# Patient Record
Sex: Female | Born: 1935
Health system: Southern US, Community
[De-identification: ages and names within clinical notes are randomized; demographics above are authoritative.]

## PROBLEM LIST (undated history)

## (undated) DIAGNOSIS — K5790 Diverticulosis of intestine, part unspecified, without perforation or abscess without bleeding: Secondary | ICD-10-CM

## (undated) DIAGNOSIS — T7840XA Allergy, unspecified, initial encounter: Secondary | ICD-10-CM

## (undated) DIAGNOSIS — M2550 Pain in unspecified joint: Secondary | ICD-10-CM

## (undated) DIAGNOSIS — R519 Headache, unspecified: Secondary | ICD-10-CM

## (undated) DIAGNOSIS — R350 Frequency of micturition: Secondary | ICD-10-CM

## (undated) DIAGNOSIS — R011 Cardiac murmur, unspecified: Secondary | ICD-10-CM

## (undated) DIAGNOSIS — M5030 Other cervical disc degeneration, unspecified cervical region: Secondary | ICD-10-CM

## (undated) DIAGNOSIS — K219 Gastro-esophageal reflux disease without esophagitis: Secondary | ICD-10-CM

## (undated) DIAGNOSIS — G4700 Insomnia, unspecified: Secondary | ICD-10-CM

## (undated) DIAGNOSIS — M549 Dorsalgia, unspecified: Secondary | ICD-10-CM

## (undated) DIAGNOSIS — G629 Polyneuropathy, unspecified: Secondary | ICD-10-CM

## (undated) DIAGNOSIS — I209 Angina pectoris, unspecified: Secondary | ICD-10-CM

## (undated) DIAGNOSIS — M51369 Other intervertebral disc degeneration, lumbar region without mention of lumbar back pain or lower extremity pain: Secondary | ICD-10-CM

## (undated) DIAGNOSIS — I10 Essential (primary) hypertension: Secondary | ICD-10-CM

## (undated) DIAGNOSIS — K589 Irritable bowel syndrome without diarrhea: Secondary | ICD-10-CM

## (undated) DIAGNOSIS — F419 Anxiety disorder, unspecified: Secondary | ICD-10-CM

## (undated) DIAGNOSIS — Z860100 Personal history of colon polyps, unspecified: Secondary | ICD-10-CM

## (undated) DIAGNOSIS — Z8711 Personal history of peptic ulcer disease: Secondary | ICD-10-CM

## (undated) DIAGNOSIS — M2540 Effusion, unspecified joint: Secondary | ICD-10-CM

## (undated) DIAGNOSIS — Z8709 Personal history of other diseases of the respiratory system: Secondary | ICD-10-CM

## (undated) DIAGNOSIS — F32A Depression, unspecified: Secondary | ICD-10-CM

## (undated) DIAGNOSIS — N898 Other specified noninflammatory disorders of vagina: Secondary | ICD-10-CM

## (undated) DIAGNOSIS — E039 Hypothyroidism, unspecified: Secondary | ICD-10-CM

## (undated) DIAGNOSIS — F329 Major depressive disorder, single episode, unspecified: Secondary | ICD-10-CM

## (undated) DIAGNOSIS — Z8719 Personal history of other diseases of the digestive system: Secondary | ICD-10-CM

## (undated) DIAGNOSIS — G8929 Other chronic pain: Secondary | ICD-10-CM

## (undated) DIAGNOSIS — R51 Headache: Secondary | ICD-10-CM

## (undated) DIAGNOSIS — R42 Dizziness and giddiness: Secondary | ICD-10-CM

## (undated) DIAGNOSIS — H269 Unspecified cataract: Secondary | ICD-10-CM

## (undated) DIAGNOSIS — Z972 Presence of dental prosthetic device (complete) (partial): Secondary | ICD-10-CM

## (undated) DIAGNOSIS — R3915 Urgency of urination: Secondary | ICD-10-CM

## (undated) DIAGNOSIS — R351 Nocturia: Secondary | ICD-10-CM

## (undated) DIAGNOSIS — M5136 Other intervertebral disc degeneration, lumbar region: Secondary | ICD-10-CM

## (undated) DIAGNOSIS — C801 Malignant (primary) neoplasm, unspecified: Secondary | ICD-10-CM

## (undated) HISTORY — PX: TONSILLECTOMY: SUR1361

## (undated) HISTORY — DX: Essential (primary) hypertension: I10

## (undated) HISTORY — DX: Hypothyroidism, unspecified: E03.9

## (undated) HISTORY — DX: Gastro-esophageal reflux disease without esophagitis: K21.9

## (undated) HISTORY — DX: Other specified noninflammatory disorders of vagina: N89.8

## (undated) HISTORY — PX: ESOPHAGOGASTRODUODENOSCOPY: SHX1529

## (undated) HISTORY — DX: Other intervertebral disc degeneration, lumbar region: M51.36

## (undated) HISTORY — DX: Other intervertebral disc degeneration, lumbar region without mention of lumbar back pain or lower extremity pain: M51.369

## (undated) HISTORY — DX: Allergy, unspecified, initial encounter: T78.40XA

## (undated) HISTORY — PX: OTHER SURGICAL HISTORY: SHX169

## (undated) HISTORY — DX: Anxiety disorder, unspecified: F41.9

## (undated) HISTORY — PX: HERNIA REPAIR: SHX51

## (undated) HISTORY — DX: Irritable bowel syndrome, unspecified: K58.9

## (undated) HISTORY — DX: Angina pectoris, unspecified: I20.9

## (undated) HISTORY — DX: Polyneuropathy, unspecified: G62.9

## (undated) HISTORY — DX: Malignant (primary) neoplasm, unspecified: C80.1

## (undated) HISTORY — DX: Other cervical disc degeneration, unspecified cervical region: M50.30

---
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
## 1961-04-07 HISTORY — PX: OVARIAN CYST SURGERY: SHX726

## 1966-04-07 HISTORY — PX: ABDOMINAL HYSTERECTOMY: SHX81

## 1998-09-26 HISTORY — PX: CARDIAC CATHETERIZATION: SHX172

## 1999-10-28 ENCOUNTER — Encounter: Payer: Self-pay | Admitting: Internal Medicine

## 1999-12-17 ENCOUNTER — Other Ambulatory Visit: Admission: RE | Admit: 1999-12-17 | Discharge: 1999-12-17 | Payer: Self-pay | Admitting: Family Medicine

## 1999-12-24 ENCOUNTER — Encounter: Payer: Self-pay | Admitting: Internal Medicine

## 2000-01-27 ENCOUNTER — Encounter: Payer: Self-pay | Admitting: Internal Medicine

## 2000-03-10 ENCOUNTER — Encounter: Payer: Self-pay | Admitting: Internal Medicine

## 2000-03-10 LAB — CONVERTED CEMR LAB: TSH: 7.08 microintl units/mL

## 2000-04-15 ENCOUNTER — Encounter: Payer: Self-pay | Admitting: Internal Medicine

## 2000-04-27 ENCOUNTER — Encounter: Payer: Self-pay | Admitting: Internal Medicine

## 2000-04-27 LAB — CONVERTED CEMR LAB: TSH: 1.896 microintl units/mL

## 2000-10-02 ENCOUNTER — Encounter: Payer: Self-pay | Admitting: Family Medicine

## 2000-10-22 ENCOUNTER — Encounter: Payer: Self-pay | Admitting: Internal Medicine

## 2000-10-22 LAB — CONVERTED CEMR LAB: Blood Glucose, Fasting: 92 mg/dL

## 2000-11-26 ENCOUNTER — Encounter: Payer: Self-pay | Admitting: Internal Medicine

## 2001-01-15 ENCOUNTER — Encounter: Payer: Self-pay | Admitting: Internal Medicine

## 2002-02-09 ENCOUNTER — Encounter: Payer: Self-pay | Admitting: Internal Medicine

## 2002-02-09 LAB — CONVERTED CEMR LAB: TSH: 0.81 microintl units/mL

## 2002-06-24 ENCOUNTER — Encounter: Payer: Self-pay | Admitting: Internal Medicine

## 2002-08-05 ENCOUNTER — Ambulatory Visit (HOSPITAL_COMMUNITY): Admission: RE | Admit: 2002-08-05 | Discharge: 2002-08-05 | Payer: Self-pay | Admitting: Internal Medicine

## 2002-08-05 ENCOUNTER — Encounter: Payer: Self-pay | Admitting: Internal Medicine

## 2002-09-07 ENCOUNTER — Encounter: Payer: Self-pay | Admitting: Internal Medicine

## 2003-02-27 ENCOUNTER — Other Ambulatory Visit: Payer: Self-pay

## 2003-12-15 ENCOUNTER — Encounter: Payer: Self-pay | Admitting: Internal Medicine

## 2003-12-15 LAB — CONVERTED CEMR LAB: Blood Glucose, Fasting: 93 mg/dL

## 2004-02-07 ENCOUNTER — Ambulatory Visit: Payer: Self-pay | Admitting: Anesthesiology

## 2004-03-19 ENCOUNTER — Ambulatory Visit: Payer: Self-pay | Admitting: Internal Medicine

## 2004-03-19 LAB — CONVERTED CEMR LAB: TSH: 0.68 microintl units/mL

## 2004-03-21 ENCOUNTER — Ambulatory Visit: Payer: Self-pay | Admitting: Internal Medicine

## 2004-06-05 HISTORY — PX: COLOSTOMY W/ RECTOCELE REPAIR: SUR278

## 2004-06-20 ENCOUNTER — Ambulatory Visit: Payer: Self-pay | Admitting: Anesthesiology

## 2004-09-24 ENCOUNTER — Ambulatory Visit: Payer: Self-pay | Admitting: Anesthesiology

## 2004-09-24 ENCOUNTER — Ambulatory Visit: Payer: Self-pay | Admitting: Internal Medicine

## 2004-12-05 ENCOUNTER — Ambulatory Visit: Payer: Self-pay | Admitting: Internal Medicine

## 2004-12-05 LAB — CONVERTED CEMR LAB: TSH: 0.56 microintl units/mL

## 2004-12-24 ENCOUNTER — Ambulatory Visit: Payer: Self-pay | Admitting: Anesthesiology

## 2005-02-06 ENCOUNTER — Ambulatory Visit: Payer: Self-pay | Admitting: Internal Medicine

## 2005-05-05 ENCOUNTER — Ambulatory Visit: Payer: Self-pay | Admitting: Internal Medicine

## 2005-06-11 ENCOUNTER — Ambulatory Visit: Payer: Self-pay | Admitting: Internal Medicine

## 2005-09-02 ENCOUNTER — Ambulatory Visit: Payer: Self-pay | Admitting: Internal Medicine

## 2005-09-02 LAB — CONVERTED CEMR LAB: TSH: 1.41 microintl units/mL

## 2005-10-31 ENCOUNTER — Ambulatory Visit: Payer: Self-pay | Admitting: Internal Medicine

## 2006-01-06 ENCOUNTER — Ambulatory Visit: Payer: Self-pay | Admitting: Internal Medicine

## 2006-04-28 ENCOUNTER — Encounter: Payer: Self-pay | Admitting: Internal Medicine

## 2006-05-05 ENCOUNTER — Ambulatory Visit: Payer: Self-pay | Admitting: Otolaryngology

## 2006-06-17 ENCOUNTER — Encounter: Payer: Self-pay | Admitting: Internal Medicine

## 2006-06-18 ENCOUNTER — Encounter: Payer: Self-pay | Admitting: Internal Medicine

## 2006-07-07 ENCOUNTER — Encounter: Payer: Self-pay | Admitting: Internal Medicine

## 2006-07-14 ENCOUNTER — Encounter: Payer: Self-pay | Admitting: Internal Medicine

## 2006-07-16 ENCOUNTER — Encounter: Payer: Self-pay | Admitting: Internal Medicine

## 2006-09-08 ENCOUNTER — Encounter: Payer: Self-pay | Admitting: Internal Medicine

## 2006-10-06 ENCOUNTER — Encounter: Payer: Self-pay | Admitting: Internal Medicine

## 2006-11-09 ENCOUNTER — Telehealth (INDEPENDENT_AMBULATORY_CARE_PROVIDER_SITE_OTHER): Payer: Self-pay | Admitting: *Deleted

## 2007-01-07 ENCOUNTER — Ambulatory Visit: Payer: Self-pay | Admitting: Internal Medicine

## 2007-02-11 ENCOUNTER — Telehealth (INDEPENDENT_AMBULATORY_CARE_PROVIDER_SITE_OTHER): Payer: Self-pay | Admitting: *Deleted

## 2007-02-16 ENCOUNTER — Emergency Department: Payer: Self-pay | Admitting: Unknown Physician Specialty

## 2007-02-16 ENCOUNTER — Other Ambulatory Visit: Payer: Self-pay

## 2007-02-16 ENCOUNTER — Telehealth: Payer: Self-pay | Admitting: Internal Medicine

## 2007-02-17 ENCOUNTER — Telehealth: Payer: Self-pay | Admitting: Internal Medicine

## 2007-02-23 ENCOUNTER — Ambulatory Visit: Payer: Self-pay | Admitting: Internal Medicine

## 2007-02-23 DIAGNOSIS — M8580 Other specified disorders of bone density and structure, unspecified site: Secondary | ICD-10-CM | POA: Insufficient documentation

## 2007-02-23 DIAGNOSIS — K2100 Gastro-esophageal reflux disease with esophagitis, without bleeding: Secondary | ICD-10-CM | POA: Insufficient documentation

## 2007-02-23 DIAGNOSIS — J309 Allergic rhinitis, unspecified: Secondary | ICD-10-CM | POA: Insufficient documentation

## 2007-02-23 DIAGNOSIS — F411 Generalized anxiety disorder: Secondary | ICD-10-CM | POA: Insufficient documentation

## 2007-02-23 DIAGNOSIS — I10 Essential (primary) hypertension: Secondary | ICD-10-CM | POA: Insufficient documentation

## 2007-02-23 DIAGNOSIS — E039 Hypothyroidism, unspecified: Secondary | ICD-10-CM | POA: Insufficient documentation

## 2007-02-26 ENCOUNTER — Encounter: Payer: Self-pay | Admitting: Internal Medicine

## 2007-03-17 ENCOUNTER — Encounter: Admission: RE | Admit: 2007-03-17 | Discharge: 2007-03-17 | Payer: Self-pay | Admitting: Internal Medicine

## 2007-03-19 ENCOUNTER — Encounter (INDEPENDENT_AMBULATORY_CARE_PROVIDER_SITE_OTHER): Payer: Self-pay | Admitting: *Deleted

## 2007-03-25 ENCOUNTER — Ambulatory Visit: Payer: Self-pay | Admitting: Internal Medicine

## 2007-03-26 LAB — CONVERTED CEMR LAB
CO2: 31 meq/L (ref 19–32)
Eosinophils Absolute: 0.1 10*3/uL (ref 0.0–0.6)
Eosinophils Relative: 2.6 % (ref 0.0–5.0)
Glucose, Bld: 76 mg/dL (ref 70–99)
HCT: 38.5 % (ref 36.0–46.0)
Lymphocytes Relative: 45.1 % (ref 12.0–46.0)
MCHC: 34.2 g/dL (ref 30.0–36.0)
MCV: 93.4 fL (ref 78.0–100.0)
Neutro Abs: 1.7 10*3/uL (ref 1.4–7.7)
Neutrophils Relative %: 41.6 % — ABNORMAL LOW (ref 43.0–77.0)
Phosphorus: 3.4 mg/dL (ref 2.3–4.6)
Platelets: 283 10*3/uL (ref 150–400)
Potassium: 3.9 meq/L (ref 3.5–5.1)
WBC: 4.2 10*3/uL — ABNORMAL LOW (ref 4.5–10.5)

## 2007-04-08 HISTORY — PX: US ECHOCARDIOGRAPHY: HXRAD669

## 2007-04-08 HISTORY — PX: NM MYOVIEW LTD: HXRAD82

## 2007-04-12 ENCOUNTER — Telehealth (INDEPENDENT_AMBULATORY_CARE_PROVIDER_SITE_OTHER): Payer: Self-pay | Admitting: *Deleted

## 2007-04-26 ENCOUNTER — Ambulatory Visit: Payer: Self-pay | Admitting: Gynecology

## 2007-04-29 ENCOUNTER — Telehealth (INDEPENDENT_AMBULATORY_CARE_PROVIDER_SITE_OTHER): Payer: Self-pay | Admitting: *Deleted

## 2007-05-10 ENCOUNTER — Ambulatory Visit: Payer: Self-pay | Admitting: Gynecology

## 2007-05-11 ENCOUNTER — Telehealth: Payer: Self-pay | Admitting: Internal Medicine

## 2007-05-19 ENCOUNTER — Telehealth (INDEPENDENT_AMBULATORY_CARE_PROVIDER_SITE_OTHER): Payer: Self-pay | Admitting: *Deleted

## 2007-05-21 ENCOUNTER — Ambulatory Visit: Payer: Self-pay | Admitting: Internal Medicine

## 2007-05-25 ENCOUNTER — Ambulatory Visit: Payer: Self-pay | Admitting: Internal Medicine

## 2007-06-18 ENCOUNTER — Ambulatory Visit: Payer: Self-pay | Admitting: Internal Medicine

## 2007-06-24 ENCOUNTER — Encounter: Payer: Self-pay | Admitting: Internal Medicine

## 2007-06-24 ENCOUNTER — Ambulatory Visit: Payer: Self-pay

## 2007-08-05 ENCOUNTER — Telehealth: Payer: Self-pay | Admitting: Internal Medicine

## 2007-08-06 ENCOUNTER — Telehealth: Payer: Self-pay | Admitting: Internal Medicine

## 2007-08-16 ENCOUNTER — Ambulatory Visit: Payer: Self-pay | Admitting: Internal Medicine

## 2007-08-16 DIAGNOSIS — K589 Irritable bowel syndrome without diarrhea: Secondary | ICD-10-CM | POA: Insufficient documentation

## 2007-08-16 DIAGNOSIS — K219 Gastro-esophageal reflux disease without esophagitis: Secondary | ICD-10-CM | POA: Insufficient documentation

## 2007-08-17 ENCOUNTER — Telehealth: Payer: Self-pay | Admitting: Internal Medicine

## 2007-08-18 ENCOUNTER — Telehealth: Payer: Self-pay | Admitting: Internal Medicine

## 2007-08-18 ENCOUNTER — Encounter: Payer: Self-pay | Admitting: Internal Medicine

## 2007-08-18 LAB — HM COLONOSCOPY: HM Colonoscopy: NORMAL

## 2007-08-26 ENCOUNTER — Ambulatory Visit: Payer: Self-pay | Admitting: Internal Medicine

## 2007-08-26 DIAGNOSIS — Z888 Allergy status to other drugs, medicaments and biological substances status: Secondary | ICD-10-CM | POA: Insufficient documentation

## 2007-09-28 ENCOUNTER — Ambulatory Visit: Payer: Self-pay | Admitting: Internal Medicine

## 2007-09-28 DIAGNOSIS — G609 Hereditary and idiopathic neuropathy, unspecified: Secondary | ICD-10-CM | POA: Insufficient documentation

## 2007-09-29 LAB — CONVERTED CEMR LAB
Albumin: 4.1 g/dL (ref 3.5–5.2)
Basophils Absolute: 0 10*3/uL (ref 0.0–0.1)
Calcium: 9.9 mg/dL (ref 8.4–10.5)
GFR calc Af Amer: 106 mL/min
GFR calc non Af Amer: 88 mL/min
Glucose, Bld: 99 mg/dL (ref 70–99)
HCT: 39.7 % (ref 36.0–46.0)
Hemoglobin: 13.8 g/dL (ref 12.0–15.0)
MCHC: 34.8 g/dL (ref 30.0–36.0)
MCV: 91 fL (ref 78.0–100.0)
Monocytes Absolute: 0.5 10*3/uL (ref 0.1–1.0)
Neutro Abs: 1.8 10*3/uL (ref 1.4–7.7)
RDW: 12.3 % (ref 11.5–14.6)
Sodium: 138 meq/L (ref 135–145)

## 2007-10-04 ENCOUNTER — Ambulatory Visit: Payer: Self-pay | Admitting: Internal Medicine

## 2007-10-12 ENCOUNTER — Telehealth (INDEPENDENT_AMBULATORY_CARE_PROVIDER_SITE_OTHER): Payer: Self-pay | Admitting: *Deleted

## 2007-11-03 ENCOUNTER — Encounter: Payer: Self-pay | Admitting: Internal Medicine

## 2007-12-06 ENCOUNTER — Ambulatory Visit: Payer: Self-pay | Admitting: Gynecology

## 2008-01-03 ENCOUNTER — Ambulatory Visit: Payer: Self-pay | Admitting: Internal Medicine

## 2008-01-05 ENCOUNTER — Telehealth: Payer: Self-pay | Admitting: Internal Medicine

## 2008-01-06 ENCOUNTER — Telehealth: Payer: Self-pay | Admitting: Internal Medicine

## 2008-01-21 ENCOUNTER — Ambulatory Visit: Payer: Self-pay | Admitting: Internal Medicine

## 2008-03-07 ENCOUNTER — Ambulatory Visit: Payer: Self-pay | Admitting: Internal Medicine

## 2008-03-07 DIAGNOSIS — K4090 Unilateral inguinal hernia, without obstruction or gangrene, not specified as recurrent: Secondary | ICD-10-CM | POA: Insufficient documentation

## 2008-03-14 ENCOUNTER — Telehealth: Payer: Self-pay | Admitting: Internal Medicine

## 2008-04-04 ENCOUNTER — Encounter: Admission: RE | Admit: 2008-04-04 | Discharge: 2008-04-04 | Payer: Self-pay | Admitting: Gynecology

## 2008-06-01 ENCOUNTER — Telehealth: Payer: Self-pay | Admitting: Internal Medicine

## 2008-06-22 ENCOUNTER — Telehealth: Payer: Self-pay | Admitting: Internal Medicine

## 2008-06-23 ENCOUNTER — Encounter: Payer: Self-pay | Admitting: Internal Medicine

## 2008-06-27 ENCOUNTER — Telehealth: Payer: Self-pay | Admitting: Internal Medicine

## 2008-06-29 ENCOUNTER — Ambulatory Visit: Payer: Self-pay | Admitting: Internal Medicine

## 2008-08-05 HISTORY — PX: VENTRAL HERNIA REPAIR: SHX424

## 2008-08-16 ENCOUNTER — Telehealth: Payer: Self-pay | Admitting: Internal Medicine

## 2008-08-25 ENCOUNTER — Inpatient Hospital Stay (HOSPITAL_COMMUNITY): Admission: RE | Admit: 2008-08-25 | Discharge: 2008-08-30 | Payer: Self-pay | Admitting: Surgery

## 2008-09-14 ENCOUNTER — Encounter: Payer: Self-pay | Admitting: Internal Medicine

## 2008-09-27 ENCOUNTER — Ambulatory Visit: Payer: Self-pay | Admitting: Internal Medicine

## 2008-09-29 ENCOUNTER — Ambulatory Visit (HOSPITAL_COMMUNITY): Admission: RE | Admit: 2008-09-29 | Discharge: 2008-09-29 | Payer: Self-pay | Admitting: Internal Medicine

## 2008-10-10 ENCOUNTER — Telehealth: Payer: Self-pay | Admitting: Internal Medicine

## 2008-10-20 ENCOUNTER — Ambulatory Visit (HOSPITAL_COMMUNITY): Admission: RE | Admit: 2008-10-20 | Discharge: 2008-10-20 | Payer: Self-pay | Admitting: Internal Medicine

## 2008-10-26 ENCOUNTER — Telehealth: Payer: Self-pay | Admitting: Internal Medicine

## 2008-10-26 ENCOUNTER — Ambulatory Visit: Payer: Self-pay | Admitting: Internal Medicine

## 2008-11-03 ENCOUNTER — Ambulatory Visit: Payer: Self-pay | Admitting: Internal Medicine

## 2008-11-05 LAB — CONVERTED CEMR LAB
ALT: 17 units/L (ref 0–35)
AST: 25 units/L (ref 0–37)
Albumin: 4.5 g/dL (ref 3.5–5.2)
Basophils Relative: 0 % (ref 0.0–3.0)
Chloride: 100 meq/L (ref 96–112)
Eosinophils Relative: 1.6 % (ref 0.0–5.0)
Free T4: 1.3 ng/dL (ref 0.6–1.6)
HCT: 37.9 % (ref 36.0–46.0)
Hemoglobin: 13.2 g/dL (ref 12.0–15.0)
Lymphs Abs: 1.7 10*3/uL (ref 0.7–4.0)
Monocytes Relative: 9.3 % (ref 3.0–12.0)
Neutro Abs: 1.2 10*3/uL — ABNORMAL LOW (ref 1.4–7.7)
Phosphorus: 3.5 mg/dL (ref 2.3–4.6)
Potassium: 3.5 meq/L (ref 3.5–5.1)
RBC: 4.09 M/uL (ref 3.87–5.11)
RDW: 12.9 % (ref 11.5–14.6)
Total Protein: 7.7 g/dL (ref 6.0–8.3)

## 2008-11-15 ENCOUNTER — Encounter: Payer: Self-pay | Admitting: Internal Medicine

## 2008-11-29 ENCOUNTER — Encounter: Payer: Self-pay | Admitting: Internal Medicine

## 2008-11-29 ENCOUNTER — Encounter: Admission: RE | Admit: 2008-11-29 | Discharge: 2008-11-29 | Payer: Self-pay | Admitting: Obstetrics and Gynecology

## 2008-12-06 ENCOUNTER — Encounter: Payer: Self-pay | Admitting: Internal Medicine

## 2008-12-18 ENCOUNTER — Ambulatory Visit: Payer: Self-pay | Admitting: Internal Medicine

## 2008-12-19 LAB — CONVERTED CEMR LAB
Free T4: 0.9 ng/dL (ref 0.6–1.6)
TSH: 1.16 microintl units/mL (ref 0.35–5.50)

## 2009-01-04 ENCOUNTER — Ambulatory Visit: Payer: Self-pay | Admitting: Internal Medicine

## 2009-01-18 ENCOUNTER — Telehealth: Payer: Self-pay | Admitting: Internal Medicine

## 2009-03-02 ENCOUNTER — Telehealth: Payer: Self-pay | Admitting: Internal Medicine

## 2009-03-05 ENCOUNTER — Ambulatory Visit: Payer: Self-pay | Admitting: Gastroenterology

## 2009-03-05 DIAGNOSIS — R1084 Generalized abdominal pain: Secondary | ICD-10-CM | POA: Insufficient documentation

## 2009-03-05 DIAGNOSIS — K5900 Constipation, unspecified: Secondary | ICD-10-CM | POA: Insufficient documentation

## 2009-03-05 DIAGNOSIS — R197 Diarrhea, unspecified: Secondary | ICD-10-CM | POA: Insufficient documentation

## 2009-03-07 ENCOUNTER — Encounter: Payer: Self-pay | Admitting: Physician Assistant

## 2009-03-08 LAB — CONVERTED CEMR LAB
BUN: 7 mg/dL (ref 6–23)
Basophils Relative: 1.3 % (ref 0.0–3.0)
Calcium: 9.5 mg/dL (ref 8.4–10.5)
Creatinine, Ser: 0.7 mg/dL (ref 0.4–1.2)
Eosinophils Absolute: 0.1 10*3/uL (ref 0.0–0.7)
Eosinophils Relative: 1.8 % (ref 0.0–5.0)
Lymphocytes Relative: 45.2 % (ref 12.0–46.0)
Neutrophils Relative %: 42.7 % — ABNORMAL LOW (ref 43.0–77.0)
Platelets: 299 10*3/uL (ref 150.0–400.0)
RBC: 4.16 M/uL (ref 3.87–5.11)
WBC: 4.3 10*3/uL — ABNORMAL LOW (ref 4.5–10.5)

## 2009-04-05 ENCOUNTER — Encounter: Admission: RE | Admit: 2009-04-05 | Discharge: 2009-04-05 | Payer: Self-pay | Admitting: Internal Medicine

## 2009-04-05 LAB — HM MAMMOGRAPHY: HM Mammogram: NORMAL

## 2009-04-16 ENCOUNTER — Encounter: Payer: Self-pay | Admitting: Internal Medicine

## 2009-05-07 ENCOUNTER — Ambulatory Visit: Payer: Self-pay | Admitting: Internal Medicine

## 2009-05-31 ENCOUNTER — Telehealth: Payer: Self-pay | Admitting: Internal Medicine

## 2009-06-07 ENCOUNTER — Ambulatory Visit: Payer: Self-pay | Admitting: Internal Medicine

## 2009-06-07 DIAGNOSIS — N3020 Other chronic cystitis without hematuria: Secondary | ICD-10-CM | POA: Insufficient documentation

## 2009-06-07 LAB — CONVERTED CEMR LAB
Bilirubin Urine: NEGATIVE
Glucose, Urine, Semiquant: NEGATIVE
Ketones, urine, test strip: NEGATIVE
Protein, U semiquant: NEGATIVE
Specific Gravity, Urine: <1.005
Urobilinogen, UA: 0.2

## 2009-07-06 HISTORY — PX: COLONOSCOPY: SHX174

## 2009-07-11 ENCOUNTER — Telehealth: Payer: Self-pay | Admitting: Internal Medicine

## 2009-07-18 ENCOUNTER — Encounter: Payer: Self-pay | Admitting: Internal Medicine

## 2009-07-19 ENCOUNTER — Telehealth: Payer: Self-pay | Admitting: Internal Medicine

## 2009-08-16 ENCOUNTER — Ambulatory Visit: Payer: Self-pay | Admitting: Internal Medicine

## 2009-08-20 ENCOUNTER — Encounter: Payer: Self-pay | Admitting: Internal Medicine

## 2009-08-20 LAB — CONVERTED CEMR LAB
AST: 24 units/L (ref 0–37)
Albumin: 4.3 g/dL (ref 3.5–5.2)
Alkaline Phosphatase: 49 units/L (ref 39–117)
BUN: 8 mg/dL (ref 6–23)
Basophils Absolute: 0 10*3/uL (ref 0.0–0.1)
Bilirubin, Direct: 0 mg/dL (ref 0.0–0.3)
Chloride: 107 meq/L (ref 96–112)
Eosinophils Absolute: 0.1 10*3/uL (ref 0.0–0.7)
Free T4: 1 ng/dL (ref 0.6–1.6)
GFR calc non Af Amer: 96.65 mL/min (ref >60)
Glucose, Bld: 95 mg/dL (ref 70–99)
Hemoglobin: 12.9 g/dL (ref 12.0–15.0)
Lymphocytes Relative: 46 % (ref 12.0–46.0)
MCHC: 34.6 g/dL (ref 30.0–36.0)
Monocytes Relative: 7.5 % (ref 3.0–12.0)
Neutro Abs: 1.8 10*3/uL (ref 1.4–7.7)
Neutrophils Relative %: 43 % (ref 43.0–77.0)
Phosphorus: 3.2 mg/dL (ref 2.3–4.6)
Platelets: 301 10*3/uL (ref 150.0–400.0)
RDW: 14.3 % (ref 11.5–14.6)
TSH: 2.47 microintl units/mL (ref 0.35–5.50)
Total Bilirubin: 0.6 mg/dL (ref 0.3–1.2)

## 2009-08-27 ENCOUNTER — Encounter: Payer: Self-pay | Admitting: Internal Medicine

## 2009-09-07 ENCOUNTER — Telehealth: Payer: Self-pay | Admitting: Family Medicine

## 2009-09-10 ENCOUNTER — Telehealth: Payer: Self-pay | Admitting: Internal Medicine

## 2009-09-13 ENCOUNTER — Telehealth: Payer: Self-pay | Admitting: Internal Medicine

## 2009-09-19 ENCOUNTER — Telehealth: Payer: Self-pay | Admitting: Internal Medicine

## 2009-09-19 ENCOUNTER — Ambulatory Visit: Payer: Self-pay | Admitting: Internal Medicine

## 2009-09-19 DIAGNOSIS — R51 Headache: Secondary | ICD-10-CM | POA: Insufficient documentation

## 2009-09-19 DIAGNOSIS — R519 Headache, unspecified: Secondary | ICD-10-CM | POA: Insufficient documentation

## 2009-09-21 ENCOUNTER — Telehealth: Payer: Self-pay | Admitting: Internal Medicine

## 2009-09-25 ENCOUNTER — Encounter: Payer: Self-pay | Admitting: Internal Medicine

## 2009-10-15 ENCOUNTER — Encounter: Payer: Self-pay | Admitting: Internal Medicine

## 2009-10-18 ENCOUNTER — Encounter: Payer: Self-pay | Admitting: Internal Medicine

## 2009-10-24 ENCOUNTER — Ambulatory Visit: Payer: Self-pay | Admitting: Pain Medicine

## 2009-10-29 ENCOUNTER — Encounter: Payer: Self-pay | Admitting: Internal Medicine

## 2009-10-29 ENCOUNTER — Ambulatory Visit: Payer: Self-pay | Admitting: Pain Medicine

## 2009-11-08 ENCOUNTER — Ambulatory Visit: Payer: Self-pay | Admitting: Pain Medicine

## 2009-11-12 ENCOUNTER — Encounter: Payer: Self-pay | Admitting: Internal Medicine

## 2009-11-13 ENCOUNTER — Ambulatory Visit: Payer: Self-pay | Admitting: Pain Medicine

## 2009-11-13 ENCOUNTER — Telehealth: Payer: Self-pay | Admitting: Internal Medicine

## 2009-11-28 ENCOUNTER — Ambulatory Visit: Payer: Self-pay | Admitting: Pain Medicine

## 2009-12-25 ENCOUNTER — Ambulatory Visit: Payer: Self-pay | Admitting: Internal Medicine

## 2009-12-26 ENCOUNTER — Encounter: Payer: Self-pay | Admitting: Internal Medicine

## 2010-01-22 ENCOUNTER — Encounter: Payer: Self-pay | Admitting: Internal Medicine

## 2010-01-22 ENCOUNTER — Ambulatory Visit: Payer: Self-pay | Admitting: Internal Medicine

## 2010-01-22 DIAGNOSIS — R079 Chest pain, unspecified: Secondary | ICD-10-CM | POA: Insufficient documentation

## 2010-01-23 LAB — CONVERTED CEMR LAB
Albumin: 4 g/dL (ref 3.5–5.2)
Basophils Relative: 0.9 % (ref 0.0–3.0)
CO2: 31 meq/L (ref 19–32)
Calcium: 9.6 mg/dL (ref 8.4–10.5)
Creatinine, Ser: 0.7 mg/dL (ref 0.4–1.2)
Eosinophils Absolute: 0.1 10*3/uL (ref 0.0–0.7)
Eosinophils Relative: 1.1 % (ref 0.0–5.0)
Glucose, Bld: 85 mg/dL (ref 70–99)
HCT: 37 % (ref 36.0–46.0)
Lymphs Abs: 2 10*3/uL (ref 0.7–4.0)
MCHC: 34.5 g/dL (ref 30.0–36.0)
MCV: 93.2 fL (ref 78.0–100.0)
Monocytes Absolute: 0.4 10*3/uL (ref 0.1–1.0)
Neutrophils Relative %: 45.8 % (ref 43.0–77.0)
Platelets: 292 10*3/uL (ref 150.0–400.0)
Sodium: 139 meq/L (ref 135–145)
WBC: 4.5 10*3/uL (ref 4.5–10.5)

## 2010-05-07 NOTE — Progress Notes (Signed)
Summary: flonas  Phone Note Refill Request Message from:  [employer] on November 13, 2009 12:57 PM  Refills Requested: Medication #1:  flonas   Supply Requested: 1 month not on medication list    Method Requested: Electronic Initial call taken by: Benny Lennert CMA Duncan Dull),  November 13, 2009 12:57 PM  Follow-up for Phone Call        she was on this and it was removed from list in May okay to refill x 1 year 2 sprays each nostril dialy fluticasone (the generic) Follow-up by: Cindee Salt MD,  November 13, 2009 1:39 PM  Additional Follow-up for Phone Call Additional follow up Details #1::        rx sent to pharmacy and med list updated Additional Follow-up by: Benny Lennert CMA Duncan Dull),  November 13, 2009 2:40 PM    New/Updated Medications: FLONASE 50 MCG/ACT SUSP (FLUTICASONE PROPIONATE) 2 sprays in each nostril daily Prescriptions: FLONASE 50 MCG/ACT SUSP (FLUTICASONE PROPIONATE) 2 sprays in each nostril daily  #1 x 11   Entered by:   Benny Lennert CMA (AAMA)   Authorized by:   Cindee Salt MD   Signed by:   Benny Lennert CMA (AAMA) on 11/13/2009   Method used:   Electronically to        [facility]. [address] [phone]* (retail)       [address] [place], [place]  [id number]       Ph: [phone]       Fax: [phone]   RxID:   [id number]   Prior Medications: BUSPAR 15 MG TABS (BUSPIRONE HCL) Take 1 tablet by mouth two times a day GABAPENTIN 300 MG CAPS (GABAPENTIN) take 2 tablet in AM and PM, 1 tab in midday PANTOPRAZOLE SODIUM 40 MG  TBEC (PANTOPRAZOLE SODIUM) 1 two times a day LOMOTIL 2.5-0.025 MG  TABS (DIPHENOXYLATE-ATROPINE) take 1 tablet before meals as needed ESTRACE 0.5 MG TABS (ESTRADIOL) 1 tablet by mouth once daily AMLODIPINE BESYLATE 5 MG TABS (AMLODIPINE BESYLATE) Take one tablet by mouth daily FEXOFENADINE HCL 180 MG TABS (FEXOFENADINE HCL) take 1 tablet by mouth once daily LEVOXYL 75 MCG TABS (LEVOTHYROXINE SODIUM) take 1 by  mouth once daily BENTYL 10 MG CAPS (DICYCLOMINE HCL) Take 1 tab 3-4 times daily for bloating, cramping, spasms TRIAMTERENE-HCTZ 75-50 MG TABS (TRIAMTERENE-HCTZ) take 1/2 by mouth once daily CENTRUM SILVER   TABS (MULTIPLE VITAMINS-MINERALS) Take 1 tablet by mouth once a day CALCIUM-VITAMIN D 250-125 MG-UNIT TABS (CALCIUM CARBONATE-VITAMIN D) take 1 by mouth once daily GI COCKTAIL () 10 CC by mouth every 4 hours as needed for pain/esophageal burning RANITIDINE HCL 150 MG CAPS (RANITIDINE HCL) take 1 by mouth two times a day ALIGN  CAPS (MISC INTESTINAL FLORA REGULAT) 1 by mouth once daily CHOLESTYRAMINE 4 GM PACK (CHOLESTYRAMINE) take 1 small scoop in the a.m. VITAMIN B-12 1000 MCG  TABS (CYANOCOBALAMIN) take 1 tablet once a day by mouth IMODIUM ADVANCED 2-125 MG  TABS (LOPERAMIDE-SIMETHICONE) as needed TYLENOL 325 MG  TABS (ACETAMINOPHEN) as needed ASTRAGALUS ROOT  POWD (TRAGACANTH) 500 mg daily by mouth MECLIZINE HCL 25 MG TABS (MECLIZINE HCL) 1 tab by mouth three times a day for headache and balance problems Current Allergies: ! * METRONIDAZOLE ACEON CODEINE PHOSPHATE (CODEINE PHOSPHATE) * PREDNISONE AMOXICILLIN (AMOXICILLIN) TRAMADOL HCL (TRAMADOL HCL) TRILEPTAL (OXCARBAZEPINE)

## 2010-05-07 NOTE — Letter (Signed)
[facility]   Imported By: Lanelle Bal 01/10/2010 13:51:23  _____________________________________________________________________  External Attachment:    Type:   Image     Comment:   External Document  Appended Document: [facility] diarrhea  better reflux worse--regimen intensified and planning EGD

## 2010-05-07 NOTE — Progress Notes (Signed)
Summary: change maxzide dose  Phone Note From Pharmacy   Caller: [facility] [address] #[phone] Summary of Call: Pharmacy is asking to change pts maxzide dose to 75/50, current dose of 37.5/25 is not available. Initial call taken by: Lowella Petties CMA,  September 13, 2009 12:57 PM  Follow-up for Phone Call        okay to change to higher dose 1/2 tab daily Follow-up by: Cindee Salt MD,  September 13, 2009 12:59 PM  Additional Follow-up for Phone Call Additional follow up Details #1::        Prescription resent Additional Follow-up by: DeShannon Katrinka Blazing CMA Duncan Dull),  September 13, 2009 4:01 PM    New/Updated Medications: TRIAMTERENE-HCTZ 75-50 MG TABS (TRIAMTERENE-HCTZ) take 1/2 by mouth once daily Prescriptions: TRIAMTERENE-HCTZ 75-50 MG TABS (TRIAMTERENE-HCTZ) take 1/2 by mouth once daily  #30 x 6   Entered by:   Mervin Hack CMA (AAMA)   Authorized by:   Cindee Salt MD   Signed by:   Mervin Hack CMA (AAMA) on 09/13/2009   Method used:   Electronically to        [facility]. [address] [phone]* (retail)       [address] [place], [place]  [address]       Ph: [phone]       Fax: [phone]   RxID:   [id number]

## 2010-05-07 NOTE — Miscellaneous (Signed)
  Clinical Lists Changes  Medications: Changed medication from AMLODIPINE BESYLATE 10 MG TABS (AMLODIPINE BESYLATE) 1/2  tablet by mouth once daily to AMLODIPINE BESYLATE 5 MG TABS (AMLODIPINE BESYLATE) Take one tablet by mouth daily - Signed Rx of AMLODIPINE BESYLATE 5 MG TABS (AMLODIPINE BESYLATE) Take one tablet by mouth daily;  #30 x 12;  Signed;  Entered by: Kem Parkinson;  Authorized by: Sherrill Raring, MD, [facility];  Method used: Electronically to [facility]. [facility] [phone]*, [address] [place], [place]  [address], Ph: [phone], Fax: [phone]    Prescriptions: AMLODIPINE BESYLATE 5 MG TABS (AMLODIPINE BESYLATE) Take one tablet by mouth daily  #30 x 12   Entered by:   Kem Parkinson   Authorized by:   Sherrill Raring, MD, [facility]   Signed by:   Kem Parkinson on 08/20/2009   Method used:   Electronically to        [facility]. [address] [phone]* (retail)       [address] [place], [place]  [address]       Ph: [phone]       Fax: [phone]   RxID:   [id number]

## 2010-05-07 NOTE — Assessment & Plan Note (Signed)
Summary: 2:00 ?URI/CLE   Vital Signs:  Patient profile:   76 year old female Weight:      144 pounds Temp:     98.2 degrees F oral Resp:     18 per minute BP sitting:   122 / 62  (left arm) Cuff size:   regular  Vitals Entered By: Mervin Hack CMA Duncan Dull) (May 07, 2009 2:06 PM) CC: cold   History of Present Illness: Having "bronchial" bug again (every January) Started with sore throat 1 week ago Cough with deep green sputum seems to be from chest Mild fever but no sweats or shakes at night Really bad night 2 nights ago No SOB  some nasal drainage--with PND No ear pain but they itch  Trying mucinex DM and nasal spray and zinc----?some help  Allergies: 1)  ! * Metronidazole 2)  Aceon (Perindopril Erbumine) 3)  Codeine Phosphate (Codeine Phosphate) 4)  * Prednisone 5)  Amoxicillin (Amoxicillin) 6)  Tramadol Hcl (Tramadol Hcl) 7)  Trileptal (Oxcarbazepine)  Past History:  Past medical, surgical, family and social histories (including risk factors) reviewed for relevance to current acute and chronic problems.  Past Medical History: Reviewed history from 09/27/2008 and no changes required. Allergic rhinitis Anxiety Hypertension Osteopenia Irritable bowel syndrome Hypothyroidism Gastritis GERD  Past Surgical History: Reviewed history from 03/05/2009 and no changes required. T&A:(1943) APPENDECTOMY:(1948) CYSTS ON OVARIES:(1963) HYSTERECTOMY / LEFT OVARY REMAINS /ENDOMETRIOSIS:(1968) HEART CATH (SUBCRITICAL CAD):(09/26/1998) BX. DYSPLASIA /CERVICAL CUFF/ 11/09/1998 DYSPLASIA LASER SURGERY:12/03/1998 DEXA : OSTEOPENIA:(01/2000) ECHO; TRACE MR:( 12/2000) EGD:(06/2002) LUMBAR SYMPATHETIC BLOCK RECTUM HERNIA REPAIR (03/2005) CYSTOCELE/RECTOCELE REPAIR:(06/2004) ABDOMINAL WALL VENTRAL HERNIA REPAIR AND LYSIS OF ADHESIONS 5/10  Family History: Reviewed history from 01/21/2008 and no changes required. Dad :Suicide Mom died @94 :COPD Brother died of 2  strokes, MI No HTN, DM No breast or colon cancer No FH of Colon Cancer:  Social History: Reviewed history from 08/16/2007 and no changes required. Widowed 1997 1 daughter, 4 sons Former Smoker Alcohol use-occ Daily Caffeine Use 6 cups qd  Review of Systems       Slight loose stools Nausea last night after eating soup---still able to eat some though Slept well last night with 3 benedryl  Physical Exam  General:  alert.  NAD Head:  no frontal or maxillary tenderness Ears:  R ear normal and L ear normal.   Nose:  moderate congestion and inflammation Mouth:  no erythema and no exudates.   Neck:  supple, no masses, and no cervical lymphadenopathy.   Lungs:  normal respiratory effort, no intercostal retractions, no accessory muscle use, normal breath sounds, no crackles, and no wheezes.     Impression & Recommendations:  Problem # 1:  BRONCHITIS- ACUTE (ICD-466.0) Assessment New seems to be bacterial with purulent mucus has trouble with most antibiotics  except sulfa will try this analgesics  only  Her updated medication list for this problem includes:    Sulfamethoxazole-tmp Ds 800-160 Mg Tabs (Sulfamethoxazole-trimethoprim) .Marland Kitchen... 2 tabs at onset of bladder symptoms    Sulfamethoxazole-tmp Ds 800-160 Mg Tabs (Sulfamethoxazole-trimethoprim) .Marland Kitchen... 1 tab by mouth two times a day for bronchitis  Complete Medication List: 1)  Buspar 15 Mg Tabs (Buspirone hcl) .... Take 1 tablet by mouth two times a day 2)  Gabapentin 300 Mg Caps (Gabapentin) .... Take 2 tablet two times a day by mouth 3)  Vitamin B-12 1000 Mcg Tabs (Cyanocobalamin) .... Take 1 tablet once a day by mouth 4)  Pantoprazole Sodium 40 Mg Tbec (Pantoprazole sodium) .Marland KitchenMarland KitchenMarland Kitchen 1  two times a day 5)  Sulfamethoxazole-tmp Ds 800-160 Mg Tabs (Sulfamethoxazole-trimethoprim) .... 2 tabs at onset of bladder symptoms 6)  Triamterene-hctz 37.5-25 Mg Tabs (Triamterene-hctz) .... Take 1 tablet by mouth once a day 7)  Centrum Silver  Tabs (Multiple vitamins-minerals) .... Take 1 tablet by mouth once a day 8)  Lomotil 2.5-0.025 Mg Tabs (Diphenoxylate-atropine) .... Take 1 tablet before meals as needed 9)  Imodium Advanced 2-125 Mg Tabs (Loperamide-simethicone) .... As needed 10)  Tylenol 325 Mg Tabs (Acetaminophen) .... As needed 11)  Gi Cocktail  .Marland Kitchen.. 10 cc by mouth q 4 hours as needed for pain/esophageal burning 12)  Estrace 0.5 Mg Tabs (Estradiol) .Marland Kitchen.. 1 tablet by mouth once daily 13)  Benadryl 25 Mg Caps (Diphenhydramine hcl) .Marland Kitchen.. 1 capsule by mouth at bedtime 14)  Amlodipine Besylate 10 Mg Tabs (Amlodipine besylate) .Marland Kitchen.. 1 tablet by mouth once daily 15)  Align Caps (Misc intestinal flora regulat) .Marland Kitchen.. 1 by mouth once daily 16)  Fexofenadine Hcl 180 Mg Tabs (Fexofenadine hcl) .... Take 1 tablet by mouth once daily 17)  Astragalus Root Powd (Tragacanth) .... 500 mg daily by mouth 18)  Fluticasone Propionate 50 Mcg/act Susp (Fluticasone propionate) .... 2 sprays in each nostril two times a day 19)  Levoxyl 75 Mcg Tabs (Levothyroxine sodium) .... Take 1 by mouth once daily 20)  Bentyl 10 Mg Caps (Dicyclomine hcl) .... Take 1 tab 3-4 times daily for bloating, cramping, spasms 21)  Calcium-vitamin D 250-125 Mg-unit Tabs (Calcium carbonate-vitamin d) .... Take 1 by mouth once daily 22)  Sulfamethoxazole-tmp Ds 800-160 Mg Tabs (Sulfamethoxazole-trimethoprim) .Marland Kitchen.. 1 tab by mouth two times a day for bronchitis  Patient Instructions: 1)  Please schedule a follow-up appointment in 3-4  months .  Prescriptions: SULFAMETHOXAZOLE-TMP DS 800-160 MG TABS (SULFAMETHOXAZOLE-TRIMETHOPRIM) 1 tab by mouth two times a day for bronchitis  #20 x 0   Entered and Authorized by:   Cindee Salt MD   Signed by:   Cindee Salt MD on 05/07/2009   Method used:   Electronically to        [facility]. [address] [phone]* (retail)       [address] [place], [place]  [id number]       Ph: [phone]       Fax: [phone]   RxID:    [id number]   Current Allergies (reviewed today): ! * METRONIDAZOLE ACEON (PERINDOPRIL ERBUMINE) CODEINE PHOSPHATE (CODEINE PHOSPHATE) * PREDNISONE AMOXICILLIN (AMOXICILLIN) TRAMADOL HCL (TRAMADOL HCL) TRILEPTAL (OXCARBAZEPINE)

## 2010-05-07 NOTE — Progress Notes (Signed)
Summary: Headache  Phone Note Call from Patient   Caller: Patient Details for Reason: Experiencing headache Summary of Call: Headache that feels like a "swelling" and hurts the last 3 days.  Pt is sweating at night which is atypical and head hurts through the night.  Not a throbbing pain and is not severe but is constant.  She is taking Tylenol with some relief.  She is scheduled for tomorrow.  Please call patient at [phone] if she needs to be seen sooner.   Initial call taken by: Clarisa Schools,  September 19, 2009 8:48 AM  Follow-up for Phone Call        have her come in at 12:30PM today Follow-up by: Cindee Salt MD,  September 19, 2009 10:01 AM  Additional Follow-up for Phone Call Additional follow up Details #1::        APPT MADE Additional Follow-up by: Mervin Hack CMA Duncan Dull),  September 19, 2009 10:07 AM

## 2010-05-07 NOTE — Letter (Signed)
[facility]   Imported By: Lanelle Bal 09/10/2009 08:16:36  _____________________________________________________________________  External Attachment:    Type:   Image     Comment:   External Document  Appended Document: [facility] cholestyramine helping fecal incontinence trying to titrate dose on zantac for acid symptoms

## 2010-05-07 NOTE — Assessment & Plan Note (Signed)
Summary: HEADACHE/DS   Vital Signs:  Patient profile:   76 year old female Weight:      144 pounds Temp:     97.6 degrees F oral Pulse rate:   64 / minute Pulse rhythm:   regular BP sitting:   130 / 70  (left arm) Cuff size:   regular  Vitals Entered By: Mervin Hack CMA Duncan Dull) (September 19, 2009 12:49 PM) CC: headache   History of Present Illness: Started with feet problems at end of MAy spent extra time walking and neuropathy flared up had to take extra neurontin (900 two times a day)  Then fell and twisted right ankle 6/4 relates to dizziness from extra neurontin still some bruising  Since then, her head hurts no pounding no sinus pressure or tenderness Feels like her head is swelling no localized tenderness constant x 2 days no obvious exacerbating symptoms better after tylenol and rest yesterday has noticed sweats at night  heartburn has been worse on zantac 150 two times a day  Bad burning then cough after eating  Back to 600 two times a day of gabapentin  does note mild balance problems   Allergies: 1)  ! * Metronidazole 2)  Aceon 3)  Codeine Phosphate (Codeine Phosphate) 4)  * Prednisone 5)  Amoxicillin (Amoxicillin) 6)  Tramadol Hcl (Tramadol Hcl) 7)  Trileptal (Oxcarbazepine)  Past History:  Past medical, surgical, family and social histories (including risk factors) reviewed for relevance to current acute and chronic problems.  Past Medical History: Reviewed history from 09/27/2008 and no changes required. Allergic rhinitis Anxiety Hypertension Osteopenia Irritable bowel syndrome Hypothyroidism Gastritis GERD  Past Surgical History: Reviewed history from 03/05/2009 and no changes required. T&A:(1943) APPENDECTOMY:(1948) CYSTS ON OVARIES:(1963) HYSTERECTOMY / LEFT OVARY REMAINS /ENDOMETRIOSIS:(1968) HEART CATH (SUBCRITICAL CAD):(09/26/1998) BX. DYSPLASIA /CERVICAL CUFF/ 11/09/1998 DYSPLASIA LASER SURGERY:12/03/1998 DEXA :  OSTEOPENIA:(01/2000) ECHO; TRACE MR:( 12/2000) EGD:(06/2002) LUMBAR SYMPATHETIC BLOCK RECTUM HERNIA REPAIR (03/2005) CYSTOCELE/RECTOCELE REPAIR:(06/2004) ABDOMINAL WALL VENTRAL HERNIA REPAIR AND LYSIS OF ADHESIONS 5/10  Family History: Reviewed history from 01/21/2008 and no changes required. Dad :Suicide Mom died @94 :COPD Brother died of 2 strokes, MI No HTN, DM No breast or colon cancer No FH of Colon Cancer:  Social History: Reviewed history from 08/16/2007 and no changes required. Widowed 1997 1 daughter, 4 sons Former Smoker Alcohol use-occ Daily Caffeine Use 6 cups qd  Review of Systems       No cough except with heartburn No SOB some blurry vision but no diplopia or amaurosis  Physical Exam  General:  alert.  NAD Eyes:  pupils equal, pupils round, pupils reactive to light, and no optic disk abnormalities.   ?slight horizontal nystagmus on right lateral gaze Mouth:  no erythema and no exudates.   Neck:  supple, full ROM, and no masses.   Neurologic:  alert & oriented X3, cranial nerves II-XII intact, strength normal in all extremities, gait normal, and Romberg negative.   Did note abnormal head sensation with EOM testing Psych:  normally interactive, good eye contact, and slightly anxious.     Impression & Recommendations:  Problem # 1:  HEADACHE (ICD-784.0) Assessment New history not typical of any type of headache has some balance issues --symptoms partially recreated with EOM testing ?vestibular etiology  P: continue tylenol    try meclizine for now  Her updated medication list for this problem includes:    Tylenol 325 Mg Tabs (Acetaminophen) .Marland Kitchen... As needed  Problem # 2:  GERD (ICD-530.81) Assessment: Deteriorated ?? affected  if vestibular system invovled in headache has seen multiple specialists for this so no changes  Her updated medication list for this problem includes:    Pantoprazole Sodium 40 Mg Tbec (Pantoprazole sodium) .Marland Kitchen... 1 two  times a day    Bentyl 10 Mg Caps (Dicyclomine hcl) .Marland Kitchen... Take 1 tab 3-4 times daily for bloating, cramping, spasms    Ranitidine Hcl 150 Mg Caps (Ranitidine hcl) .Marland Kitchen... Take 1 by mouth two times a day  Problem # 3:  NEUROPATHY (ICD-355.9) Assessment: Deteriorated may need increased gabapentin or change to another agent  Complete Medication List: 1)  Buspar 15 Mg Tabs (Buspirone hcl) .... Take 1 tablet by mouth two times a day 2)  Gabapentin 300 Mg Caps (Gabapentin) .... Take 2 tablet in am and pm, 1 tab in midday 3)  Pantoprazole Sodium 40 Mg Tbec (Pantoprazole sodium) .Marland Kitchen.. 1 two times a day 4)  Lomotil 2.5-0.025 Mg Tabs (Diphenoxylate-atropine) .... Take 1 tablet before meals as needed 5)  Estrace 0.5 Mg Tabs (Estradiol) .Marland Kitchen.. 1 tablet by mouth once daily 6)  Amlodipine Besylate 5 Mg Tabs (Amlodipine besylate) .... Take one tablet by mouth daily 7)  Fexofenadine Hcl 180 Mg Tabs (Fexofenadine hcl) .... Take 1 tablet by mouth once daily 8)  Levoxyl 75 Mcg Tabs (Levothyroxine sodium) .... Take 1 by mouth once daily 9)  Bentyl 10 Mg Caps (Dicyclomine hcl) .... Take 1 tab 3-4 times daily for bloating, cramping, spasms 10)  Triamterene-hctz 75-50 Mg Tabs (Triamterene-hctz) .... Take 1/2 by mouth once daily 11)  Centrum Silver Tabs (Multiple vitamins-minerals) .... Take 1 tablet by mouth once a day 12)  Calcium-vitamin D 250-125 Mg-unit Tabs (Calcium carbonate-vitamin d) .... Take 1 by mouth once daily 13)  Gi Cocktail  .Marland Kitchen.. 10 cc by mouth every 4 hours as needed for pain/esophageal burning 14)  Ranitidine Hcl 150 Mg Caps (Ranitidine hcl) .... Take 1 by mouth two times a day 15)  Align Caps (Misc intestinal flora regulat) .Marland Kitchen.. 1 by mouth once daily 16)  Cholestyramine 4 Gm Pack (Cholestyramine) .... Take 1 small scoop in the a.m. 17)  Vitamin B-12 1000 Mcg Tabs (Cyanocobalamin) .... Take 1 tablet once a day by mouth 18)  Imodium Advanced 2-125 Mg Tabs (Loperamide-simethicone) .... As needed 19)   Tylenol 325 Mg Tabs (Acetaminophen) .... As needed 20)  Astragalus Root Powd (Tragacanth) .... 500 mg daily by mouth 21)  Meclizine Hcl 25 Mg Tabs (Meclizine hcl) .Marland Kitchen.. 1 tab by mouth three times a day for headache and balance problems  Patient Instructions: 1)  Please try the meclizine for the headaches and then slowly wean off after things settle down 2)  Please keep October appt. If neuropathy or other symptoms aren't better, call for earlier appt Prescriptions: MECLIZINE HCL 25 MG TABS (MECLIZINE HCL) 1 tab by mouth three times a day for headache and balance problems  #90 x 1   Entered and Authorized by:   Cindee Salt MD   Signed by:   Cindee Salt MD on 09/19/2009   Method used:   Electronically to        [facility]. [address] [phone]* (retail)       [address] [place], [place]  [address]       Ph: [phone]       Fax: [phone]   RxID:   [id number]   Current Allergies (reviewed today): ! * METRONIDAZOLE ACEON CODEINE PHOSPHATE (CODEINE  PHOSPHATE) * PREDNISONE AMOXICILLIN (AMOXICILLIN) TRAMADOL HCL (TRAMADOL HCL) TRILEPTAL (OXCARBAZEPINE)

## 2010-05-07 NOTE — Assessment & Plan Note (Signed)
Summary: 6 M F/U DLO R/S FROM 08/09/09   Vital Signs:  Patient profile:   76 year old female Weight:      145 pounds Temp:     97.8 degrees F oral Pulse rate:   76 / minute Pulse rhythm:   regular BP sitting:   118 / 78  (left arm) Cuff size:   regular  Vitals Entered By: Mervin Hack CMA Duncan Dull) (Aug 16, 2009 3:39 PM) CC: 4 month follow-up   History of Present Illness: Is finally feeling better Had another colonoscopy from [facility] diarrhea better on ranitidine and cholestyramine (though she finds this hard to take) Colonoscopy was benign also on probioitc--align   Still with some heartburn--esp at night on protonix still also  BP has been fine No chest pain No SOB Has had headache on left side for 2 days--but this is not typical for her  still needs the gabapentin for her neuropathy still on estrace--hasn't tried to wean  anxiety is better now with diarrhea better still notes mood swings  Allergies: 1)  ! * Metronidazole 2)  Aceon (Perindopril Erbumine) 3)  Codeine Phosphate (Codeine Phosphate) 4)  * Prednisone 5)  Amoxicillin (Amoxicillin) 6)  Tramadol Hcl (Tramadol Hcl) 7)  Trileptal (Oxcarbazepine)  Past History:  Past medical, surgical, family and social histories (including risk factors) reviewed for relevance to current acute and chronic problems.  Past Medical History: Reviewed history from 09/27/2008 and no changes required. Allergic rhinitis Anxiety Hypertension Osteopenia Irritable bowel syndrome Hypothyroidism Gastritis GERD  Past Surgical History: Reviewed history from 03/05/2009 and no changes required. T&A:(1943) APPENDECTOMY:(1948) CYSTS ON OVARIES:(1963) HYSTERECTOMY / LEFT OVARY REMAINS /ENDOMETRIOSIS:(1968) HEART CATH (SUBCRITICAL CAD):(09/26/1998) BX. DYSPLASIA /CERVICAL CUFF/ 11/09/1998 DYSPLASIA LASER SURGERY:12/03/1998 DEXA : OSTEOPENIA:(01/2000) ECHO; TRACE MR:( 12/2000) EGD:(06/2002) LUMBAR SYMPATHETIC BLOCK RECTUM  HERNIA REPAIR (03/2005) CYSTOCELE/RECTOCELE REPAIR:(06/2004) ABDOMINAL WALL VENTRAL HERNIA REPAIR AND LYSIS OF ADHESIONS 5/10  Family History: Reviewed history from 01/21/2008 and no changes required. Dad :Suicide Mom died @94 :COPD Brother died of 2 strokes, MI No HTN, DM No breast or colon cancer No FH of Colon Cancer:  Social History: Reviewed history from 08/16/2007 and no changes required. Widowed 1997 1 daughter, 4 sons Former Smoker Alcohol use-occ Daily Caffeine Use 6 cups qd  Review of Systems       weight is stable sleeps okay appetite is fine  Physical Exam  General:  alert and normal appearance.   Neck:  supple, no masses, no thyromegaly, no carotid bruits, and no cervical lymphadenopathy.   Lungs:  normal respiratory effort and normal breath sounds.   Heart:  normal rate, regular rhythm, no murmur, and no gallop.   Abdomen:  soft and no masses.  Mild left sided tenderness Pulses:  1+ in feet Extremities:  no edema Psych:  normally interactive, good eye contact, not anxious appearing, and not depressed appearing.     Impression & Recommendations:  Problem # 1:  HYPERTENSION (ICD-401.9) Assessment Improved  doing well will have her cut amlodipine in half and stop next time if still doing well  Her updated medication list for this problem includes:    Triamterene-hctz 37.5-25 Mg Tabs (Triamterene-hctz) .Marland Kitchen... Take 1 tablet by mouth once a day    Amlodipine Besylate 10 Mg Tabs (Amlodipine besylate) .Marland Kitchen... 1/2  tablet by mouth once daily  BP today: 118/78 Prior BP: 100/70 (06/07/2009)  Labs Reviewed: K+: 3.6 (03/05/2009) Creat: : 0.7 (03/05/2009)     Orders: TLB-Renal Function Panel (80069-RENAL) TLB-CBC Platelet - w/Differential (85025-CBCD)  TLB-Hepatic/Liver Function Pnl (80076-HEPATIC)  Problem # 2:  ANXIETY (ICD-300.00) Assessment: Improved nerves better with diarrhea controlled  Her updated medication list for this problem includes:     Buspar 15 Mg Tabs (Buspirone hcl) .Marland Kitchen... Take 1 tablet by mouth two times a day  Problem # 3:  NEUROPATHY (ICD-355.9) Assessment: Unchanged okay with the gabapentin  no changes  Problem # 4:  GERD (ICD-530.81) Assessment: Unchanged ongoing symptoms despite multiple therapies has GI follow up  Her updated medication list for this problem includes:    Pantoprazole Sodium 40 Mg Tbec (Pantoprazole sodium) .Marland Kitchen... 1 two times a day    Bentyl 10 Mg Caps (Dicyclomine hcl) .Marland Kitchen... Take 1 tab 3-4 times daily for bloating, cramping, spasms    Ranitidine Hcl 150 Mg Caps (Ranitidine hcl) .Marland Kitchen... Take 1 by mouth two times a day  Problem # 5:  HYPOTHYROIDISM (ICD-244.9) Assessment: Comment Only  due for labs  Her updated medication list for this problem includes:    Levoxyl 75 Mcg Tabs (Levothyroxine sodium) .Marland Kitchen... Take 1 by mouth once daily  Labs Reviewed: TSH: 1.16 (12/18/2008)     Orders: TLB-TSH (Thyroid Stimulating Hormone) (84443-TSH) Venipuncture (66440) TLB-T4 (Thyrox), Free [phone])  Complete Medication List: 1)  Buspar 15 Mg Tabs (Buspirone hcl) .... Take 1 tablet by mouth two times a day 2)  Gabapentin 300 Mg Caps (Gabapentin) .... Take 2 tablet two times a day by mouth 3)  Pantoprazole Sodium 40 Mg Tbec (Pantoprazole sodium) .Marland Kitchen.. 1 two times a day 4)  Triamterene-hctz 37.5-25 Mg Tabs (Triamterene-hctz) .... Take 1 tablet by mouth once a day 5)  Centrum Silver Tabs (Multiple vitamins-minerals) .... Take 1 tablet by mouth once a day 6)  Lomotil 2.5-0.025 Mg Tabs (Diphenoxylate-atropine) .... Take 1 tablet before meals as needed 7)  Estrace 0.5 Mg Tabs (Estradiol) .Marland Kitchen.. 1 tablet by mouth once daily 8)  Amlodipine Besylate 10 Mg Tabs (Amlodipine besylate) .... 1/2  tablet by mouth once daily 9)  Fexofenadine Hcl 180 Mg Tabs (Fexofenadine hcl) .... Take 1 tablet by mouth once daily 10)  Levoxyl 75 Mcg Tabs (Levothyroxine sodium) .... Take 1 by mouth once daily 11)  Bentyl 10 Mg Caps  (Dicyclomine hcl) .... Take 1 tab 3-4 times daily for bloating, cramping, spasms 12)  Calcium-vitamin D 250-125 Mg-unit Tabs (Calcium carbonate-vitamin d) .... Take 1 by mouth once daily 13)  Gi Cocktail  .Marland Kitchen.. 10 cc by mouth every 4 hours as needed for pain/esophageal burning 14)  Ranitidine Hcl 150 Mg Caps (Ranitidine hcl) .... Take 1 by mouth two times a day 15)  Align Caps (Misc intestinal flora regulat) .Marland Kitchen.. 1 by mouth once daily 16)  Cholestyramine 4 Gm Pack (Cholestyramine) .... Take 1 small scoop in the a.m. 17)  Vitamin B-12 1000 Mcg Tabs (Cyanocobalamin) .... Take 1 tablet once a day by mouth 18)  Imodium Advanced 2-125 Mg Tabs (Loperamide-simethicone) .... As needed 19)  Tylenol 325 Mg Tabs (Acetaminophen) .... As needed 20)  Astragalus Root Powd (Tragacanth) .... 500 mg daily by mouth  Patient Instructions: 1)  Please cut the amlodipine in half and only take 5mg  daily 2)  Please take the estradiol (estrace) only 6 days per week. In one month, if no problems, go down to 5 days per week. Keep reducing by 1 tab a week every month 3)  Please schedule a follow-up appointment in 6 months .  Prescriptions: GABAPENTIN 300 MG CAPS (GABAPENTIN) take 2 tablet two times a day by mouth  #  120 x 12   Entered and Authorized by:   Cindee Salt MD   Signed by:   Cindee Salt MD on 08/16/2009   Method used:   Electronically to        [facility]. [address] [phone]* (retail)       [address] [place], [place]  [address]       Ph: [phone]       Fax: [phone]   RxID:   [id number] ESTRACE 0.5 MG TABS (ESTRADIOL) 1 tablet by mouth once daily  #30 x 12   Entered and Authorized by:   Cindee Salt MD   Signed by:   Cindee Salt MD on 08/16/2009   Method used:   Electronically to        [facility]. [address] [phone]* (retail)       [address] [place], [place]  [address]       Ph: [phone]       Fax: [phone]   RxID:    [id number] AMLODIPINE BESYLATE 10 MG TABS (AMLODIPINE BESYLATE) 1/2  tablet by mouth once daily  #30 x 12   Entered and Authorized by:   Cindee Salt MD   Signed by:   Cindee Salt MD on 08/16/2009   Method used:   Electronically to        [facility]. [address] [phone]* (retail)       [address] [place], [place]  [id number]       Ph: [phone]       Fax: [phone]   RxID:   [id number]   Current Allergies (reviewed today): ! * METRONIDAZOLE ACEON (PERINDOPRIL ERBUMINE) CODEINE PHOSPHATE (CODEINE PHOSPHATE) * PREDNISONE AMOXICILLIN (AMOXICILLIN) TRAMADOL HCL (TRAMADOL HCL) TRILEPTAL (OXCARBAZEPINE)

## 2010-05-07 NOTE — Assessment & Plan Note (Signed)
Summary: 2:15 ?UTI/CLE   Vital Signs:  Patient profile:   76 year old female Weight:      145 pounds Temp:     97.9 degrees F oral BP sitting:   100 / 70  (left arm) Cuff size:   regular  Vitals Entered By: Mervin Hack CMA Duncan Dull) (June 07, 2009 2:21 PM) CC: UTI   History of Present Illness: Concerns about her bladder hurting some dysuria--bad burning now IBS acting up  so much has been happening all at the same time  Did try the 2 septra DS---no help  concerned about possible yeast infection as well no discharge no real vaginal symptoms though---just has pain when urine passes  slight fever--only knows due to slight chill  Allergies: 1)  ! * Metronidazole 2)  Aceon (Perindopril Erbumine) 3)  Codeine Phosphate (Codeine Phosphate) 4)  * Prednisone 5)  Amoxicillin (Amoxicillin) 6)  Tramadol Hcl (Tramadol Hcl) 7)  Trileptal (Oxcarbazepine)  Past History:  Past medical, surgical, family and social histories (including risk factors) reviewed for relevance to current acute and chronic problems.  Past Medical History: Reviewed history from 09/27/2008 and no changes required. Allergic rhinitis Anxiety Hypertension Osteopenia Irritable bowel syndrome Hypothyroidism Gastritis GERD  Past Surgical History: Reviewed history from 03/05/2009 and no changes required. T&A:(1943) APPENDECTOMY:(1948) CYSTS ON OVARIES:(1963) HYSTERECTOMY / LEFT OVARY REMAINS /ENDOMETRIOSIS:(1968) HEART CATH (SUBCRITICAL CAD):(09/26/1998) BX. DYSPLASIA /CERVICAL CUFF/ 11/09/1998 DYSPLASIA LASER SURGERY:12/03/1998 DEXA : OSTEOPENIA:(01/2000) ECHO; TRACE MR:( 12/2000) EGD:(06/2002) LUMBAR SYMPATHETIC BLOCK RECTUM HERNIA REPAIR (03/2005) CYSTOCELE/RECTOCELE REPAIR:(06/2004) ABDOMINAL WALL VENTRAL HERNIA REPAIR AND LYSIS OF ADHESIONS 5/10  Family History: Reviewed history from 01/21/2008 and no changes required. Dad :Suicide Mom died @94 :COPD Brother died of 2 strokes, MI No  HTN, DM No breast or colon cancer No FH of Colon Cancer:  Social History: Reviewed history from 08/16/2007 and no changes required. Widowed 1997 1 daughter, 4 sons Former Smoker Alcohol use-occ Daily Caffeine Use 6 cups qd  Review of Systems       no vomiting some loose stools ---has been taking lots of immodium appetite is okay  Physical Exam  General:  alert and normal appearance.   Abdomen:  soft.  Moderate suprapubic tenderness   Impression & Recommendations:  Problem # 1:  OTHER CHRONIC CYSTITIS (ICD-595.2) Assessment New  ongoing bladder symptoms but no evidence of infection may have intersitiial cystitis not excited about other evaluation  will try to increase fluids urology eval if not improving (Cope/Humphries)  Orders: UA Dipstick w/o Micro (manual) (81191)  Complete Medication List: 1)  Buspar 15 Mg Tabs (Buspirone hcl) .... Take 1 tablet by mouth two times a day 2)  Gabapentin 300 Mg Caps (Gabapentin) .... Take 2 tablet two times a day by mouth 3)  Vitamin B-12 1000 Mcg Tabs (Cyanocobalamin) .... Take 1 tablet once a day by mouth 4)  Pantoprazole Sodium 40 Mg Tbec (Pantoprazole sodium) .Marland Kitchen.. 1 two times a day 5)  Sulfamethoxazole-tmp Ds 800-160 Mg Tabs (Sulfamethoxazole-trimethoprim) .... 2 tabs at onset of bladder symptoms 6)  Triamterene-hctz 37.5-25 Mg Tabs (Triamterene-hctz) .... Take 1 tablet by mouth once a day 7)  Centrum Silver Tabs (Multiple vitamins-minerals) .... Take 1 tablet by mouth once a day 8)  Lomotil 2.5-0.025 Mg Tabs (Diphenoxylate-atropine) .... Take 1 tablet before meals as needed 9)  Imodium Advanced 2-125 Mg Tabs (Loperamide-simethicone) .... As needed 10)  Tylenol 325 Mg Tabs (Acetaminophen) .... As needed 11)  Estrace 0.5 Mg Tabs (Estradiol) .Marland Kitchen.. 1 tablet by mouth  once daily 12)  Benadryl 25 Mg Caps (Diphenhydramine hcl) .Marland Kitchen.. 1 capsule by mouth at bedtime 13)  Amlodipine Besylate 10 Mg Tabs (Amlodipine besylate) .Marland Kitchen.. 1 tablet by  mouth once daily 14)  Align Caps (Misc intestinal flora regulat) .Marland Kitchen.. 1 by mouth once daily 15)  Fexofenadine Hcl 180 Mg Tabs (Fexofenadine hcl) .... Take 1 tablet by mouth once daily 16)  Astragalus Root Powd (Tragacanth) .... 500 mg daily by mouth 17)  Fluticasone Propionate 50 Mcg/act Susp (Fluticasone propionate) .... 2 sprays in each nostril two times a day 18)  Levoxyl 75 Mcg Tabs (Levothyroxine sodium) .... Take 1 by mouth once daily 19)  Bentyl 10 Mg Caps (Dicyclomine hcl) .... Take 1 tab 3-4 times daily for bloating, cramping, spasms 20)  Calcium-vitamin D 250-125 Mg-unit Tabs (Calcium carbonate-vitamin d) .... Take 1 by mouth once daily  Patient Instructions: 1)  Keep May appt 2)  Call in the next few weeks if bladder is not better and we can refer to a urologist  Laboratory Results   Urine Tests  Date/Time Received: June 07, 2009 2:22 PM Date/Time Reported: June 07, 2009 2:22 PM  Routine Urinalysis   Color: yellow Appearance: Clear Glucose: negative   (Normal Range: Negative) Bilirubin: negative   (Normal Range: Negative) Ketone: negative   (Normal Range: Negative) Spec. Gravity: <1.005   (Normal Range: 1.003-1.035) Blood: negative   (Normal Range: Negative) pH: 6.5   (Normal Range: 5.0-8.0) Protein: negative   (Normal Range: Negative) Urobilinogen: 0.2   (Normal Range: 0-1) Nitrite: negative   (Normal Range: Negative) Leukocyte Esterace: negative   (Normal Range: Negative)        Current Allergies (reviewed today): ! * METRONIDAZOLE ACEON (PERINDOPRIL ERBUMINE) CODEINE PHOSPHATE (CODEINE PHOSPHATE) * PREDNISONE AMOXICILLIN (AMOXICILLIN) TRAMADOL HCL (TRAMADOL HCL) TRILEPTAL (OXCARBAZEPINE)

## 2010-05-07 NOTE — Progress Notes (Signed)
Summary: neuropathy is worse  Phone Note Call from Patient Call back at Home Phone [phone]   Caller: Patient Summary of Call: Pt has neuropathy in her feet that is getting worse.  She has been doing increased amounts of walking and has had to take extra gabapentin and tylenol.  She is up to 900 mg's of neurontin two times a day and 2 ES Tylenol two times a day.  She knows Dr. Alphonsus Sias is out but she is asking if there is anything else that she can do over the week end, until Dr. Alphonsus Sias returns on monday.   States her feet burn and tingle and she has difficulty walking.  Uses [facility] [address] Initial call taken by: Lowella Petties CMA,  September 07, 2009 12:10 PM  Follow-up for Phone Call        Can take neuronitn three times a day...try 300 mg at midday. Follow up with [facility]. Refill neurontin as needed.  Follow-up by: Kerby Nora MD,  September 07, 2009 12:29 PM  Additional Follow-up for Phone Call Additional follow up Details #1::        Patient advised and will call for appt with letvak on monday Additional Follow-up by: Benny Lennert CMA Duncan Dull),  September 07, 2009 12:38 PM    New/Updated Medications: GABAPENTIN 300 MG CAPS (GABAPENTIN) take 2 tablet in AM and PM, 1 tab in midday

## 2010-05-07 NOTE — Letter (Signed)
Summary: [facility]  [facility]   Imported By: Lanelle Bal 12/12/2009 12:15:54  _____________________________________________________________________  External Attachment:    Type:   Image     Comment:   External Document  Appended Document: [facility] doing biopsy looking for small fiber neuropathy having her increase her gabapentin still getting pain shots

## 2010-05-07 NOTE — Letter (Signed)
Summary: [facility]   Imported By: Lester [place] 10/04/2009 11:54:42  _____________________________________________________________________  External Attachment:    Type:   Image     Comment:   External Document  Appended Document: [facility] changing to nexium for worsening reflux symptoms plans to review past EGDs

## 2010-05-07 NOTE — Assessment & Plan Note (Signed)
Summary: 6 M F/U DLO   Vital Signs:  Patient profile:   76 year old female Weight:      143 pounds BMI:     26.90 Temp:     98.1 degrees F oral BP sitting:   120 / 80  (left arm) Cuff size:   regular  Vitals Entered By: Mervin Hack CMA Duncan Dull) (January 22, 2010 12:27 PM) CC: 6 month follow-up   History of Present Illness: Still with ongoing problems  Diarrhea is better with the cholestyramine Reflux is worse now Did have repeat EGD---no obvious findings to explain problems on lots of zantac in addition to the protonix  Still with neuropathy problems seeing Dr Laban Emperor Had MRI done---reviewed today Did have some subsequent nerve blocks done  Angina attack on 9/24 Bad chest pain BP 173/100 took ASA BP then settled down-- finally 134/75 Never got evaluation No dyspnea, diaphoresis or nausea no recurrence since  Tried to wean off estrogen Not able to due to sevre night sweats and mood issues--constant crying  Allergies: 1)  ! * Metronidazole 2)  Aceon (Perindopril Erbumine) 3)  Codeine Phosphate (Codeine Phosphate) 4)  * Prednisone 5)  Amoxicillin (Amoxicillin) 6)  Tramadol Hcl (Tramadol Hcl) 7)  Trileptal (Oxcarbazepine)  Past History:  Past medical, surgical, family and social histories (including risk factors) reviewed for relevance to current acute and chronic problems.  Past Medical History: Reviewed history from 09/27/2008 and no changes required. Allergic rhinitis Anxiety Hypertension Osteopenia Irritable bowel syndrome Hypothyroidism Gastritis GERD  Past Surgical History: Reviewed history from 03/05/2009 and no changes required. T&A:(1943) APPENDECTOMY:(1948) CYSTS ON OVARIES:(1963) HYSTERECTOMY / LEFT OVARY REMAINS /ENDOMETRIOSIS:(1968) HEART CATH (SUBCRITICAL CAD):(09/26/1998) BX. DYSPLASIA /CERVICAL CUFF/ 11/09/1998 DYSPLASIA LASER SURGERY:12/03/1998 DEXA : OSTEOPENIA:(01/2000) ECHO; TRACE MR:( 12/2000) EGD:(06/2002) LUMBAR  SYMPATHETIC BLOCK RECTUM HERNIA REPAIR (03/2005) CYSTOCELE/RECTOCELE REPAIR:(06/2004) ABDOMINAL WALL VENTRAL HERNIA REPAIR AND LYSIS OF ADHESIONS 5/10  Family History: Reviewed history from 01/21/2008 and no changes required. Dad :Suicide Mom died @94 :COPD Brother died of 2 strokes, MI No HTN, DM No breast or colon cancer No FH of Colon Cancer:  Social History: Reviewed history from 08/16/2007 and no changes required. Widowed 1997 1 daughter, 4 sons Former Smoker Alcohol use-occ Daily Caffeine Use 6 cups qd  Review of Systems       appetite okay sleeps well weight stable has noted some hair loss--generallized  Physical Exam  General:  alert and normal appearance.   Neck:  supple, no masses, no thyromegaly, no carotid bruits, and no cervical lymphadenopathy.   Lungs:  normal respiratory effort, no intercostal retractions, no accessory muscle use, and normal breath sounds.   Heart:  normal rate, regular rhythm, and no gallop.   Soft aortic systolic murmur Abdomen:  soft and non-tender.   Pulses:  faint in both feet Extremities:  no edema Psych:  normally interactive, good eye contact, not depressed appearing, and slightly anxious.     Impression & Recommendations:  Problem # 1:  CHEST PAIN (ICD-786.50) Assessment New  EKG benign discussed that she should call 911 for any persistent CP likely GI  Orders: EKG w/ Interpretation (93000)  Problem # 2:  HYPERTENSION (ICD-401.9) Assessment: Unchanged  good control no changes needed  Her updated medication list for this problem includes:    Amlodipine Besylate 5 Mg Tabs (Amlodipine besylate) .Marland Kitchen... Take one tablet by mouth daily    Triamterene-hctz 75-50 Mg Tabs (Triamterene-hctz) .Marland Kitchen... Take 1/2 by mouth once daily  BP today: 120/80 Prior BP: 130/70 (09/19/2009)  Labs Reviewed: K+: 3.9 (08/16/2009) Creat: : 0.6 (08/16/2009)     Orders: Venipuncture (78295) TLB-Renal Function Panel (80069-RENAL) TLB-CBC  Platelet - w/Differential (85025-CBCD)  Problem # 3:  DIARRHEA (ICD-787.91) Assessment: Improved better   Her updated medication list for this problem includes:    Lomotil 2.5-0.025 Mg Tabs (Diphenoxylate-atropine) .Marland Kitchen... Take 1 tablet before meals as needed    Align Caps (Misc intestinal flora regulat) .Marland Kitchen... 1 by mouth once daily    Imodium Advanced 2-125 Mg Tabs (Loperamide-simethicone) .Marland Kitchen... As needed  Problem # 4:  NEUROPATHY (ICD-355.9) Assessment: Improved better after shots again  Problem # 5:  HYPOTHYROIDISM (ICD-244.9) Assessment: Unchanged fine on replacement  Her updated medication list for this problem includes:    Levoxyl 75 Mcg Tabs (Levothyroxine sodium) .Marland Kitchen... Take 1 by mouth once daily  Labs Reviewed: TSH: 2.47 (08/16/2009)     Complete Medication List: 1)  Flonase 50 Mcg/act Susp (Fluticasone propionate) .... 2 sprays in each nostril daily 2)  Meclizine Hcl 25 Mg Tabs (Meclizine hcl) .Marland Kitchen.. 1 tab by mouth three times a day for headache and balance problems 3)  Buspar 15 Mg Tabs (Buspirone hcl) .... Take 1 tablet by mouth two times a day 4)  Gabapentin 300 Mg Caps (Gabapentin) .... Take 2 tablet in am and pm, 1 tab in midday 5)  Pantoprazole Sodium 40 Mg Tbec (Pantoprazole sodium) .Marland Kitchen.. 1 two times a day 6)  Lomotil 2.5-0.025 Mg Tabs (Diphenoxylate-atropine) .... Take 1 tablet before meals as needed 7)  Estrace 0.5 Mg Tabs (Estradiol) .Marland Kitchen.. 1 tablet by mouth once daily 8)  Amlodipine Besylate 5 Mg Tabs (Amlodipine besylate) .... Take one tablet by mouth daily 9)  Levoxyl 75 Mcg Tabs (Levothyroxine sodium) .... Take 1 by mouth once daily 10)  Triamterene-hctz 75-50 Mg Tabs (Triamterene-hctz) .... Take 1/2 by mouth once daily 11)  Centrum Silver Tabs (Multiple vitamins-minerals) .... Take 1 tablet by mouth once a day 12)  Calcium-vitamin D 250-125 Mg-unit Tabs (Calcium carbonate-vitamin d) .... Take 1 by mouth once daily 13)  Ranitidine Hcl 150 Mg Caps (Ranitidine hcl)  .... Take 1 by mouth two times a day 14)  Align Caps (Misc intestinal flora regulat) .Marland Kitchen.. 1 by mouth once daily 15)  Cholestyramine 4 Gm Pack (Cholestyramine) .... Take 1 small scoop in the a.m. 16)  Vitamin B-12 1000 Mcg Tabs (Cyanocobalamin) .... Take 1 tablet once a day by mouth 17)  Imodium Advanced 2-125 Mg Tabs (Loperamide-simethicone) .... As needed 18)  Tylenol 325 Mg Tabs (Acetaminophen) .... As needed 19)  Astragalus Root Powd (Tragacanth) .... 500 mg daily by mouth 20)  Gi Cocktail  .Marland Kitchen.. 10 cc by mouth every 4 hours as needed for pain/esophageal burning  Patient Instructions: 1)  Please schedule a follow-up appointment in 6 months .    Orders Added: 1)  EKG w/ Interpretation [93000] 2)  Est. Patient Level IV [62130] 3)  Venipuncture [36415] 4)  TLB-Renal Function Panel [80069-RENAL] 5)  TLB-CBC Platelet - w/Differential [85025-CBCD]    Current Allergies (reviewed today): ! * METRONIDAZOLE ACEON (PERINDOPRIL ERBUMINE) CODEINE PHOSPHATE (CODEINE PHOSPHATE) * PREDNISONE AMOXICILLIN (AMOXICILLIN) TRAMADOL HCL (TRAMADOL HCL) TRILEPTAL (OXCARBAZEPINE)

## 2010-05-07 NOTE — Progress Notes (Signed)
Summary: wants referral to [facility]  Phone Note Call from Patient Call back at Home Phone [phone]   Caller: Patient Call For: Cindee Salt MD Summary of Call: Pt wants referral to [facility] for her GI problems, she wants to see Dr. Ray Church or Dr. Riccardo Dubin asap.  If she cant get immediate appt with either of these, please call her and let her know and she will get other names.  States this is urgent to her. Initial call taken by: Lowella Petties CMA,  July 11, 2009 2:26 PM  Follow-up for Phone Call        okay to make referral  Not sure how long it will take to get in Follow-up by: Cindee Salt MD,  July 11, 2009 8:38 PM  Additional Follow-up for Phone Call Additional follow up Details #1::        Appt made with Dr Fulton Reek on 07/18/2009 at 2:00pm. Additional Follow-up by: Carlton Adam,  July 13, 2009 4:14 PM

## 2010-05-07 NOTE — Assessment & Plan Note (Signed)
Summary: FLU SHOT/CLE   Nurse Visit   Allergies: 1)  ! * Metronidazole 2)  Aceon (Perindopril Erbumine) 3)  Codeine Phosphate (Codeine Phosphate) 4)  * Prednisone 5)  Amoxicillin (Amoxicillin) 6)  Tramadol Hcl (Tramadol Hcl) 7)  Trileptal (Oxcarbazepine)  Immunizations Administered:  Influenza Vaccine # 1:    Vaccine Type: Fluvax 3+    Site: left deltoid    Mfr: GlaxoSmithKline    Dose: 0.5 ml    Route: IM    Given by: Mervin Hack CMA (AAMA)    Exp. Date: 10/05/2010    Lot #: [id number]    VIS given: 10/30/09 version given December 25, 2009.  Flu Vaccine Consent Questions:    Do you have a history of severe allergic reactions to this vaccine? no    Any prior history of allergic reactions to egg and/or gelatin? no    Do you have a sensitivity to the preservative Thimersol? no    Do you have a past history of Guillan-Barre Syndrome? no    Do you currently have an acute febrile illness? no    Have you ever had a severe reaction to latex? no    Vaccine information given and explained to patient? yes    Are you currently pregnant? no  Orders Added: 1)  Flu Vaccine 39yrs + [90658] 2)  Admin 1st Vaccine [29562]

## 2010-05-07 NOTE — Letter (Signed)
[facility]   Imported By: Lanelle Bal 10/26/2009 12:19:01  _____________________________________________________________________  External Attachment:    Type:   Image     Comment:   External Document  Appended Document: [facility] called there still with reflux problems reiterated importance of taking pantoprazole on empty stomach---taking two times a day

## 2010-05-07 NOTE — Letter (Signed)
[facility]   Imported By: Lanelle Bal 10/29/2009 10:42:50  _____________________________________________________________________  External Attachment:    Type:   Image     Comment:   External Document  Appended Document: [facility] protonix two times a day and add ranitidine 300mg  at bedtime for night symptoms

## 2010-05-07 NOTE — Consult Note (Signed)
[facility]   Imported By: Lanelle Bal 07/31/2009 09:53:16  _____________________________________________________________________  External Attachment:    Type:   Image     Comment:   External Document  Appended Document: [facility] 3rd opinion Plans colon trying cholestyramine

## 2010-05-07 NOTE — Progress Notes (Signed)
Summary: feet are better  Phone Note Call from Patient Call back at Home Phone [phone]   Caller: Patient Call For: Cindee Salt MD Summary of Call: Pt states she has gone back to her original dose of gabapentin, 2 300 mg's in the morning and2 300 mg's at night.  Her feet are better but she fell saturday and injured her right leg.   She bruised the leg pretty bad and scraped her lower leg.  She is taking care of this  pretty well. Initial call taken by: Lowella Petties CMA,  September 10, 2009 2:47 PM  Follow-up for Phone Call        okay will continue the current meds since she is doing better Follow-up by: Cindee Salt MD,  September 10, 2009 7:59 PM

## 2010-05-07 NOTE — Progress Notes (Signed)
Summary: Refill on GI cocktail   Phone Note From Pharmacy   Caller: [facility] in [place] [phone] Call For: Dr Leone Payor  Reason for Call: Needs renewal Summary of Call: Needs GI coctail refill Initial call taken by: Leanor Kail [facility],  July 19, 2009 1:21 PM  Follow-up for Phone Call        Dr. Leone Payor GI Cocktail is not on the med list looks like the last time it was given was 01/2009. I tried to call the patient but there was no answer. Do you want to fill the Med?  Follow-up by: Harlow Mares CMA Duncan Dull),  July 19, 2009 2:16 PM  Additional Follow-up for Phone Call Additional follow up Details #1::        she was on it when we last saw her 11/10. we should clarify with patient before we refill it - as long as not any new problems then we can refill Additional Follow-up by: Iva Boop MD, Clementeen Graham,  July 20, 2009 8:04 AM    Additional Follow-up for Phone Call Additional follow up Details #2::    called pt she states that she is having the same problem she has always had after she eats he gets a globus feeling ans uses her GI Cocktail as needed, she went to fill her last refill and it had ran out before she filled it so she needed it refilled. I advised her i would send it to her pharm.  Follow-up by: Harlow Mares CMA Duncan Dull),  July 20, 2009 8:41 AM  New/Updated Medications: * GI COCKTAIL 10 CC by mouth every 4 hours as needed for pain/esophageal burning Prescriptions: GI COCKTAIL 10 CC by mouth every 4 hours as needed for pain/esophageal burning  #350 x 5   Entered by:   Harlow Mares CMA (AAMA)   Authorized by:   Iva Boop MD, [facility]   Signed by:   Harlow Mares CMA (AAMA) on 07/20/2009   Method used:   Faxed to ...       [facility] [address] [phone]* (retail)       [address] [place], [place]  [id number]       Ph: [phone]       Fax: [phone]   RxID:   [id number]

## 2010-05-07 NOTE — Progress Notes (Signed)
Summary: Refill abx  Phone Note Outgoing Call   Call placed by: Mervin Hack CMA Duncan Dull),  May 31, 2009 2:20 PM Call placed to: Patient Summary of Call: calling pt to see why she wants a refill of SULFAMETHOXAZOLE-TMP DS 800-160 MG TABS (SULFAMETHOXAZOLE-TRIMETHOPRIM) 1 tab by mouth two times a day for bronchitis  #20 x 0, pt states she is having bladder problems and this medication is what she was on before. Per Dr. Alphonsus Sias, pt needs to be seen. I offered this afternoon and Friday, pt declined. Pt states she will see how she does thru the weekend and will call on Monday if needed. Initial call taken by: Mervin Hack CMA Duncan Dull),  May 31, 2009 2:23 PM  Follow-up for Phone Call        noted  Have her try some cranberry juice or cranberry tablets and lots of fluids. That will often flush out an early bladder infection Follow-up by: Cindee Salt MD,  May 31, 2009 3:04 PM  Additional Follow-up for Phone Call Additional follow up Details #1::        Spoke with patient and advised results.  Additional Follow-up by: Mervin Hack CMA Duncan Dull),  May 31, 2009 3:54 PM

## 2010-05-07 NOTE — Progress Notes (Signed)
  Phone Note Call from Patient   Caller: Patient Summary of Call: Patient called to request that you refer her to Dr Pernell Dupre [facility]. Please put new referral in for Pain Referral. Thanks, Shirlee Limerick  Initial call taken by: Carlton Adam,  September 21, 2009 10:24 AM  Follow-up for Phone Call        referral made Follow-up by: Cindee Salt MD,  September 21, 2009 10:59 AM

## 2010-05-07 NOTE — Letter (Signed)
Summary: Results Follow up Letter  [place] [facility]  [address] [place], [place] [address]   Phone: [phone]  Fax: [phone]    04/16/2009 MRN: [id number]  MOMOKA STRINGFIELD [address] [place], [place]  [address]  Dear Ms. Simmie Davies,  The following are the results of your recent test(s):  Test         Result    Pap Smear:        Normal _____  Not Normal _____ Comments: ______________________________________________________ Cholesterol: LDL(Bad cholesterol):         Your goal is less than:         HDL (Good cholesterol):       Your goal is more than: Comments:  ______________________________________________________ Mammogram:        Normal __X___  Not Normal _____ Comments: repeat recommended in 1 year  ___________________________________________________________________ Hemoccult:        Normal _____  Not normal _______ Comments:    _____________________________________________________________________ Other Tests:    We routinely do not discuss normal results over the telephone.  If you desire a copy of the results, or you have any questions about this information we can discuss them at your next office visit.   Sincerely,      Tillman Abide, MD

## 2010-05-28 ENCOUNTER — Ambulatory Visit: Payer: Self-pay | Admitting: Pain Medicine

## 2010-06-06 ENCOUNTER — Encounter: Payer: Self-pay | Admitting: Internal Medicine

## 2010-06-10 ENCOUNTER — Encounter: Payer: Self-pay | Admitting: Internal Medicine

## 2010-06-17 ENCOUNTER — Other Ambulatory Visit: Payer: Self-pay | Admitting: Internal Medicine

## 2010-06-17 DIAGNOSIS — Z1231 Encounter for screening mammogram for malignant neoplasm of breast: Secondary | ICD-10-CM

## 2010-06-24 ENCOUNTER — Ambulatory Visit: Payer: Self-pay | Admitting: Pain Medicine

## 2010-06-25 ENCOUNTER — Ambulatory Visit: Payer: Self-pay

## 2010-07-15 ENCOUNTER — Ambulatory Visit
Admission: RE | Admit: 2010-07-15 | Discharge: 2010-07-15 | Disposition: A | Discharge status: Home / Self Care | Payer: Medicare Other | Source: Ambulatory Visit | Attending: Internal Medicine | Admitting: Internal Medicine

## 2010-07-15 ENCOUNTER — Other Ambulatory Visit: Payer: Self-pay | Admitting: Internal Medicine

## 2010-07-15 DIAGNOSIS — Z1231 Encounter for screening mammogram for malignant neoplasm of breast: Secondary | ICD-10-CM

## 2010-07-16 ENCOUNTER — Encounter: Payer: Self-pay | Admitting: *Deleted

## 2010-07-16 LAB — COMPREHENSIVE METABOLIC PANEL
ALT: 17 U/L (ref 0–35)
AST: 24 U/L (ref 0–37)
Albumin: 4.2 g/dL (ref 3.5–5.2)
Alkaline Phosphatase: 39 U/L (ref 39–117)
Chloride: 102 mEq/L (ref 96–112)
Potassium: 3.9 mEq/L (ref 3.5–5.1)
Sodium: 141 mEq/L (ref 135–145)
Total Bilirubin: 0.4 mg/dL (ref 0.3–1.2)
Total Protein: 7.5 g/dL (ref 6.0–8.3)

## 2010-07-16 LAB — CBC
Platelets: 312 10*3/uL (ref 150–400)
RDW: 13.7 % (ref 11.5–15.5)
WBC: 4.6 10*3/uL (ref 4.0–10.5)

## 2010-07-16 LAB — DIFFERENTIAL
Basophils Absolute: 0 10*3/uL (ref 0.0–0.1)
Basophils Relative: 1 % (ref 0–1)
Eosinophils Absolute: 0.1 10*3/uL (ref 0.0–0.7)
Eosinophils Relative: 2 % (ref 0–5)
Monocytes Absolute: 0.4 10*3/uL (ref 0.1–1.0)
Monocytes Relative: 8 % (ref 3–12)

## 2010-07-16 NOTE — Progress Notes (Signed)
Letter sent to home address

## 2010-07-22 ENCOUNTER — Encounter: Payer: Self-pay | Admitting: Internal Medicine

## 2010-07-22 ENCOUNTER — Ambulatory Visit (INDEPENDENT_AMBULATORY_CARE_PROVIDER_SITE_OTHER): Payer: Medicare Other | Admitting: Internal Medicine

## 2010-07-22 VITALS — BP 130/70 | HR 74 | Temp 98.2°F | Ht 61.0 in | Wt 146.0 lb

## 2010-07-22 DIAGNOSIS — I10 Essential (primary) hypertension: Secondary | ICD-10-CM

## 2010-07-22 DIAGNOSIS — G589 Mononeuropathy, unspecified: Secondary | ICD-10-CM

## 2010-07-22 DIAGNOSIS — K589 Irritable bowel syndrome without diarrhea: Secondary | ICD-10-CM

## 2010-07-22 DIAGNOSIS — F411 Generalized anxiety disorder: Secondary | ICD-10-CM

## 2010-07-22 MED ORDER — GABAPENTIN 300 MG PO CAPS: 600.0000 mg | ORAL_CAPSULE | Freq: Two times a day (BID) | ORAL | Status: DC

## 2010-07-22 MED ORDER — BUSPIRONE HCL 15 MG PO TABS: 15.0000 mg | ORAL_TABLET | Freq: Three times a day (TID) | ORAL | Status: DC

## 2010-07-22 MED ORDER — DIPHENOXYLATE-ATROPINE 2.5-0.025 MG PO TABS: 1.0000 | ORAL_TABLET | Freq: Every day | ORAL | Status: DC

## 2010-07-22 MED ORDER — GABAPENTIN 300 MG PO CAPS: 300.0000 mg | ORAL_CAPSULE | Freq: Three times a day (TID) | ORAL | Status: DC

## 2010-07-22 MED ORDER — TRIAMTERENE-HCTZ 75-50 MG PO TABS: 1.0000 | ORAL_TABLET | Freq: Every day | ORAL | Status: DC

## 2010-07-22 NOTE — Progress Notes (Signed)
Subjective:    Patient ID: Cheyenne Pierce, female    DOB: Aug 13, 1934, 76 y.o.   MRN: [id number]  HPI Doing "pretty good" Did have recent visit with Dr Laban Emperor Nerves were so bad, he suggested trying kava Still taking the buspirone Stress with DIL's death from cancer---took extra buspirone and gabapentin.Feels that helped Has had some feelings of depression and helplessness "just not quite right"---- feels like (keeps her fists clenched like she is stressed)  Gabapentin 600 bid helps with neuropathic pain Still got injection in February from Dr Jess Barters have been better Uses lomotil only occ--gets from Dr Leone Payor  Has had some chest pain----is able to stop and it goes away with some deep breaths No SOB No edema  Some stomach problems Uses alka seltzer after eating Remains on the protonix  Has noticed some short term memory loss over the past 3-4 months Forgets to flush cammode at times or not sure if she has Forgets if grace stated before a meal  Current outpatient prescriptions:Alum & Mag Hydroxide-Simeth (GI COCKTAIL) SUSP, Take 30 mLs by mouth.  , Disp: , Rfl: ;  amLODipine (NORVASC) 5 MG tablet, take 1 tablet by mouth once daily, Disp: 30 tablet, Rfl: 4;  busPIRone (BUSPAR) 15 MG tablet, Take 15 mg by mouth 2 (two) times daily.  , Disp: , Rfl: ;  calcium-vitamin D (OSCAL) 250-125 MG-UNIT per tablet, Take 1 tablet by mouth daily.  , Disp: , Rfl:  cholestyramine (QUESTRAN) 4 G packet, Take 1 packet by mouth daily with breakfast.  , Disp: , Rfl: ;  diphenoxylate-atropine (LOMOTIL) 2.5-0.025 MG per tablet, Take 1 tablet by mouth daily before supper.  , Disp: , Rfl: ;  estradiol (ESTRACE) 0.5 MG tablet, Take 0.5 mg by mouth daily.  , Disp: , Rfl: ;  fluticasone (FLONASE) 50 MCG/ACT nasal spray, 2 sprays by Nasal route daily.  , Disp: , Rfl:  gabapentin (NEURONTIN) 300 MG capsule, Take 1 capsule (300 mg total) by mouth 3 (three) times daily., Disp: 270 capsule, Rfl: 3;   levothyroxine (SYNTHROID, LEVOTHROID) 75 MCG tablet, Take 75 mcg by mouth daily.  , Disp: , Rfl: ;  Loperamide-Simethicone (IMODIUM ADVANCED) 2-125 MG TABS, Take by mouth.  , Disp: , Rfl: ;  meclizine (ANTIVERT) 25 MG tablet, Take 25 mg by mouth 3 (three) times daily as needed.  , Disp: , Rfl:  Multiple Vitamins-Minerals (CENTRUM SILVER PO), Take by mouth.  , Disp: , Rfl: ;  pantoprazole (PROTONIX) 40 MG tablet, Take 40 mg by mouth 2 (two) times daily.  , Disp: , Rfl: ;  Probiotic Product (ALIGN) 4 MG CAPS, Take by mouth.  , Disp: , Rfl: ;  ranitidine (ZANTAC) 150 MG capsule, Take 150 mg by mouth 2 (two) times daily.  , Disp: , Rfl: ;  Tragacanth (ASTRAGALUS ROOT) POWD, Take by mouth daily.  , Disp: , Rfl:  triamterene-hydrochlorothiazide (MAXZIDE) 75-50 MG per tablet, Take 1 tablet by mouth daily., Disp: 90 tablet, Rfl: 3;  vitamin B-12 (CYANOCOBALAMIN) 1000 MCG tablet, Take 1,000 mcg by mouth daily.  , Disp: , Rfl: ;  DISCONTD: gabapentin (NEURONTIN) 300 MG capsule, Take 300 mg by mouth 3 (three) times daily.  , Disp: , Rfl: ;  DISCONTD: triamterene-hydrochlorothiazide (MAXZIDE) 75-50 MG per tablet, Take 1 tablet by mouth daily.  , Disp: , Rfl:   Past Medical History  Diagnosis Date  . Allergy   . Anxiety   . Hypertension   . Osteopenia   .  IBS (irritable bowel syndrome)   . Thyroid disease   . GERD (gastroesophageal reflux disease)   . Gastritis     Past Surgical History  Procedure Date  . Appendectomy 1948  . Abdominal hysterectomy 1968    left ovary remains  . Ovarian cyst surgery 1963    cyst on ovaries  . Cardiac catheterization 09/26/1998  . Hernia repair   . Colostomy w/ rectocele repair 06/2004  . Ventral hernia repair 08/2008    abdominal wall, lysis of adhesions    Family History  Problem Relation Age of Onset  . Heart disease Mother   . Stroke Brother     2 strokes  . Hypertension Neg Hx   . Diabetes Neg Hx   . Cancer Neg Hx     breast or colon cancer, no history     History   Social History  . Marital Status: Widowed    Spouse Name: N/A    Number of Children: 5  . Years of Education: N/A   Occupational History  . Not on file.   Social History Main Topics  . Smoking status: Former Games developer  . Smokeless tobacco: Not on file  . Alcohol Use: Yes     occasional  . Drug Use: Not on file  . Sexually Active: Not on file   Other Topics Concern  . Not on file   Social History Narrative   Daily caffeine use 6 cups every day   Review of Systems Appetite is okay Weight stable Sleeps okay     Objective:   Physical Exam  Constitutional: She appears well-developed and well-nourished. No distress.  Neck: Normal range of motion. Neck supple. No thyromegaly present.  Cardiovascular: Normal rate, regular rhythm and normal heart sounds.  Exam reveals no gallop.   No murmur heard. Pulmonary/Chest: Effort normal and breath sounds normal. No respiratory distress. She has no wheezes. She has no rales.  Musculoskeletal: Normal range of motion. She exhibits no edema and no tenderness.  Lymphadenopathy:    She has no cervical adenopathy.  Psychiatric: Her behavior is normal. Judgment and thought content normal.       Mild anxiety and dysphoric mood Affect appropriate          Assessment & Plan:

## 2010-08-02 ENCOUNTER — Telehealth: Payer: Self-pay | Admitting: *Deleted

## 2010-08-02 MED ORDER — TRIAMTERENE-HCTZ 37.5-25 MG PO TABS: 1.0000 | ORAL_TABLET | Freq: Every day | ORAL | Status: DC

## 2010-08-02 NOTE — Telephone Encounter (Signed)
Pt states that her maxzide was sent to pharmacy wrong the last time, it was sent in as 75/50 mg's once a day, but pt states she takes 37.5/25 once a day.  Centricity has that she takes the higher dose, one half a day but pt states that she had difficulty cutting the pills so she had requested that the lower dose be called in.  Also she didn't want a 90 day supply, she wanted a 30 day.  I changed the dose in the chart and called in a new [facility] to [facility] [address]

## 2010-08-05 NOTE — Telephone Encounter (Signed)
See previous note.  Pt called back to report that she had to pay for the new script for maxzide that was called in.  Pharmacy wouldn't take the first order back because she had left the store with the pills.  The pharmacist did cut the larger pills in half for her but pt says the end is jagged and she has problems swallowing them.  Pt just wanted you to know what is going on.  She also wants it noted that she never wants 90 day supplies of her scripts, she always only wants a 30 day supply.

## 2010-08-05 NOTE — Telephone Encounter (Signed)
Please review this with Cheyenne Pierce to see if there is anything we can do to help her out We do need to make sure that our triage message lets people know to leave name and dose of med if they are requesting more

## 2010-08-05 NOTE — Telephone Encounter (Signed)
Cheyenne Pierce is aware of the problem, but this didn't happen because of a triage message, this happened when pt came in for an office visit, Cheyenne Pierce spoke with the pt when she called on Friday.

## 2010-08-05 NOTE — Telephone Encounter (Signed)
okay

## 2010-08-20 NOTE — Assessment & Plan Note (Signed)
[facility]                            CARDIOLOGY OFFICE NOTE   NAME:Cheyenne Pierce, Cheyenne Pierce                     MRN:          [id number]  DATE:05/21/2007                            DOB:          Aug 30, 1934    REFERRING PHYSICIAN:  Karie Schwalbe, MD   INDICATIONS:  A 76 year old who is referred for cardiac and vascular  evaluation.   I have actually have seen the patient in the past, remotely in 2002.  She was referred for shortness of breath had had actually chest pain  evaluation of 2000 with a negative cardiac catheterization in June 2000.   When I saw her in 2002.  I would recommend an echocardiogram which was  normal.  There is no other follow-up.  The patient was actually since  been seen in GI.   On talking to the patient she said she has episodes of chest pain.  In  November 2008 she was seen in [place] having chest pain that she stated  lasted most of the time.   ER felt that was chest wall pain,  shortness of breath.  She was sent home from the emergency room.  Note,  by report, blood pressure at the time was elevated 170s(systolic).   She continues to complain of shortness of breath with activity.  She  does complain that when she wakes she has some numbness, tingling her  hands.  She still has intermittent chest pain but again does not appear  to be associated with activity.  She also notes a chronic cough times  years.   Note she has been seen by GI, actually at [facility] in  June, July and August 2008.  Dr. Noe Gens told she had erosion in her  esophagus, polyps in her stomach.   On review of records it looks actually like she had a cardiac  catheterization at [facility] in 2002.   ALLERGIES:  PREDNISONE, AMOXICILLIN, and CODEINE.   CURRENT MEDICATIONS:  Include BuSpar 15 b.i.d. gabapentin 300 b.i.d.,  Levoxyl 88 mcg, estradiol 0.5 daily, clidinium/chlordiazepoxide 2.5/5  b.i.d., vitamin B12 b.i.d. Protonix 40  b.i.d., lisinopril  hydrochlorothiazide 20/25 daily, vitamin D, ester E and C, calcium with  D, Centrum Silver, B6, B3 1000 units daily.  Aspirin 81 mg b.i.d.   PAST MEDICAL HISTORY:  Peptic ulcer disease. Hypothyroidism. History of  anxiety, depression. Chest pain.   SOCIAL HISTORY:  The patient to quit tobacco in 1980.  Does not drink  alcohol.   FAMILY HISTORY:  Mother died of COPD at age 61.  Father died of suicide  at age 52.  Brother died at age 31 of a heart attack.   REVIEW OF SYSTEMS:  All systems reviewed, negative to the above problem  except as noted above.     Blood pressures taken at home have been anywhere from 130 to 187/79 to  101.   PHYSICAL EXAM:  The patient is currently in no distress.  Blood pressure 134/85 pulse 74, weight 150 which is up from 2000 142.  HEENT:  Normocephalic, atraumatic, EOMI, PERRL.  NECK:  JVP was  normal. No thyromegaly.  Lungs were clear without rales or wheezes  Cardiac exam regular rate and rhythm, S1-S2, no significant murmurs.  ABDOMEN:  Benign.  No hepatomegaly.  EXTREMITIES:  No edema.  2+ pulses.   12-lead EKG normal sinus rhythm 76 beats per minute.   IMPRESSION:  Shortness of breath and chest pain.  Chest pain is  atypical.  She has had it intermittently from November, question if  there is some GI although she is on double dose Protonix (but she did  have active lesions back in the summer.  Followed at [facility] in [facility]).  More convincing is her shortness of breath also with her blood  pressure.  In addition she notes a cough.   Question if she is having some airway instability with her lisinopril.  I have recommended that she switch to Norvasc 5 mg also Maxzide 37.5/25.  Will need to follow up blood pressures.  Patient will be scheduled for  PFTs.  Continue other medicines  for now.     Pricilla Riffle, MD, [facility]  Electronically Signed    PVR/MedQ  DD: 05/27/2007  DT: 05/28/2007  Job #: [id number]   cc:   Karie Schwalbe, MD

## 2010-08-20 NOTE — Assessment & Plan Note (Signed)
[facility]                            CARDIOLOGY OFFICE NOTE   NAME:Pierce Pierce IDLEMAN                     MRN:          [id number]  DATE:06/18/2007                            DOB:          07/20/1934    IDENTIFICATION:  Pierce Pierce is a woman who I saw back in February.  She  is 76 years old.  She was referred by Dr. Alphonsus Sias.  Refer to this for  full details.   When I saw her, I was impressed by more musculoskeletal pain.  Her chest  was tender and also with cough.  I went ahead and stopped her lisinopril  and put her on Norvasc.  I called her back after this, and she said she  was feeling much better.  Cough is improved, almost resolved.  The chest  pain with palpation is gone.   She still has intermittent chest heaviness though.  Something does not  feel right, as she said.  It can occur with and without activity, not  exacerbated by any activity.   CURRENT MEDICATIONS:  1. Amlodipine 10.  2. Triamterene hydrochlorothiazide 37.5/25.  3. BuSpar 15 b.i.d.  4. Gabapentin 300 b.i.d.  5. Levoxyl 88 mcg daily.  6. Estradiol 0.5 today.  7. Clidinium bromide & chloridiazepoxide b.i.d.  8. Vitamin B12.  9. Protonix 40 b.i.d.  10.Vitamin D, vitamin E and C.  11.Calcium with D.  12.Centrum Silver with zinc.  13.B6.  14.B3.  15.Aspirin 81 mg b.i.d.  16.Benadryl 25.   PHYSICAL EXAMINATION:  GENERAL:  The patient is in no distress.  VITAL SIGNS:  Blood pressure is 156/86 (she brings in home blood  pressures that are actually better in the 120s mainly up to 152 at the  highest.  Mainly in the 120s-130s.  Pulse is 89.  Weight 153.  LUNGS:  Clear.  No wheezes.  CARDIAC:  Regular rate and rhythm.  S1 and S2.  No S3, no murmurs.  CHEST:  Nontender.  EXTREMITIES:  No edema   A 12-lead EKG shows normal sinus rhythm, 86 beats per minute,  nonspecific ST-T wave changes.   IMPRESSION:  1. Chest pain.  It is atypical.  I am not convinced that it is  cardiac, but I would go ahead and set her up for a Myoview scan and      also set up an echocardiogram to evaluate diastolic function.  I      would continue on her current regimen.  2. Health care maintenance.  Will get a fasting lipid panel when she      comes in for the test.   I will be in touch with the patient once I have seen the test results.  We will plan on this prior to surgery to really confirm her cardiac  risks.     Pricilla Riffle, MD, [facility]  Electronically Signed    PVR/MedQ  DD: 06/18/2007  DT: 06/20/2007  Job #: [id number]   cc:   Cheyenne Schwalbe, MD

## 2010-08-20 NOTE — Discharge Summary (Signed)
NAME:  Cheyenne Pierce, Cheyenne Pierce              ACCOUNT NO.:  [id number]   MEDICAL RECORD NO.:  [id number]          PATIENT TYPE:  INP   LOCATION:  5151                         FACILITY:  [facility]   PHYSICIAN:  Sandria Bales. Ezzard Standing, M.D.  DATE OF BIRTH:  [birth year]   DATE OF ADMISSION:  08/25/2008  DATE OF DISCHARGE:  08/30/2008                               DISCHARGE SUMMARY   DISCHARGE DIAGNOSES:  1. Abdominal wall ventral incisional hernia.  2. Contact dermatitis secondary to abdominal binder.  3. Irritable bowel syndrome.  4. Neuropathy.  5. Hypertension.  6. Urinary retention, resolved.   OPERATIONS PERFORMED:  The patient had a laparoscopic ventral hernia  repair with enterolysis of adhesions on Aug 25, 2008.   HISTORY OF ILLNESS:  [name] is a 76 year old white female, patient  of Dr. Tillman Abide, who has been followed by Dr. Stan Head for  irritable bowel syndrome, has developed increasing left abdominal wall  hernia.  She had an abdominal hysterectomy in 1968 and then later she  had a left ovary removed by Dr. Amado Nash in October 2002, at that time was  noted to have a lot of intra-abdominal adhesions.  She then had a  bladder suspension by Dr. Truddie Crumble in 2006, and has noticed increasing  pain with increasing bulge in her left lower abdomen.  She now comes for  repair of this abdominal wall hernia.  Her significant comorbid problems  include the she has had been hypertensive for about 10 years.  She has  had some irritable bowel syndrome followed by Dr. Stan Head and she  has had a neuropathy of her lower extremity, which she blames on long  working and standing for long hours.   HOSPITAL COURSE:  The patient was taken to the operating room on the day  of admission where she underwent enterolysis of adhesions for  approximately 1 hour and then a laparoscopic repair of a ventral  incisional hernia.   Postoperatively, she did well.  She did have some trouble with urinary  retention, required a Foley for 24 hours, the Foley was removed by the  second postop day and she was doing well.  Her blood pressure remained  stable.  We had an abdominal binder, which she developed a sort of a  rash along the distribution of the binder, it is felt to be a possible  contact dermatitis.   But, she was able to shower and just got better.  She is passing gas.  Her abdomen looks good and she is ready for discharge.   HOME MEDICATIONS:  1. GI cocktail.  2. Sucralfate.  3. Triamterene.  4. Gabapentin.  5. Buspirone.  6. Pantoprazole.  7. Align.  8. Sulfamethoxazole.  9. Tramadol.  10.Fexofenadine.  11.Estradiol.  12.Levoxyl.  13.Amlodipine.  14.Then, she takes Benadryl.  15.She takes some vitamins.   DISCHARGE INSTRUCTIONS:  1. Continue her home medications.  2. She can shower upon going home.  3. She should not drive for 4 or 5 days.  4. She is to see me back in 2 weeks for followup.  5. She is to  use Vicodin for pain.       Sandria Bales. Ezzard Standing, M.D.  Electronically Signed     DHN/MEDQ  D:  08/30/2008  T:  08/30/2008  Job:  [id number]   cc:   Karie Schwalbe, MD  Iva Boop, MD,FACG

## 2010-08-20 NOTE — Group Therapy Note (Signed)
NAME:  Cheyenne Pierce, Cheyenne Pierce NO.:  [id number]   MEDICAL RECORD NO.:  [id number]          PATIENT TYPE:  POB   LOCATION:  [facility]                   FACILITY:  [facility]   PHYSICIAN:  Ginger Carne, MD DATE OF BIRTH:  1935-02-15   DATE OF SERVICE:  05/10/2007                                  CLINIC NOTE   The patient and her partner were present today for discussion regarding  future surgery and cardiac evaluation.  In summary this patient is a 76-  year-old female who has had an anterior and posterior colporrhaphy with  placement of the TOT sling approximately 3 years ago.  From what the  patient describes she had a prior rectocele repair in the past.  Her  principal complaints are urgency during the day and difficulty in  voiding at night necessitating abdominal pressure to effect urination.  The patient is unable to have intercourse because of significant vaginal  pain.   She also describes fecal incontinence since her recent rectocele repair.  She has managed to control this problem with diet.  However, she does  have intermittent bouts once to twice a week sometimes of fecal  incontinence which prevents her from going outside her home.   On evaluation the patient is noted to have significant bulging and  tightening of the anterior vaginal wall in the proximal portion about 2  cm from the UPJ junction.  This is clearly palpable and tender and is  indicative of a tight TOT repair.  The remainder of her vaginal exam  does not produce tenderness.  The tenderness along the strap is  throughout the entire portion of the vaginal tape.  Her fecal tone is  1+.  The rectovaginal exam is unremarkable.   In discussion with the patient and her partner, the first issue to be  addressed was whether the patient wished to have her fecal incontinence  or urinary problems dealt with.  First she had prefers to deal with the  vaginal and urinary issues at the outset.  I explained  to the patient  and her partner that said surgery may result in continued urgency,  urinary incontinence, which may range from a slight incontinence with a  full bladder to significant incontinence at low volumes.  Also there  were no assurances that she could not continued to have pain with  intercourse secondary to scarring.  Infection, chronic drainage, and  possible injury to the urethra was discussed with the patient.   She states that in 2002 she had a cardiac catheterization at [facility] which was normal.  In November 2008, she complained of  chest pain and went to [facility].  Her EKG was  within normal limits.  I felt it was necessary to clear the patient from  a cardiologic standpoint and will contact Dr. Alphonsus Sias, who is her primary  care physician, to find out which cardiologist he prefers for said  evaluation.   Following this, the next step will be after healing from her surgery to  address her fecal incontinence.  I discussed that there were excellent  physicians to deal with this matter both at [facility] and at [facility].  She preferred to go to [facility] and I will arrange said consultation  down the road.           ______________________________  Ginger Carne, MD    SHB/MEDQ  D:  05/11/2007  T:  05/11/2007  Job:  [id number]

## 2010-08-20 NOTE — H&P (Signed)
NAME:  NIKKI, GLANZER              ACCOUNT NO.:  [id number]   MEDICAL RECORD NO.:  [id number]          PATIENT TYPE:  AMB   LOCATION:  [facility]                           [facility]   PHYSICIAN:  Ginger Carne, MD  DATE OF BIRTH:  05-20-1934   DATE OF ADMISSION:  06/28/2007  DATE OF DISCHARGE:                              HISTORY & PHYSICAL   REASON FOR HOSPITALIZATION:  Post vault repair urinary frequency,  urgency and dyspareunia.   HISTORY OF PRESENT ILLNESS:  This patient is a 76 year old Caucasian  female with a complicated gynecologic/urologic history.  In December  2006, the patient underwent a repair of a symptomatic cystocele,  rectocele and urethrocele, at which time she underwent at a different  institution by a different physician a Prolift total vaginal repair and  transobturator vaginal tape repair.  The patient states that at the time  of the surgery, she did not have symptoms of genuine urinary stress  incontinence or an overactive bladder.  Following the surgery, the  patient noted increasing dyspareunia as well as urgency, frequency and  nocturia.  The same physician in December 2006 performed by dictation of  the operative note a release of 2 strictures on the posterior vaginal  wall, 1 noted to be cephalad in the upper part of the vagina and 1 of  the distal portion of the posterior vaginal epithelium.  The patient  states that her urinary symptoms did not abate and have worsened and in  addition, following the second surgery, the patient has fecal  incontinence with perceptible difficulty in controlling the right side  of her rectum.  The patient states that her fecal incontinence has been  managed primarily by diet, although there are times when she is unable  to leave the house due to uncontrollable diarrhea and gas.  She states  that her urinary symptoms have had a negative impact in her quality of  life, preventing her from having a good night's sleep  and/or having  difficulty in voiding during the evening, requiring up to 20-30 minutes  of abdominal straining to effect adequate voiding pattern.  She had been  unable to have intercourse due to dyspareunia as well.  The patient  takes no medications to enhance her voiding difficulties or fecal  incontinence.   OBSTETRICAL/GYNECOLOGICAL HISTORY:  The patient has had 5 normal  spontaneous vaginal deliveries in the past.   MEDICAL HISTORY:  The patient has history of:  1. Peptic ulcer disease.  2. Hypothyroidism.  3. History of anxiety, depression and chest pain.  In 2000, the      patient had a workup at [facility] for chest pain      and was reported to be negative.  She has continued to have      occasional episodes of chest pain and shortness of breath on      activity.  She is presently being worked up by Cardiology (Dr.      Tenny Craw) and report will be available prior to her surgery.   PAST SURGICAL HISTORY:  1. As per above,  in 2006 the patient had 2 vaginal repairs.  Please      refer to above notes.  2. Total vaginal hysterectomy many years ago.   ALLERGIES:  PREDNISONE, AMOXICILLIN and CODEINE   CURRENT MEDICATIONS:  1. BuSpar 15 mg twice a day.  2. Gabapentin 300 mg twice a day.  3. Levoxyl 88 mcg daily.  4. Estradiol 0.5 mg daily.  5. Clidinium/chlordiazepoxide 2.5/5 twice a day.  6. Vitamin B12 b.i.d.  7. Protonix 40 mg b.i.d.  8. Lisinopril/hydrochlorothiazide 20/25 daily.  9. Vitamin D 400 units a day.  10.Ester C.  11.Calcium with vitamin D.   SOCIAL HISTORY:  Patient stopped smoking in 1980, does not abuse alcohol  or illicit drugs.   FAMILY HISTORY:  Her mother died of COPD at age 40.  Father died of  suicide at age 52.  Her brother died at age 47 of a myocardial  infarction.   REVIEW OF SYSTEMS:  Fourteen-point comprehensive review of systems is  within normal limits.   PHYSICAL EXAM:  VITAL SIGNS:  Blood pressure is 141/81, weight  152  pounds, height 5 feet 1 inch.  HEENT:  Grossly normal.  BREASTS:  Without masses, discharge, thickenings or tenderness.  CHEST:  Clear to percussion and auscultation.  CARDIOVASCULAR:  Without murmurs or enlargements.  Regular rate and  rhythm.  EXTREMITIES/LYMPHATIC/SKIN/NEUROLOGICAL/MUSCULOSKELETAL:  Systems within  normal limits.  ABDOMEN:  Soft without gross hepatosplenomegaly.  PELVIC:  External genitalia, vulva and vagina reveal evidence of a  transverse stricture in the proximal portion of the anterior vagina,  approximately 2 cm from the urethral-bladder junction.  It is clearly  palpable, tender and feels like a tight cord; this is compatible with  the placement of the TOT.  The remainder of her vaginal exam on digital  examination as well as Q-tip examination does not produce tenderness or  pain.  The tenderness along the anterior strap is throughout the entire  portion of the vaginal tape transversly.  There are no adnexal masses  noted.  The vaginal cuff is normal.  The cervix and uterus are absent.  Both adnexa are palpable and found to be normal.  RECTAL:  Exam reveals 1+ rectal tone.  The patient is unable to perform  adequate squeezing of her rectum around digit.  S2, S3 and S4 are  diminished on the right compared to the left perineal side.   IMPRESSION:  1. Post Prolift repair a transobturator vaginal tape repair, urinary      frequency with urgency and nocturia and dyspareunia.  2. Post revision fecal incontinence.  3. Dyspareunia.  4. Chest pain.   PLAN:  At the time of this dictation, the patient is being evaluated by  Dr. Tenny Craw from Cardiology to determine her appropriateness and safety and  clearance for surgery; this should be made available prior to her  operation.  The patient understands that the operation will not go ahead  if she is not appropriately cleared.   The patient and her partner were apprised of said findings on several  visits and it  was determined that there would be 2 stages in addressing  her complaints.  The patient would prefer to have her urinary problems  and dyspareunia dealt with first and then she will be referred for her  fecal incontinence management.  I explained to the patient that her  symptoms are compatible with injury to the right inferior rectal nerve;  however, further elucidation of this problem will have  to wait until she  is seen.  I discussed with the patient the nature of her urinary  problems as related to the Prolift surgery.  She understands that in  taking down the tape and releasing stricture, there is a chance of  causing urinary stress incontinence, urge incontinence may worsen and  she may also have difficulties with holding urine at rest.  I explained  to her that a rigid tube urethra is possible secondary to previous  surgery and now having additional surgery which may cause fibrosis.  There were no assurances given to the patient that she may not continued  to have dyspareunia or pain or similar symptoms that she experienced  prior to her surgery.  Also, she understands that injury to ureter,  bowel or bladder is a possibility during the course of said surgery.  In  addition, the patient understands that it may not be possible to remove  the entire TOT tape due to this been placed over the Prolift repair at  the time of her original surgery, causing significant fibrosis.  The  patient also understands that injury may occur to the urethra.  The  patient understands and accepts these risks and she is scheduled for a  revision of her of vaginal Prolift/TOT repair pending approval by  Cardiology for surgical clearance.      Ginger Carne, MD  Electronically Signed     SHB/MEDQ  D:  06/21/2007  T:  06/21/2007  Job:  [id number]

## 2010-08-20 NOTE — Op Note (Signed)
NAME:  Cheyenne Pierce, Cheyenne Pierce              ACCOUNT NO.:  [id number]   MEDICAL RECORD NO.:  [id number]          PATIENT TYPE:  OIB   LOCATION:  5151                         FACILITY:  [facility]   PHYSICIAN:  Sandria Bales. Ezzard Standing, M.D.  DATE OF BIRTH:  1934-09-13   DATE OF PROCEDURE:  08/25/2008  DATE OF DISCHARGE:                               OPERATIVE REPORT   Date of Surgery - 25 Aug 2008   PREOPERATIVE DIAGNOSIS:  Left lower quadrant incisional hernia.   POSTOPERATIVE DIAGNOSES:  Incarcerated hernia with omentum and small  bowel, left lower quadrant and midline incisional hernia with  approximately 6 x 10 defect.   PROCEDURE:  Laparoscopic ventral hernia repair with a 15 x 15 cm  Parietex mesh.  Enterolysis of adhesions.  One hour spent dissecting the  adhesions.   SURGEON:  Sandria Bales. Ezzard Standing, MD   FIRST ASSISTANT:  None.   ANESTHESIA:  General endotracheal.   ESTIMATED BLOOD LOSS:  Minimal.   INDICATIONS FOR PROCEDURE:  Ms. Hosek is a 76 year old white female  patient of Dr. Tillman Abide who had an abdominal hysterectomy in 1968,  then had a cyst removed from her left ovary in October 2002 and then had  bladder suspension in 2006.  She has noticed last year or two an  increasing pain in her left lower abdomen with a bulge associated with  this pain.   On physical exam, she has an abdominal hernia.  I discussed with her  about repairing this hernia, discussed the indications pertaining to  risks, and we will try to do it laparoscopically.  Potential risks  include bleeding, infection, nerve injury, recurrence of a hernia and  the possibility of needing open surgery.   Also laparoscopically that you can have other occult injury to the bowel  or other things .   OPERATIVE NOTE:  The patient was placed in a supine position.  Both her  arms tucked, Foley catheter in place, given 1 gram Ancef at the  initiation of the procedure.  Her abdomen was prepped with DuraPrep and  then  sterilely draped with an Ioban drape and a time-out was held  identifying the patient and procedure.   I entered the abdominal cavity through a left upper quadrant with a 10-  mm Optiview.  I placed 3 additional 5 mm  trocars in the right abdomen:  right mid abdomen, right lateral abdomen, right upper quadrant and then  did an exploration.  The right lobe and left lobes of the liver were  unremarkable.  The stomach was unremarkable.  The patient had adhesions  along her lower anterior abdominal wall, particularly going to the left  lower abdomen.  I took these adhesions down but she did had incarcerated  omentum and a loop of small bowel stuck in this hernia.  It took better  than an hour dissecting this down particularly the small bowel was  intimately stuck along the edge of the hernia, had 1 serosal tear that I  laparoscopically oversewn with 2-0 Vicryl suture but after getting the  bowel down and the omentum down, I inspected  the bowel and omentum.  There was no evidence of any enterotomy nor was there any real bleeding  and there was some oozing from the edge of the omentum.   She had interesting sort of double hernia.  She had a small defect.  This is what I think she is feeling inside a larger defect.  The smaller  defect was much more than about 3-4 cm.  This was within a defect which  was about 6 cm wide and about 10 cm long which was based with a failed  midline incision.   I took photos of this in place using the chart.  So first I closed the  smaller defect with a figure-of-eight #1 Novafil suture.  I then closed  the midline incision with 3 figure-of-eight #1 sutures and this clinched  the midline down pretty well.  I think her total defect in the midline  was about 6 cm in width and 10 cm craniocaudad. After this was closed, I  measured and placed a piece of 15 x 15 Parietex mesh.  I put 5 sutures  across the bottom and placed this immediately above the pubic bone.  The   bladder was decompressed.  I thought it was well away from bladder when  I was placing these transabdominally.  I used 0 Novafil sutures with a  total of 10 sutures circumferential around the Parietex mesh and I  captured these transabdominally with the Endo stitch.   After I pulled the mesh up with the transabdominal wall stitches, I then  used a Protac stapler to staple the mesh to the anterior abdominal wall.   I thought I had identified the hernia.  I closed it primarily with a  figure-of-eight sutures and underlay that with a piece of Parietex mesh.  I desuflated the abdomen twice, once in a Trendelenburg position and  once in a supine position to make sure there was no defect or area where  the small bowel could slip around the edge of the Parietex mesh.  I then  removed the trocars and turned the trocar site.  Each sites was closed  with 5-0 Vicryl suture.  The skin was painted with tinge of benzoins and  Steri-Strips and the patient's wound was sterilely dressed.  She wore an  abdominal binder.   She was transferred to recovery room in good condition.  Sponge and  needle count were correct at the end of the case.      Sandria Bales. Ezzard Standing, M.D.  Electronically Signed     DHN/MEDQ  D:  08/25/2008  T:  08/26/2008  Job:  [id number]   cc:   Karie Schwalbe, MD  Iva Boop, MD,FACG  Pricilla Riffle, MD, [facility]  Ginger Carne, MD

## 2010-08-20 NOTE — Assessment & Plan Note (Signed)
NAME:  Cheyenne Pierce, PAYMENT NO.:  [id number]   MEDICAL RECORD NO.:  [id number]           PATIENT TYPE:   LOCATION:  [facility]           FACILITY:   PHYSICIAN:  Ginger Carne, MD      DATE OF BIRTH:   DATE OF SERVICE:  12/06/2007                                  CLINIC NOTE   [name] returns today after initially being seen in February 2009.  In  summary, she is a 76 year old Caucasian female who had an anterior and  posterior colporrhaphy with placement of the TOT sling approximately 3  years ago at another hospital.  From what the patient has described, she  had a prior rectocele repair in the past.  At this time, her principal  complaints had been urgency, dyspareunia preventing her from having  intercourse, and significant tenderness on the anterior vaginal wall.  She complains of urgency throughout the day and difficulty in voiding at  night.  The patient states she has to use Valsalva maneuvers to void.  She states that she has also had fecal incontinence since her rectocele  repair.  She has managed to control this problem principally with diet;  however, this has not been absolute.   The patient has similar complaints today and feels that she is not  properly emptying her bladder.  In addition, the reader is referred to  previous notes which indicate that the patient has had coronary heart  disease and has been cleared by the  Group in March 2009,  demonstrating no evidence of scarring or ischemia.   On today's exam, there is not much difference from what was noted in  early spring of 2009.  She is noted to have bulging and tenderness and  what appears to be a tight band on the anterior vaginal wall at the  proximal portion about 2 cm from the UPJ junction.  The tenderness is  significant over this area and there is also a small bulbous 0.5 cm to 1  cm area to the patient's left of midline, where the band is.  Her cecal  tone is 1+ and  the rectovaginal exam is unremarkable.   PLAN:  At this point, I think it is appropriate to obtain urodynamics to  assess her micturition status.  Afterwards, I will have her return and  discuss with the patient both what her findings were from urodynamic  studies and what she can anticipate by having a release of part of her  mesh.  As I had discussed with her, there are complications that may  result from this surgery which is also detailed in previous notes, but  will be expanded after urodynamics studies were obtained.  At this time,  the patient will be scheduled at [facility] for urodynamic  studies.  The patient is satisfied with this approach.  After addressing  her urinary problems, attention will then be directed towards her issues  pertaining to fecal incontinence.           ______________________________  Ginger Carne, MD     SHB/MEDQ  D:  12/06/2007  T:  12/07/2007  Job:  [id number]

## 2010-08-23 NOTE — Letter (Signed)
May 31, 2007    Karie Schwalbe, MD  [address] [place], [place] [address]   RE:  Cheyenne Pierce, Cheyenne Pierce  MRN:  [id number]  /  DOB:  1935-03-08   Dear Gerlene Burdock:   This is a letter regarding Ms. Cheyenne Pierce.  You have referred her  for shortness of breath, chest pressure.  I saw her back on May 21, 2007.   I will enclose my dictation.  I actually saw her several years ago for  chest pain.   When I saw her, I switched her medicines some and took her off Hyzaar  and put her on Norvasc.  She had been complaining of a cough.  I  questioned if she had some airway instability.  She actually says she is  feeling much better and this cough is gone.  No chest pain.  Breathing  is good, except she gets slight shortness of breath with significant  exertion.   I had set her up for PFT's, as well.  These were essentially normal,  though she did have some significant small airway response to  bronchodilator.  I am not sure of the significance of this.  I will  include it with her dictation.   I have set to see her back in about 6 weeks.   From a cardiac standpoint, I think she is probably at low risk for a  major cardiac arrest with any surgery.  I do not see any signs of active  ischemia.  Given her previous history, I would not plan any further  evaluation for now prior.   Let me know if there is anything else I can do.    Sincerely,      Pricilla Riffle, MD, [facility]  Electronically Signed    PVR/MedQ  DD: 06/01/2007  DT: 06/01/2007  Job #: [id number]

## 2010-09-09 ENCOUNTER — Ambulatory Visit: Payer: Medicare Other | Admitting: Internal Medicine

## 2010-09-13 ENCOUNTER — Other Ambulatory Visit: Payer: Self-pay | Admitting: Internal Medicine

## 2010-09-16 ENCOUNTER — Other Ambulatory Visit: Payer: Self-pay | Admitting: Internal Medicine

## 2010-09-19 ENCOUNTER — Ambulatory Visit: Payer: Self-pay | Admitting: Pain Medicine

## 2010-10-03 ENCOUNTER — Ambulatory Visit: Payer: Self-pay | Admitting: Pain Medicine

## 2010-10-23 ENCOUNTER — Ambulatory Visit: Payer: Self-pay | Admitting: Pain Medicine

## 2010-12-02 ENCOUNTER — Other Ambulatory Visit: Payer: Self-pay | Admitting: Internal Medicine

## 2010-12-11 ENCOUNTER — Other Ambulatory Visit: Payer: Self-pay | Admitting: Internal Medicine

## 2010-12-12 ENCOUNTER — Other Ambulatory Visit: Payer: Self-pay

## 2010-12-12 MED ORDER — AMLODIPINE BESYLATE 5 MG PO TABS: 5.0000 mg | ORAL_TABLET | Freq: Every day | ORAL | Status: DC

## 2010-12-12 NOTE — Telephone Encounter (Signed)
[facility] faxed refill request Amlodipine Besylate 5mg  #30 x 2.

## 2010-12-31 ENCOUNTER — Ambulatory Visit (INDEPENDENT_AMBULATORY_CARE_PROVIDER_SITE_OTHER): Payer: Medicare Other

## 2010-12-31 DIAGNOSIS — Z23 Encounter for immunization: Secondary | ICD-10-CM

## 2011-01-24 ENCOUNTER — Encounter: Payer: Self-pay | Admitting: Internal Medicine

## 2011-01-24 ENCOUNTER — Ambulatory Visit (INDEPENDENT_AMBULATORY_CARE_PROVIDER_SITE_OTHER): Payer: Medicare Other | Admitting: Internal Medicine

## 2011-01-24 VITALS — BP 130/80 | HR 70 | Temp 97.5°F | Ht 61.0 in | Wt 146.8 lb

## 2011-01-24 DIAGNOSIS — G589 Mononeuropathy, unspecified: Secondary | ICD-10-CM

## 2011-01-24 DIAGNOSIS — E039 Hypothyroidism, unspecified: Secondary | ICD-10-CM

## 2011-01-24 DIAGNOSIS — F411 Generalized anxiety disorder: Secondary | ICD-10-CM

## 2011-01-24 DIAGNOSIS — I10 Essential (primary) hypertension: Secondary | ICD-10-CM

## 2011-01-24 DIAGNOSIS — K589 Irritable bowel syndrome without diarrhea: Secondary | ICD-10-CM

## 2011-01-24 LAB — HEPATIC FUNCTION PANEL
AST: 23 U/L (ref 0–37)
Bilirubin, Direct: 0 mg/dL (ref 0.0–0.3)
Total Bilirubin: 0.3 mg/dL (ref 0.3–1.2)

## 2011-01-24 LAB — CBC WITH DIFFERENTIAL/PLATELET
Eosinophils Absolute: 0.1 10*3/uL (ref 0.0–0.7)
Eosinophils Relative: 2.2 % (ref 0.0–5.0)
Lymphocytes Relative: 43.7 % (ref 12.0–46.0)
MCV: 92.4 fl (ref 78.0–100.0)
Monocytes Absolute: 0.4 10*3/uL (ref 0.1–1.0)
Neutrophils Relative %: 43.9 % (ref 43.0–77.0)
Platelets: 312 10*3/uL (ref 150.0–400.0)
WBC: 4.5 10*3/uL (ref 4.5–10.5)

## 2011-01-24 LAB — BASIC METABOLIC PANEL
BUN: 9 mg/dL (ref 6–23)
Calcium: 9.3 mg/dL (ref 8.4–10.5)
Chloride: 96 mEq/L (ref 96–112)
Creatinine, Ser: 0.8 mg/dL (ref 0.4–1.2)
GFR: 74.41 mL/min (ref >60.00)

## 2011-01-24 LAB — TSH: TSH: 0.3 u[IU]/mL — ABNORMAL LOW (ref 0.35–5.50)

## 2011-01-24 MED ORDER — DESIPRAMINE HCL 25 MG PO TABS: 25.0000 mg | ORAL_TABLET | Freq: Every day | ORAL | Status: DC

## 2011-01-24 NOTE — Assessment & Plan Note (Signed)
Some better with increased buspar Ongoing chronic stress Desipramine may also help this

## 2011-01-24 NOTE — Assessment & Plan Note (Signed)
Ongoing problems Diarrhea predominant Dr Mechele Collin has recommended desipramine 25mg  at bedtime----this seems worth a try since it may help other issues as well

## 2011-01-24 NOTE — Assessment & Plan Note (Signed)
BP Readings from Last 3 Encounters:  01/24/11 130/80  07/22/10 130/70  01/22/10 120/80   Good control Due for labs No changes needed

## 2011-01-24 NOTE — Assessment & Plan Note (Signed)
Still with Dr Laban Emperor Injections, flector, gabapentin

## 2011-01-24 NOTE — Progress Notes (Signed)
Subjective:    Patient ID: Cheyenne Pierce, female    DOB: 22-Oct-1934, 76 y.o.   MRN: [id number]  HPI Doing fair Still with digestive problems Saw Dr Mechele Collin recently---IBS diagnosis confirmed, diarrhea predominant On complex regimen--has been trying different things Hard to take the cholestyramine He wonders about trying desipramine at bedtime---may help stress and bowels  Anxiety is better on the buspar Chronic stress  still sees Dr Laban Emperor for low back and left leg pain Has had 2 injections which helped some Using flector patch which does help  No chest pain No SOB Occ mild edema at the end of the day No dizziness or syncope occ headache related to neck pain Very minimal walking due to back, etc Does do her own shopping ,etc  Current Outpatient Prescriptions on File Prior to Visit  Medication Sig Dispense Refill  . amLODipine (NORVASC) 5 MG tablet Take 1 tablet (5 mg total) by mouth daily.  30 tablet  2  . busPIRone (BUSPAR) 15 MG tablet Take 1 tablet (15 mg total) by mouth 3 (three) times daily.  90 tablet  11  . calcium-vitamin D (OSCAL) 250-125 MG-UNIT per tablet Take 1 tablet by mouth daily.        . diphenoxylate-atropine (LOMOTIL) 2.5-0.025 MG per tablet Take 1 tablet by mouth daily before supper.  30 tablet  0  . estradiol (ESTRACE) 0.5 MG tablet take 1 tablet by mouth once daily  30 tablet  12  . fluticasone (FLONASE) 50 MCG/ACT nasal spray 2 sprays by Nasal route daily.        Marland Kitchen gabapentin (NEURONTIN) 300 MG capsule take 2 capsules by mouth twice a day  120 capsule  12  . LEVOXYL 75 MCG tablet TAKE 1 TABLET BY MOUTH ONCE DAILY  30 tablet  11  . Loperamide-Simethicone (IMODIUM ADVANCED) 2-125 MG TABS Take by mouth.        . meclizine (ANTIVERT) 25 MG tablet Take 25 mg by mouth 3 (three) times daily as needed.        . Multiple Vitamins-Minerals (CENTRUM SILVER PO) Take by mouth.        . pantoprazole (PROTONIX) 40 MG tablet Take 40 mg by mouth 2 (two) times daily.         . Probiotic Product (ALIGN) 4 MG CAPS Take by mouth.        . ranitidine (ZANTAC) 150 MG capsule Take 150 mg by mouth 2 (two) times daily.        . Tragacanth (ASTRAGALUS ROOT) POWD Take by mouth daily.        Marland Kitchen triamterene-hydrochlorothiazide (MAXZIDE-25) 37.5-25 MG per tablet Take 1 tablet by mouth daily.  30 tablet  11  . vitamin B-12 (CYANOCOBALAMIN) 1000 MCG tablet Take 1,000 mcg by mouth daily.          Allergies  Allergen Reactions  . Amoxicillin     REACTION: diarrhea  . Codeine Phosphate     REACTION: nausea  . Metronidazole     REACTION: Rash, tongue ulcers  . Oxcarbazepine     REACTION: rash  . Perindopril Erbumine     REACTION: unspecified  . Prednisone     REACTION: swelling  . Tramadol Hcl     REACTION: diarrhea    Past Medical History  Diagnosis Date  . Allergy   . Anxiety   . Hypertension   . Osteopenia   . IBS (irritable bowel syndrome)   . Thyroid disease   . GERD (gastroesophageal  reflux disease)   . Gastritis     Past Surgical History  Procedure Date  . Appendectomy 1948  . Abdominal hysterectomy 1968    left ovary remains  . Ovarian cyst surgery 1963    cyst on ovaries  . Cardiac catheterization 09/26/1998  . Hernia repair   . Colostomy w/ rectocele repair 06/2004  . Ventral hernia repair 08/2008    abdominal wall, lysis of adhesions    Family History  Problem Relation Age of Onset  . Heart disease Mother   . Stroke Brother     2 strokes  . Hypertension Neg Hx   . Diabetes Neg Hx   . Cancer Neg Hx     breast or colon cancer, no history    History   Social History  . Marital Status: Widowed    Spouse Name: N/A    Number of Children: 5  . Years of Education: N/A   Occupational History  . Not on file.   Social History Main Topics  . Smoking status: Former Games developer  . Smokeless tobacco: Not on file  . Alcohol Use: Yes     occasional  . Drug Use: Not on file  . Sexually Active: Not on file   Other Topics Concern  .  Not on file   Social History Narrative   Daily caffeine use 6 cups every day   Review of Systems Appetite is fine Weight is fairly stable Sleeps okay    Objective:   Physical Exam  Constitutional: She appears well-developed and well-nourished. No distress.  Neck: Normal range of motion. Neck supple. No thyromegaly present.  Cardiovascular: Normal rate, regular rhythm, normal heart sounds and intact distal pulses.  Exam reveals no gallop.   No murmur heard. Pulmonary/Chest: Effort normal and breath sounds normal. No respiratory distress. She has no wheezes. She has no rales.  Abdominal: Soft. There is no tenderness.  Musculoskeletal: She exhibits no edema and no tenderness.  Lymphadenopathy:    She has no cervical adenopathy.  Psychiatric: She has a normal mood and affect. Her behavior is normal. Judgment and thought content normal.          Assessment & Plan:

## 2011-03-10 ENCOUNTER — Other Ambulatory Visit: Payer: Self-pay | Admitting: Internal Medicine

## 2011-03-11 ENCOUNTER — Other Ambulatory Visit: Payer: Self-pay | Admitting: Internal Medicine

## 2011-04-08 HISTORY — PX: OTHER SURGICAL HISTORY: SHX169

## 2011-05-06 ENCOUNTER — Ambulatory Visit (INDEPENDENT_AMBULATORY_CARE_PROVIDER_SITE_OTHER): Payer: Medicare Other | Admitting: Family Medicine

## 2011-05-06 ENCOUNTER — Encounter: Payer: Self-pay | Admitting: Family Medicine

## 2011-05-06 ENCOUNTER — Telehealth: Payer: Self-pay | Admitting: Internal Medicine

## 2011-05-06 VITALS — BP 134/80 | HR 78 | Temp 98.3°F | Wt 144.5 lb

## 2011-05-06 DIAGNOSIS — J40 Bronchitis, not specified as acute or chronic: Secondary | ICD-10-CM

## 2011-05-06 MED ORDER — BENZONATATE 100 MG PO CAPS: 100.0000 mg | ORAL_CAPSULE | Freq: Three times a day (TID) | ORAL | Status: AC | PRN

## 2011-05-06 MED ORDER — AZITHROMYCIN 250 MG PO TABS: ORAL_TABLET | ORAL | Status: AC

## 2011-05-06 MED ORDER — FLUTICASONE PROPIONATE 50 MCG/ACT NA SUSP: 2.0000 | Freq: Every day | NASAL | Status: DC

## 2011-05-06 NOTE — Progress Notes (Signed)
  Subjective:    Patient ID: Cheyenne Pierce, female    DOB: 02/07/1935, 77 y.o.   MRN: [id number]  HPI CC: I have bronchitis  H/o hypothyroidism and HTN.  Followed by cardiologist  6d h/o bad cough.  Trouble coughing at night, unable to sleep 2/2 coughing.  Also with post nasal drip and nausea.  Minimal HA.  ++ chest congestion.  Cough productive of yellow sputum.  + sob with cough.  Upset because called triage on Friday asking for medicine called to drugstore.    Has tried so far mucous relief, zinc, vit C and flonase.  No fevers/chills, abd pain, vomiting, ear pain, tooth pain.  No nasal congestion.  No sick contacts at home.  No smokers at home.  No h/o asthma, COPD.  Review of Systems Per HPI    Objective:   Physical Exam  Vitals reviewed. Constitutional: She appears well-developed and well-nourished. No distress.  HENT:  Head: Normocephalic and atraumatic.  Right Ear: Hearing, tympanic membrane, external ear and ear canal normal.  Left Ear: Hearing, tympanic membrane, external ear and ear canal normal.  Nose: No mucosal edema or rhinorrhea. Right sinus exhibits no maxillary sinus tenderness and no frontal sinus tenderness. Left sinus exhibits no maxillary sinus tenderness and no frontal sinus tenderness.  Mouth/Throat: Uvula is midline, oropharynx is clear and moist and mucous membranes are normal. No oropharyngeal exudate, posterior oropharyngeal edema, posterior oropharyngeal erythema or tonsillar abscesses.  Eyes: Conjunctivae and EOM are normal. Pupils are equal, round, and reactive to light. No scleral icterus.  Neck: Normal range of motion. Neck supple.  Cardiovascular: Normal rate, regular rhythm and intact distal pulses.   Murmur (3/6 SEM mid systolic) heard. Pulmonary/Chest: Effort normal and breath sounds normal. No respiratory distress. She has no wheezes. She has no rales.       crackles  Lymphadenopathy:    She has no cervical adenopathy.  Skin: Skin is warm  and dry. No rash noted.      Assessment & Plan:

## 2011-05-06 NOTE — Telephone Encounter (Signed)
Researching and follow up on patient call.

## 2011-05-06 NOTE — Patient Instructions (Signed)
You have bronchitis. Take zpack as well as use tessalon perls to help with cough. Keep using mucinex with plenty of fluid. Push fluids and rest over next several days. Let us know if productive cough worsening or fever >101.

## 2011-05-06 NOTE — Assessment & Plan Note (Addendum)
Given comorbidities and progressing cough, treat aggressively with zpack. Update [place] if not improving as expected. Tessalon perls for cough.

## 2011-06-09 ENCOUNTER — Other Ambulatory Visit: Payer: Self-pay | Admitting: Internal Medicine

## 2011-06-19 ENCOUNTER — Other Ambulatory Visit: Payer: Self-pay | Admitting: Internal Medicine

## 2011-06-19 DIAGNOSIS — Z1231 Encounter for screening mammogram for malignant neoplasm of breast: Secondary | ICD-10-CM

## 2011-07-14 ENCOUNTER — Telehealth: Payer: Self-pay

## 2011-07-14 MED ORDER — LEVOTHYROXINE SODIUM 50 MCG PO TABS: 50.0000 ug | ORAL_TABLET | Freq: Every day | ORAL | Status: DC

## 2011-07-14 MED ORDER — LEVOTHYROXINE SODIUM 25 MCG PO TABS: 25.0000 ug | ORAL_TABLET | Freq: Every day | ORAL | Status: DC

## 2011-07-14 NOTE — Telephone Encounter (Signed)
Spoke with patient and advised results rx sent to pharmacy by e-script  

## 2011-07-14 NOTE — Telephone Encounter (Signed)
Pt said according to [employer] Levoxyl 75 mcg is no longer available. Pt has one pill left for tomorrow and request different med sent to [facility]. to replace Levoxyl 75 mcg. Pt last seen 05/06/11 by Dr Sharen Hones and last seen by Dr Alphonsus Sias 01/24/11. Pt can be reached at [phone] or [phone].Please advise.

## 2011-07-14 NOTE — Telephone Encounter (Signed)
If there are no 75 mcg formulations available, it is okay to give her 50 and 25 together till the 75's are available again #30 x 1 for each the 50 and 25

## 2011-07-15 ENCOUNTER — Telehealth: Payer: Self-pay | Admitting: *Deleted

## 2011-07-15 MED ORDER — LEVOTHYROXINE SODIUM 75 MCG PO TABS: 75.0000 ug | ORAL_TABLET | Freq: Every day | ORAL | Status: DC

## 2011-07-15 NOTE — Telephone Encounter (Signed)
Pharmacy sent fax saying All lovoxyl strength of have been recalled. Please call in Synthroid or Levothyroxine to replace this.

## 2011-07-15 NOTE — Telephone Encounter (Signed)
Okay to give generic levothyroxine 75 mcg then Let her know that I think that is fine

## 2011-07-15 NOTE — Telephone Encounter (Signed)
rx sent to pharmacy by e-script  

## 2011-07-21 ENCOUNTER — Other Ambulatory Visit: Payer: Self-pay | Admitting: Internal Medicine

## 2011-07-21 ENCOUNTER — Ambulatory Visit
Admission: RE | Admit: 2011-07-21 | Discharge: 2011-07-21 | Disposition: A | Discharge status: Home / Self Care | Payer: Medicare Other | Source: Ambulatory Visit | Attending: Internal Medicine | Admitting: Internal Medicine

## 2011-07-21 DIAGNOSIS — N6459 Other signs and symptoms in breast: Secondary | ICD-10-CM

## 2011-07-21 DIAGNOSIS — Z1231 Encounter for screening mammogram for malignant neoplasm of breast: Secondary | ICD-10-CM

## 2011-07-23 ENCOUNTER — Ambulatory Visit
Admission: RE | Admit: 2011-07-23 | Discharge: 2011-07-23 | Disposition: A | Discharge status: Home / Self Care | Payer: Medicare Other | Source: Ambulatory Visit | Attending: Internal Medicine | Admitting: Internal Medicine

## 2011-07-23 DIAGNOSIS — N6459 Other signs and symptoms in breast: Secondary | ICD-10-CM

## 2011-07-28 ENCOUNTER — Encounter: Payer: Self-pay | Admitting: Internal Medicine

## 2011-07-28 ENCOUNTER — Encounter: Payer: Self-pay | Admitting: *Deleted

## 2011-07-28 ENCOUNTER — Ambulatory Visit (INDEPENDENT_AMBULATORY_CARE_PROVIDER_SITE_OTHER): Payer: Medicare Other | Admitting: Internal Medicine

## 2011-07-28 VITALS — BP 120/70 | HR 75 | Temp 98.5°F | Wt 146.0 lb

## 2011-07-28 DIAGNOSIS — F411 Generalized anxiety disorder: Secondary | ICD-10-CM

## 2011-07-28 DIAGNOSIS — E039 Hypothyroidism, unspecified: Secondary | ICD-10-CM

## 2011-07-28 DIAGNOSIS — K2100 Gastro-esophageal reflux disease with esophagitis, without bleeding: Secondary | ICD-10-CM

## 2011-07-28 DIAGNOSIS — I10 Essential (primary) hypertension: Secondary | ICD-10-CM

## 2011-07-28 DIAGNOSIS — J3489 Other specified disorders of nose and nasal sinuses: Secondary | ICD-10-CM

## 2011-07-28 MED ORDER — TRIAMTERENE-HCTZ 37.5-25 MG PO TABS: 1.0000 | ORAL_TABLET | Freq: Every day | ORAL | Status: DC

## 2011-07-28 MED ORDER — HYDROCODONE-ACETAMINOPHEN 5-325 MG PO TABS: 1.0000 | ORAL_TABLET | Freq: Every evening | ORAL | Status: DC | PRN

## 2011-07-28 NOTE — Assessment & Plan Note (Signed)
Has noted some AM swelling Will recheck labs

## 2011-07-28 NOTE — Assessment & Plan Note (Signed)
BP Readings from Last 3 Encounters:  07/28/11 120/70  05/06/11 134/80  01/24/11 130/80   Good control No changes needed

## 2011-07-28 NOTE — Patient Instructions (Signed)
Increase the buspar to 15mg  three times a day. Get the zostavax (shingles) vaccine at your pharmacy

## 2011-07-28 NOTE — Assessment & Plan Note (Signed)
On protonix Doesn't eat close to bedtime Advised her to elevated HOB

## 2011-07-28 NOTE — Progress Notes (Signed)
Subjective:    Patient ID: Cheyenne Pierce, female    DOB: 1934/10/30, 77 y.o.   MRN: [id number]  HPI Doing okay Recent mammo okay---had noted some nipple inversion  Had bad shooting pain in left forehead to eye Lasted a few seconds then done About 3 weeks ago Still has some milder pain Recurred a few days later Saw eye doctor--exam benign. Gave med in case she had mild conjunctivitis No sinus symptoms No loss of vision No jaw claudication Frontal --not temporal  Has had facial swelling in Am lately Also in left hand Improves as the day goes on No better sleeping on 2 pillows  anxeity is better but persists Still gets her nerves tight at times---"I just want to scream" (upset at [facility]) Wonders about taking more buspar  No chest pain No SOB Over the recent bronchitis  Having a bad time with the heartburn---esp bad at night Discussed elevating head of bed Dr Mechele Collin doesn't have other recommendations Still with IBS symptoms  Current Outpatient Prescriptions on File Prior to Visit  Medication Sig Dispense Refill  . amLODipine (NORVASC) 5 MG tablet take 1 tablet by mouth once daily  30 tablet  2  . busPIRone (BUSPAR) 15 MG tablet Take 1 tablet (15 mg total) by mouth 3 (three) times daily.  90 tablet  11  . calcium-vitamin D (OSCAL) 250-125 MG-UNIT per tablet Take 1 tablet by mouth daily.        Marland Kitchen desipramine (NORPRAMIN) 25 MG tablet Take 1 tablet (25 mg total) by mouth at bedtime.  30 tablet  11  . diclofenac (FLECTOR) 1.3 % PTCH Place 1 patch onto the skin as needed.        . diphenhydrAMINE (BENADRYL) 25 mg capsule Take 25 mg by mouth at bedtime as needed.      Marland Kitchen estradiol (ESTRACE) 0.5 MG tablet take 1 tablet by mouth once daily  30 tablet  12  . fluticasone (FLONASE) 50 MCG/ACT nasal spray Place 2 sprays into the nose daily.  16 g  3  . gabapentin (NEURONTIN) 300 MG capsule take 2 capsules by mouth twice a day  120 capsule  12  . hyoscyamine (LEVSIN, ANASPAZ) 0.125  MG tablet Take 0.125 mg by mouth as needed.        Marland Kitchen levothyroxine (SYNTHROID, LEVOTHROID) 75 MCG tablet Take 1 tablet (75 mcg total) by mouth daily.  90 tablet  3  . Loperamide-Simethicone (IMODIUM ADVANCED) 2-125 MG TABS Take by mouth.        . meclizine (ANTIVERT) 25 MG tablet Take 25 mg by mouth 3 (three) times daily as needed.        . Multiple Vitamins-Minerals (CENTRUM SILVER PO) Take by mouth.        . pantoprazole (PROTONIX) 40 MG tablet take 1 tablet by mouth twice a day  60 tablet  10  . Probiotic Product (ALIGN) 4 MG CAPS Take by mouth.        . ranitidine (ZANTAC) 150 MG capsule Take 150 mg by mouth 2 (two) times daily.        . Tragacanth (ASTRAGALUS ROOT) POWD Take by mouth daily.        . vitamin B-12 (CYANOCOBALAMIN) 1000 MCG tablet Take 1,000 mcg by mouth daily.        Marland Kitchen DISCONTD: triamterene-hydrochlorothiazide (MAXZIDE-25) 37.5-25 MG per tablet Take 1 tablet by mouth daily.  30 tablet  11    Allergies  Allergen Reactions  . Amoxicillin  REACTION: diarrhea  . Codeine Phosphate     REACTION: nausea  . Metronidazole     REACTION: Rash, tongue ulcers  . Oxcarbazepine     REACTION: rash  . Perindopril Erbumine     REACTION: unspecified  . Prednisone     REACTION: swelling  . Tramadol Hcl     REACTION: diarrhea    Past Medical History  Diagnosis Date  . Allergy   . Anxiety   . Hypertension   . Osteopenia   . IBS (irritable bowel syndrome)   . Thyroid disease   . GERD (gastroesophageal reflux disease)   . Gastritis     Past Surgical History  Procedure Date  . Appendectomy 1948  . Abdominal hysterectomy 1968    left ovary remains  . Ovarian cyst surgery 1963    cyst on ovaries  . Cardiac catheterization 09/26/1998  . Hernia repair   . Colostomy w/ rectocele repair 06/2004  . Ventral hernia repair 08/2008    abdominal wall, lysis of adhesions    Family History  Problem Relation Age of Onset  . Heart disease Mother   . Stroke Brother     2  strokes  . Hypertension Neg Hx   . Diabetes Neg Hx   . Cancer Neg Hx     breast or colon cancer, no history    History   Social History  . Marital Status: Widowed    Spouse Name: N/A    Number of Children: 5  . Years of Education: N/A   Occupational History  . Not on file.   Social History Main Topics  . Smoking status: Former Games developer  . Smokeless tobacco: Never Used  . Alcohol Use: Yes     occasional  . Drug Use: Not on file  . Sexually Active: Not on file   Other Topics Concern  . Not on file   Social History Narrative   Daily caffeine use 6 cups every day   Review of Systems Sleeps well Appetite is fine Weight stable    Objective:   Physical Exam  Constitutional: She appears well-developed and well-nourished. No distress.  HENT:       No temporal bruits or sensitivity  Neck: Normal range of motion. Neck supple. No thyromegaly present.  Cardiovascular: Normal rate, regular rhythm, normal heart sounds and intact distal pulses.  Exam reveals no gallop.   No murmur heard. Pulmonary/Chest: Effort normal and breath sounds normal. No respiratory distress. She has no wheezes. She has no rales.  Abdominal: Soft. There is no tenderness.  Musculoskeletal: She exhibits no edema and no tenderness.  Lymphadenopathy:    She has no cervical adenopathy.  Psychiatric: She has a normal mood and affect. Her behavior is normal.       Mild chronic anxiety          Assessment & Plan:

## 2011-07-28 NOTE — Assessment & Plan Note (Addendum)
Seems to be worse Will increase buspirone to 15mg  tid

## 2011-07-28 NOTE — Assessment & Plan Note (Signed)
Recent eye exam Symptoms don't suggest temporal arteritis Observe only for now

## 2011-07-29 ENCOUNTER — Ambulatory Visit (INDEPENDENT_AMBULATORY_CARE_PROVIDER_SITE_OTHER): Payer: Medicare Other | Admitting: Obstetrics & Gynecology

## 2011-07-29 ENCOUNTER — Encounter: Payer: Self-pay | Admitting: Obstetrics & Gynecology

## 2011-07-29 VITALS — BP 116/74 | HR 79 | Ht 60.0 in | Wt 146.0 lb

## 2011-07-29 DIAGNOSIS — N952 Postmenopausal atrophic vaginitis: Secondary | ICD-10-CM

## 2011-07-29 DIAGNOSIS — R102 Pelvic and perineal pain: Secondary | ICD-10-CM

## 2011-07-29 DIAGNOSIS — N949 Unspecified condition associated with female genital organs and menstrual cycle: Secondary | ICD-10-CM

## 2011-07-29 MED ORDER — ESTRADIOL 0.1 MG/GM VA CREA: TOPICAL_CREAM | VAGINAL | Status: DC

## 2011-07-29 MED ORDER — FESOTERODINE FUMARATE ER 4 MG PO TB24: 4.0000 mg | ORAL_TABLET | Freq: Every day | ORAL | Status: DC

## 2011-07-29 NOTE — Progress Notes (Signed)
  Subjective:    Patient ID: Cheyenne Pierce, female    DOB: 07/18/1934, 77 y.o.   MRN: [id number]  HPI  Cheyenne Pierce is a 77 yo WW lady who comes with 2 complaints. The first is that of a year's h/o dysparunia. She and her partner have been unable to have sex for the last year due to this discomfort. Her other complaint is that of "bladder" pain for the last decade or so. She also mentions urinary frequency/urgency, but denies incontinence. She is not diabetic. She saw Dr. Mia Creek for this issue in the past and he sent her for a [facility] referral. At this time, those records are unavailable. She had some gyn mesh placed about 2004. She also had a mesh used for a left hernia repair.  Review of Systems Mammogram normal last month     Objective:   Physical Exam  Moderate atrophy, no visible mesh or scarring,  No pelvic masses, some mild tenderness with palpation over hernia repair site      Assessment & Plan:  1) dysparunia- probably due to atrophic vaginitis. I will prescribe 3 times per week Estrace cream  2) pelvic pain- check UA, UC&S. I will get records of her surgeries and previous work up.   3) toviaz per patient request  RTC 6 weeks

## 2011-07-29 NOTE — Progress Notes (Signed)
Addended by: Barbara Cower on: 07/29/2011 02:08 PM   Modules accepted: Orders

## 2011-07-30 LAB — URINALYSIS
Bilirubin Urine: NEGATIVE
Glucose, UA: NEGATIVE mg/dL
Ketones, ur: NEGATIVE mg/dL
Specific Gravity, Urine: 1.006 (ref 1.005–1.030)
pH: 7.5 (ref 5.0–8.0)

## 2011-07-31 ENCOUNTER — Encounter: Payer: Self-pay | Admitting: *Deleted

## 2011-08-07 ENCOUNTER — Other Ambulatory Visit: Payer: Self-pay | Admitting: Internal Medicine

## 2011-09-08 ENCOUNTER — Encounter: Payer: Self-pay | Admitting: Obstetrics & Gynecology

## 2011-09-08 ENCOUNTER — Ambulatory Visit (INDEPENDENT_AMBULATORY_CARE_PROVIDER_SITE_OTHER): Payer: Medicare Other | Admitting: Obstetrics & Gynecology

## 2011-09-08 VITALS — BP 127/79 | HR 84 | Ht 60.0 in | Wt 147.0 lb

## 2011-09-08 DIAGNOSIS — N952 Postmenopausal atrophic vaginitis: Secondary | ICD-10-CM

## 2011-09-08 NOTE — Progress Notes (Signed)
  Subjective:    Patient ID: Cheyenne Pierce, female    DOB: [birth year], 77 y.o.   MRN: [id number]  HPI  [name] was seen here in April for the complaints of dysparunia due to atrophy and bladder frequency/urgency without incontinence. I prescribed vaginal estrogen. She is here for follow up. She did not get the vaginal estrogen filled due to the $90 price tag.  Review of Systems     Objective:   Physical Exam        Assessment & Plan:  I have done further investigating and a local compounding pharmacy can give her 1 gram per vagina 3 times per week of estradiol for about $12 per month. She is willing to try this. I have told her that if this does not work for her bladder symptoms, then she will need to go to a urologist.

## 2011-09-09 ENCOUNTER — Other Ambulatory Visit: Payer: Self-pay | Admitting: Internal Medicine

## 2011-09-17 ENCOUNTER — Other Ambulatory Visit: Payer: Self-pay | Admitting: Internal Medicine

## 2011-09-25 ENCOUNTER — Ambulatory Visit: Payer: Self-pay | Admitting: Pain Medicine

## 2011-10-08 ENCOUNTER — Other Ambulatory Visit: Payer: Self-pay | Admitting: Internal Medicine

## 2011-10-20 ENCOUNTER — Ambulatory Visit: Payer: Self-pay | Admitting: Pain Medicine

## 2011-10-28 ENCOUNTER — Emergency Department: Payer: Self-pay | Admitting: Emergency Medicine

## 2011-10-28 LAB — BASIC METABOLIC PANEL
Calcium, Total: 9.4 mg/dL (ref 8.5–10.1)
Chloride: 102 mmol/L (ref 98–107)
Co2: 30 mmol/L (ref 21–32)
EGFR (African American): >60
EGFR (Non-African Amer.): >60
Glucose: 102 mg/dL — ABNORMAL HIGH (ref 65–99)
Osmolality: 276 (ref 275–301)
Potassium: 3.3 mmol/L — ABNORMAL LOW (ref 3.5–5.1)
Sodium: 138 mmol/L (ref 136–145)

## 2011-10-28 LAB — CBC
HGB: 13.3 g/dL (ref 12.0–16.0)
MCH: 30.1 pg (ref 26.0–34.0)
MCHC: 33 g/dL (ref 32.0–36.0)
Platelet: 343 10*3/uL (ref 150–440)
RBC: 4.42 10*6/uL (ref 3.80–5.20)
RDW: 14.3 % (ref 11.5–14.5)

## 2011-10-28 LAB — TROPONIN I: Troponin-I: <0.02 ng/mL

## 2011-10-28 LAB — CK TOTAL AND CKMB (NOT AT ARMC)
CK, Total: 123 U/L (ref 21–215)
CK-MB: 2.3 ng/mL (ref 0.5–3.6)

## 2011-10-30 ENCOUNTER — Ambulatory Visit: Payer: Self-pay | Admitting: Pain Medicine

## 2011-11-11 ENCOUNTER — Ambulatory Visit: Payer: Medicare Other | Admitting: Cardiovascular Disease

## 2011-11-11 ENCOUNTER — Ambulatory Visit (INDEPENDENT_AMBULATORY_CARE_PROVIDER_SITE_OTHER): Payer: Medicare Other | Admitting: Cardiovascular Disease

## 2011-11-11 ENCOUNTER — Encounter: Payer: Self-pay | Admitting: Cardiovascular Disease

## 2011-11-11 VITALS — BP 130/82 | HR 76 | Ht 60.0 in | Wt 141.8 lb

## 2011-11-11 DIAGNOSIS — K219 Gastro-esophageal reflux disease without esophagitis: Secondary | ICD-10-CM

## 2011-11-11 DIAGNOSIS — R002 Palpitations: Secondary | ICD-10-CM

## 2011-11-11 DIAGNOSIS — R5383 Other fatigue: Secondary | ICD-10-CM

## 2011-11-11 DIAGNOSIS — R079 Chest pain, unspecified: Secondary | ICD-10-CM

## 2011-11-11 DIAGNOSIS — R5381 Other malaise: Secondary | ICD-10-CM

## 2011-11-11 DIAGNOSIS — R011 Cardiac murmur, unspecified: Secondary | ICD-10-CM

## 2011-11-11 DIAGNOSIS — I10 Essential (primary) hypertension: Secondary | ICD-10-CM

## 2011-11-11 NOTE — Patient Instructions (Addendum)
Your physician has requested that you have an echocardiogram. Echocardiography is a painless test that uses sound waves to create images of your heart. It provides your doctor with information about the size and shape of your heart and how well your heart's chambers and valves are working. This procedure takes approximately one hour. There are no restrictions for this procedure.  Your physician has requested that you have a lexiscan myoview. For further information please visit [email or web address]. Please follow instruction sheet, as given.  Follow up after tests.  

## 2011-11-15 ENCOUNTER — Encounter: Payer: Self-pay | Admitting: Cardiovascular Disease

## 2011-11-15 DIAGNOSIS — R011 Cardiac murmur, unspecified: Secondary | ICD-10-CM | POA: Insufficient documentation

## 2011-11-15 DIAGNOSIS — R079 Chest pain, unspecified: Secondary | ICD-10-CM | POA: Insufficient documentation

## 2011-11-15 NOTE — Progress Notes (Signed)
HPI  This is a 77 year old female who is here today for evaluation of chest pain and dizziness. The patient had recurrent chest pain throughout her life. Most recent evaluation by cardiology was in 2009 by Dr. Tenny Craw. She underwent an echocardiogram and adenosine Myoview at that time. Both of them were unremarkable. The patient suffers from severe GERD. She had 3 recent episodes of chest pain described as a tingling sensation in the substernal area without radiation. All happened at rest and not with activities. She has chronic dizziness and possible vertigo which has worsened recently. She does complain of frequent palpitations. She also suffers from anxiety. She went to the emergency room recently at [facility] for these complaints. Her basic evaluation was unremarkable. Cardiac enzymes were negative. Chest x-ray showed no acute abnormalities.  Allergies  Allergen Reactions  . Amoxicillin     REACTION: diarrhea  . Codeine Phosphate     REACTION: nausea  . Metronidazole     REACTION: Rash, tongue ulcers  . Oxcarbazepine     REACTION: rash  . Perindopril Erbumine     REACTION: unspecified  . Prednisone     REACTION: swelling  . Tramadol Hcl     REACTION: diarrhea     Current Outpatient Prescriptions on File Prior to Visit  Medication Sig Dispense Refill  . amLODipine (NORVASC) 5 MG tablet take 1 tablet by mouth once daily  30 tablet  2  . busPIRone (BUSPAR) 15 MG tablet TAKE 1 TABLET BY MOUTH 3 TIMES A DAY  90 tablet  11  . diclofenac (FLECTOR) 1.3 % PTCH Place 1 patch onto the skin as needed.        . diphenhydrAMINE (BENADRYL) 25 mg capsule Take 25 mg by mouth at bedtime as needed.      Marland Kitchen estradiol (ESTRACE) 0.5 MG tablet take 1 tablet by mouth once daily  30 tablet  12  . fluticasone (FLONASE) 50 MCG/ACT nasal spray Place 2 sprays into the nose daily.  16 g  3  . gabapentin (NEURONTIN) 300 MG capsule take 2 capsules by mouth twice a day  120 capsule  12  . hyoscyamine (LEVSIN,  ANASPAZ) 0.125 MG tablet Take 0.125 mg by mouth as needed.        Marland Kitchen levothyroxine (SYNTHROID, LEVOTHROID) 75 MCG tablet Take 1 tablet (75 mcg total) by mouth daily.  90 tablet  3  . Loperamide-Simethicone (IMODIUM ADVANCED) 2-125 MG TABS Take by mouth.        . meclizine (ANTIVERT) 25 MG tablet Take 25 mg by mouth 3 (three) times daily as needed.        . Multiple Vitamins-Minerals (CENTRUM SILVER PO) Take by mouth.        . pantoprazole (PROTONIX) 40 MG tablet take 1 tablet by mouth twice a day  60 tablet  10  . Probiotic Product (ALIGN) 4 MG CAPS Take by mouth.        . ranitidine (ZANTAC) 150 MG capsule Take 150 mg by mouth 2 (two) times daily.        . Tragacanth (ASTRAGALUS ROOT) POWD Take by mouth daily.        Marland Kitchen triamterene-hydrochlorothiazide (MAXZIDE-25) 37.5-25 MG per tablet take 1 tablet by mouth once daily  30 tablet  11  . vitamin B-12 (CYANOCOBALAMIN) 1000 MCG tablet Take 1,000 mcg by mouth daily.           Past Medical History  Diagnosis Date  . Allergy   . Anxiety   .  Hypertension   . Osteopenia   . IBS (irritable bowel syndrome)   . Thyroid disease   . GERD (gastroesophageal reflux disease)   . Gastritis      Past Surgical History  Procedure Date  . Appendectomy 1948  . Abdominal hysterectomy 1968    left ovary remains  . Ovarian cyst surgery 1963    cyst on ovaries  . Cardiac catheterization 09/26/1998  . Hernia repair   . Colostomy w/ rectocele repair 06/2004  . Ventral hernia repair 08/2008    abdominal wall, lysis of adhesions     Family History  Problem Relation Age of Onset  . Heart disease Mother   . Stroke Brother     2 strokes  . Hypertension Neg Hx   . Diabetes Neg Hx   . Cancer Neg Hx     breast or colon cancer, no history     History   Social History  . Marital Status: Widowed    Spouse Name: N/A    Number of Children: 5  . Years of Education: N/A   Occupational History  . Not on file.   Social History Main Topics  .  Smoking status: Former Smoker -- 1.0 packs/day for 30 years    Quit date: 05/19/1978  . Smokeless tobacco: Never Used  . Alcohol Use: No  . Drug Use: No  . Sexually Active: Not on file   Other Topics Concern  . Not on file   Social History Narrative   Daily caffeine use 6 cups every day     ROS Constitutional: Negative for fever, chills, diaphoresis, activity change, appetite change and fatigue.  HENT: Negative for hearing loss, nosebleeds, congestion, sore throat, facial swelling, drooling, trouble swallowing, neck pain, voice change, sinus pressure and tinnitus.  Eyes: Negative for photophobia, pain, discharge and visual disturbance.  Respiratory: Negative for apnea, cough and wheezing.  Cardiovascular: Negative for leg swelling.  Gastrointestinal: Negative for nausea, vomiting, abdominal pain, diarrhea, constipation, blood in stool and abdominal distention.  Genitourinary: Negative for dysuria, urgency, frequency, hematuria and decreased urine volume.  Musculoskeletal: Negative for myalgias, back pain, joint swelling, arthralgias and gait problem.  Skin: Negative for color change, pallor, rash and wound.  Neurological: Negative for dizziness, tremors, seizures, syncope, speech difficulty, weakness, light-headedness, numbness and headaches.  Psychiatric/Behavioral: Negative for suicidal ideas, hallucinations, behavioral problems and agitation. The patient is nervous/anxious.     PHYSICAL EXAM   BP 130/82  Pulse 76  Ht 5' (1.524 m)  Wt 141 lb 12 oz (64.297 kg)  BMI 27.68 kg/m2 Constitutional: She is oriented to person, place, and time. She appears well-developed and well-nourished. No distress.  HENT: No nasal discharge.  Head: Normocephalic and atraumatic.  Eyes: Pupils are equal and round. Right eye exhibits no discharge. Left eye exhibits no discharge.  Neck: Normal range of motion. Neck supple. No JVD present. No thyromegaly present.  Cardiovascular: Normal rate,  regular rhythm, normal heart sounds. Exam reveals no gallop and no friction rub. There is a 2/6 systolic ejection murmur at the pulmonic area. Pulmonary/Chest: Effort normal and breath sounds normal. No stridor. No respiratory distress. She has no wheezes. She has no rales. She exhibits no tenderness.  Abdominal: Soft. Bowel sounds are normal. She exhibits no distension. There is no tenderness. There is no rebound and no guarding.  Musculoskeletal: Normal range of motion. She exhibits no edema and no tenderness.  Neurological: She is alert and oriented to person, place, and time. Coordination normal.  Skin: Skin is warm and dry. No rash noted. She is not diaphoretic. No erythema. No pallor.  Psychiatric: She has a normal mood and affect. Her behavior is normal. Judgment and thought content normal.     EKG: Sinus  Rhythm  WITHIN NORMAL LIMITS   ASSESSMENT AND PLAN

## 2011-11-15 NOTE — Assessment & Plan Note (Signed)
The patient has a cardiac systolic murmur mostly healed and the pulmonic valve area. I will request an echocardiogram for evaluation.

## 2011-11-15 NOTE — Assessment & Plan Note (Signed)
Her chest pain is overall atypical. Given her known history of GERD, there is a possibility of esophageal spasm. Anxiety might also be contributing. However, she has multiple risk factors for coronary artery disease and thus I recommend further evaluation with a pharmacologic nuclear stress test. The patient is not able to exercise on a treadmill. She was noted on physical exam to have a few premature beats. She might benefit from a small dose beta blocker after her cardiac evaluation.

## 2011-11-21 ENCOUNTER — Ambulatory Visit: Payer: Self-pay | Admitting: Cardiovascular Disease

## 2011-11-21 DIAGNOSIS — R079 Chest pain, unspecified: Secondary | ICD-10-CM

## 2011-11-24 ENCOUNTER — Ambulatory Visit: Payer: Self-pay | Admitting: Pain Medicine

## 2011-11-24 ENCOUNTER — Other Ambulatory Visit (INDEPENDENT_AMBULATORY_CARE_PROVIDER_SITE_OTHER): Payer: Medicare Other

## 2011-11-24 ENCOUNTER — Other Ambulatory Visit: Payer: Self-pay

## 2011-11-24 DIAGNOSIS — R072 Precordial pain: Secondary | ICD-10-CM

## 2011-11-24 DIAGNOSIS — R011 Cardiac murmur, unspecified: Secondary | ICD-10-CM

## 2011-11-24 DIAGNOSIS — R079 Chest pain, unspecified: Secondary | ICD-10-CM

## 2011-11-25 LAB — HM DEXA SCAN

## 2011-11-28 ENCOUNTER — Ambulatory Visit (INDEPENDENT_AMBULATORY_CARE_PROVIDER_SITE_OTHER): Payer: Medicare Other | Admitting: Cardiovascular Disease

## 2011-11-28 ENCOUNTER — Encounter: Payer: Self-pay | Admitting: Cardiovascular Disease

## 2011-11-28 VITALS — BP 130/70 | HR 88 | Ht 59.0 in | Wt 144.0 lb

## 2011-11-28 DIAGNOSIS — R079 Chest pain, unspecified: Secondary | ICD-10-CM

## 2011-11-28 DIAGNOSIS — R011 Cardiac murmur, unspecified: Secondary | ICD-10-CM

## 2011-11-28 MED ORDER — METOPROLOL TARTRATE 25 MG PO TABS: 25.0000 mg | ORAL_TABLET | Freq: Two times a day (BID) | ORAL | Status: DC

## 2011-11-28 NOTE — Patient Instructions (Addendum)
Start Metoprolol 25 mg twice daily.  Follow up in 1 month.

## 2011-11-30 NOTE — Assessment & Plan Note (Signed)
She underwent cardiac evaluation which included a pharmacologic nuclear stress test. This showed no evidence of ischemia or infarcts with normal ejection fraction. It is possible that some of her symptoms might be related to anxiety. However, the exertional component is somewhat worrisome. I will start metoprolol 25 mg twice daily and have her followup with me in one month. If she continues to have frequent episodes of chest pain, then I plan on proceeding with cardiac catheterization for a definitive diagnosis.

## 2011-11-30 NOTE — Assessment & Plan Note (Signed)
There was only mild mitral and aortic insufficiency. No other significant valvular abnormalities.     

## 2011-11-30 NOTE — Progress Notes (Signed)
HPI  This is a 77 year old female who is here today for a followup visit.  The patient had recurrent chest pain throughout her life. She had cardiac evaluation in 2009 by Dr. Tenny Craw. She underwent an echocardiogram and adenosine Myoview at that time. Both of them were unremarkable. The patient suffers from severe GERD. Her symptoms in the past improved after the addition of amlodipine. However, continues to have recurrent episodes of chest pain. She had one episode 2 days ago which happened after she picked a heavy object. Chest pain resolved with rest. Most of the time she feels palpitations followed by chest pain.  She also suffers from anxiety.   Allergies  Allergen Reactions  . Amoxicillin     REACTION: diarrhea  . Codeine Phosphate     REACTION: nausea  . Metronidazole     REACTION: Rash, tongue ulcers  . Oxcarbazepine     REACTION: rash  . Perindopril Erbumine     REACTION: unspecified  . Prednisone     REACTION: swelling  . Tramadol Hcl     REACTION: diarrhea     Current Outpatient Prescriptions on File Prior to Visit  Medication Sig Dispense Refill  . amLODipine (NORVASC) 5 MG tablet take 1 tablet by mouth once daily  30 tablet  2  . busPIRone (BUSPAR) 15 MG tablet TAKE 1 TABLET BY MOUTH 3 TIMES A DAY  90 tablet  11  . diclofenac (FLECTOR) 1.3 % PTCH Place 1 patch onto the skin as needed.        . diphenhydrAMINE (BENADRYL) 25 mg capsule Take 25 mg by mouth at bedtime as needed.      Marland Kitchen estradiol (ESTRACE) 0.5 MG tablet take 1 tablet by mouth once daily  30 tablet  12  . fluticasone (FLONASE) 50 MCG/ACT nasal spray Place 2 sprays into the nose daily.  16 g  3  . gabapentin (NEURONTIN) 300 MG capsule take 2 capsules by mouth twice a day  120 capsule  12  . hyoscyamine (LEVSIN, ANASPAZ) 0.125 MG tablet Take 0.125 mg by mouth as needed.        Marland Kitchen levothyroxine (SYNTHROID, LEVOTHROID) 75 MCG tablet Take 1 tablet (75 mcg total) by mouth daily.  90 tablet  3  .  Loperamide-Simethicone (IMODIUM ADVANCED) 2-125 MG TABS Take by mouth.        . meclizine (ANTIVERT) 25 MG tablet Take 25 mg by mouth 3 (three) times daily as needed.        . Multiple Vitamins-Minerals (CENTRUM SILVER PO) Take by mouth.        . pantoprazole (PROTONIX) 40 MG tablet take 1 tablet by mouth twice a day  60 tablet  10  . Probiotic Product (ALIGN) 4 MG CAPS Take by mouth.        . ranitidine (ZANTAC) 150 MG capsule Take 150 mg by mouth 2 (two) times daily.        . Tragacanth (ASTRAGALUS ROOT) POWD Take by mouth daily.        Marland Kitchen triamterene-hydrochlorothiazide (MAXZIDE-25) 37.5-25 MG per tablet take 1 tablet by mouth once daily  30 tablet  11  . vitamin B-12 (CYANOCOBALAMIN) 1000 MCG tablet Take 1,000 mcg by mouth daily.        . metoprolol tartrate (LOPRESSOR) 25 MG tablet Take 1 tablet (25 mg total) by mouth 2 (two) times daily.  60 tablet  6     Past Medical History  Diagnosis Date  . Allergy   .  Anxiety   . Hypertension   . Osteopenia   . IBS (irritable bowel syndrome)   . Thyroid disease   . GERD (gastroesophageal reflux disease)   . Gastritis      Past Surgical History  Procedure Date  . Appendectomy 1948  . Abdominal hysterectomy 1968    left ovary remains  . Ovarian cyst surgery 1963    cyst on ovaries  . Cardiac catheterization 09/26/1998  . Hernia repair   . Colostomy w/ rectocele repair 06/2004  . Ventral hernia repair 08/2008    abdominal wall, lysis of adhesions     Family History  Problem Relation Age of Onset  . Heart disease Mother   . Stroke Brother     2 strokes  . Hypertension Neg Hx   . Diabetes Neg Hx   . Cancer Neg Hx     breast or colon cancer, no history     History   Social History  . Marital Status: Widowed    Spouse Name: N/A    Number of Children: 5  . Years of Education: N/A   Occupational History  . Not on file.   Social History Main Topics  . Smoking status: Former Smoker -- 1.0 packs/day for 30 years    Quit  date: 05/19/1978  . Smokeless tobacco: Never Used  . Alcohol Use: No  . Drug Use: No  . Sexually Active: Not on file   Other Topics Concern  . Not on file   Social History Narrative   Daily caffeine use 6 cups every day     PHYSICAL EXAM   BP 130/70  Pulse 88  Ht 4\' 11"  (1.499 m)  Wt 144 lb (65.318 kg)  BMI 29.08 kg/m2 Constitutional: She is oriented to person, place, and time. She appears well-developed and well-nourished. No distress.  HENT: No nasal discharge.  Head: Normocephalic and atraumatic.  Eyes: Pupils are equal and round. Right eye exhibits no discharge. Left eye exhibits no discharge.  Neck: Normal range of motion. Neck supple. No JVD present. No thyromegaly present.  Cardiovascular: Normal rate, regular rhythm, normal heart sounds. Exam reveals no gallop and no friction rub. There is one out of 6 systolic ejection murmur at the base of the heart. Pulmonary/Chest: Effort normal and breath sounds normal. No stridor. No respiratory distress. She has no wheezes. She has no rales. She exhibits no tenderness.  Abdominal: Soft. Bowel sounds are normal. She exhibits no distension. There is no tenderness. There is no rebound and no guarding.  Musculoskeletal: Normal range of motion. She exhibits no edema and no tenderness.  Neurological: She is alert and oriented to person, place, and time. Coordination normal.  Skin: Skin is warm and dry. No rash noted. She is not diaphoretic. No erythema. No pallor.  Psychiatric: She has a normal mood and affect. Her behavior is normal. Judgment and thought content normal.     ASSESSMENT AND PLAN

## 2011-12-04 ENCOUNTER — Ambulatory Visit: Payer: Self-pay | Admitting: Pain Medicine

## 2011-12-04 ENCOUNTER — Other Ambulatory Visit: Payer: Self-pay | Admitting: Cardiovascular Disease

## 2011-12-04 DIAGNOSIS — R079 Chest pain, unspecified: Secondary | ICD-10-CM

## 2011-12-05 ENCOUNTER — Other Ambulatory Visit: Payer: Self-pay | Admitting: Cardiovascular Disease

## 2011-12-05 MED ORDER — AMLODIPINE BESYLATE 5 MG PO TABS: 5.0000 mg | ORAL_TABLET | Freq: Every day | ORAL | Status: DC

## 2011-12-05 NOTE — Telephone Encounter (Signed)
Refilled Amlodipine.

## 2011-12-24 ENCOUNTER — Ambulatory Visit: Payer: Self-pay | Admitting: Pain Medicine

## 2011-12-29 ENCOUNTER — Ambulatory Visit: Payer: Medicare Other | Admitting: Cardiovascular Disease

## 2011-12-30 ENCOUNTER — Ambulatory Visit (INDEPENDENT_AMBULATORY_CARE_PROVIDER_SITE_OTHER): Payer: Medicare Other | Admitting: Cardiovascular Disease

## 2011-12-30 ENCOUNTER — Encounter: Payer: Self-pay | Admitting: Cardiovascular Disease

## 2011-12-30 VITALS — BP 120/70 | HR 64 | Ht 59.0 in | Wt 144.5 lb

## 2011-12-30 DIAGNOSIS — R42 Dizziness and giddiness: Secondary | ICD-10-CM

## 2011-12-30 DIAGNOSIS — R011 Cardiac murmur, unspecified: Secondary | ICD-10-CM

## 2011-12-30 DIAGNOSIS — R079 Chest pain, unspecified: Secondary | ICD-10-CM

## 2011-12-30 DIAGNOSIS — I10 Essential (primary) hypertension: Secondary | ICD-10-CM

## 2011-12-30 MED ORDER — NEBIVOLOL HCL 5 MG PO TABS: 5.0000 mg | ORAL_TABLET | Freq: Every day | ORAL | Status: DC

## 2011-12-30 NOTE — Patient Instructions (Addendum)
Stop Metoprolol.  Start Bystolic 5 mg once daily in the morning. Call us in 1-2 weeks to send you a prescription if this works better.   Follow up in 3 months.

## 2012-01-01 ENCOUNTER — Ambulatory Visit (INDEPENDENT_AMBULATORY_CARE_PROVIDER_SITE_OTHER): Payer: Medicare Other | Admitting: Family Medicine

## 2012-01-01 ENCOUNTER — Telehealth: Payer: Self-pay | Admitting: Internal Medicine

## 2012-01-01 ENCOUNTER — Encounter: Payer: Self-pay | Admitting: Family Medicine

## 2012-01-01 VITALS — BP 130/80 | HR 76 | Temp 98.1°F | Wt 143.5 lb

## 2012-01-01 DIAGNOSIS — Z23 Encounter for immunization: Secondary | ICD-10-CM

## 2012-01-01 DIAGNOSIS — F411 Generalized anxiety disorder: Secondary | ICD-10-CM

## 2012-01-01 DIAGNOSIS — E039 Hypothyroidism, unspecified: Secondary | ICD-10-CM

## 2012-01-01 LAB — TSH: TSH: 0.76 u[IU]/mL (ref 0.35–5.50)

## 2012-01-01 MED ORDER — BUSPIRONE HCL 15 MG PO TABS: 15.0000 mg | ORAL_TABLET | Freq: Two times a day (BID) | ORAL | Status: DC

## 2012-01-01 MED ORDER — SERTRALINE HCL 50 MG PO TABS: 50.0000 mg | ORAL_TABLET | Freq: Every day | ORAL | Status: DC

## 2012-01-01 NOTE — Telephone Encounter (Signed)
Patient was seen by Dr. Sharen Hones today and at checkout she informed me to cancel her physical that she has with Dr. Alphonsus Sias and she wanted to change her PCP to Dr. Sharen Hones.  Patient was asked why and she didn't have a reason except she just wanted to change and she would hate to have to change clinics.  Please let me know what to tell the patient.  Thanks,  [employer]

## 2012-01-01 NOTE — Patient Instructions (Addendum)
Lets check thyroid today. Decrease buspar to 15mg  twice daily. Start zoloft at 1/2 pill daily for 1 week then increase to 50mg  daily.  Don't suddenly stop medicine.  This will take about 3-4 weeks to take effect. Return to see me in 1 month for follow up and medicare wellness visit. Ask up front about the change in providers. Flu shot today.

## 2012-01-01 NOTE — Telephone Encounter (Signed)
Patient was seen in the office today by Dr. Sharen Hones and at checkout wanted to cancel her physical with Dr. Alphonsus Sias and stated she wants to change her PCP to Dr. Sharen Hones.  She had no reason for wanting to change but stated she would hate to have to leave the practice because she can't change PCP's.  I informed the patient that I would have to notify both physicians of her request and that I would be in contact with her to reschedule her appointment if that's what she would like to do depending on the doctors' decision.  Please let me know what to tell the patient.  Thanks,  [employer]

## 2012-01-01 NOTE — Telephone Encounter (Signed)
I saw pt today.  Ok by me if ok by Dr. Elbert Ewings.

## 2012-01-01 NOTE — Progress Notes (Signed)
Subjective:    Patient ID: Cheyenne Pierce, female    DOB: March 20, 1935, 77 y.o.   MRN: [id number]  HPI CC: anxiety issues  Pt of Dr. Karle Starch prsents with increased anxiety issues.  Also would like thyroid checked.  Stays cold.  Also feeling more tired, sluggish, down. Lab Results  Component Value Date   TSH 0.65 07/28/2011  compliant with levothyroxine daily.  Has family problems - one son with mouth cancer, another son with colon cancer.  This happened last week.  Trouble dealing with this.  Prior on Amitriptyline - didn't tolerate.  Has never been on SSRIs.  Trouble sleeping at night.  On increased buspar since April.  Saw Dr. Kirke Corin on Tuesday, suggested she f/u with PCP regarding anxiety.  Would like switch in PCPs from Dr. Elbert Ewings to myself.  Current Outpatient Prescriptions on File Prior to Visit  Medication Sig Dispense Refill  . busPIRone (BUSPAR) 15 MG tablet TAKE 1 TABLET BY MOUTH 3 TIMES A DAY  90 tablet  11  . diclofenac (FLECTOR) 1.3 % PTCH Place 1 patch onto the skin as needed.        . diphenhydrAMINE (BENADRYL) 25 mg capsule Take 25 mg by mouth at bedtime as needed.      Marland Kitchen estradiol (ESTRACE) 0.5 MG tablet take 1 tablet by mouth once daily  30 tablet  12  . fluticasone (FLONASE) 50 MCG/ACT nasal spray Place 2 sprays into the nose daily.  16 g  3  . gabapentin (NEURONTIN) 300 MG capsule Take 300 mg by mouth 3 (three) times daily.       . hyoscyamine (LEVSIN, ANASPAZ) 0.125 MG tablet Take 0.125 mg by mouth as needed.        Marland Kitchen levothyroxine (SYNTHROID, LEVOTHROID) 75 MCG tablet Take 1 tablet (75 mcg total) by mouth daily.  90 tablet  3  . Loperamide-Simethicone (IMODIUM ADVANCED) 2-125 MG TABS Take by mouth.        . meclizine (ANTIVERT) 25 MG tablet Take 25 mg by mouth 3 (three) times daily as needed.        . Multiple Vitamins-Minerals (CENTRUM SILVER PO) Take by mouth.        . nebivolol (BYSTOLIC) 5 MG tablet Take 1 tablet (5 mg total) by mouth daily.  30 tablet    .  nitroGLYCERIN (NITROSTAT) 0.4 MG SL tablet Place 0.4 mg under the tongue every 5 (five) minutes as needed.      . pantoprazole (PROTONIX) 40 MG tablet take 1 tablet by mouth twice a day  60 tablet  10  . Probiotic Product (ALIGN) 4 MG CAPS Take by mouth.        . ranitidine (ZANTAC) 150 MG capsule Take 150 mg by mouth 2 (two) times daily.        . Tragacanth (ASTRAGALUS ROOT) POWD Take by mouth daily.        Marland Kitchen triamterene-hydrochlorothiazide (MAXZIDE-25) 37.5-25 MG per tablet take 1 tablet by mouth once daily  30 tablet  11  . vitamin B-12 (CYANOCOBALAMIN) 1000 MCG tablet Take 1,000 mcg by mouth daily.        Marland Kitchen DISCONTD: amLODipine (NORVASC) 5 MG tablet Take 1 tablet (5 mg total) by mouth daily.  30 tablet  5     Review of Systems Per HPI    Objective:   Physical Exam  Nursing note and vitals reviewed. Constitutional: She appears well-developed and well-nourished. No distress.  Psychiatric:  anxious       Assessment & Plan:

## 2012-01-01 NOTE — Telephone Encounter (Signed)
See other phone note

## 2012-01-01 NOTE — Telephone Encounter (Signed)
Okay with me 

## 2012-01-02 NOTE — Telephone Encounter (Signed)
I notified patient that she can switch to Dr.Gutierrez.

## 2012-01-02 NOTE — Assessment & Plan Note (Signed)
Check TSH per pt request given new sxs.

## 2012-01-02 NOTE — Assessment & Plan Note (Signed)
Recently worsened, recent significant stressors. Endorsing dizziness.  Recommended decrease in buspar to 15mg  bid, start trial of SSRI - discussed this med will take 3-4 wks to take effect, asked her to return in 1 mo for f/u.

## 2012-01-04 ENCOUNTER — Other Ambulatory Visit: Payer: Self-pay | Admitting: Family Medicine

## 2012-01-04 DIAGNOSIS — E039 Hypothyroidism, unspecified: Secondary | ICD-10-CM

## 2012-01-04 DIAGNOSIS — I10 Essential (primary) hypertension: Secondary | ICD-10-CM

## 2012-01-04 DIAGNOSIS — M899 Disorder of bone, unspecified: Secondary | ICD-10-CM

## 2012-01-05 ENCOUNTER — Other Ambulatory Visit (INDEPENDENT_AMBULATORY_CARE_PROVIDER_SITE_OTHER): Payer: Medicare Other

## 2012-01-05 DIAGNOSIS — I10 Essential (primary) hypertension: Secondary | ICD-10-CM

## 2012-01-05 DIAGNOSIS — M899 Disorder of bone, unspecified: Secondary | ICD-10-CM

## 2012-01-05 LAB — COMPREHENSIVE METABOLIC PANEL
ALT: 38 U/L — ABNORMAL HIGH (ref 0–35)
AST: 29 U/L (ref 0–37)
Albumin: 3.8 g/dL (ref 3.5–5.2)
Calcium: 9.3 mg/dL (ref 8.4–10.5)
Chloride: 93 mEq/L — ABNORMAL LOW (ref 96–112)
Potassium: 3.8 mEq/L (ref 3.5–5.1)
Total Protein: 7 g/dL (ref 6.0–8.3)

## 2012-01-05 LAB — LIPID PANEL
Cholesterol: 224 mg/dL — ABNORMAL HIGH (ref 0–200)
HDL: 54.7 mg/dL (ref >39.00)
Triglycerides: 142 mg/dL (ref 0.0–149.0)

## 2012-01-06 LAB — VITAMIN D 25 HYDROXY (VIT D DEFICIENCY, FRACTURES): Vit D, 25-Hydroxy: 38 ng/mL (ref 30–89)

## 2012-01-06 NOTE — Progress Notes (Signed)
HPI  This is a 77 year old female who is here today for a followup visit.  The patient had recurrent chest pain throughout her life. She had cardiac evaluation in 2009 by Dr. Tenny Craw. She underwent an echocardiogram and adenosine Myoview at that time. Both of them were unremarkable. The patient suffers from severe GERD. Her symptoms in the past improved after the addition of amlodipine.  She underwent evaluation with a nuclear stress test which showed no evidence of ischemia with normal ejection fraction. She continued to have exertional chest pain and palpitations. Thus, metoprolol 25 mg twice daily was added. Since then, she reports no further episodes of chest pain. However, she reports recent nightmares and fatigue which she thinks might be due to metoprolol.  Allergies  Allergen Reactions  . Amoxicillin     REACTION: diarrhea  . Codeine Phosphate     REACTION: nausea  . Metronidazole     REACTION: Rash, tongue ulcers  . Oxcarbazepine     REACTION: rash  . Perindopril Erbumine     REACTION: unspecified  . Prednisone     REACTION: swelling  . Tramadol Hcl     REACTION: diarrhea     Current Outpatient Prescriptions on File Prior to Visit  Medication Sig Dispense Refill  . diclofenac (FLECTOR) 1.3 % PTCH Place 1 patch onto the skin as needed.        . diphenhydrAMINE (BENADRYL) 25 mg capsule Take 25 mg by mouth at bedtime as needed.      Marland Kitchen estradiol (ESTRACE) 0.5 MG tablet take 1 tablet by mouth once daily  30 tablet  12  . fluticasone (FLONASE) 50 MCG/ACT nasal spray Place 2 sprays into the nose daily.  16 g  3  . gabapentin (NEURONTIN) 300 MG capsule Take 300 mg by mouth 3 (three) times daily.       . hyoscyamine (LEVSIN, ANASPAZ) 0.125 MG tablet Take 0.125 mg by mouth as needed.        Marland Kitchen levothyroxine (SYNTHROID, LEVOTHROID) 75 MCG tablet Take 1 tablet (75 mcg total) by mouth daily.  90 tablet  3  . Loperamide-Simethicone (IMODIUM ADVANCED) 2-125 MG TABS Take by mouth.          . meclizine (ANTIVERT) 25 MG tablet Take 25 mg by mouth 3 (three) times daily as needed.        . Multiple Vitamins-Minerals (CENTRUM SILVER PO) Take by mouth.        . pantoprazole (PROTONIX) 40 MG tablet take 1 tablet by mouth twice a day  60 tablet  10  . Probiotic Product (ALIGN) 4 MG CAPS Take by mouth.        . ranitidine (ZANTAC) 150 MG capsule Take 150 mg by mouth 2 (two) times daily.        . Tragacanth (ASTRAGALUS ROOT) POWD Take by mouth daily.        Marland Kitchen triamterene-hydrochlorothiazide (MAXZIDE-25) 37.5-25 MG per tablet take 1 tablet by mouth once daily  30 tablet  11  . vitamin B-12 (CYANOCOBALAMIN) 1000 MCG tablet Take 1,000 mcg by mouth daily.        Marland Kitchen amLODipine (NORVASC) 5 MG tablet Take 10 mg by mouth daily.      . nebivolol (BYSTOLIC) 5 MG tablet Take 1 tablet (5 mg total) by mouth daily.  30 tablet    . nitroGLYCERIN (NITROSTAT) 0.4 MG SL tablet Place 0.4 mg under the tongue every 5 (five) minutes as needed.      Marland Kitchen  sertraline (ZOLOFT) 50 MG tablet Take 1 tablet (50 mg total) by mouth daily.  30 tablet  6     Past Medical History  Diagnosis Date  . Allergy   . Anxiety   . Hypertension   . Osteopenia   . IBS (irritable bowel syndrome)   . Thyroid disease   . GERD (gastroesophageal reflux disease)   . Gastritis      Past Surgical History  Procedure Date  . Appendectomy 1948  . Abdominal hysterectomy 1968    left ovary remains  . Ovarian cyst surgery 1963    cyst on ovaries  . Cardiac catheterization 09/26/1998  . Hernia repair   . Colostomy w/ rectocele repair 06/2004  . Ventral hernia repair 08/2008    abdominal wall, lysis of adhesions     Family History  Problem Relation Age of Onset  . Heart disease Mother   . Stroke Brother     2 strokes  . Hypertension Neg Hx   . Diabetes Neg Hx   . Cancer Neg Hx     breast or colon cancer, no history     History   Social History  . Marital Status: Widowed    Spouse Name: N/A    Number of Children: 5   . Years of Education: N/A   Occupational History  . Not on file.   Social History Main Topics  . Smoking status: Former Smoker -- 1.0 packs/day for 30 years    Quit date: 05/19/1978  . Smokeless tobacco: Never Used  . Alcohol Use: No  . Drug Use: No  . Sexually Active: Not on file   Other Topics Concern  . Not on file   Social History Narrative   Daily caffeine use 6 cups every day     PHYSICAL EXAM   BP 120/70  Pulse 64  Ht 4\' 11"  (1.499 m)  Wt 65.545 kg (144 lb 8 oz)  BMI 29.19 kg/m2 Constitutional: She is oriented to person, place, and time. She appears well-developed and well-nourished. No distress.  HENT: No nasal discharge.  Head: Normocephalic and atraumatic.  Eyes: Pupils are equal and round. Right eye exhibits no discharge. Left eye exhibits no discharge.  Neck: Normal range of motion. Neck supple. No JVD present. No thyromegaly present.  Cardiovascular: Normal rate, regular rhythm, normal heart sounds. Exam reveals no gallop and no friction rub. There is one out of 6 systolic ejection murmur at the base of the heart. Pulmonary/Chest: Effort normal and breath sounds normal. No stridor. No respiratory distress. She has no wheezes. She has no rales. She exhibits no tenderness.  Abdominal: Soft. Bowel sounds are normal. She exhibits no distension. There is no tenderness. There is no rebound and no guarding.  Musculoskeletal: Normal range of motion. She exhibits no edema and no tenderness.  Neurological: She is alert and oriented to person, place, and time. Coordination normal.  Skin: Skin is warm and dry. No rash noted. She is not diaphoretic. No erythema. No pallor.  Psychiatric: She has a normal mood and affect. Her behavior is normal. Judgment and thought content normal.     ASSESSMENT AND PLAN

## 2012-01-06 NOTE — Assessment & Plan Note (Signed)
She underwent cardiac evaluation which included a pharmacologic nuclear stress test. This showed no evidence of ischemia or infarcts with normal ejection fraction. It is possible that some of her symptoms might be related to anxiety.  Her symptoms improved significantly after the addition of metoprolol 25 mg twice daily. However, she now reports fatigue and recent nightmares which might be related to his.  Thus, I will switch her from metoprolol to Bystolic 5 mg once daily.

## 2012-01-06 NOTE — Assessment & Plan Note (Signed)
There was only mild mitral and aortic insufficiency. No other significant valvular abnormalities.     

## 2012-01-07 ENCOUNTER — Telehealth: Payer: Self-pay | Admitting: Family Medicine

## 2012-01-07 ENCOUNTER — Telehealth: Payer: Self-pay | Admitting: Cardiovascular Disease

## 2012-01-07 NOTE — Telephone Encounter (Signed)
Caller: [name]/Patient; Phone: [phone]; Reason for Call: Patient calling back regarding a message that was left earlier this morning.  She wanted to add that her face & legs are broke out and her hands are itching.  States she can not take the Zoloft.  Please call her back.  Thanks

## 2012-01-07 NOTE — Telephone Encounter (Signed)
Patient notified and will stop zoloft.

## 2012-01-07 NOTE — Telephone Encounter (Signed)
Caller: [name]/Patient; Patient Name: Cheyenne Pierce; PCP: Eustaquio Boyden [facility]); Best Callback Phone Number: [phone]   started Zoloft on 01-02-12 and on the 28th started feeling bad, having diarrhea not sleeping   All emergent sxs per Diarrhea protocols ruled out except for Diarrhea longer than 24 hours and not improving with home care   She refused appt,she said she has one on Friday that she just wants to come off her Zoloft feels that is what is causing her symptoms   I tried to explain that you can feel bad for a couple weeks until your body adjust but she is wanting off med

## 2012-01-07 NOTE — Telephone Encounter (Signed)
Agree 

## 2012-01-07 NOTE — Telephone Encounter (Signed)
May just stop as has been on less than 1 week.

## 2012-01-07 NOTE — Telephone Encounter (Signed)
FYI See below 

## 2012-01-07 NOTE — Telephone Encounter (Signed)
Pt reports same symptoms she had with metoprolol she is having with bystolic. She continues to have insomnia and GI symptoms and wants to stop Bystolic and Zoloft, since both meds are new.  I explained she should discuss Zoloft with PCP and should only try stopping/holding 1 med at a time so we can find out which one is causing symptoms.  She will try holding Bystolic x 2 days and see if she notices any changes in symptoms. She will call us back in a couple of days to keep [place] informed.

## 2012-01-07 NOTE — Telephone Encounter (Signed)
Pt called stating the she cant take the bystolic its causing her to not sleep and wants to talk to a nurse about it.

## 2012-01-09 ENCOUNTER — Ambulatory Visit (INDEPENDENT_AMBULATORY_CARE_PROVIDER_SITE_OTHER): Payer: Medicare Other | Admitting: Family Medicine

## 2012-01-09 ENCOUNTER — Encounter: Payer: Self-pay | Admitting: Family Medicine

## 2012-01-09 VITALS — BP 128/72 | HR 68 | Temp 97.9°F | Ht 61.0 in | Wt 142.8 lb

## 2012-01-09 DIAGNOSIS — F411 Generalized anxiety disorder: Secondary | ICD-10-CM

## 2012-01-09 DIAGNOSIS — E039 Hypothyroidism, unspecified: Secondary | ICD-10-CM

## 2012-01-09 DIAGNOSIS — E871 Hypo-osmolality and hyponatremia: Secondary | ICD-10-CM

## 2012-01-09 DIAGNOSIS — Z0000 Encounter for general adult medical examination without abnormal findings: Secondary | ICD-10-CM

## 2012-01-09 DIAGNOSIS — R739 Hyperglycemia, unspecified: Secondary | ICD-10-CM

## 2012-01-09 DIAGNOSIS — M899 Disorder of bone, unspecified: Secondary | ICD-10-CM

## 2012-01-09 DIAGNOSIS — I10 Essential (primary) hypertension: Secondary | ICD-10-CM

## 2012-01-09 DIAGNOSIS — R7309 Other abnormal glucose: Secondary | ICD-10-CM

## 2012-01-09 LAB — BASIC METABOLIC PANEL
BUN: 5 mg/dL — ABNORMAL LOW (ref 6–23)
CO2: 30 mEq/L (ref 19–32)
Chloride: 92 mEq/L — ABNORMAL LOW (ref 96–112)
Creatinine, Ser: 0.7 mg/dL (ref 0.4–1.2)
Potassium: 2.9 mEq/L — ABNORMAL LOW (ref 3.5–5.1)

## 2012-01-09 LAB — HEMOGLOBIN A1C: Hgb A1c MFr Bld: 6.1 % (ref 4.6–6.5)

## 2012-01-09 MED ORDER — LEVOTHYROXINE SODIUM 75 MCG PO TABS: 75.0000 ug | ORAL_TABLET | Freq: Every day | ORAL | Status: DC

## 2012-01-09 NOTE — Patient Instructions (Signed)
Good to see you today. Call us with questions. Blood work today. Return in 2 months for recheck.

## 2012-01-09 NOTE — Progress Notes (Addendum)
Subjective:    Patient ID: Cheyenne Pierce, female    DOB: [birth year], 77 y.o.   MRN: [id number]  HPI CC: medicare wellness  Recently started on bystolic by cards and sertraline by myself, bad side effects to this - rash on body and in mouth, "burning in digestive tract" so self stopped both.  Unsure what med she reacted to.  Feeling some better but still under the weather.  Also recently had received flu shot.  Desires to avoid any new medicines for now, declines counseling for now.  Sertraline was started with worsening anxiety.  Hearing and vision screen passed today.  Gets yearly eye exams - 12/2011 by Dr. Blondell Reveal at [facility]  Preventative: Colonoscopy 07/2009 at [facility], told normal per pt Alessandra Bevels) [facility] with OBGYN - Pap smear - 11/2011 [facility] ([place]) Mammogram - 07/2011 normal diagnostic mammo Birads1, rec rpt 1 yr.  Breast exam with OBGYN Dexa - 12/2011 with Dr. Greggory Keen - pt thinks normal. Flu - 12/2011 Shingles shot 07/2011 Pneumovax 2006 - completed Td 2007 Advanced directives: living will at home.  ==>brought copy and scanned.  HCPOA is [employer]  Several falls in last year.  Last fall was a few weeks ago, fell onto back on ground.  Has fallen down steps in past.  No prodromal sxs.  Some dizziness and unsteady.  Has never used ambulatory assistive devices.  Declines PT eval today.  Daily caffeine use 6 cups every day Lives with husband Grown children nearby Activity: no regular exercise Diet: good water, fruits daily, some vegetables  Medications and allergies reviewed and updated in chart.  Past histories reviewed and updated if relevant as below. Patient Active Problem List  Diagnosis  . HYPOTHYROIDISM  . ANXIETY  . NEUROPATHY  . HYPERTENSION  . ALLERGIC RHINITIS  . REFLUX ESOPHAGITIS  . GERD  . INGUINAL HERNIA, LEFT  . CONSTIPATION  . IRRITABLE BOWEL SYNDROME  . OTHER CHRONIC CYSTITIS  . OSTEOPENIA  . DIARRHEA  . Frontal  Sinus Pain  . Chest pain  . Murmur   Past Medical History  Diagnosis Date  . Allergy   . Anxiety   . Hypertension   . Osteopenia   . IBS (irritable bowel syndrome)   . Thyroid disease   . GERD (gastroesophageal reflux disease)   . Gastritis    Past Surgical History  Procedure Date  . Appendectomy 1948  . Abdominal hysterectomy 1968    left ovary remains  . Ovarian cyst surgery 1963    cyst on ovaries  . Cardiac catheterization 09/26/1998  . Hernia repair   . Colostomy w/ rectocele repair 06/2004  . Ventral hernia repair 08/2008    abdominal wall, lysis of adhesions  . Colonoscopy 07/2009    [facility]), normal per pt   History  Substance Use Topics  . Smoking status: Former Smoker -- 1.0 packs/day for 30 years    Quit date: 05/19/1978  . Smokeless tobacco: Never Used  . Alcohol Use: No   Family History  Problem Relation Age of Onset  . Heart disease Mother   . Stroke Brother     2 strokes  . Hypertension Neg Hx   . Diabetes Neg Hx   . Cancer Neg Hx     breast or colon cancer, no history   Allergies  Allergen Reactions  . Zoloft (Sertraline Hcl) Rash    Oral blisters and skin rash  . Amoxicillin     REACTION: diarrhea  .  Codeine Phosphate     REACTION: nausea  . Metronidazole     REACTION: Rash, tongue ulcers  . Oxcarbazepine     REACTION: rash  . Perindopril Erbumine     REACTION: unspecified  . Prednisone     REACTION: swelling  . Tramadol Hcl     REACTION: diarrhea   Current Outpatient Prescriptions on File Prior to Visit  Medication Sig Dispense Refill  . amLODipine (NORVASC) 5 MG tablet Take 10 mg by mouth daily.      . busPIRone (BUSPAR) 15 MG tablet Take 1 tablet (15 mg total) by mouth 2 (two) times daily.      . Calcium Carbonate-Vitamin D (CALCIUM 600+D HIGH POTENCY) 600-400 MG-UNIT per tablet Take 1 tablet by mouth daily.      . diclofenac (FLECTOR) 1.3 % PTCH Place 1 patch onto the skin as needed.        . diphenhydrAMINE  (BENADRYL) 25 mg capsule Take 25 mg by mouth at bedtime as needed.      Marland Kitchen estradiol (ESTRACE) 0.5 MG tablet take 1 tablet by mouth once daily  30 tablet  12  . fluticasone (FLONASE) 50 MCG/ACT nasal spray Place 2 sprays into the nose daily.  16 g  3  . gabapentin (NEURONTIN) 300 MG capsule Take 600 mg by mouth 3 (three) times daily.       . hyoscyamine (LEVSIN, ANASPAZ) 0.125 MG tablet Take 0.125 mg by mouth as needed.        Marland Kitchen levothyroxine (SYNTHROID, LEVOTHROID) 75 MCG tablet Take 1 tablet (75 mcg total) by mouth daily.  90 tablet  3  . Loperamide-Simethicone (IMODIUM ADVANCED) 2-125 MG TABS Take by mouth.        . meclizine (ANTIVERT) 25 MG tablet Take 25 mg by mouth 3 (three) times daily as needed.        . Multiple Vitamins-Minerals (CENTRUM SILVER PO) Take by mouth.        . nitroGLYCERIN (NITROSTAT) 0.4 MG SL tablet Place 0.4 mg under the tongue every 5 (five) minutes as needed.      . pantoprazole (PROTONIX) 40 MG tablet take 1 tablet by mouth twice a day  60 tablet  10  . Probiotic Product (ALIGN) 4 MG CAPS Take by mouth.        . ranitidine (ZANTAC) 150 MG capsule Take 150 mg by mouth 2 (two) times daily.        . Tragacanth (ASTRAGALUS ROOT) POWD Take by mouth daily.        Marland Kitchen triamterene-hydrochlorothiazide (MAXZIDE-25) 37.5-25 MG per tablet take 1 tablet by mouth once daily  30 tablet  11  . vitamin B-12 (CYANOCOBALAMIN) 1000 MCG tablet Take 1,000 mcg by mouth daily.           Review of Systems  Constitutional: Positive for chills (after flu shot) and appetite change (with recent meds). Negative for fever, activity change, fatigue and unexpected weight change.  HENT: Negative for hearing loss and neck pain.   Eyes: Negative for visual disturbance.  Respiratory: Negative for cough, chest tightness, shortness of breath and wheezing.   Cardiovascular: Negative for chest pain, palpitations and leg swelling.  Gastrointestinal: Positive for diarrhea (ibs) and constipation (ibs).  Negative for nausea, vomiting, abdominal pain, blood in stool and abdominal distention.  Genitourinary: Negative for hematuria and difficulty urinating.  Musculoskeletal: Negative for myalgias and arthralgias.  Skin: Positive for rash (with one of new meds, now stopped and resolved).  Neurological: Positive  for headaches (with new medicines that have now been stopped). Negative for dizziness, seizures and syncope.  Hematological: Does not bruise/bleed easily.  Psychiatric/Behavioral: Positive for dysphoric mood. The patient is nervous/anxious.        Objective:   Physical Exam  Nursing note and vitals reviewed. Constitutional: She is oriented to person, place, and time. She appears well-developed and well-nourished. No distress.  HENT:  Head: Normocephalic and atraumatic.  Right Ear: External ear normal.  Left Ear: External ear normal.  Nose: Nose normal.  Mouth/Throat: Uvula is midline, oropharynx is clear and moist and mucous membranes are normal. No oropharyngeal exudate, posterior oropharyngeal edema, posterior oropharyngeal erythema or tonsillar abscesses.  Eyes: Conjunctivae normal and EOM are normal. Pupils are equal, round, and reactive to light. No scleral icterus.  Neck: Normal range of motion. Neck supple. No thyromegaly present.  Cardiovascular: Normal rate, regular rhythm, normal heart sounds and intact distal pulses.   No murmur heard. Pulses:      Radial pulses are 2+ on the right side, and 2+ on the left side.  Pulmonary/Chest: Effort normal and breath sounds normal. No respiratory distress. She has no wheezes. She has no rales.       Breast exam: deferred (done at well woman exam)  Abdominal: Soft. Bowel sounds are normal. She exhibits no distension and no mass. There is no tenderness. There is no rebound and no guarding.  Musculoskeletal: Normal range of motion. She exhibits no edema.  Lymphadenopathy:    She has no cervical adenopathy.  Neurological: She is alert  and oriented to person, place, and time.       CN grossly intact, station and gait intact  Skin: Skin is warm and dry. No rash noted.  Psychiatric: She has a normal mood and affect. Her behavior is normal. Judgment and thought content normal.       Assessment & Plan:

## 2012-01-11 ENCOUNTER — Encounter: Payer: Self-pay | Admitting: Family Medicine

## 2012-01-11 ENCOUNTER — Other Ambulatory Visit: Payer: Self-pay | Admitting: Family Medicine

## 2012-01-11 DIAGNOSIS — E871 Hypo-osmolality and hyponatremia: Secondary | ICD-10-CM | POA: Insufficient documentation

## 2012-01-11 DIAGNOSIS — E876 Hypokalemia: Secondary | ICD-10-CM

## 2012-01-11 DIAGNOSIS — Z0000 Encounter for general adult medical examination without abnormal findings: Secondary | ICD-10-CM | POA: Insufficient documentation

## 2012-01-11 MED ORDER — POTASSIUM CHLORIDE CRYS ER 20 MEQ PO TBCR: 20.0000 meq | EXTENDED_RELEASE_TABLET | Freq: Every day | ORAL | Status: DC

## 2012-01-11 NOTE — Assessment & Plan Note (Addendum)
Did not tolerate at all SSRI.  Has decreased buspar to 15mg  bid.  I wonder if this was causing some of her dizziness, will continue to monitor this complaint on lower dose of buspar. Consider SNRI in future, pt does not want to start currently given poor experience with zoloft. Will continue to monitor for now. PHQ9 = 5, not at all difficult to function in day to day activities GAD7 = 16/21 Significantly more anxiety, doubt depression playing part in this. Will return in 2 mo to recheck chronic GAD, continued significant family stressors (see prior note).

## 2012-01-11 NOTE — Assessment & Plan Note (Signed)
Mild on blood work this week - recheck bmp today.  ?effect of SSRI, current off med.

## 2012-01-11 NOTE — Assessment & Plan Note (Signed)
Chronic, stable. Continue med. BP Readings from Last 3 Encounters:  01/09/12 128/72  01/01/12 130/80  12/30/11 120/70

## 2012-01-11 NOTE — Addendum Note (Signed)
Addended by: Eustaquio Boyden on: 01/11/2012 12:34 PM   Modules accepted: Level of Service

## 2012-01-11 NOTE — Assessment & Plan Note (Signed)
According to pt, normal dexa last month, however had dx osteopenia in chart.   I will try to get copy of latest dexa.

## 2012-01-11 NOTE — Assessment & Plan Note (Signed)
Chronic, stable. Continue med at current dose.

## 2012-01-11 NOTE — Assessment & Plan Note (Addendum)
I have personally reviewed the Medicare Annual Wellness questionnaire and have noted 1. The patient's medical and social history 2. Their use of alcohol, tobacco or illicit drugs 3. Their current medications and supplements 4. The patient's functional ability including ADL's, fall risks, home safety risks and hearing or visual impairment. 5. Diet and physical activity 6. Evidence for depression or mood disorders The patients weight, height, BMI have been recorded in the chart.  Hearing and vision has been addressed. I have made referrals, counseling and provided education to the patient based review of the above and I have provided the pt with a written personalized care plan for preventive services. See scanned questionairre. Advanced directives discussed: states we have copy of living will - will look for this in chart.  If not present, will ask her to bring copy in  Reviewed preventative protocols and updated unless pt declined.  UTD on all immunizations. Fall risk given h/o falls in past, however declines PT currently. Reviewed blood work in detail.

## 2012-01-12 ENCOUNTER — Telehealth: Payer: Self-pay | Admitting: Cardiovascular Disease

## 2012-01-12 NOTE — Telephone Encounter (Signed)
Pt says she has acid reflux among other problems with esophagus and wants to try to get as many meds changed to small capsules as possible.  She confirms compliance with med and says it doesn't stop her from taking but wants to know if their is an alternative to this tablet that can be taken in capsule form.  I explained Dr. Kirke Corin out of office through next week. I will wait and discuss with him. She verb. Understanding and confirms she has enough tabs to last her until then.

## 2012-01-12 NOTE — Telephone Encounter (Signed)
Please see below and advise. Thanks

## 2012-01-12 NOTE — Telephone Encounter (Signed)
Pt wants to know if there is a capsule that the pt can take for the triamterene-hydrochlorothiazide (MAXZIDE-25) 37.5-25 MG per tablet. She is unable to swallow the pill

## 2012-01-13 ENCOUNTER — Other Ambulatory Visit: Payer: Self-pay | Admitting: Family Medicine

## 2012-01-13 MED ORDER — POTASSIUM CHLORIDE 20 MEQ/15ML (10%) PO LIQD: 20.0000 meq | Freq: Every day | ORAL | Status: DC

## 2012-01-17 ENCOUNTER — Telehealth: Payer: Self-pay | Admitting: Family Medicine

## 2012-01-17 NOTE — Telephone Encounter (Signed)
plz notify I don't see copy of living will in chart - please have her bring in copy of the one she has at home.  Thanks.

## 2012-01-19 NOTE — Telephone Encounter (Signed)
I am not aware that Maxide comes in a capsule form. She can check with her pharmacist though and see if it's available in that formulation.

## 2012-01-19 NOTE — Telephone Encounter (Signed)
Please see below and advise. Thanks

## 2012-01-19 NOTE — Telephone Encounter (Signed)
Pt is calling back to find out if she can get her tablet changed to a capsule since it is hard to swallow.  Pt states she has one pill left and would like to change her pill form with the pharmacy if possible.  Please call pt back to advise.

## 2012-01-20 NOTE — Telephone Encounter (Signed)
Patient notified and will bring a copy at her next visit.

## 2012-01-20 NOTE — Telephone Encounter (Signed)
Pt informed. Understanding verb. She will check with pharmacy

## 2012-01-21 ENCOUNTER — Other Ambulatory Visit (INDEPENDENT_AMBULATORY_CARE_PROVIDER_SITE_OTHER): Payer: Medicare Other

## 2012-01-21 ENCOUNTER — Other Ambulatory Visit: Payer: Medicare Other

## 2012-01-21 DIAGNOSIS — E876 Hypokalemia: Secondary | ICD-10-CM

## 2012-01-21 DIAGNOSIS — E871 Hypo-osmolality and hyponatremia: Secondary | ICD-10-CM

## 2012-01-21 LAB — BASIC METABOLIC PANEL
BUN: 9 mg/dL (ref 6–23)
CO2: 30 mEq/L (ref 19–32)
Calcium: 9.5 mg/dL (ref 8.4–10.5)
Chloride: 98 mEq/L (ref 96–112)
Creatinine, Ser: 0.7 mg/dL (ref 0.4–1.2)
GFR: 83.81 mL/min (ref >60.00)
Glucose, Bld: 96 mg/dL (ref 70–99)
Potassium: 3.7 mEq/L (ref 3.5–5.1)
Sodium: 135 mEq/L (ref 135–145)

## 2012-01-30 ENCOUNTER — Ambulatory Visit: Payer: Medicare Other | Admitting: Family Medicine

## 2012-02-17 ENCOUNTER — Ambulatory Visit: Payer: Self-pay | Admitting: Pain Medicine

## 2012-03-01 ENCOUNTER — Ambulatory Visit: Payer: Self-pay | Admitting: Pain Medicine

## 2012-03-12 ENCOUNTER — Ambulatory Visit (INDEPENDENT_AMBULATORY_CARE_PROVIDER_SITE_OTHER): Payer: Medicare Other | Admitting: Family Medicine

## 2012-03-12 ENCOUNTER — Encounter: Payer: Self-pay | Admitting: Family Medicine

## 2012-03-12 ENCOUNTER — Encounter: Payer: Medicare Other | Admitting: Internal Medicine

## 2012-03-12 VITALS — BP 138/84 | HR 78 | Temp 97.8°F | Wt 142.0 lb

## 2012-03-12 DIAGNOSIS — E0789 Other specified disorders of thyroid: Secondary | ICD-10-CM | POA: Insufficient documentation

## 2012-03-12 DIAGNOSIS — F411 Generalized anxiety disorder: Secondary | ICD-10-CM

## 2012-03-12 MED ORDER — PANTOPRAZOLE SODIUM 40 MG PO TBEC: 40.0000 mg | DELAYED_RELEASE_TABLET | Freq: Every day | ORAL | Status: DC

## 2012-03-12 NOTE — Patient Instructions (Addendum)
Good to see you today.  Call us with questions. Thanks for the mints and card! I think anxiety is doing well.  Continue buspar 15mg  twice daily, with a third as needed.  Keep an eye on dizziness. For thyroid tenderness - take tylenol to see if it will help, and warm compresses to neck.  Call me in 1-2 weeks with update - if enlarging any or if not improving we will check thyroid ultrasound.

## 2012-03-12 NOTE — Progress Notes (Signed)
  Subjective:    Patient ID: Cheyenne Pierce, female    DOB: [birth year], 77 y.o.   MRN: [id number]  HPI CC: 2 mo f/u anxiety  dizzines some better.  Still occasionally present, associated pain left sinus and behind eye.  Meclizine and zinc helps.  Doesn't feel congested with this.  No h/o shingles.  Did receive zostavax.  Anxiety improved as well.  Only taking buspar 15mg  bid.  Taking 3rd buspar in middle of day.  Doesn't want change currently. Son doing ok - one with colon cancer back to work!  One with mouth/tongue cancer doing well too.  He lives in [place].  Peripheral neuropathy - sees Dr. Laban Emperor at [facility] - last one was 02/2012.  Last MRI was 10/2009.  On neurontin for this.  Also noticing 1 wk h/o swelling and pain right neck.  No fmhx thyroid cancer.  No h/o neck irradiation.  No fevers/chills, cold sxs. Lab Results  Component Value Date   TSH 0.76 01/01/2012    Past Medical History  Diagnosis Date  . Allergy   . Anxiety   . Hypertension   . Osteopenia   . IBS (irritable bowel syndrome)   . Hypothyroidism   . GERD (gastroesophageal reflux disease)   . Gastritis      Review of Systems Per HPI    Objective:   Physical Exam  Nursing note and vitals reviewed. Constitutional: She appears well-developed and well-nourished. No distress.  Neck: No thyromegaly present.       Tender at R thyroid lobe.  No nodule noted  Lymphadenopathy:    She has no cervical adenopathy.  Psychiatric: She has a normal mood and affect.       Assessment & Plan:

## 2012-03-12 NOTE — Assessment & Plan Note (Signed)
Of 1 wk duration, painful right neck at R thyroid lobe.  ?transient thyroiditis.  Recommend monitor - treat with tylenol, warm compresses to right neck.  No evidence of infection currently.  No palpable nodules or masses.  No thyromegaly. If not improving into next week, call me for thyroid ultrasound.

## 2012-03-12 NOTE — Assessment & Plan Note (Signed)
Anxiety is currently well controlled.  Doing well with buspar bid to tid. Bad reaction to SSRI in past.  Not interested in further medication currently.

## 2012-03-17 ENCOUNTER — Other Ambulatory Visit: Payer: Self-pay | Admitting: *Deleted

## 2012-03-17 NOTE — Telephone Encounter (Signed)
Ok to refill 

## 2012-03-18 MED ORDER — HYOSCYAMINE SULFATE 0.125 MG PO TABS: 0.1250 mg | ORAL_TABLET | ORAL | Status: DC | PRN

## 2012-04-09 ENCOUNTER — Encounter: Payer: Self-pay | Admitting: Cardiovascular Disease

## 2012-04-09 ENCOUNTER — Ambulatory Visit (INDEPENDENT_AMBULATORY_CARE_PROVIDER_SITE_OTHER): Payer: Medicare Other | Admitting: Cardiovascular Disease

## 2012-04-09 VITALS — BP 104/72 | HR 75 | Ht 61.0 in | Wt 143.0 lb

## 2012-04-09 DIAGNOSIS — I10 Essential (primary) hypertension: Secondary | ICD-10-CM

## 2012-04-09 DIAGNOSIS — R011 Cardiac murmur, unspecified: Secondary | ICD-10-CM

## 2012-04-09 DIAGNOSIS — I4949 Other premature depolarization: Secondary | ICD-10-CM

## 2012-04-09 DIAGNOSIS — I493 Ventricular premature depolarization: Secondary | ICD-10-CM

## 2012-04-09 DIAGNOSIS — R079 Chest pain, unspecified: Secondary | ICD-10-CM

## 2012-04-09 NOTE — Patient Instructions (Addendum)
Continue same medications  Follow up in 1 year

## 2012-04-10 DIAGNOSIS — I493 Ventricular premature depolarization: Secondary | ICD-10-CM | POA: Insufficient documentation

## 2012-04-10 NOTE — Assessment & Plan Note (Signed)
She has mild palpitations related to PVCs. She did not tolerate beta blockers. Continue to monitor for now.

## 2012-04-10 NOTE — Progress Notes (Signed)
HPI  This is a 78 year old female who is here today for a followup visit.  The patient had recurrent chest pain throughout her life. She had cardiac evaluation in 2009 by Dr. Tenny Craw. She underwent an echocardiogram and adenosine Myoview at that time. Both of them were unremarkable. The patient suffers from severe GERD. Her symptoms in the past improved after the addition of amlodipine.  She underwent evaluation with a nuclear stress test which showed no evidence of ischemia with normal ejection fraction. She continued to have exertional chest pain and palpitations. Thus, metoprolol 25 mg twice daily was added. This was stopped later due to complaints of nightmares. It was switched to Bystolic that she had similar reaction to this. She has been off the beta blocker. She thinks some of her palpitations are due to Protonix. She decreased the dose to once daily.  Allergies  Allergen Reactions  . Zoloft (Sertraline Hcl) Rash    Oral blisters and skin rash  . Amoxicillin     REACTION: diarrhea  . Codeine Phosphate     REACTION: nausea  . Metronidazole     REACTION: Rash, tongue ulcers  . Oxcarbazepine     REACTION: rash  . Perindopril Erbumine     REACTION: unspecified  . Prednisone     REACTION: swelling  . Tramadol Hcl     REACTION: diarrhea     Current Outpatient Prescriptions on File Prior to Visit  Medication Sig Dispense Refill  . acetaminophen (TYLENOL) 500 MG tablet Take 500 mg by mouth every 6 (six) hours as needed.      . busPIRone (BUSPAR) 15 MG tablet Take 1 tablet (15 mg total) by mouth 2 (two) times daily.      . Calcium Carbonate-Vitamin D (CALCIUM 600+D HIGH POTENCY) 600-400 MG-UNIT per tablet Take 1 tablet by mouth daily.      . diclofenac (FLECTOR) 1.3 % PTCH Place 1 patch onto the skin as needed.        . diphenhydrAMINE (BENADRYL) 25 mg capsule Take 25 mg by mouth at bedtime as needed.      Marland Kitchen estradiol (ESTRACE) 0.5 MG tablet take 1 tablet by mouth once daily  30  tablet  12  . fluticasone (FLONASE) 50 MCG/ACT nasal spray Place 2 sprays into the nose as needed.      . gabapentin (NEURONTIN) 300 MG capsule Take 600 mg by mouth 3 (three) times daily.       . hyoscyamine (LEVSIN, ANASPAZ) 0.125 MG tablet Take 1 tablet (0.125 mg total) by mouth as needed.  30 tablet  0  . levothyroxine (SYNTHROID, LEVOTHROID) 75 MCG tablet Take 1 tablet (75 mcg total) by mouth daily.  30 tablet  11  . meclizine (ANTIVERT) 25 MG tablet Take 25 mg by mouth 3 (three) times daily as needed.        . Multiple Vitamins-Minerals (CENTRUM SILVER PO) Take by mouth.        . nitroGLYCERIN (NITROSTAT) 0.4 MG SL tablet Place 0.4 mg under the tongue every 5 (five) minutes as needed.      . pantoprazole (PROTONIX) 40 MG tablet Take 1 tablet (40 mg total) by mouth daily.  90 tablet  3  . Probiotic Product (ALIGN) 4 MG CAPS Take by mouth.        . Tragacanth (ASTRAGALUS ROOT) POWD Take by mouth daily.        Marland Kitchen triamterene-hydrochlorothiazide (MAXZIDE-25) 37.5-25 MG per tablet take 1 tablet by mouth once daily  30 tablet  11  . vitamin B-12 (CYANOCOBALAMIN) 1000 MCG tablet Take 1,000 mcg by mouth daily.           Past Medical History  Diagnosis Date  . Allergy   . Anxiety   . Hypertension   . Osteopenia   . IBS (irritable bowel syndrome)   . Hypothyroidism   . GERD (gastroesophageal reflux disease)   . Gastritis   . Peripheral neuropathy     in feet, ESI have helped Laban Emperor)     Past Surgical History  Procedure Date  . Appendectomy 1948  . Abdominal hysterectomy 1968    left ovary remains  . Ovarian cyst surgery 1963    cyst on ovaries  . Cardiac catheterization 09/26/1998  . Hernia repair   . Colostomy w/ rectocele repair 06/2004  . Ventral hernia repair 08/2008    abdominal wall, lysis of adhesions  . Colonoscopy 07/2009    [facility]), normal per pt  . Epidural steroid injections multiple    help periph neuropathy     Family History  Problem Relation Age  of Onset  . Heart disease Mother   . Stroke Brother     2 strokes  . Hypertension Neg Hx   . Diabetes Neg Hx   . Cancer Neg Hx     breast or colon cancer, no history     History   Social History  . Marital Status: Widowed    Spouse Name: N/A    Number of Children: 5  . Years of Education: N/A   Occupational History  . Not on file.   Social History Main Topics  . Smoking status: Former Smoker -- 1.0 packs/day for 30 years    Quit date: 05/19/1978  . Smokeless tobacco: Never Used  . Alcohol Use: No  . Drug Use: No  . Sexually Active: Not on file   Other Topics Concern  . Not on file   Social History Narrative   Daily caffeine use 6 cups every dayLives with husbandGrown children nearbyActivity: no regular exerciseDiet: good water, fruits daily, some vegetables     PHYSICAL EXAM   BP 104/72  Pulse 75  Ht 5\' 1"  (1.549 m)  Wt 143 lb (64.864 kg)  BMI 27.02 kg/m2 Constitutional: She is oriented to person, place, and time. She appears well-developed and well-nourished. No distress.  HENT: No nasal discharge.  Head: Normocephalic and atraumatic.  Eyes: Pupils are equal and round. Right eye exhibits no discharge. Left eye exhibits no discharge.  Neck: Normal range of motion. Neck supple. No JVD present. No thyromegaly present.  Cardiovascular: Normal rate, regular rhythm, normal heart sounds. Exam reveals no gallop and no friction rub. There is one out of 6 systolic ejection murmur at the base of the heart. Pulmonary/Chest: Effort normal and breath sounds normal. No stridor. No respiratory distress. She has no wheezes. She has no rales. She exhibits no tenderness.  Abdominal: Soft. Bowel sounds are normal. She exhibits no distension. There is no tenderness. There is no rebound and no guarding.  Musculoskeletal: Normal range of motion. She exhibits no edema and no tenderness.  Neurological: She is alert and oriented to person, place, and time. Coordination normal.  Skin:  Skin is warm and dry. No rash noted. She is not diaphoretic. No erythema. No pallor.  Psychiatric: She has a normal mood and affect. Her behavior is normal. Judgment and thought content normal.   YNW:GNFAO  Rhythm  - frequent ectopic ventricular beat s  #  VECs = 3 -Left axis -anterior fascicular block.   -Poor R-wave progression -may be secondary to pulmonary disease   consider old anterior infarct.   Low voltage with rightward P-axis and rotation -possible pulmonary disease.   ASSESSMENT AND PLAN

## 2012-04-10 NOTE — Assessment & Plan Note (Signed)
There was only mild mitral and aortic insufficiency. No other significant valvular abnormalities.

## 2012-04-10 NOTE — Assessment & Plan Note (Addendum)
She reports no further chest pain. Stress testing was negative. Most of her symptoms seems to be related to GERD. I advised her to followup with gastroenterology.

## 2012-04-10 NOTE — Assessment & Plan Note (Signed)
Blood pressure is well controlled 

## 2012-05-13 ENCOUNTER — Encounter: Payer: Self-pay | Admitting: Family Medicine

## 2012-05-13 ENCOUNTER — Ambulatory Visit (INDEPENDENT_AMBULATORY_CARE_PROVIDER_SITE_OTHER): Payer: Medicare Other | Admitting: Family Medicine

## 2012-05-13 ENCOUNTER — Ambulatory Visit (INDEPENDENT_AMBULATORY_CARE_PROVIDER_SITE_OTHER)
Admission: RE | Admit: 2012-05-13 | Discharge: 2012-05-13 | Disposition: A | Discharge status: Home / Self Care | Payer: Medicare Other | Source: Ambulatory Visit | Attending: Family Medicine | Admitting: Family Medicine

## 2012-05-13 VITALS — BP 128/68 | HR 74 | Temp 97.7°F | Wt 145.8 lb

## 2012-05-13 DIAGNOSIS — M549 Dorsalgia, unspecified: Secondary | ICD-10-CM

## 2012-05-13 NOTE — Progress Notes (Signed)
  Subjective:    Patient ID: Cheyenne Pierce, female    DOB: 11-16-1934, 78 y.o.   MRN: [id number]  HPI CC: ?shingles - leg pain  5d h/o "irritation under skin" thoracic back between shoulder blades, tender to touch from cervical spine to lumbar spine.  Stinging pain and itch, worse with scratching.  Also endorses shooting pain down left arm.    No h/o shingles.  Possible small rash by left neck that has since resolved. No neck stiffness or fevers/chills.  Did receive shingles vaccine last April 2013.  Did have a fall Christmas day - on face at daughter's kitchen floor - tripped over dog.  Larey Seat again 1 week later at [employer] - tripped down stairs.  Hit left arm both times.  Denies dizziness, lightheaded prior to fall.  H/o L lumbar radiculopathy that has been treated in past by [facility] which helps.  Also using flector patch for lumbar spine pain.  Sees Dr. Laban Emperor.  Last [facility] 02/2012.  No recent changes in meds. On gabapentin 600mg  tid for periph neuropathy.  On flector patch which helps. Does not tolerate oral NSAIDs 2/2 stomach pain.  Past Medical History  Diagnosis Date  . Allergy   . Anxiety   . Hypertension   . Osteopenia   . IBS (irritable bowel syndrome)   . Hypothyroidism   . GERD (gastroesophageal reflux disease)   . Gastritis   . Peripheral neuropathy     in feet, ESI have helped Laban Emperor)   Past Surgical History  Procedure Date  . Appendectomy 1948  . Abdominal hysterectomy 1968    left ovary remains  . Ovarian cyst surgery 1963    cyst on ovaries  . Cardiac catheterization 09/26/1998  . Hernia repair   . Colostomy w/ rectocele repair 06/2004  . Ventral hernia repair 08/2008    abdominal wall, lysis of adhesions  . Colonoscopy 07/2009    [facility]), normal per pt  . Epidural steroid injections multiple    help periph neuropathy    Review of Systems Per HPI    Objective:   Physical Exam  Nursing note and vitals reviewed. Constitutional: She is oriented  to person, place, and time. She appears well-developed and well-nourished. No distress.  Musculoskeletal: She exhibits no edema.       Midline spine tenderness to palpation from lower cervical to mid thoracic spine. Tender to palpation paraspinous mm as well as significant tightness noted.  Neurological: She is alert and oriented to person, place, and time. She has normal strength. No sensory deficit.  Reflex Scores:      Bicep reflexes are 2+ on the right side and 1+ on the left side.      5/5 strength BUE Sensation intact. Diminished reflexes L biceps DTR.  Skin: Skin is warm and dry. No rash noted.       No vesicular rash noted  Psychiatric: She has a normal mood and affect.       Assessment & Plan:

## 2012-05-13 NOTE — Patient Instructions (Signed)
Continue heating pad to spine. Xray today.  We will call you with results. Let us know if you want to try something stronger for pain than tylenol.   Continue flector patch. May be time to talk with Dr. Shireen Quan about mid and upper back pain

## 2012-05-14 ENCOUNTER — Encounter: Payer: Self-pay | Admitting: Family Medicine

## 2012-05-14 NOTE — Assessment & Plan Note (Signed)
In known moderate to severe DDD throughout spine. Distribution of pain not consistent with shingles. Anticipate coming from DDD. Check thoracic xrays today given midline pain and recent falls to eval for fracture or other cause of pain. Discussed this, recommended continue flector patch, increase tylenol, and continue heating pad on back - and suggested further eval by Dr. Laban Emperor for other modailties.   Pt declines stronger pain medication.

## 2012-06-29 ENCOUNTER — Other Ambulatory Visit: Payer: Self-pay

## 2012-06-29 DIAGNOSIS — Z1231 Encounter for screening mammogram for malignant neoplasm of breast: Secondary | ICD-10-CM

## 2012-07-13 ENCOUNTER — Other Ambulatory Visit: Payer: Self-pay | Admitting: Cardiovascular Disease

## 2012-07-23 ENCOUNTER — Ambulatory Visit
Admission: RE | Admit: 2012-07-23 | Discharge: 2012-07-23 | Disposition: A | Discharge status: Home / Self Care | Payer: Medicare Other | Source: Ambulatory Visit

## 2012-07-23 DIAGNOSIS — Z1231 Encounter for screening mammogram for malignant neoplasm of breast: Secondary | ICD-10-CM

## 2012-07-26 ENCOUNTER — Encounter: Payer: Self-pay | Admitting: *Deleted

## 2012-07-27 LAB — FECAL OCCULT BLOOD, GUAIAC: Fecal Occult Blood: NEGATIVE

## 2012-08-16 ENCOUNTER — Other Ambulatory Visit: Payer: Self-pay | Admitting: Internal Medicine

## 2012-09-06 ENCOUNTER — Other Ambulatory Visit: Payer: Self-pay | Admitting: Family Medicine

## 2012-09-15 ENCOUNTER — Ambulatory Visit (INDEPENDENT_AMBULATORY_CARE_PROVIDER_SITE_OTHER): Payer: Medicare Other | Admitting: Family Medicine

## 2012-09-15 ENCOUNTER — Encounter: Payer: Self-pay | Admitting: Family Medicine

## 2012-09-15 VITALS — BP 136/82 | HR 84 | Temp 98.1°F | Wt 142.2 lb

## 2012-09-15 DIAGNOSIS — M79605 Pain in left leg: Secondary | ICD-10-CM

## 2012-09-15 DIAGNOSIS — M79604 Pain in right leg: Secondary | ICD-10-CM

## 2012-09-15 DIAGNOSIS — M79609 Pain in unspecified limb: Secondary | ICD-10-CM

## 2012-09-15 MED ORDER — DICLOFENAC SODIUM 1 % TD GEL: 1.0000 "application " | Freq: Three times a day (TID) | TRANSDERMAL | Status: DC

## 2012-09-15 NOTE — Assessment & Plan Note (Signed)
With mild overall nonpitting swelling evident medial ankles, seems dependent. Given significant pain, ?tendonitis of posterior tibialis bilaterally.  Less likely CVI vs amlodipine effect. Not consistent with her prior dx periph neuropathy. Will treat with voltaren gel (avoid oral NSAIDs 2/2 GI hx), decreasing amlodipine to 2.5mg  daily, and increase maxzide to 1 pill in am and 1/2 pill at night. If not improving as expected, I have asked her to return for further blood work. Pt agrees with plan.

## 2012-09-15 NOTE — Patient Instructions (Addendum)
Decrease amlodipine to 1/2 tablet daily (2.5mg  daily). Increase maxzide (triamteren hctz) to 1 pill in the morning and 1/2 at night. May use voltaren gel to inner ankles (sent to pharmacy).  Or try 1/2 flector patch onto inner ankles. If not better with this, return for labwork. Good to see you today, call us with questions.

## 2012-09-15 NOTE — Progress Notes (Signed)
  Subjective:    Patient ID: Cheyenne Pierce, female    DOB: April 24, 1934, 78 y.o.   MRN: [id number]  HPI CC: ankle swelling and pain  1 mo h/o bilateral ankle swelling and pain - progressively worsening.  Pain described as throbbing ache.  This is different from her periph neuropathy (which had improved after ESI). Painful to walk.  Elevating legs as much as able. No new rashes. No inciting injury. No fevers/chills, dyspnea, coughing, palpitations, chest pain.  Had some "angina" a few weeks ago.  Sees Dr. Kirke Corin.  Has had reassuring cardiac evaluation.  Using max freeze on legs which helps some.  On amlodipine 5mg  daily for years. On estrogen for 20-30 years. No h/o blood clots. No NSAID use recently (avoids 2/2 stomach issues of IBS, gastritis and GERD).  Past Medical History  Diagnosis Date  . Allergy   . Anxiety   . Hypertension   . Osteopenia   . IBS (irritable bowel syndrome)   . Hypothyroidism   . GERD (gastroesophageal reflux disease)   . Gastritis   . Peripheral neuropathy     in feet, ESI have helped Laban Emperor)  . DDD (degenerative disc disease), cervical     throughout spine    Past Surgical History  Procedure Laterality Date  . Appendectomy  1948  . Abdominal hysterectomy  1968    left ovary remains  . Ovarian cyst surgery  1963    cyst on ovaries  . Cardiac catheterization  09/26/1998  . Hernia repair    . Colostomy w/ rectocele repair  06/2004  . Ventral hernia repair  08/2008    abdominal wall, lysis of adhesions  . Colonoscopy  07/2009    [facility]), normal per pt  . Epidural steroid injections  multiple    help periph neuropathy     Review of Systems Per HPI    Objective:   Physical Exam  Nursing note and vitals reviewed. Constitutional: She appears well-developed and well-nourished. No distress.  HENT:  Head: Normocephalic and atraumatic.  Mouth/Throat: Oropharynx is clear and moist.  Neck: Normal range of motion. Neck supple. JVD (mild)  present. Carotid bruit is not present. No thyromegaly present.  Cardiovascular: Normal rate, regular rhythm and intact distal pulses.   Murmur (3/6 SEM best at LUSB) heard. Pulmonary/Chest: Effort normal and breath sounds normal. No respiratory distress. She has no wheezes. She has no rales.  Musculoskeletal: She exhibits edema (nonpitting).  Legs very tender to palpation, some swelling evident just superior to ankles. Very tender to light touch at medial malleoli.  Lymphadenopathy:    She has no cervical adenopathy.  Skin: Skin is warm and dry. No rash noted.  Psychiatric: She has a normal mood and affect.       Assessment & Plan:

## 2012-09-21 ENCOUNTER — Telehealth: Payer: Self-pay | Admitting: *Deleted

## 2012-09-21 DIAGNOSIS — G589 Mononeuropathy, unspecified: Secondary | ICD-10-CM

## 2012-09-21 DIAGNOSIS — E039 Hypothyroidism, unspecified: Secondary | ICD-10-CM

## 2012-09-21 DIAGNOSIS — I10 Essential (primary) hypertension: Secondary | ICD-10-CM

## 2012-09-21 DIAGNOSIS — R7303 Prediabetes: Secondary | ICD-10-CM | POA: Insufficient documentation

## 2012-09-21 NOTE — Addendum Note (Signed)
Addended by: Eustaquio Boyden on: 09/21/2012 10:23 PM   Modules accepted: Orders

## 2012-09-21 NOTE — Telephone Encounter (Signed)
Patient called and left message that her foot and leg have not improved and she was asking if you wanted her to come in for labs.

## 2012-09-21 NOTE — Telephone Encounter (Signed)
Yes plz have her come in for blood work., doesn't need to be fasting.

## 2012-09-22 ENCOUNTER — Other Ambulatory Visit (INDEPENDENT_AMBULATORY_CARE_PROVIDER_SITE_OTHER): Payer: Medicare Other

## 2012-09-22 DIAGNOSIS — E039 Hypothyroidism, unspecified: Secondary | ICD-10-CM

## 2012-09-22 DIAGNOSIS — R7309 Other abnormal glucose: Secondary | ICD-10-CM

## 2012-09-22 DIAGNOSIS — R7303 Prediabetes: Secondary | ICD-10-CM

## 2012-09-22 DIAGNOSIS — I10 Essential (primary) hypertension: Secondary | ICD-10-CM

## 2012-09-22 DIAGNOSIS — G589 Mononeuropathy, unspecified: Secondary | ICD-10-CM

## 2012-09-22 LAB — VITAMIN B12: Vitamin B-12: >1500 pg/mL — ABNORMAL HIGH (ref 211–911)

## 2012-09-22 LAB — COMPREHENSIVE METABOLIC PANEL
Albumin: 4.2 g/dL (ref 3.5–5.2)
Alkaline Phosphatase: 40 U/L (ref 39–117)
BUN: 7 mg/dL (ref 6–23)
CO2: 28 mEq/L (ref 19–32)
Calcium: 8.8 mg/dL (ref 8.4–10.5)
GFR: 79.81 mL/min (ref >60.00)
Glucose, Bld: 140 mg/dL — ABNORMAL HIGH (ref 70–99)
Potassium: 3.1 mEq/L — ABNORMAL LOW (ref 3.5–5.1)
Total Protein: 7.3 g/dL (ref 6.0–8.3)

## 2012-09-22 LAB — TSH: TSH: 0.77 u[IU]/mL (ref 0.35–5.50)

## 2012-09-22 LAB — MICROALBUMIN / CREATININE URINE RATIO: Microalb, Ur: 0.2 mg/dL (ref 0.0–1.9)

## 2012-09-22 NOTE — Telephone Encounter (Signed)
Appt scheduled

## 2012-09-23 ENCOUNTER — Other Ambulatory Visit: Payer: Self-pay | Admitting: Family Medicine

## 2012-09-23 MED ORDER — POTASSIUM CHLORIDE CRYS ER 20 MEQ PO TBCR: 20.0000 meq | EXTENDED_RELEASE_TABLET | Freq: Every day | ORAL | Status: DC

## 2012-09-23 MED ORDER — VITAMIN B-12 500 MCG PO TABS: 500.0000 ug | ORAL_TABLET | Freq: Every day | ORAL | Status: DC

## 2012-09-27 ENCOUNTER — Telehealth: Payer: Self-pay

## 2012-09-27 NOTE — Telephone Encounter (Signed)
Pt left v/m pt recently started potassium supplement and wants to know how long pt will be taking Klor Con. Pt also request copy of labs sent to [address] [facility]; done.

## 2012-09-28 NOTE — Telephone Encounter (Signed)
One of her blood pressure meds can cause low potassium - so i recommend taking regularly.

## 2012-09-28 NOTE — Telephone Encounter (Signed)
Patient notified. She was asking if possible if she could have an xray of her ankle. She said it still swollen since her visit here. She has been keeping it elevated when able and drinking plenty of water. She said it is only slightly painful but she feels certain something is going on. She is going to [place] next week for vacation and would like to know her ankle is okay before she does a lot of walking on it.

## 2012-09-29 NOTE — Telephone Encounter (Signed)
I don't think she needs xray as not consistent with fracture. plz ensure she's using voltaren gel up to TID and elevate legs when resting.

## 2012-10-01 NOTE — Telephone Encounter (Signed)
voltaren gel and flector patch are the same ingredients.  She could try voltaren gel as it may work better, or just continue with patch.

## 2012-10-01 NOTE — Telephone Encounter (Signed)
Patient has been using Flector patches. She read warnings/side effects of Voltaren gel and was afraid to try it. Her ankle is still swollen, but seems to swell more after walking/activity. She will continue to elevate them as possible. I advised I would call her back if there was anything else you wanted her to try.

## 2012-11-04 ENCOUNTER — Other Ambulatory Visit: Payer: Self-pay

## 2012-11-04 MED ORDER — SUCRALFATE 1 G PO TABS: 1.0000 g | ORAL_TABLET | Freq: Four times a day (QID) | ORAL | Status: DC

## 2012-11-04 MED ORDER — HYOSCYAMINE SULFATE 0.125 MG PO TABS: 0.1250 mg | ORAL_TABLET | ORAL | Status: DC | PRN

## 2012-11-04 MED ORDER — POTASSIUM CHLORIDE 20 MEQ/15ML (10%) PO LIQD: 20.0000 meq | Freq: Every day | ORAL | Status: DC

## 2012-11-04 MED ORDER — SUCRALFATE 1 G PO TABS: 1.0000 g | ORAL_TABLET | Freq: Three times a day (TID) | ORAL | Status: DC

## 2012-11-04 NOTE — Telephone Encounter (Signed)
Pt left v/m; pt having stomach pain after taking potassium, pain level is 10; pt taking potassium after eats lunch; pt said pain similar to when she used to have ulcers in her stomach. Pt is not seeing any blood in BM or vomiting blood. Pt said she is stopping potassium until hears back from Dr Reece Agar what other options pt has. Pt request refill hyoscyamine [facility].Please advise. Pt request cb.

## 2012-11-04 NOTE — Telephone Encounter (Signed)
Agree with stopping potassium tablets.  May try potassium liquid (sent into pharmacy). i've refilled levsin. Also recommend taking sucralfate tablets three times daily with food if tolerated to help with possible ulcer. To come in for office visit if not improving with this.

## 2012-11-05 NOTE — Telephone Encounter (Signed)
Message left for patient to return my call.  

## 2012-11-05 NOTE — Telephone Encounter (Signed)
Patient notified

## 2012-11-08 ENCOUNTER — Emergency Department: Payer: Self-pay | Admitting: Emergency Medicine

## 2012-11-08 LAB — URINALYSIS, COMPLETE
Glucose,UR: NEGATIVE mg/dL (ref 0–75)
Ketone: NEGATIVE
Leukocyte Esterase: NEGATIVE
Nitrite: NEGATIVE
Protein: NEGATIVE
RBC,UR: 1 /HPF (ref 0–5)
Squamous Epithelial: 1

## 2012-11-08 LAB — PROTIME-INR: INR: 1

## 2012-11-08 LAB — COMPREHENSIVE METABOLIC PANEL
Albumin: 3.7 g/dL (ref 3.4–5.0)
BUN: 10 mg/dL (ref 7–18)
Bilirubin,Total: 0.3 mg/dL (ref 0.2–1.0)
Calcium, Total: 9.1 mg/dL (ref 8.5–10.1)
EGFR (Non-African Amer.): >60
Osmolality: 265 (ref 275–301)
Potassium: 3.4 mmol/L — ABNORMAL LOW (ref 3.5–5.1)
SGPT (ALT): 26 U/L (ref 12–78)
Sodium: 132 mmol/L — ABNORMAL LOW (ref 136–145)

## 2012-11-08 LAB — SEDIMENTATION RATE: Erythrocyte Sed Rate: 9 mm/hr (ref 0–30)

## 2012-11-08 LAB — CBC
HCT: 37.2 % (ref 35.0–47.0)
MCH: 31.2 pg (ref 26.0–34.0)
RBC: 4.27 10*6/uL (ref 3.80–5.20)

## 2012-11-08 LAB — APTT: Activated PTT: 32.6 secs (ref 23.6–35.9)

## 2012-11-15 ENCOUNTER — Encounter: Payer: Self-pay | Admitting: Family Medicine

## 2012-11-15 ENCOUNTER — Ambulatory Visit (INDEPENDENT_AMBULATORY_CARE_PROVIDER_SITE_OTHER): Payer: Medicare Other | Admitting: Family Medicine

## 2012-11-15 VITALS — BP 140/80 | HR 80 | Temp 98.3°F | Wt 137.0 lb

## 2012-11-15 DIAGNOSIS — R51 Headache: Secondary | ICD-10-CM

## 2012-11-15 DIAGNOSIS — R519 Headache, unspecified: Secondary | ICD-10-CM | POA: Insufficient documentation

## 2012-11-15 MED ORDER — CYCLOBENZAPRINE HCL 5 MG PO TABS: 5.0000 mg | ORAL_TABLET | Freq: Two times a day (BID) | ORAL | Status: DC | PRN

## 2012-11-15 NOTE — Patient Instructions (Signed)
Trial of muscle relaxant (prescribed today) and use warm compresses to neck and posterior head. If not better with this, let me know for referral to neurologist for further evaluation. I will await records from ER. Let's keep an eye on drooping of left eyebrow.

## 2012-11-15 NOTE — Assessment & Plan Note (Signed)
Right sided. ?occipital neuralgia. Already on gabapentin 600mg  tid. Treat with flexeril 5mg  bid prn as well as heating pad/warm compresses to right posterior neck muscles. Update if not improving with this for referral to neurologist. Pt agrees with plan. Will monitor L eye droop for now, unclear duration.  Await CT scan obtained at ER.  Has ophthalmologist.

## 2012-11-15 NOTE — Progress Notes (Addendum)
Subjective:    Patient ID: Cheyenne Pierce, female    DOB: 1934/11/20, 78 y.o.   MRN: [id number]  HPI CC: ER f/u for HA  Cheyenne Pierce presents today as ER follow up after evaluation Monday last week for headache described as sharp shooting pain from neck into head like electrical sensation.  Continued pain.  Pain starts in posterior right neck, travels up throughout head.  Severe pain associated with dizziness and some nausea.  Denies fevers/chills, hearing changes, no recent viral URI.  No facial pain, or facial weakness.  No slurred speech, no numbness/weakness, no confusion.  Intermittent sharp pains come on about 3-4 x/day  Scan of neck done - told normal.  Given carbamazepine and percocets - didn't fill either.  Currently taking tylenol prn pain and meclizine prn nausea/dizziness.  Head pain comes too quickly for pain meds to be effective.  This feels different from her typical migraines.  I have requested records from ER to review.  Known cervical DDD.  Sees eye doctor for h/o L eye tearing and itching - told had allergies.   BP Readings from Last 3 Encounters:  11/15/12 140/80  09/15/12 136/82  05/13/12 128/68   Wt Readings from Last 3 Encounters:  11/15/12 137 lb (62.143 kg)  09/15/12 142 lb 4 oz (64.524 kg)  05/13/12 145 lb 12 oz (66.112 kg)  trying to lose weight - avoiding sugars.  Past Medical History  Diagnosis Date  . Allergy   . Anxiety   . Hypertension   . Osteopenia   . IBS (irritable bowel syndrome)   . Hypothyroidism   . GERD (gastroesophageal reflux disease)   . Gastritis   . Peripheral neuropathy     in feet, ESI have helped Laban Emperor)  . DDD (degenerative disc disease), cervical     throughout spine    Past Surgical History  Procedure Laterality Date  . Appendectomy  1948  . Abdominal hysterectomy  1968    left ovary remains  . Ovarian cyst surgery  1963    cyst on ovaries  . Cardiac catheterization  09/26/1998  . Hernia repair    . Colostomy  w/ rectocele repair  06/2004  . Ventral hernia repair  08/2008    abdominal wall, lysis of adhesions  . Colonoscopy  07/2009    [facility]), normal per pt  . Epidural steroid injections  multiple    help periph neuropathy  . US echocardiography  2009    mild aortic/mitral insuff, ER 55%, mild diastolic dysfunction  . Nm myoview ltd  2009    WNL per report    Review of Systems Per HPI    Objective:   Physical Exam  Nursing note and vitals reviewed. Constitutional: She is oriented to person, place, and time. She appears well-developed and well-nourished. No distress.  HENT:  Head: Normocephalic and atraumatic.  Right Ear: Hearing, tympanic membrane, external ear and ear canal normal.  Left Ear: Hearing, tympanic membrane, external ear and ear canal normal.  Nose: No mucosal edema or rhinorrhea. Right sinus exhibits no maxillary sinus tenderness and no frontal sinus tenderness. Left sinus exhibits no maxillary sinus tenderness and no frontal sinus tenderness.  Mouth/Throat: Uvula is midline, oropharynx is clear and moist and mucous membranes are normal. No oropharyngeal exudate, posterior oropharyngeal edema, posterior oropharyngeal erythema or tonsillar abscesses.  Eyes: Conjunctivae and EOM are normal. Pupils are equal, round, and reactive to light. No scleral icterus.  Neck: Normal range of motion. Neck supple. Carotid  bruit is not present.  Cardiovascular: Normal rate, regular rhythm, normal heart sounds and intact distal pulses.   No murmur heard. Pulmonary/Chest: Effort normal and breath sounds normal. No respiratory distress. She has no wheezes. She has no rales.  Musculoskeletal: She exhibits no edema.  FROM of neck Mild cervical midline spine tenderness around C6/7 R cervical muscle tenderness noted as well as tender to palpation of R occipital bone.  Lymphadenopathy:    She has no cervical adenopathy.  Neurological: She is alert and oriented to person, place, and time.  No cranial nerve deficit or sensory deficit.  L palpebral droop at rest noted - unsure duration. Able to lift bilateral eyebrows symmetrically  Skin: Skin is warm and dry. No rash noted.       Assessment & Plan:  ADDENDUM ==> Received, reviewed records from [facility] ER EKG stable ESR 9 TnI <0.02 CMP and CBC WNL, x Na 132, K 3.4 CT angio neck - no carotid stenosis.  + cervical spondylosis CT head no contrast - no acute finding

## 2012-11-22 ENCOUNTER — Telehealth: Payer: Self-pay

## 2012-11-22 NOTE — Telephone Encounter (Signed)
Prior auth required for cyclobenzaprine. Form in Dr Gutierrez's in box.

## 2012-11-23 NOTE — Telephone Encounter (Signed)
Filled and placed in my out box. 

## 2012-11-25 NOTE — Telephone Encounter (Signed)
Cheyenne Pierce with [facility] [employer] left v/m; cyclobenzaprine approved 11/23/12 thru 11/23/13. [facility] notified and med went thru. Approval letter to follow.

## 2012-12-08 NOTE — Telephone Encounter (Signed)
Approval letter received and sent for scanning.

## 2012-12-21 ENCOUNTER — Ambulatory Visit: Payer: Medicare Other

## 2012-12-29 LAB — HM PAP SMEAR: HM Pap smear: NEGATIVE

## 2012-12-30 ENCOUNTER — Ambulatory Visit (INDEPENDENT_AMBULATORY_CARE_PROVIDER_SITE_OTHER): Payer: Medicare Other

## 2012-12-30 DIAGNOSIS — Z23 Encounter for immunization: Secondary | ICD-10-CM

## 2013-01-03 ENCOUNTER — Ambulatory Visit: Payer: Self-pay | Admitting: Pain Medicine

## 2013-01-03 ENCOUNTER — Other Ambulatory Visit: Payer: Self-pay | Admitting: Internal Medicine

## 2013-01-04 ENCOUNTER — Ambulatory Visit: Payer: Self-pay | Admitting: Pain Medicine

## 2013-01-04 ENCOUNTER — Other Ambulatory Visit: Payer: Self-pay | Admitting: Family Medicine

## 2013-01-04 LAB — SEDIMENTATION RATE: Erythrocyte Sed Rate: 12 mm/hr (ref 0–30)

## 2013-01-04 LAB — C-REACTIVE PROTEIN: CRP: 2 mg/dL

## 2013-01-04 LAB — ERYTHROCYTE SEDIMENTATION RATE: SED RATE: 12 mm

## 2013-01-11 ENCOUNTER — Encounter: Payer: Self-pay | Admitting: Family Medicine

## 2013-01-19 ENCOUNTER — Other Ambulatory Visit: Payer: Self-pay | Admitting: Family Medicine

## 2013-01-20 ENCOUNTER — Ambulatory Visit: Payer: Self-pay | Admitting: Pain Medicine

## 2013-02-07 ENCOUNTER — Ambulatory Visit: Payer: Self-pay | Admitting: Pain Medicine

## 2013-02-28 ENCOUNTER — Ambulatory Visit: Payer: Self-pay | Admitting: Pain Medicine

## 2013-03-09 ENCOUNTER — Other Ambulatory Visit: Payer: Self-pay | Admitting: Family Medicine

## 2013-03-24 ENCOUNTER — Ambulatory Visit: Payer: Self-pay | Admitting: Pain Medicine

## 2013-04-08 ENCOUNTER — Other Ambulatory Visit: Payer: Self-pay

## 2013-04-08 MED ORDER — AMLODIPINE BESYLATE 5 MG PO TABS: 2.5000 mg | ORAL_TABLET | Freq: Every day | ORAL | Status: DC

## 2013-04-08 NOTE — Telephone Encounter (Signed)
Requested Prescriptions   Signed Prescriptions Disp Refills  . amLODipine (NORVASC) 5 MG tablet 15 tablet 3    Sig: Take 0.5 tablets (2.5 mg total) by mouth daily.    Authorizing Provider: Kathlyn Sacramento A    Ordering User: Britt Bottom

## 2013-04-14 ENCOUNTER — Ambulatory Visit (INDEPENDENT_AMBULATORY_CARE_PROVIDER_SITE_OTHER): Payer: Medicare Other | Admitting: Family Medicine

## 2013-04-14 ENCOUNTER — Telehealth: Payer: Self-pay

## 2013-04-14 ENCOUNTER — Encounter: Payer: Self-pay | Admitting: Family Medicine

## 2013-04-14 VITALS — BP 134/82 | HR 72 | Temp 97.8°F | Wt 128.5 lb

## 2013-04-14 DIAGNOSIS — R4586 Emotional lability: Secondary | ICD-10-CM

## 2013-04-14 DIAGNOSIS — R42 Dizziness and giddiness: Secondary | ICD-10-CM

## 2013-04-14 DIAGNOSIS — F39 Unspecified mood [affective] disorder: Secondary | ICD-10-CM

## 2013-04-14 DIAGNOSIS — R519 Headache, unspecified: Secondary | ICD-10-CM

## 2013-04-14 DIAGNOSIS — R51 Headache: Secondary | ICD-10-CM

## 2013-04-14 LAB — CBC WITH DIFFERENTIAL/PLATELET
BASOS PCT: 0.5 % (ref 0.0–3.0)
Basophils Absolute: 0 10*3/uL (ref 0.0–0.1)
EOS ABS: 0.1 10*3/uL (ref 0.0–0.7)
EOS PCT: 1.2 % (ref 0.0–5.0)
HCT: 41.6 % (ref 36.0–46.0)
HEMOGLOBIN: 14.1 g/dL (ref 12.0–15.0)
LYMPHS PCT: 33.7 % (ref 12.0–46.0)
Lymphs Abs: 1.9 10*3/uL (ref 0.7–4.0)
MCHC: 34 g/dL (ref 30.0–36.0)
MCV: 90.7 fl (ref 78.0–100.0)
MONOS PCT: 6.5 % (ref 3.0–12.0)
Monocytes Absolute: 0.4 10*3/uL (ref 0.1–1.0)
NEUTROS ABS: 3.2 10*3/uL (ref 1.4–7.7)
NEUTROS PCT: 58.1 % (ref 43.0–77.0)
Platelets: 312 10*3/uL (ref 150.0–400.0)
RBC: 4.59 Mil/uL (ref 3.87–5.11)
RDW: 14.5 % (ref 11.5–14.6)
WBC: 5.6 10*3/uL (ref 4.5–10.5)

## 2013-04-14 LAB — BASIC METABOLIC PANEL
BUN: 14 mg/dL (ref 6–23)
CO2: 30 mEq/L (ref 19–32)
CREATININE: 0.8 mg/dL (ref 0.4–1.2)
Calcium: 9.6 mg/dL (ref 8.4–10.5)
Chloride: 101 mEq/L (ref 96–112)
GFR: 71.89 mL/min (ref >60.00)
Glucose, Bld: 92 mg/dL (ref 70–99)
Potassium: 3.7 mEq/L (ref 3.5–5.1)
SODIUM: 140 meq/L (ref 135–145)

## 2013-04-14 MED ORDER — TRIAMTERENE-HCTZ 37.5-25 MG PO TABS: 1.0000 | ORAL_TABLET | Freq: Every day | ORAL | Status: DC

## 2013-04-14 MED ORDER — AMLODIPINE BESYLATE 5 MG PO TABS: 5.0000 mg | ORAL_TABLET | Freq: Every day | ORAL | Status: DC

## 2013-04-14 MED ORDER — BUSPIRONE HCL 15 MG PO TABS: ORAL_TABLET | ORAL | Status: DC

## 2013-04-14 MED ORDER — AMLODIPINE BESYLATE 5 MG PO TABS
2.5000 mg | ORAL_TABLET | Freq: Every day | ORAL | Status: DC
Start: 2013-04-14 — End: 2013-04-14

## 2013-04-14 MED ORDER — FLUOXETINE HCL 20 MG PO TABS: 20.0000 mg | ORAL_TABLET | Freq: Every day | ORAL | Status: DC

## 2013-04-14 NOTE — Telephone Encounter (Signed)
Looks like this was changed 6/19 but no clear reason why.  If pt has been taking 5mg  whole pill daily, recommend continue this as she has had good control of bp with this. I have updated chart and sent in new dose to her pharmacy (5mg  daily).

## 2013-04-14 NOTE — Progress Notes (Signed)
Pre-visit discussion using our clinic review tool. No additional management support is needed unless otherwise documented below in the visit note.  

## 2013-04-14 NOTE — Progress Notes (Signed)
Subjective:    Patient ID: Cheyenne Pierce, female    DOB: 05-Jan-1935, 79 y.o.   MRN: [id number]  HPI CC: discuss mood swings and headache  Seen here last 11/2012 as ER f/u for occipital HA thought neuralgia - see prior note for details.  At that time, flexeril was added to regimen to treat possible muscle tightness component and pt was advise to notify me if persistent sxs for referral to neurologist.  Actually her occipital headaches have improved after a set of steroid shots into cervical neck by D. [name].    Now describes headache associated with dizziness.  Alleviated with meclizine and tylenol.  Denies chest pain or palpitations or dyspnea with dizzy episodes.  Dizziness described as lightheadedness.  Also notices worsening mood swings and trouble controlling anger over last 2 months.  Does endorse feelings of sadness and depression.  Endorses some anhedonia.  Does enjoy her kindle a gift from her children.  Denies trouble sleeping, no recent appetite changes, no trouble with energy but some trouble with concentration.  No SI/HI.  Has been on chronic buspar for h/o anxiety, takes 64m in am and 376mat bedtime.  Also on neurontin 60062mid per Dr. NavDossie Arbourr neuropathy.  Brings records from Dr. NavDossie Arbour C spine Xray (DDD and facet joint change of mid cervical spine s/p injection in neck 01/20/2013 and 03/29/2013 and ESI lumbar spine 03/29/2013), normal CRP (2.0), normal ESR 12  Wt Readings from Last 3 Encounters:  04/14/13 128 lb 8 oz (58.287 kg)  11/15/12 137 lb (62.143 kg)  09/15/12 142 lb 4 oz (64.524 kg)  has changed diet - avoids sugars and watching starches. Body mass index is 24.29 kg/(m^2).  Past Medical History  Diagnosis Date  . Allergy   . Anxiety   . Hypertension   . Osteopenia   . IBS (irritable bowel syndrome)   . Hypothyroidism   . GERD (gastroesophageal reflux disease)   . Gastritis   . Peripheral neuropathy     in feet, ESI have helped (NaDossie Arbour. DDD  (degenerative disc disease), cervical     throughout spine    Past Surgical History  Procedure Laterality Date  . Appendectomy  1948  . Abdominal hysterectomy  1968    left ovary remains  . Ovarian cyst surgery  1963    cyst on ovaries  . Cardiac catheterization  09/26/1998  . Hernia repair    . Colostomy w/ rectocele repair  06/2004  . Ventral hernia repair  08/2008    abdominal wall, lysis of adhesions  . [facility]  . Epidural steroid injections  multiple    help periph neuropathy  . [facility] [place]ocardiography  2009    mild aortic/mitral insuff, ER 55%41%ild diastolic dysfunction  . Nm myoview ltd  2009    WNL per report  . Dexa  2013    normal   Review of Systems Per HPI    Objective:   Physical Exam  Nursing note and vitals reviewed. Constitutional: She is oriented to person, place, and time. She appears well-developed and well-nourished. No distress.  HENT:  Mouth/Throat: Oropharynx is clear and moist. No oropharyngeal exudate.  Cardiovascular: Normal rate, regular rhythm, normal heart sounds and intact distal pulses.   No murmur heard. Pulmonary/Chest: Effort normal and breath sounds normal. No respiratory distress. She has no wheezes. She has no rales.  Musculoskeletal: She exhibits no edema.  Neurological:  She is alert and oriented to person, place, and time. No cranial nerve deficit. Coordination normal.  CN 2-12 intact except for chronic slight droop of L eyelid  Skin: Skin is warm and dry. No rash noted.       Assessment & Plan:

## 2013-04-14 NOTE — Patient Instructions (Signed)
Good to see you today. Return at your convenience for medicare wellness exam. I'd like to start you on a low dose of prozac 20mg  daily. Let's try lower dose of buspar at night time to see if we can help dizzy epsiodes. blood work today.

## 2013-04-14 NOTE — Telephone Encounter (Signed)
Pt left v/m; pt was seen today, pt said she is supposed to take amlodipine 5mg  one tab daily. Pt said when she went to [facility]. The amlodipine 5 mg  with instructions to take 1/2 tablet. Pt request cb. Pt thinks instructions should be take one tab daily.Please advise.

## 2013-04-15 ENCOUNTER — Encounter: Payer: Self-pay | Admitting: *Deleted

## 2013-04-15 ENCOUNTER — Encounter: Payer: Self-pay | Admitting: Family Medicine

## 2013-04-15 DIAGNOSIS — R4586 Emotional lability: Secondary | ICD-10-CM | POA: Insufficient documentation

## 2013-04-15 NOTE — Telephone Encounter (Signed)
Message left notifying patient.

## 2013-04-15 NOTE — Assessment & Plan Note (Signed)
nonfocal neurological exam. Doesn't describe vertigo or presyncope. Possibly med related - discussed possible etiologies of gabapentin and buspar. Will start with decrease in buspar to 15mg  bid. Check CBC, BMP to eval for other causes in setting of h/o hypokalemia in past. BP stable - continue meds. Doubt hypotension related.

## 2013-04-15 NOTE — Assessment & Plan Note (Signed)
Occipital headache has resolved.  Now describes more of a frontal headache associated with dizziness. See below.

## 2013-04-15 NOTE — Telephone Encounter (Signed)
Pt left v/m requesting update on BP med.

## 2013-04-15 NOTE — Assessment & Plan Note (Signed)
In h/o anxiety. Doesn't quite meet criteria for MDD. Start prozac 20mg  per pt preference.  Intolerance to zoloft in past. rtc next few months for AMW and f/u.  Pt agrees with plan. Discussed if any SI/HI to stop prozac and notify us immediately.

## 2013-04-20 ENCOUNTER — Encounter: Payer: Self-pay | Admitting: *Deleted

## 2013-04-28 ENCOUNTER — Telehealth: Payer: Self-pay | Admitting: Family Medicine

## 2013-04-28 MED ORDER — MECLIZINE HCL 25 MG PO TABS: 25.0000 mg | ORAL_TABLET | Freq: Three times a day (TID) | ORAL | Status: DC | PRN

## 2013-04-28 MED ORDER — HYOSCYAMINE SULFATE 0.125 MG PO TABS: 0.1250 mg | ORAL_TABLET | ORAL | Status: DC | PRN

## 2013-04-28 NOTE — Telephone Encounter (Signed)
Received refill request for hyoscyamine, meclizine, and amlodipine. Pt states cannot tolerate prozac 20mg  2/2 insomnia, night sweating and rapid heart beats. plz notify I've refilled above meds and removed prozac from her list.  We can discuss alternatives at next f/u visit.

## 2013-04-29 NOTE — Telephone Encounter (Signed)
Patient notified. She requested appt for possible memory issues. Appt scheduled.

## 2013-05-06 ENCOUNTER — Ambulatory Visit: Payer: Self-pay | Admitting: Pain Medicine

## 2013-05-13 ENCOUNTER — Ambulatory Visit (INDEPENDENT_AMBULATORY_CARE_PROVIDER_SITE_OTHER): Payer: Medicare Other | Admitting: Family Medicine

## 2013-05-13 ENCOUNTER — Encounter: Payer: Self-pay | Admitting: Family Medicine

## 2013-05-13 VITALS — BP 110/80 | HR 64 | Temp 98.2°F | Wt 129.5 lb

## 2013-05-13 DIAGNOSIS — R413 Other amnesia: Secondary | ICD-10-CM

## 2013-05-13 DIAGNOSIS — F411 Generalized anxiety disorder: Secondary | ICD-10-CM

## 2013-05-13 MED ORDER — BUPROPION HCL 75 MG PO TABS: 75.0000 mg | ORAL_TABLET | Freq: Two times a day (BID) | ORAL | Status: DC

## 2013-05-13 MED ORDER — TRIAMTERENE-HCTZ 37.5-25 MG PO TABS: 1.0000 | ORAL_TABLET | Freq: Every day | ORAL | Status: DC

## 2013-05-13 NOTE — Progress Notes (Signed)
BP 110/80  Pulse 64  Temp(Src) 98.2 F (36.8 C) (Oral)  Wt 129 lb 8 oz (58.741 kg)   CC: memory evaluation  Subjective:    Patient ID: Cheyenne Pierce, female    DOB: 24-Nov-1934, 79 y.o.   MRN: [id number]  HPI: Cheyenne Pierce is a 79 y.o. female presenting on 05/13/2013 with Memory Assessment  Dr. [name] recently added aspirin 81mg  nightly and added gabapentin 200mg  at night time to per prior regimen of gabapentin 600mg  tid. Pt states cannot tolerate prozac 20mg  2/2 insomnia, night sweating and rapid heart beats. Persistent anxiety on buspar 15mg  bid. Persistent intermittent headaches and dizziness but overall better.  She has had 2-3 year history of memory concerns.  She notices worsening nightmares and short term memory loss (such as forgetting that they've already blessed meals prior to eating).  She also notices than sometimes when talking on the phone she forgets specific words she wants to use.  Has been to a dementia conference and brings me dementia handout.  Worried about this.  Denies specific family history of memory troubles or dementia.  Mother did have hardening of arteries with very mild memory troubles - she lived to be 77 years old without significant issue.  Patient stays mentally active with [employer].  No significant fish intake.  Has not tolerated fish oil.  Geriatric Assessment: Mental Status Exam: 29/30(value/max value)    Clock Drawing Score: 4/4  Relevant past medical, surgical, family and social history reviewed and updated. Allergies and medications reviewed and updated. Current Outpatient Prescriptions on File Prior to Visit  Medication Sig  . acetaminophen (TYLENOL) 500 MG tablet Take 500 mg by mouth every 6 (six) hours as needed.  Marland Kitchen amLODipine (NORVASC) 5 MG tablet Take 1 tablet (5 mg total) by mouth daily.  . Biotin 1000 MCG tablet Take 1,000 mcg by mouth daily.  . Calcium Carbonate-Vitamin D (CALCIUM 600+D HIGH POTENCY) 600-400 MG-UNIT per  tablet Take 1 tablet by mouth daily.  . cholecalciferol (VITAMIN D) 1000 UNITS tablet Take 1,000 Units by mouth daily.  . diclofenac (FLECTOR) 1.3 % PTCH Place 1 patch onto the skin as needed.    . diphenhydrAMINE (BENADRYL) 25 mg capsule Take 25 mg by mouth at bedtime.   . fluticasone (FLONASE) 50 MCG/ACT nasal spray Place 2 sprays into the nose as needed.  . gabapentin (NEURONTIN) 300 MG capsule Take 600 mg by mouth 3 (three) times daily.   . hyoscyamine (LEVSIN, ANASPAZ) 0.125 MG tablet Take 1 tablet (0.125 mg total) by mouth as needed.  . Lactobacillus Rhamnosus, GG, (CULTURELLE PO) Take 1 capsule by mouth daily.  Marland Kitchen levothyroxine (SYNTHROID, LEVOTHROID) 75 MCG tablet TAKE 1 TABLET BY MOUTH EVERY DAY  . loperamide (IMODIUM) 2 MG capsule Take 2 mg by mouth as needed for diarrhea or loose stools.  . Magnesium 250 MG TABS Take by mouth daily.  . meclizine (ANTIVERT) 25 MG tablet Take 1 tablet (25 mg total) by mouth 3 (three) times daily as needed.  . Multiple Vitamins-Minerals (CENTRUM SILVER PO) Take by mouth.    . nitroGLYCERIN (NITROSTAT) 0.4 MG SL tablet Place 0.4 mg under the tongue every 5 (five) minutes as needed.  . pantoprazole (PROTONIX) 40 MG tablet TAKE 1 TABLET BY MOUTH ONCE A DAY  . Potassium Gluconate 595 MG CAPS Take 1 capsule by mouth daily.  . Tragacanth (ASTRAGALUS ROOT) POWD Take by mouth daily.    . vitamin B-12 (CYANOCOBALAMIN) 500 MCG tablet Take  1 tablet (500 mcg total) by mouth daily.   No current facility-administered medications on file prior to visit.    Review of Systems Per HPI unless specifically indicated above    Objective:    BP 110/80  Pulse 64  Temp(Src) 98.2 F (36.8 C) (Oral)  Wt 129 lb 8 oz (58.741 kg)  Physical Exam  Nursing note and vitals reviewed. Constitutional: She appears well-developed and well-nourished. No distress.  Psychiatric: She has a normal mood and affect. Her behavior is normal. Judgment and thought content normal.    Results for orders placed in visit on 04/20/13  C-REACTIVE PROTEIN      Result Value Range   CRP 2.0    ERYTHROCYTE SEDIMENTATION RATE       Result Value Range   Sed Rate 12        Assessment & Plan:   Problem List Items Addressed This Visit   ANXIETY     Will do trial of wellbutrin 75mg  daily for 2 wks then increase to bid.   Continue buspar, hopeful to slowly wean off this med (given dizziness/fall hx).    Relevant Medications      busPIRone (BUSPAR) 15 MG tablet      buPROPion (WELLBUTRIN) tablet   Memory deficit - Primary     Very mild by story, exam today WNL.  MMSE 29/30, clock drawing test normal. Reassured pt based on today's visit no evidence of significant memory impairment, and no fmhx of this. Discussed preventative measures including staying active, increasing fish intake, and continuing b12 and vit D. Pt agrees. Will continue to taper off estrogen - to 1/2 tab QOD.        Follow up plan: Return as needed, for annual wellness exam, prior fasting for blood work.

## 2013-05-13 NOTE — Progress Notes (Signed)
Pre-visit discussion using our clinic review tool. No additional management support is needed unless otherwise documented below in the visit note.  

## 2013-05-13 NOTE — Assessment & Plan Note (Signed)
Very mild by story, exam today WNL.  MMSE 29/30, clock drawing test normal. Reassured pt based on today's visit no evidence of significant memory impairment, and no fmhx of this. Discussed preventative measures including staying active, increasing fish intake, and continuing b12 and vit D. Pt agrees. Will continue to taper off estrogen - to 1/2 tab QOD.

## 2013-05-13 NOTE — Patient Instructions (Addendum)
Memory testing ok today. Continue words with friends. Let's continue trying to back off estrogen 1/2 tablet every other day Healthy fish handout provided today. Let's try wellbutrin 75mg  once a day for 1-2 weeks. If tolerated well, may increase to twice daily.  If we do well with this, may slowly come off buspar.

## 2013-05-13 NOTE — Assessment & Plan Note (Signed)
Will do trial of wellbutrin 75mg  daily for 2 wks then increase to bid.   Continue buspar, hopeful to slowly wean off this med (given dizziness/fall hx).

## 2013-06-14 ENCOUNTER — Telehealth: Payer: Self-pay | Admitting: *Deleted

## 2013-06-14 NOTE — Telephone Encounter (Signed)
Patient called and left message wanting to let you know that she is completely off of the estradiol and buspar now. She is only on the wellbutrin and it is working very well. She said her mood is great and she feels wonderful. She wanted to thank you for helping her.

## 2013-06-14 NOTE — Telephone Encounter (Signed)
Noted. Wonderful, thanks.

## 2013-07-28 LAB — HM MAMMOGRAPHY

## 2013-07-31 ENCOUNTER — Other Ambulatory Visit: Payer: Self-pay | Admitting: Family Medicine

## 2013-08-17 ENCOUNTER — Encounter: Payer: Self-pay | Admitting: Family Medicine

## 2013-08-23 ENCOUNTER — Encounter: Payer: Self-pay | Admitting: Family Medicine

## 2013-09-16 ENCOUNTER — Ambulatory Visit (INDEPENDENT_AMBULATORY_CARE_PROVIDER_SITE_OTHER): Payer: Medicare Other | Admitting: Family Medicine

## 2013-09-16 ENCOUNTER — Encounter: Payer: Self-pay | Admitting: Family Medicine

## 2013-09-16 VITALS — BP 144/94 | HR 64 | Temp 98.2°F | Wt 131.2 lb

## 2013-09-16 DIAGNOSIS — F5089 Other specified eating disorder: Secondary | ICD-10-CM

## 2013-09-16 DIAGNOSIS — L659 Nonscarring hair loss, unspecified: Secondary | ICD-10-CM

## 2013-09-16 LAB — IBC PANEL
IRON: 113 ug/dL (ref 42–145)
Saturation Ratios: 29.1 % (ref 20.0–50.0)
TRANSFERRIN: 277.8 mg/dL (ref 212.0–360.0)

## 2013-09-16 LAB — T4, FREE: Free T4: 1.23 ng/dL (ref 0.60–1.60)

## 2013-09-16 LAB — FERRITIN: FERRITIN: 28.6 ng/mL (ref 10.0–291.0)

## 2013-09-16 LAB — TSH: TSH: 0.35 u[IU]/mL (ref 0.35–4.50)

## 2013-09-16 NOTE — Assessment & Plan Note (Signed)
Not consistent with alopecia areata nor with telogen effluvium. Check TSH, and also iron panel with newly endorsed pica sxs. Will update pt with results Continue biotin.

## 2013-09-16 NOTE — Patient Instructions (Signed)
Blood work today - we will call you with results.

## 2013-09-16 NOTE — Progress Notes (Signed)
Pre visit review using our clinic review tool, if applicable. No additional management support is needed unless otherwise documented below in the visit note. 

## 2013-09-16 NOTE — Progress Notes (Signed)
BP 144/94  Pulse 64  Temp(Src) 98.2 F (36.8 C) (Oral)  Wt 131 lb 4 oz (59.535 kg)   CC: hair loss  Subjective:    Patient ID: Cheyenne Pierce, female    DOB: 1934-06-23, 79 y.o.   MRN: [id number]  HPI: Cheyenne Pierce is a 79 y.o. female presenting on 09/16/2013 for Alopecia   Over last few weeks noticing rapid progression of hair loss, describes hair loss all over scalp. Saw beautician yesterday without cause found - no new chemical processes.  Denies new stressor, new hair products or shampoo She has been taking biotin daily which helps nails but hasn't helped hair.  No abd pain, skin changes, nausea/vomiting or diarrhea/constipation.  Over last 6 months has been drinking plenty of water and also endorses PICA - chewing ice throughout the day. + cold intolerance and hot flashes.  Wt Readings from Last 3 Encounters:  09/16/13 131 lb 4 oz (59.535 kg)  05/13/13 129 lb 8 oz (58.741 kg)  04/14/13 128 lb 8 oz (58.287 kg)    Lab Results  Component Value Date   TSH 0.77 09/22/2012   Lab Results  Component Value Date   WBC 5.6 04/14/2013   HGB 14.1 04/14/2013   HCT 41.6 04/14/2013   MCV 90.7 04/14/2013   PLT 312.0 04/14/2013    Relevant past medical, surgical, family and social history reviewed and updated as indicated.  Allergies and medications reviewed and updated. Current Outpatient Prescriptions on File Prior to Visit  Medication Sig  . acetaminophen (TYLENOL) 500 MG tablet Take 500 mg by mouth every 6 (six) hours as needed.  Marland Kitchen amLODipine (NORVASC) 5 MG tablet Take 1 tablet (5 mg total) by mouth daily.  . Biotin 1000 MCG tablet Take 1,000 mcg by mouth daily.  . Calcium Carbonate-Vitamin D (CALCIUM 600+D HIGH POTENCY) 600-400 MG-UNIT per tablet Take 1 tablet by mouth daily.  . cholecalciferol (VITAMIN D) 1000 UNITS tablet Take 1,000 Units by mouth daily.  . diclofenac (FLECTOR) 1.3 % PTCH Place 1 patch onto the skin as needed.    . diphenhydrAMINE (BENADRYL) 25 mg capsule  Take 25 mg by mouth at bedtime.   . fluticasone (FLONASE) 50 MCG/ACT nasal spray Place 2 sprays into the nose as needed.  . gabapentin (NEURONTIN) 100 MG capsule Take 200 mg by mouth at bedtime.  . gabapentin (NEURONTIN) 300 MG capsule Take 600 mg by mouth 3 (three) times daily.   . hyoscyamine (LEVSIN, ANASPAZ) 0.125 MG tablet Take 1 tablet (0.125 mg total) by mouth as needed.  . Lactobacillus Rhamnosus, GG, (CULTURELLE PO) Take 1 capsule by mouth daily.  Marland Kitchen levothyroxine (SYNTHROID, LEVOTHROID) 75 MCG tablet TAKE 1 TABLET BY MOUTH EVERY DAY  . loperamide (IMODIUM) 2 MG capsule Take 2 mg by mouth as needed for diarrhea or loose stools.  . Magnesium 250 MG TABS Take by mouth daily.  . meclizine (ANTIVERT) 25 MG tablet Take 1 tablet (25 mg total) by mouth 3 (three) times daily as needed.  . Multiple Vitamins-Minerals (CENTRUM SILVER PO) Take by mouth.    . nitroGLYCERIN (NITROSTAT) 0.4 MG SL tablet Place 0.4 mg under the tongue every 5 (five) minutes as needed.  . pantoprazole (PROTONIX) 40 MG tablet TAKE 1 TABLET BY MOUTH ONCE A DAY  . Potassium Gluconate 595 MG CAPS Take 1 capsule by mouth daily.  . Tragacanth (ASTRAGALUS ROOT) POWD Take by mouth daily.    Marland Kitchen triamterene-hydrochlorothiazide (MAXZIDE-25) 37.5-25 MG per tablet Take 1 tablet  by mouth daily.  . vitamin B-12 (CYANOCOBALAMIN) 500 MCG tablet Take 1 tablet (500 mcg total) by mouth daily.  Marland Kitchen aspirin 81 MG tablet Take 81 mg by mouth daily.   No current facility-administered medications on file prior to visit.    Review of Systems Per HPI unless specifically indicated above    Objective:    BP 144/94  Pulse 64  Temp(Src) 98.2 F (36.8 C) (Oral)  Wt 131 lb 4 oz (59.535 kg)  Physical Exam  Nursing note and vitals reviewed. Constitutional: She appears well-developed and well-nourished. No distress.  Neck: No thyromegaly present.  Skin: Skin is warm and dry. No rash noted.  Scalp seems healthy, no erythema. Neg pull test (1-2  hairs each attempt)   Results for orders placed in visit on 07/31/13  HM MAMMOGRAPHY      Result Value Ref Range   HM Mammogram birads 1        Assessment & Plan:   Problem List Items Addressed This Visit   Hair thinning - Primary     Not consistent with alopecia areata nor with telogen effluvium. Check TSH, and also iron panel with newly endorsed pica sxs. Will update pt with results Continue biotin.    Relevant Orders      IBC panel      TSH      T4, Free    Other Visit Diagnoses   Pica in adults        Relevant Orders       Ferritin       IBC panel        Follow up plan: Return if symptoms worsen or fail to improve.

## 2013-09-17 ENCOUNTER — Other Ambulatory Visit: Payer: Self-pay | Admitting: Family Medicine

## 2013-10-02 ENCOUNTER — Other Ambulatory Visit: Payer: Self-pay | Admitting: Family Medicine

## 2013-10-02 DIAGNOSIS — E785 Hyperlipidemia, unspecified: Secondary | ICD-10-CM

## 2013-10-02 DIAGNOSIS — R7303 Prediabetes: Secondary | ICD-10-CM

## 2013-10-02 DIAGNOSIS — M949 Disorder of cartilage, unspecified: Secondary | ICD-10-CM

## 2013-10-02 DIAGNOSIS — I10 Essential (primary) hypertension: Secondary | ICD-10-CM

## 2013-10-02 DIAGNOSIS — M899 Disorder of bone, unspecified: Secondary | ICD-10-CM

## 2013-10-02 DIAGNOSIS — E039 Hypothyroidism, unspecified: Secondary | ICD-10-CM

## 2013-10-04 ENCOUNTER — Other Ambulatory Visit (INDEPENDENT_AMBULATORY_CARE_PROVIDER_SITE_OTHER): Payer: Medicare Other

## 2013-10-04 DIAGNOSIS — R7303 Prediabetes: Secondary | ICD-10-CM

## 2013-10-04 DIAGNOSIS — M899 Disorder of bone, unspecified: Secondary | ICD-10-CM

## 2013-10-04 DIAGNOSIS — E785 Hyperlipidemia, unspecified: Secondary | ICD-10-CM

## 2013-10-04 DIAGNOSIS — R7309 Other abnormal glucose: Secondary | ICD-10-CM

## 2013-10-04 DIAGNOSIS — E039 Hypothyroidism, unspecified: Secondary | ICD-10-CM

## 2013-10-04 DIAGNOSIS — I10 Essential (primary) hypertension: Secondary | ICD-10-CM

## 2013-10-04 DIAGNOSIS — M949 Disorder of cartilage, unspecified: Secondary | ICD-10-CM

## 2013-10-04 LAB — BASIC METABOLIC PANEL
BUN: 10 mg/dL (ref 6–23)
CHLORIDE: 100 meq/L (ref 96–112)
CO2: 29 mEq/L (ref 19–32)
Calcium: 9.6 mg/dL (ref 8.4–10.5)
Creatinine, Ser: 0.8 mg/dL (ref 0.4–1.2)
GFR: 77.21 mL/min (ref >60.00)
Glucose, Bld: 116 mg/dL — ABNORMAL HIGH (ref 70–99)
POTASSIUM: 3.9 meq/L (ref 3.5–5.1)
Sodium: 137 mEq/L (ref 135–145)

## 2013-10-04 LAB — HEMOGLOBIN A1C: HEMOGLOBIN A1C: 6.1 % (ref 4.6–6.5)

## 2013-10-04 LAB — LIPID PANEL
Cholesterol: 224 mg/dL — ABNORMAL HIGH (ref 0–200)
HDL: 57.7 mg/dL (ref >39.00)
LDL Cholesterol: 129 mg/dL — ABNORMAL HIGH (ref 0–99)
NONHDL: 166.3
Total CHOL/HDL Ratio: 4
Triglycerides: 185 mg/dL — ABNORMAL HIGH (ref 0.0–149.0)
VLDL: 37 mg/dL (ref 0.0–40.0)

## 2013-10-04 LAB — TSH: TSH: 0.62 u[IU]/mL (ref 0.35–4.50)

## 2013-10-04 LAB — VITAMIN D 25 HYDROXY (VIT D DEFICIENCY, FRACTURES): VITD: 47.39 ng/mL

## 2013-10-11 ENCOUNTER — Encounter: Payer: Self-pay | Admitting: Family Medicine

## 2013-10-11 ENCOUNTER — Ambulatory Visit (INDEPENDENT_AMBULATORY_CARE_PROVIDER_SITE_OTHER): Payer: Medicare Other | Admitting: Family Medicine

## 2013-10-11 ENCOUNTER — Telehealth: Payer: Self-pay | Admitting: Family Medicine

## 2013-10-11 VITALS — BP 118/82 | HR 71 | Temp 97.8°F | Ht 59.0 in | Wt 131.0 lb

## 2013-10-11 DIAGNOSIS — I10 Essential (primary) hypertension: Secondary | ICD-10-CM

## 2013-10-11 DIAGNOSIS — K589 Irritable bowel syndrome without diarrhea: Secondary | ICD-10-CM

## 2013-10-11 DIAGNOSIS — Z23 Encounter for immunization: Secondary | ICD-10-CM

## 2013-10-11 DIAGNOSIS — R7309 Other abnormal glucose: Secondary | ICD-10-CM

## 2013-10-11 DIAGNOSIS — R7303 Prediabetes: Secondary | ICD-10-CM

## 2013-10-11 DIAGNOSIS — Z0000 Encounter for general adult medical examination without abnormal findings: Secondary | ICD-10-CM

## 2013-10-11 DIAGNOSIS — R011 Cardiac murmur, unspecified: Secondary | ICD-10-CM

## 2013-10-11 DIAGNOSIS — R413 Other amnesia: Secondary | ICD-10-CM

## 2013-10-11 DIAGNOSIS — F411 Generalized anxiety disorder: Secondary | ICD-10-CM

## 2013-10-11 DIAGNOSIS — Z7189 Other specified counseling: Secondary | ICD-10-CM | POA: Insufficient documentation

## 2013-10-11 DIAGNOSIS — E039 Hypothyroidism, unspecified: Secondary | ICD-10-CM

## 2013-10-11 NOTE — Assessment & Plan Note (Signed)
Chronic, stable. Continue levothyroxine dose.  

## 2013-10-11 NOTE — Assessment & Plan Note (Signed)
Ongoing issue - continue levsin prn.

## 2013-10-11 NOTE — Assessment & Plan Note (Signed)
Chronic, stable. Continue HTN regimen.

## 2013-10-11 NOTE — Assessment & Plan Note (Signed)
Deteriorated despite wellbutrin 75mg  daily - suggested she try 75mg  bid dose. She will call me with update in next 2-3 weeks. If not tolerated, consider adjuvant antidepressant.

## 2013-10-11 NOTE — Progress Notes (Signed)
Pre visit review using our clinic review tool, if applicable. No additional management support is needed unless otherwise documented below in the visit note. 

## 2013-10-11 NOTE — Addendum Note (Signed)
Addended by: Lurlean Nanny on: 10/11/2013 04:09 PM   Modules accepted: Orders

## 2013-10-11 NOTE — Assessment & Plan Note (Signed)
Reviewed A1c with patient, longterm stability. Encouraged continued monitoring of diet and decreasing added sugars and sweets.

## 2013-10-11 NOTE — Telephone Encounter (Signed)
Relevant patient education mailed to patient.  

## 2013-10-11 NOTE — Patient Instructions (Addendum)
Bring me a copy of bone density scan. prevnar today. Let's increase wellbutrin again to twice a day for next few weeks and update me if not effective or if not tolerated Good to see you today, call us with quesitons.

## 2013-10-11 NOTE — Progress Notes (Signed)
BP 118/82  Pulse 71  Temp(Src) 97.8 F (36.6 C) (Oral)  Ht 4\' 11"  (1.499 m)  Wt 131 lb (59.421 kg)  BMI 26.44 kg/m2  SpO2 97%   CC: medicare wellness  Subjective:    Patient ID: Cheyenne Pierce, female    DOB: [birth year], 79 y.o.   MRN: [id number]  HPI: KASSIA DEMARINIS is a 79 y.o. female presenting on 10/11/2013 for Medicare Wellness   Aspirin caused nose bleeds - so stopped this medication.  Hearing and vision screen passed today. Gets yearly eye exams - 12/2011 by Dr. Lyla Son at [facility] eye  2+ mechanical falls in last year. No injury. Declines PT referral today for balance prevention program. Depression - PHQ9 = 11. Has been on wellbutrin for last year. More anxious intermittently and inside nervousness. zoloft and prozac has not worked well in past. Sleeping well with benadryl and neurontin. Good appetite.  Preventative: Colonoscopy 07/2009 at [facility], told normal per pt Celso Sickle)  Well woman with OBGYN - 12/2013 pending [facility] ([place])  Mammogram - 07/2013 normal diagnostic mammo Birads1 at [facility], rec rpt 1 yr. Breast exam with OBGYN.  Dexa - 12/2011 with Dr. Enzo Bi - pt thinks normal.  Flu - 12/2012 Pneumovax 2006. prevnar today Shingles shot 07/2011  Td 2007  Advanced directives: scanned and in chart. Does not want prolonged measures.  Daily caffeine use 6 cups every day  Lives with husband  Grown children nearby  Activity: no regular exercise  Diet: good water, fruits daily, some vegetables   Relevant past medical, surgical, family and social history reviewed and updated as indicated.  Allergies and medications reviewed and updated. Current Outpatient Prescriptions on File Prior to Visit  Medication Sig  . acetaminophen (TYLENOL) 500 MG tablet Take 500 mg by mouth every 6 (six) hours as needed.  Marland Kitchen amLODipine (NORVASC) 5 MG tablet Take 1 tablet (5 mg total) by mouth daily.  Marland Kitchen aspirin 81 MG tablet Take 81 mg by mouth. Twice a week  . Biotin 1000 MCG  tablet Take 1,000 mcg by mouth daily.  Marland Kitchen buPROPion (WELLBUTRIN) 75 MG tablet Take 75 mg by mouth daily.  . Calcium Carbonate-Vitamin D (CALCIUM 600+D HIGH POTENCY) 600-400 MG-UNIT per tablet Take 1 tablet by mouth daily.  . cholecalciferol (VITAMIN D) 1000 UNITS tablet Take 1,000 Units by mouth daily.  . diclofenac (FLECTOR) 1.3 % PTCH Place 1 patch onto the skin as needed.    . diphenhydrAMINE (BENADRYL) 25 mg capsule Take 25 mg by mouth at bedtime.   . fluticasone (FLONASE) 50 MCG/ACT nasal spray Place 2 sprays into the nose as needed.  . gabapentin (NEURONTIN) 100 MG capsule Take 200 mg by mouth at bedtime.  . gabapentin (NEURONTIN) 300 MG capsule Take 600 mg by mouth 3 (three) times daily.   . hyoscyamine (LEVSIN, ANASPAZ) 0.125 MG tablet Take 1 tablet (0.125 mg total) by mouth as needed.  . Lactobacillus Rhamnosus, GG, (CULTURELLE PO) Take 1 capsule by mouth daily.  Marland Kitchen levothyroxine (SYNTHROID, LEVOTHROID) 75 MCG tablet TAKE 1 TABLET BY MOUTH EVERY DAY, One day a week take 1/2 tablet.  . loperamide (IMODIUM) 2 MG capsule Take 2 mg by mouth as needed for diarrhea or loose stools.  . Magnesium 250 MG TABS Take by mouth daily.  . meclizine (ANTIVERT) 25 MG tablet Take 1 tablet (25 mg total) by mouth 3 (three) times daily as needed.  . Multiple Vitamins-Minerals (CENTRUM SILVER PO) Take by mouth.    . nitroGLYCERIN (  NITROSTAT) 0.4 MG SL tablet Place 0.4 mg under the tongue every 5 (five) minutes as needed.  . pantoprazole (PROTONIX) 40 MG tablet TAKE 1 TABLET BY MOUTH ONCE A DAY  . Potassium Gluconate 595 MG CAPS Take 1 capsule by mouth daily.  . Tragacanth (ASTRAGALUS ROOT) POWD Take by mouth daily.    Marland Kitchen triamterene-hydrochlorothiazide (MAXZIDE-25) 37.5-25 MG per tablet Take 1 tablet by mouth daily.  . vitamin B-12 (CYANOCOBALAMIN) 500 MCG tablet Take 1 tablet (500 mcg total) by mouth daily.   No current facility-administered medications on file prior to visit.    Review of Systems Per  HPI unless specifically indicated above    Objective:    BP 118/82  Pulse 71  Temp(Src) 97.8 F (36.6 C) (Oral)  Ht 4\' 11"  (1.499 m)  Wt 131 lb (59.421 kg)  BMI 26.44 kg/m2  SpO2 97%  Physical Exam  Nursing note and vitals reviewed. Constitutional: She is oriented to person, place, and time. She appears well-developed and well-nourished. No distress.  HENT:  Head: Normocephalic and atraumatic.  Right Ear: Hearing, tympanic membrane, external ear and ear canal normal.  Left Ear: Hearing, tympanic membrane, external ear and ear canal normal.  Nose: Nose normal.  Mouth/Throat: Uvula is midline, oropharynx is clear and moist and mucous membranes are normal. No oropharyngeal exudate, posterior oropharyngeal edema or posterior oropharyngeal erythema.  Eyes: Conjunctivae and EOM are normal. Pupils are equal, round, and reactive to light. No scleral icterus.  Neck: Normal range of motion. Neck supple. Carotid bruit is not present. No thyromegaly present.  Cardiovascular: Normal rate, regular rhythm and intact distal pulses.   Murmur (3/6 SEM best at LUSB) heard. Pulses:      Radial pulses are 2+ on the right side, and 2+ on the left side.  Pulmonary/Chest: Effort normal and breath sounds normal. No respiratory distress. She has no wheezes. She has no rales.  Abdominal: Soft. Bowel sounds are normal. She exhibits no distension and no mass. There is no tenderness. There is no rebound and no guarding.  Musculoskeletal: Normal range of motion. She exhibits no edema.  Lymphadenopathy:    She has no cervical adenopathy.  Neurological: She is alert and oriented to person, place, and time.  CN grossly intact, station and gait intact Recall 3/3 Calculation 4/5 serial 7s  Skin: Skin is warm and dry. No rash noted.  Psychiatric: She has a normal mood and affect. Her behavior is normal. Judgment and thought content normal.   Results for orders placed in visit on 10/04/13  LIPID PANEL      Result  Value Ref Range   Cholesterol 224 (*) 0 - 200 mg/dL   Triglycerides 185.0 (*) 0.0 - 149.0 mg/dL   HDL 57.70  >39.00 mg/dL   VLDL 37.0  0.0 - 40.0 mg/dL   LDL Cholesterol 129 (*) 0 - 99 mg/dL   Total CHOL/HDL Ratio 4     NonHDL 166.30    HEMOGLOBIN A1C      Result Value Ref Range   Hemoglobin A1C 6.1  4.6 - 6.5 %  BASIC METABOLIC PANEL      Result Value Ref Range   Sodium 137  135 - 145 mEq/L   Potassium 3.9  3.5 - 5.1 mEq/L   Chloride 100  96 - 112 mEq/L   CO2 29  19 - 32 mEq/L   Glucose, Bld 116 (*) 70 - 99 mg/dL   BUN 10  6 - 23 mg/dL   Creatinine,  Ser 0.8  0.4 - 1.2 mg/dL   Calcium 9.6  8.4 - 10.5 mg/dL   GFR 77.21  >60.00 mL/min  VITAMIN D 25 HYDROXY      Result Value Ref Range   VITD 47.39    TSH      Result Value Ref Range   TSH 0.62  0.35 - 4.50 uIU/mL      Assessment & Plan:   Problem List Items Addressed This Visit   Systolic murmur     Chronic, stable.    Prediabetes     Reviewed A1c with patient, longterm stability. Encouraged continued monitoring of diet and decreasing added sugars and sweets.    Memory deficit     Stable on exam mini today.    Medicare annual wellness visit, initial - Primary     I have personally reviewed the Medicare Annual Wellness questionnaire and have noted 1. The patient's medical and social history 2. Their use of alcohol, tobacco or illicit drugs 3. Their current medications and supplements 4. The patient's functional ability including ADL's, fall risks, home safety risks and hearing or visual impairment. 5. Diet and physical activity 6. Evidence for depression or mood disorders The patients weight, height, BMI have been recorded in the chart.  Hearing and vision has been addressed. I have made referrals, counseling and provided education to the patient based review of the above and I have provided the pt with a written personalized care plan for preventive services. Provider list updated - see scanned questionairre. Advanced  directives discussed: living will in our chart (01/2012)  Reviewed preventative protocols and updated unless pt declined.    Irritable bowel syndrome     Ongoing issue - continue levsin prn.    HYPOTHYROIDISM     Chronic, stable. Continue levothyroxine dose.    HYPERTENSION     Chronic, stable. Continue HTN regimen.    ANXIETY     Deteriorated despite wellbutrin 75mg  daily - suggested she try 75mg  bid dose. She will call me with update in next 2-3 weeks. If not tolerated, consider adjuvant antidepressant.    Advanced care planning/counseling discussion       Follow up plan: Return in about 6 months (around 04/13/2014), or as needed, for follow up visit.

## 2013-10-11 NOTE — Assessment & Plan Note (Signed)
Stable on exam mini today.

## 2013-10-11 NOTE — Assessment & Plan Note (Signed)
I have personally reviewed the Medicare Annual Wellness questionnaire and have noted 1. The patient's medical and social history 2. Their use of alcohol, tobacco or illicit drugs 3. Their current medications and supplements 4. The patient's functional ability including ADL's, fall risks, home safety risks and hearing or visual impairment. 5. Diet and physical activity 6. Evidence for depression or mood disorders The patients weight, height, BMI have been recorded in the chart.  Hearing and vision has been addressed. I have made referrals, counseling and provided education to the patient based review of the above and I have provided the pt with a written personalized care plan for preventive services. Provider list updated - see scanned questionairre. Advanced directives discussed: living will in our chart (01/2012)  Reviewed preventative protocols and updated unless pt declined.

## 2013-10-11 NOTE — Assessment & Plan Note (Signed)
Chronic, stable 

## 2013-10-14 ENCOUNTER — Ambulatory Visit: Payer: Self-pay | Admitting: Pain Medicine

## 2013-10-19 ENCOUNTER — Encounter: Payer: Self-pay | Admitting: Family Medicine

## 2013-11-09 ENCOUNTER — Other Ambulatory Visit: Payer: Self-pay | Admitting: Family Medicine

## 2013-11-24 ENCOUNTER — Ambulatory Visit: Payer: Self-pay | Admitting: Pain Medicine

## 2013-12-14 ENCOUNTER — Ambulatory Visit: Payer: Self-pay | Admitting: Pain Medicine

## 2013-12-27 ENCOUNTER — Ambulatory Visit (INDEPENDENT_AMBULATORY_CARE_PROVIDER_SITE_OTHER): Payer: Medicare Other

## 2013-12-27 DIAGNOSIS — Z23 Encounter for immunization: Secondary | ICD-10-CM

## 2014-01-09 ENCOUNTER — Other Ambulatory Visit: Payer: Self-pay | Admitting: Family Medicine

## 2014-01-19 ENCOUNTER — Ambulatory Visit: Payer: Self-pay | Admitting: Pain Medicine

## 2014-02-01 ENCOUNTER — Ambulatory Visit: Payer: Self-pay | Admitting: Pain Medicine

## 2014-02-22 ENCOUNTER — Ambulatory Visit: Payer: Self-pay | Admitting: Pain Medicine

## 2014-03-06 ENCOUNTER — Other Ambulatory Visit: Payer: Self-pay | Admitting: Family Medicine

## 2014-03-17 ENCOUNTER — Ambulatory Visit (INDEPENDENT_AMBULATORY_CARE_PROVIDER_SITE_OTHER): Payer: Medicare Other | Admitting: Internal Medicine

## 2014-03-17 ENCOUNTER — Encounter: Payer: Self-pay | Admitting: Internal Medicine

## 2014-03-17 VITALS — BP 138/80 | HR 73 | Temp 97.5°F | Wt 131.0 lb

## 2014-03-17 DIAGNOSIS — J209 Acute bronchitis, unspecified: Secondary | ICD-10-CM | POA: Insufficient documentation

## 2014-03-17 NOTE — Progress Notes (Signed)
Subjective:    Patient ID: Cheyenne Pierce, female    DOB: [birth year], 79 y.o.   MRN: [id number]  HPI Here diue to respiratory symptoms  Started with sore throat and then laryngitis Now with bad cough Has pain in chest Started about 3 days ago Cough productive of thick green sputum Some head congestion last night--mild  No SOB No fever Did have some sweats last night and stays cold  Tried mucinex bid sucrets for sore throat On flonase for the nasal congestion--this did help some  Current Outpatient Prescriptions on File Prior to Visit  Medication Sig Dispense Refill  . acetaminophen (TYLENOL) 500 MG tablet Take 500 mg by mouth every 6 (six) hours as needed.    Marland Kitchen amLODipine (NORVASC) 5 MG tablet Take 1 tablet (5 mg total) by mouth daily. 30 tablet 11  . aspirin 81 MG tablet Take 81 mg by mouth. Twice a week    . Biotin 1000 MCG tablet Take 1,000 mcg by mouth daily.    Marland Kitchen buPROPion (WELLBUTRIN) 75 MG tablet Take 1 tablet (75 mg total) by mouth 2 (two) times daily. 60 tablet 3  . Calcium Carbonate-Vitamin D (CALCIUM 600+D HIGH POTENCY) 600-400 MG-UNIT per tablet Take 1 tablet by mouth daily.    . cholecalciferol (VITAMIN D) 1000 UNITS tablet Take 1,000 Units by mouth daily.    . diclofenac (FLECTOR) 1.3 % PTCH Place 1 patch onto the skin as needed.      . diphenhydrAMINE (BENADRYL) 25 mg capsule Take 25 mg by mouth at bedtime.     . fluticasone (FLONASE) 50 MCG/ACT nasal spray Place 2 sprays into the nose as needed.    . gabapentin (NEURONTIN) 100 MG capsule Take 200 mg by mouth at bedtime.    . gabapentin (NEURONTIN) 300 MG capsule Take 600 mg by mouth 3 (three) times daily.     . hyoscyamine (LEVSIN, ANASPAZ) 0.125 MG tablet Take 1 tablet (0.125 mg total) by mouth as needed. 30 tablet 3  . Lactobacillus Rhamnosus, GG, (CULTURELLE PO) Take 1 capsule by mouth daily.    Marland Kitchen levothyroxine (SYNTHROID, LEVOTHROID) 75 MCG tablet take 1 tablet by mouth once daily 30 tablet 6  .  loperamide (IMODIUM) 2 MG capsule Take 2 mg by mouth as needed for diarrhea or loose stools.    . Magnesium 250 MG TABS Take by mouth daily.    . meclizine (ANTIVERT) 25 MG tablet Take 1 tablet (25 mg total) by mouth 3 (three) times daily as needed. 30 tablet 3  . Multiple Vitamins-Minerals (CENTRUM SILVER PO) Take by mouth.      . nitroGLYCERIN (NITROSTAT) 0.4 MG SL tablet Place 0.4 mg under the tongue every 5 (five) minutes as needed.    . pantoprazole (PROTONIX) 40 MG tablet take 1 tablet by mouth once daily 90 tablet 2  . Potassium Gluconate 595 MG CAPS Take 1 capsule by mouth daily.    . Tragacanth (ASTRAGALUS ROOT) POWD Take by mouth daily.      Marland Kitchen triamterene-hydrochlorothiazide (MAXZIDE-25) 37.5-25 MG per tablet Take 1 tablet by mouth daily. 30 tablet 11  . vitamin B-12 (CYANOCOBALAMIN) 500 MCG tablet Take 1 tablet (500 mcg total) by mouth daily.     No current facility-administered medications on file prior to visit.    Allergies  Allergen Reactions  . Zoloft [Sertraline Hcl] Rash    Oral blisters and skin rash  . Amoxicillin     REACTION: diarrhea  . Codeine Phosphate  REACTION: nausea  . Metronidazole     REACTION: Rash, tongue ulcers  . Oxcarbazepine     REACTION: rash  . Perindopril Erbumine     REACTION: unspecified  . Potassium-Containing Compounds Other (See Comments)    abd pain with oral potassium pills, h/o ulcers  . Prednisone     REACTION: swelling  . Prozac [Fluoxetine Hcl] Other (See Comments)    Trouble sleeping, night sweats, rapid hear beat  . Tramadol Hcl     REACTION: diarrhea    Past Medical History  Diagnosis Date  . Allergy   . Anxiety   . Hypertension   . Osteopenia     DEXA 2013 T -1.0  . IBS (irritable bowel syndrome)   . Hypothyroidism   . GERD (gastroesophageal reflux disease)   . Gastritis   . Peripheral neuropathy     in feet, ESI have helped [name])  . DDD (degenerative disc disease), cervical     throughout spine     Past Surgical History  Procedure Laterality Date  . Appendectomy  1948  . Abdominal hysterectomy  1968    left ovary remains  . Ovarian cyst surgery  1963    cyst on ovaries  . Cardiac catheterization  09/26/1998  . Hernia repair    . Colostomy w/ rectocele repair  06/2004  . Ventral hernia repair  08/2008    abdominal wall, lysis of adhesions  . Colonoscopy  07/2009    [facility]), normal per pt  . Epidural steroid injections  multiple    help periph neuropathy  . US echocardiography  2009    mild aortic/mitral insuff, ER 38%, mild diastolic dysfunction  . Nm myoview ltd  2009    WNL per report  . Dexa  2013    normal    Family History  Problem Relation Age of Onset  . Heart disease Mother   . Stroke Brother     2 strokes  . Hypertension Neg Hx   . Diabetes Neg Hx   . Cancer Neg Hx     breast or colon cancer, no history    History   Social History  . Marital Status: Widowed    Spouse Name: N/A    Number of Children: 64  . Years of Education: N/A   Occupational History  . Not on file.   Social History Main Topics  . Smoking status: Former Smoker -- 1.00 packs/day for 30 years    Quit date: 05/19/1978  . Smokeless tobacco: Never Used  . Alcohol Use: No  . Drug Use: No  . Sexual Activity: Not on file   Other Topics Concern  . Not on file   Social History Narrative   Daily caffeine use 6 cups every day   Lives with husband   Grown children nearby   Activity: no regular exercise   Diet: good water, fruits daily, some vegetables   Review of Systems No rash No vomiting or diarrhea Appetite is off some    Objective:   Physical Exam  Constitutional: She appears well-developed and well-nourished. No distress.  HENT:  Mouth/Throat: Oropharynx is clear and moist. No oropharyngeal exudate.  No sinus tenderness Mild nasal congestion TMs normal  Neck: Normal range of motion. Neck supple.  Pulmonary/Chest: Effort normal and breath sounds normal.  No respiratory distress. She has no wheezes. She has no rales.  Lymphadenopathy:    She has no cervical adenopathy.  Assessment & Plan:

## 2014-03-17 NOTE — Assessment & Plan Note (Signed)
Clearly has viral type picture and she is a non smoker Discussed that supportive Rx would be only option unless she worsens Intolerant of antibiotics but would try z-pak or doxy if worsens next week

## 2014-03-17 NOTE — Progress Notes (Signed)
Pre visit review using our clinic review tool, if applicable. No additional management support is needed unless otherwise documented below in the visit note. 

## 2014-03-23 ENCOUNTER — Other Ambulatory Visit: Payer: Self-pay | Admitting: Family Medicine

## 2014-03-24 ENCOUNTER — Ambulatory Visit: Payer: Self-pay | Admitting: Pain Medicine

## 2014-04-11 ENCOUNTER — Encounter: Payer: Self-pay | Admitting: Family Medicine

## 2014-04-11 ENCOUNTER — Ambulatory Visit (INDEPENDENT_AMBULATORY_CARE_PROVIDER_SITE_OTHER): Payer: Medicare Other | Admitting: Family Medicine

## 2014-04-11 VITALS — BP 110/60 | HR 76 | Temp 97.8°F | Wt 130.2 lb

## 2014-04-11 DIAGNOSIS — F411 Generalized anxiety disorder: Secondary | ICD-10-CM

## 2014-04-11 DIAGNOSIS — M25551 Pain in right hip: Secondary | ICD-10-CM

## 2014-04-11 DIAGNOSIS — I10 Essential (primary) hypertension: Secondary | ICD-10-CM

## 2014-04-11 DIAGNOSIS — M5030 Other cervical disc degeneration, unspecified cervical region: Secondary | ICD-10-CM

## 2014-04-11 MED ORDER — AMLODIPINE BESYLATE 2.5 MG PO TABS: 2.5000 mg | ORAL_TABLET | Freq: Every day | ORAL | Status: DC

## 2014-04-11 NOTE — Assessment & Plan Note (Signed)
Exam today not consistent with hip osteoarthritis or hip joint etiology but rather combination of lumbar DDD, sciatica, bursitis and sacroiliitis. Continue current meds. Keep appt tomorrow with pain management. Referral placed today.

## 2014-04-11 NOTE — Assessment & Plan Note (Signed)
Chronic, stable. BP Readings from Last 3 Encounters:  04/11/14 110/60  03/17/14 138/80  10/11/13 118/82  good control recently - will try lower 2.5mg  dose. Sent new dose to pharmacy.

## 2014-04-11 NOTE — Patient Instructions (Addendum)
Keep appointment tomorrow with Dr [name]. Have him send me a copy of his note.  We will place referral today - pass by Marion's office to sent this up. Good to see you today, call us with questions.

## 2014-04-11 NOTE — Progress Notes (Signed)
BP 110/60 mmHg  Pulse 76  Temp(Src) 97.8 F (36.6 C) (Oral)  Wt 130 lb 4 oz (59.081 kg)   CC: 6 mo f/u, discuss hip pain  Subjective:    Patient ID: Cheyenne Pierce, female    DOB: 08-Apr-1934, 80 y.o.   MRN: [id number]  HPI: Cheyenne Pierce is a 80 y.o. female presenting on 04/11/2014 for Hip Pain   Anxiety - last visit wellbutrin increased to 75mg  bid. Stable from this standpoint.  R hip pain ongoing for last 2 weeks. Denies inciting trauma. Known significant arthritis especially DDD of spine. Increased walking through holidays. Denies radiculopathy, numbness or tingling. Known periph neuropathy presumed from spine.  She sees Dr [name] with [facility], next appt tomorrow 04/12/2014 - needs new referral with new year. Sees for spine DDD. Known peripheral neuropathy. Tried cane but didn't feel this was useful.  Referred by OBGYN - to GI saw Vickey Huger for IBS and constipation, and referred to urology - saw Dr Erlene Quan for painful urethra upcoming appt  Relevant past medical, surgical, family and social history reviewed and updated as indicated. Interim medical history since our last visit reviewed. Allergies and medications reviewed and updated. Current Outpatient Prescriptions on File Prior to Visit  Medication Sig  . acetaminophen (TYLENOL) 500 MG tablet Take 500 mg by mouth every 6 (six) hours as needed.  Marland Kitchen aspirin 81 MG tablet Take 81 mg by mouth. Twice a week  . Biotin 1000 MCG tablet Take 1,000 mcg by mouth daily.  Marland Kitchen buPROPion (WELLBUTRIN) 75 MG tablet take 1 tablet by mouth twice a day  . Calcium Carbonate-Vitamin D (CALCIUM 600+D HIGH POTENCY) 600-400 MG-UNIT per tablet Take 1 tablet by mouth daily.  . cholecalciferol (VITAMIN D) 1000 UNITS tablet Take 1,000 Units by mouth daily.  . diclofenac (FLECTOR) 1.3 % PTCH Place 1 patch onto the skin as needed.    . diphenhydrAMINE (BENADRYL) 25 mg capsule Take 25 mg by mouth at bedtime.   .  fentaNYL (DURAGESIC - DOSED MCG/HR) 12 MCG/HR   . fluticasone (FLONASE) 50 MCG/ACT nasal spray Place 2 sprays into the nose as needed.  . gabapentin (NEURONTIN) 300 MG capsule Take 600 mg by mouth 3 (three) times daily.   . hyoscyamine (LEVSIN, ANASPAZ) 0.125 MG tablet Take 1 tablet (0.125 mg total) by mouth as needed.  . Lactobacillus Rhamnosus, GG, (CULTURELLE PO) Take 1 capsule by mouth daily.  Marland Kitchen levothyroxine (SYNTHROID, LEVOTHROID) 75 MCG tablet take 1 tablet by mouth once daily  . loperamide (IMODIUM) 2 MG capsule Take 2 mg by mouth as needed for diarrhea or loose stools.  . Magnesium 250 MG TABS Take by mouth daily.  . meclizine (ANTIVERT) 25 MG tablet Take 1 tablet (25 mg total) by mouth 3 (three) times daily as needed.  . Multiple Vitamins-Minerals (CENTRUM SILVER PO) Take by mouth.    . nitroGLYCERIN (NITROSTAT) 0.4 MG SL tablet Place 0.4 mg under the tongue every 5 (five) minutes as needed.  . pantoprazole (PROTONIX) 40 MG tablet take 1 tablet by mouth once daily  . Potassium Gluconate 595 MG CAPS Take 1 capsule by mouth daily.  . Tragacanth (ASTRAGALUS ROOT) POWD Take by mouth daily.    Marland Kitchen triamterene-hydrochlorothiazide (MAXZIDE-25) 37.5-25 MG per tablet Take 1 tablet by mouth daily.  Marland Kitchen gabapentin (NEURONTIN) 100 MG capsule Take 200 mg by mouth at bedtime.  . vitamin B-12 (CYANOCOBALAMIN) 500 MCG tablet Take 1 tablet (500 mcg total) by mouth  daily. (Patient not taking: Reported on 04/11/2014)   No current facility-administered medications on file prior to visit.    Review of Systems Per HPI unless specifically indicated above     Objective:    BP 110/60 mmHg  Pulse 76  Temp(Src) 97.8 F (36.6 C) (Oral)  Wt 130 lb 4 oz (59.081 kg)  Wt Readings from Last 3 Encounters:  04/11/14 130 lb 4 oz (59.081 kg)  03/17/14 131 lb (59.421 kg)  10/11/13 131 lb (59.421 kg)    Physical Exam  Constitutional: She appears well-developed and well-nourished. No distress.  Musculoskeletal:  She exhibits no edema.  + tender to palpation midline spine and paraspinous mm  Neg SLR bilaterally. No pain with int/ext rotation at hip. + FABER  Tender at SIJ, GTB and sciatic notch on right  Skin: Skin is warm and dry. No rash noted.  Nursing note and vitals reviewed.  Results for orders placed or performed in visit on 10/19/13  HM DEXA SCAN  Result Value Ref Range   HM Dexa Scan osteopenia       Assessment & Plan:   Problem List Items Addressed This Visit    Right hip pain - Primary    Exam today not consistent with hip osteoarthritis or hip joint etiology but rather combination of lumbar DDD, sciatica, bursitis and sacroiliitis. Continue current meds. Keep appt tomorrow with pain management. Referral placed today.    GAD (generalized anxiety disorder)    Seem stable on current regimen. Continue wellbutrin 75mg  bid    Essential hypertension    Chronic, stable. BP Readings from Last 3 Encounters:  04/11/14 110/60  03/17/14 138/80  10/11/13 118/82  good control recently - will try lower 2.5mg  dose. Sent new dose to pharmacy.    Relevant Medications      amLODIpine (NORVASC)  tablet   DDD (degenerative disc disease), cervical   Relevant Orders      Ambulatory referral to Pain Clinic       Follow up plan: Return in about 6 months (around 10/10/2014), or if symptoms worsen or fail to improve, for medicare wellness.

## 2014-04-11 NOTE — Progress Notes (Signed)
Pre visit review using our clinic review tool, if applicable. No additional management support is needed unless otherwise documented below in the visit note. 

## 2014-04-11 NOTE — Assessment & Plan Note (Signed)
Seem stable on current regimen. Continue wellbutrin 75mg  bid

## 2014-04-13 ENCOUNTER — Ambulatory Visit: Payer: Medicare Other | Admitting: Family Medicine

## 2014-05-04 ENCOUNTER — Telehealth: Payer: Self-pay | Admitting: Family Medicine

## 2014-05-04 ENCOUNTER — Ambulatory Visit: Payer: Self-pay | Admitting: Pain Medicine

## 2014-05-04 MED ORDER — BENZONATATE 100 MG PO CAPS: 100.0000 mg | ORAL_CAPSULE | Freq: Two times a day (BID) | ORAL | Status: DC | PRN

## 2014-05-04 NOTE — Telephone Encounter (Signed)
Pt called wanting dr g to call her something in for a dry cough.  She has had this cough since Sunday. She took benzonatae 100mg  last night  This help some She can not take anything with codine.  [facility] [address] [place]

## 2014-05-04 NOTE — Telephone Encounter (Signed)
Patient notified and verbalized understanding. 

## 2014-05-04 NOTE — Telephone Encounter (Signed)
Tessalon perls sent in for patient. If fever or worsening productive cough rec come in for evaluation.

## 2014-05-12 ENCOUNTER — Ambulatory Visit: Payer: Self-pay | Admitting: Pain Medicine

## 2014-05-20 ENCOUNTER — Encounter: Payer: Self-pay | Admitting: Family Medicine

## 2014-06-06 HISTORY — PX: COLONOSCOPY: SHX174

## 2014-06-08 ENCOUNTER — Other Ambulatory Visit: Payer: Self-pay | Admitting: Family Medicine

## 2014-06-22 ENCOUNTER — Ambulatory Visit: Payer: Self-pay | Admitting: Gastroenterology

## 2014-06-27 ENCOUNTER — Encounter: Payer: Self-pay | Admitting: Family Medicine

## 2014-07-04 ENCOUNTER — Other Ambulatory Visit: Payer: Self-pay | Admitting: Family Medicine

## 2014-08-07 ENCOUNTER — Encounter: Payer: Self-pay | Admitting: Family Medicine

## 2014-08-07 ENCOUNTER — Ambulatory Visit (INDEPENDENT_AMBULATORY_CARE_PROVIDER_SITE_OTHER)
Admission: RE | Admit: 2014-08-07 | Discharge: 2014-08-07 | Disposition: A | Discharge status: Home / Self Care | Payer: Medicare Other | Source: Ambulatory Visit | Attending: Family Medicine | Admitting: Family Medicine

## 2014-08-07 ENCOUNTER — Ambulatory Visit (INDEPENDENT_AMBULATORY_CARE_PROVIDER_SITE_OTHER): Payer: Medicare Other | Admitting: Family Medicine

## 2014-08-07 VITALS — BP 128/70 | HR 72 | Temp 98.1°F | Wt 129.2 lb

## 2014-08-07 DIAGNOSIS — S8992XA Unspecified injury of left lower leg, initial encounter: Secondary | ICD-10-CM

## 2014-08-07 NOTE — Progress Notes (Signed)
BP 128/70 mmHg  Pulse 72  Temp(Src) 98.1 F (36.7 C) (Oral)  Wt 129 lb 4 oz (58.627 kg)   CC: L foot injury falling down stairs  Subjective:    Patient ID: Cheyenne Pierce, female    DOB: March 17, 1935, 80 y.o.   MRN: [id number]  HPI: Cheyenne Pierce is a 80 y.o. female presenting on 08/07/2014 for Foot Injury   Saturday while in [place] walking down stairs missed steps and fell into rocks. Hit lateral ankle may have twisted it. Bruising noted lateral ankle. No LOC, dizziness or presyncope.  Presents today 3d later for evaluation. Has been using crutches and then husband's walker for ambulation.   Had fall earlier this spring (April 1st) in back yard while walking on unlevel ground, fell into grass. No injury then.   Known osteopenia T -1.0 (2013). Known peripheral neuropathy on flector patch, neurontin and fentanyl.  Relevant past medical, surgical, family and social history reviewed and updated as indicated. Interim medical history since our last visit reviewed. Allergies and medications reviewed and updated. Current Outpatient Prescriptions on File Prior to Visit  Medication Sig  . acetaminophen (TYLENOL) 500 MG tablet Take 500 mg by mouth every 6 (six) hours as needed.  Marland Kitchen amLODipine (NORVASC) 2.5 MG tablet Take 1 tablet (2.5 mg total) by mouth daily.  Marland Kitchen aspirin 81 MG tablet Take 81 mg by mouth. Twice a week  . benzonatate (TESSALON) 100 MG capsule Take 1 capsule (100 mg total) by mouth 2 (two) times daily as needed for cough.  . Biotin 1000 MCG tablet Take 1,000 mcg by mouth daily.  Marland Kitchen buPROPion (WELLBUTRIN) 75 MG tablet take 1 tablet by mouth twice a day  . Calcium Carbonate-Vitamin D (CALCIUM 600+D HIGH POTENCY) 600-400 MG-UNIT per tablet Take 1 tablet by mouth daily.  . cholecalciferol (VITAMIN D) 1000 UNITS tablet Take 1,000 Units by mouth daily.  . diclofenac (FLECTOR) 1.3 % PTCH Place 1 patch onto the skin as needed.    . diphenhydrAMINE (BENADRYL) 25 mg capsule Take 25 mg by  mouth at bedtime.   Marland Kitchen estradiol (ESTRACE) 0.1 MG/GM vaginal cream Place 1 Applicatorful vaginally 2 (two) times a week.  . fentaNYL (DURAGESIC - DOSED MCG/HR) 12 MCG/HR   . fluticasone (FLONASE) 50 MCG/ACT nasal spray Place 2 sprays into the nose as needed.  . gabapentin (NEURONTIN) 100 MG capsule Take 200 mg by mouth at bedtime.  . gabapentin (NEURONTIN) 300 MG capsule Take 600 mg by mouth 3 (three) times daily.   . hyoscyamine (LEVSIN, ANASPAZ) 0.125 MG tablet take 1 tablet by mouth AS NEEDED  . Lactobacillus Rhamnosus, GG, (CULTURELLE PO) Take 1 capsule by mouth daily.  Marland Kitchen levothyroxine (SYNTHROID, LEVOTHROID) 75 MCG tablet take 1 tablet by mouth once daily  . loperamide (IMODIUM) 2 MG capsule Take 2 mg by mouth as needed for diarrhea or loose stools.  . Magnesium 250 MG TABS Take by mouth daily.  . meclizine (ANTIVERT) 25 MG tablet take 1 tablet by mouth three times a day if needed  . Multiple Vitamins-Minerals (CENTRUM SILVER PO) Take by mouth.    . nitroGLYCERIN (NITROSTAT) 0.4 MG SL tablet Place 0.4 mg under the tongue every 5 (five) minutes as needed.  . pantoprazole (PROTONIX) 40 MG tablet take 1 tablet by mouth once daily  . Potassium Gluconate 595 MG CAPS Take 1 capsule by mouth daily.  . Tragacanth (ASTRAGALUS ROOT) POWD Take by mouth daily.    Marland Kitchen triamterene-hydrochlorothiazide (MAXZIDE-25) 37.5-25 MG  per tablet take 1 tablet by mouth once daily  . vitamin B-12 (CYANOCOBALAMIN) 500 MCG tablet Take 1 tablet (500 mcg total) by mouth daily.   No current facility-administered medications on file prior to visit.    Review of Systems Per HPI unless specifically indicated above     Objective:    BP 128/70 mmHg  Pulse 72  Temp(Src) 98.1 F (36.7 C) (Oral)  Wt 129 lb 4 oz (58.627 kg)  Wt Readings from Last 3 Encounters:  08/07/14 129 lb 4 oz (58.627 kg)  04/11/14 130 lb 4 oz (59.081 kg)  03/17/14 131 lb (59.421 kg)    Physical Exam  Constitutional: She appears well-developed  and well-nourished. No distress.  Musculoskeletal: She exhibits edema.  R foot WNL 2+ DP bilaterally L foot-  No deformity appreciated Unable to bear weight 2/2 pain + pain with squeeze test mid lower leg and below No pain at base of 5th MT or at navicular Tender to palpation lower leg and lateral ankle, tender at lateral malleolus Tender to percussion medial malleolus. No ankle ligament laxity appreciated Bruising lateral ankle present  Neurological:  Sensation intact BLE  Skin: Skin is warm and dry. No rash noted.  Nursing note and vitals reviewed.  Results for orders placed or performed in visit on 10/19/13  HM DEXA SCAN  Result Value Ref Range   HM Dexa Scan osteopenia       Assessment & Plan:   Problem List Items Addressed This Visit    Left leg injury - Primary    H/o mild osteopenia. Unable to bear weight, squeeze test +, point tender at lateral ankle and tibia. Concern for tibial or ankle fracture - xrays today. Clear on my read.  Place in CAM walker, continue regular walker use, continue icing leg, and RTC 1 wk for recheck. Tylenol 500mg  TID scheduled for next week. Pt declines stronger pain medication.      Relevant Orders   DG Ankle Complete Left (Completed)   DG Tibia/Fibula Left (Completed)       Follow up plan: Return in about 1 week (around 08/14/2014) for follow up visit.

## 2014-08-07 NOTE — Progress Notes (Signed)
Pre visit review using our clinic review tool, if applicable. No additional management support is needed unless otherwise documented below in the visit note. 

## 2014-08-07 NOTE — Assessment & Plan Note (Addendum)
H/o mild osteopenia. Unable to bear weight, squeeze test +, point tender at lateral ankle and tibia. Concern for tibial or ankle fracture - xrays today. Clear on my read.  Place in CAM walker, continue regular walker use, continue icing leg, and RTC 1 wk for recheck. Tylenol 500mg  TID scheduled for next week. Pt declines stronger pain medication.

## 2014-08-07 NOTE — Patient Instructions (Addendum)
Xray today - looking ok. Place in CAM walker today, continue regular walker Continue tylenol 500mg  three times daily for pain. Continue ice to ankle and leg. Return in 1 week for f/u visit.

## 2014-08-08 ENCOUNTER — Other Ambulatory Visit: Payer: Self-pay | Admitting: Family Medicine

## 2014-08-14 ENCOUNTER — Ambulatory Visit: Payer: Medicare Other | Admitting: Family Medicine

## 2014-09-12 ENCOUNTER — Other Ambulatory Visit
Admission: RE | Admit: 2014-09-12 | Discharge: 2014-09-12 | Disposition: A | Discharge status: Home / Self Care | Payer: Medicare Other | Source: Ambulatory Visit | Attending: Optometry | Admitting: Optometry

## 2014-09-12 ENCOUNTER — Ambulatory Visit: Payer: Self-pay | Admitting: Obstetrics and Gynecology

## 2014-09-12 DIAGNOSIS — M316 Other giant cell arteritis: Secondary | ICD-10-CM | POA: Diagnosis present

## 2014-09-12 LAB — C-REACTIVE PROTEIN: CRP: 0.7 mg/dL (ref <1.0)

## 2014-09-12 LAB — SEDIMENTATION RATE: SED RATE: 9 mm/h (ref 0–30)

## 2014-10-02 ENCOUNTER — Other Ambulatory Visit: Payer: Self-pay | Admitting: Family Medicine

## 2014-10-06 ENCOUNTER — Other Ambulatory Visit: Payer: Self-pay | Admitting: Family Medicine

## 2014-10-06 ENCOUNTER — Other Ambulatory Visit (INDEPENDENT_AMBULATORY_CARE_PROVIDER_SITE_OTHER): Payer: Medicare Other

## 2014-10-06 DIAGNOSIS — E785 Hyperlipidemia, unspecified: Secondary | ICD-10-CM

## 2014-10-06 DIAGNOSIS — R7309 Other abnormal glucose: Secondary | ICD-10-CM

## 2014-10-06 DIAGNOSIS — E039 Hypothyroidism, unspecified: Secondary | ICD-10-CM

## 2014-10-06 DIAGNOSIS — I10 Essential (primary) hypertension: Secondary | ICD-10-CM

## 2014-10-06 DIAGNOSIS — R7303 Prediabetes: Secondary | ICD-10-CM

## 2014-10-06 LAB — LIPID PANEL
Cholesterol: 196 mg/dL (ref 0–200)
HDL: 60.4 mg/dL (ref >39.00)
LDL CALC: 111 mg/dL — AB (ref 0–99)
NONHDL: 135.6
Total CHOL/HDL Ratio: 3
Triglycerides: 121 mg/dL (ref 0.0–149.0)
VLDL: 24.2 mg/dL (ref 0.0–40.0)

## 2014-10-06 LAB — HEMOGLOBIN A1C: Hgb A1c MFr Bld: 5.6 % (ref 4.6–6.5)

## 2014-10-06 LAB — BASIC METABOLIC PANEL
BUN: 12 mg/dL (ref 6–23)
CO2: 29 meq/L (ref 19–32)
CREATININE: 0.75 mg/dL (ref 0.40–1.20)
Calcium: 9.7 mg/dL (ref 8.4–10.5)
Chloride: 99 mEq/L (ref 96–112)
GFR: 79.38 mL/min (ref >60.00)
GLUCOSE: 106 mg/dL — AB (ref 70–99)
POTASSIUM: 4 meq/L (ref 3.5–5.1)
SODIUM: 136 meq/L (ref 135–145)

## 2014-10-06 LAB — T4, FREE: Free T4: 1.17 ng/dL (ref 0.60–1.60)

## 2014-10-06 LAB — TSH: TSH: 0.33 u[IU]/mL — ABNORMAL LOW (ref 0.35–4.50)

## 2014-10-10 ENCOUNTER — Other Ambulatory Visit: Payer: Medicare Other

## 2014-10-16 ENCOUNTER — Ambulatory Visit (INDEPENDENT_AMBULATORY_CARE_PROVIDER_SITE_OTHER): Payer: Medicare Other | Admitting: Family Medicine

## 2014-10-16 ENCOUNTER — Encounter: Payer: Self-pay | Admitting: Family Medicine

## 2014-10-16 VITALS — BP 112/78 | HR 76 | Temp 98.0°F | Ht 59.0 in | Wt 130.2 lb

## 2014-10-16 DIAGNOSIS — F411 Generalized anxiety disorder: Secondary | ICD-10-CM

## 2014-10-16 DIAGNOSIS — I10 Essential (primary) hypertension: Secondary | ICD-10-CM | POA: Diagnosis not present

## 2014-10-16 DIAGNOSIS — Z7189 Other specified counseling: Secondary | ICD-10-CM

## 2014-10-16 DIAGNOSIS — M8580 Other specified disorders of bone density and structure, unspecified site: Secondary | ICD-10-CM

## 2014-10-16 DIAGNOSIS — R296 Repeated falls: Secondary | ICD-10-CM | POA: Insufficient documentation

## 2014-10-16 DIAGNOSIS — R61 Generalized hyperhidrosis: Secondary | ICD-10-CM | POA: Insufficient documentation

## 2014-10-16 DIAGNOSIS — E039 Hypothyroidism, unspecified: Secondary | ICD-10-CM

## 2014-10-16 DIAGNOSIS — Z0000 Encounter for general adult medical examination without abnormal findings: Secondary | ICD-10-CM | POA: Diagnosis not present

## 2014-10-16 DIAGNOSIS — E785 Hyperlipidemia, unspecified: Secondary | ICD-10-CM

## 2014-10-16 DIAGNOSIS — K219 Gastro-esophageal reflux disease without esophagitis: Secondary | ICD-10-CM

## 2014-10-16 DIAGNOSIS — G609 Hereditary and idiopathic neuropathy, unspecified: Secondary | ICD-10-CM

## 2014-10-16 LAB — CBC WITH DIFFERENTIAL/PLATELET
BASOS PCT: 0.7 % (ref 0.0–3.0)
Basophils Absolute: 0 10*3/uL (ref 0.0–0.1)
EOS ABS: 0.1 10*3/uL (ref 0.0–0.7)
EOS PCT: 3.2 % (ref 0.0–5.0)
HEMATOCRIT: 41.5 % (ref 36.0–46.0)
HEMOGLOBIN: 13.9 g/dL (ref 12.0–15.0)
LYMPHS ABS: 1.7 10*3/uL (ref 0.7–4.0)
LYMPHS PCT: 38.8 % (ref 12.0–46.0)
MCHC: 33.5 g/dL (ref 30.0–36.0)
MCV: 91.6 fl (ref 78.0–100.0)
MONO ABS: 0.4 10*3/uL (ref 0.1–1.0)
Monocytes Relative: 10.1 % (ref 3.0–12.0)
Neutro Abs: 2 10*3/uL (ref 1.4–7.7)
Neutrophils Relative %: 47.2 % (ref 43.0–77.0)
PLATELETS: 327 10*3/uL (ref 150.0–400.0)
RBC: 4.53 Mil/uL (ref 3.87–5.11)
RDW: 14.2 % (ref 11.5–15.5)
WBC: 4.3 10*3/uL (ref 4.0–10.5)

## 2014-10-16 LAB — HEPATIC FUNCTION PANEL
ALBUMIN: 4.2 g/dL (ref 3.5–5.2)
ALK PHOS: 54 U/L (ref 39–117)
ALT: 19 U/L (ref 0–35)
AST: 25 U/L (ref 0–37)
Bilirubin, Direct: 0.1 mg/dL (ref 0.0–0.3)
Total Bilirubin: 0.4 mg/dL (ref 0.2–1.2)
Total Protein: 7.2 g/dL (ref 6.0–8.3)

## 2014-10-16 LAB — SEDIMENTATION RATE: Sed Rate: 14 mm/hr (ref 0–22)

## 2014-10-16 MED ORDER — PANTOPRAZOLE SODIUM 40 MG PO TBEC: 40.0000 mg | DELAYED_RELEASE_TABLET | Freq: Every day | ORAL | Status: DC

## 2014-10-16 MED ORDER — BUPROPION HCL 100 MG PO TABS: 100.0000 mg | ORAL_TABLET | Freq: Two times a day (BID) | ORAL | Status: DC

## 2014-10-16 MED ORDER — LEVOTHYROXINE SODIUM 50 MCG PO TABS: 50.0000 ug | ORAL_TABLET | Freq: Every day | ORAL | Status: DC

## 2014-10-16 NOTE — Addendum Note (Signed)
Addended by: Ria Bush on: 10/16/2014 01:00 PM   Modules accepted: Orders, SmartSet

## 2014-10-16 NOTE — Progress Notes (Signed)
BP 112/78 mmHg  Pulse 76  Temp(Src) 98 F (36.7 C) (Oral)  Ht 4' 11" (1.499 m)  Wt 130 lb 4 oz (59.081 kg)  BMI 26.29 kg/m2   CC: medicare wellness visit  Subjective:    Patient ID: Cheyenne Pierce, female    DOB: 10-21-1934, 80 y.o.   MRN: [id number]  HPI: Cheyenne Pierce is a 80 y.o. female presenting on 10/16/2014 for Annual Exam   Has several concerns today -  L facial pain. Wonders if wellbutrin related. Started since left upper molar was pulled. Eye ok.   Mood - requests increase in wellbutrin to 151m BID.   Feels irritation in back of throat. Dry pills occasionally get stuck. Dry cough with raspy voice sometimes present. Uses ricola cough drops, tessalon perls, flonase constantly.   Night sweats nightly. Wake her up, drench pillow. Feels warm.   Ongoing bad joint pains hands, hips, knees, feet. [facility].   Hearing screen passed today. [facility]. 3 mechanical falls in last year. No injury. Does not use ambulatory assistive devjce. Agrees to PT referral today for balance prevention program.  Depression screen passed. Has been on wellbutrin for last year. More anxious intermittently and inside nervousness. zoloft and prozac have not worked well in past. Sleeping well with benadryl and neurontin. Good appetite.  Preventative[facility] ([facility] Sickle Well woman with OBGYN - 12/2013 [facility] ([place])  [facility]2013 [facility]. Breast exam with OBGYN.  Dexa - 12/2011 with Dr. DEnzo Bi- pt thinks normal.  Flu - yearly Pneumovax 2006. prevnar 2015 Shingles shot 07/2011  Td 2007  Advanced directives: scanned and in chart. HCPOA is daughter then sons. Does not want prolonged measures. Seat belt use discussed Sunscreen use discussed  Daily caffeine use 6 cups every day  Lives  with husband  Grown children nearby  Activity: no regular exercise  Diet: good water, fruits daily, some vegetables   Relevant past medical, surgical, family and social history reviewed and updated as indicated. Interim medical history since our last visit reviewed. Allergies and medications reviewed and updated. Current Outpatient Prescriptions on File Prior to Visit  Medication Sig  . acetaminophen (TYLENOL) 500 MG tablet Take 500 mg by mouth every 6 (six) hours as needed.  .Marland KitchenamLODipine (NORVASC) 2.5 MG tablet Take 1 tablet (2.5 mg total) by mouth daily.  .Marland Kitchenaspirin 81 MG tablet Take 81 mg by mouth. Twice a week  . benzonatate (TESSALON) 100 MG capsule Take 1 capsule (100 mg total) by mouth 2 (two) times daily as needed for cough.  . Biotin 1000 MCG tablet Take 1,000 mcg by mouth daily.  . Calcium Carbonate-Vitamin D (CALCIUM 600+D HIGH POTENCY) 600-400 MG-UNIT per tablet Take 1 tablet by mouth daily.  . cholecalciferol (VITAMIN D) 1000 UNITS tablet Take 1,000 Units by mouth daily.  . diclofenac (FLECTOR) 1.3 % PTCH Place 1 patch onto the skin as needed.    . diphenhydrAMINE (BENADRYL) 25 mg capsule Take 25 mg by mouth at bedtime.   . fluticasone (FLONASE) 50 MCG/ACT nasal spray Place 2 sprays into the nose as needed.  . gabapentin (NEURONTIN) 100 MG capsule Take 200 mg by mouth at bedtime.  . gabapentin (NEURONTIN) 300 MG capsule Take 600 mg by mouth 3 (three) times daily.   . hyoscyamine (LEVSIN,  ANASPAZ) 0.125 MG tablet take 1 tablet by mouth AS NEEDED  . Lactobacillus Rhamnosus, GG, (CULTURELLE PO) Take 1 capsule by mouth daily.  Marland Kitchen loperamide (IMODIUM) 2 MG capsule Take 2 mg by mouth as needed for diarrhea or loose stools.  . Magnesium 250 MG TABS Take by mouth daily.  . meclizine (ANTIVERT) 25 MG tablet take 1 tablet by mouth three times a day if needed  . Multiple Vitamins-Minerals (CENTRUM SILVER PO) Take by mouth.    . nitroGLYCERIN (NITROSTAT) 0.4 MG SL tablet Place 0.4 mg  under the tongue every 5 (five) minutes as needed.  . pantoprazole (PROTONIX) 40 MG tablet take 1 tablet by mouth once daily  . Potassium Gluconate 595 MG CAPS Take 1 capsule by mouth daily.  . Tragacanth (ASTRAGALUS ROOT) POWD Take by mouth daily.    Marland Kitchen triamterene-hydrochlorothiazide (MAXZIDE-25) 37.5-25 MG per tablet take 1 tablet by mouth once daily  . vitamin B-12 (CYANOCOBALAMIN) 500 MCG tablet Take 1 tablet (500 mcg total) by mouth daily.   No current facility-administered medications on file prior to visit.    Review of Systems  Constitutional: Negative for fever, chills, activity change, appetite change, fatigue and unexpected weight change.  HENT: Negative for hearing loss.   Eyes: Negative for visual disturbance.  Respiratory: Positive for cough (dry). Negative for chest tightness, shortness of breath and wheezing.   Cardiovascular: Positive for leg swelling. Negative for chest pain and palpitations.  Gastrointestinal: Negative for nausea, vomiting, abdominal pain, diarrhea, constipation, blood in stool and abdominal distention.  Genitourinary: Negative for hematuria and difficulty urinating.  Musculoskeletal: Positive for joint swelling and arthralgias. Negative for myalgias and neck pain.  Skin: Negative for rash.  Neurological: Negative for dizziness, seizures, syncope and headaches.  Hematological: Negative for adenopathy. Does not bruise/bleed easily.  Psychiatric/Behavioral: Negative for dysphoric mood. The patient is not nervous/anxious.    Per HPI unless specifically indicated above     Objective:    BP 112/78 mmHg  Pulse 76  Temp(Src) 98 F (36.7 C) (Oral)  Ht 4' 11" (1.499 m)  Wt 130 lb 4 oz (59.081 kg)  BMI 26.29 kg/m2  Wt Readings from Last 3 Encounters:  10/16/14 130 lb 4 oz (59.081 kg)  08/07/14 129 lb 4 oz (58.627 kg)  04/11/14 130 lb 4 oz (59.081 kg)    Physical Exam  Constitutional: She is oriented to person, place, and time. She appears  well-developed and well-nourished. No distress.  HENT:  Head: Normocephalic and atraumatic.  Right Ear: Hearing, tympanic membrane, external ear and ear canal normal.  Left Ear: Hearing, tympanic membrane, external ear and ear canal normal.  Nose: Nose normal.  Mouth/Throat: Uvula is midline, oropharynx is clear and moist and mucous membranes are normal. No oropharyngeal exudate, posterior oropharyngeal edema or posterior oropharyngeal erythema.  Eyes: Conjunctivae and EOM are normal. Pupils are equal, round, and reactive to light. No scleral icterus.  Neck: Normal range of motion. Neck supple. Carotid bruit is not present. No thyromegaly present.  Cardiovascular: Normal rate, regular rhythm, normal heart sounds and intact distal pulses.   No murmur heard. Pulses:      Radial pulses are 2+ on the right side, and 2+ on the left side.  Pulmonary/Chest: Effort normal and breath sounds normal. No respiratory distress. She has no wheezes. She has no rales.  Beast - deferred, per GYN  Abdominal: Soft. Bowel sounds are normal. She exhibits no distension and no mass. There is no tenderness. There is no  rebound and no guarding.  Genitourinary:  GYN - deferred per GYN  Musculoskeletal: Normal range of motion. She exhibits no edema.  Lymphadenopathy:    She has no cervical adenopathy.  Neurological: She is alert and oriented to person, place, and time.  CN grossly intact, station and gait intact Recall 3/3 Calculation 5/5 serial 3s  Skin: Skin is warm and dry. No rash noted.  Psychiatric: She has a normal mood and affect. Her behavior is normal. Judgment and thought content normal.  Nursing note and vitals reviewed.  Results for orders placed or performed in visit on 10/06/14  Lipid panel  Result Value Ref Range   Cholesterol 196 0 - 200 mg/dL   Triglycerides 121.0 0.0 - 149.0 mg/dL   HDL 60.40 >39.00 mg/dL   VLDL 24.2 0.0 - 40.0 mg/dL   LDL Cholesterol 111 (H) 0 - 99 mg/dL   Total CHOL/HDL  Ratio 3    NonHDL 135.60   TSH  Result Value Ref Range   TSH 0.33 (L) 0.35 - 4.50 uIU/mL  Hemoglobin A1c  Result Value Ref Range   Hgb A1c MFr Bld 5.6 4.6 - 6.5 %  Basic metabolic panel  Result Value Ref Range   Sodium 136 135 - 145 mEq/L   Potassium 4.0 3.5 - 5.1 mEq/L   Chloride 99 96 - 112 mEq/L   CO2 29 19 - 32 mEq/L   Glucose, Bld 106 (H) 70 - 99 mg/dL   BUN 12 6 - 23 mg/dL   Creatinine, Ser 0.75 0.40 - 1.20 mg/dL   Calcium 9.7 8.4 - 10.5 mg/dL   GFR 79.38 >60.00 mL/min  T4, free  Result Value Ref Range   Free T4 1.17 0.60 - 1.60 ng/dL      Assessment & Plan:   Problem List Items Addressed This Visit    Advanced care planning/counseling discussion    Advanced directives: scanned and in chart. HCPOA is daughter then sons. Does not want prolonged measures.      Essential hypertension    Chronic, stable. Continue current regimen.      GAD (generalized anxiety disorder)    Increase wellbutrin to 154m bid per pt request.      GERD    protonix refilled today.      Health maintenance examination    Preventative protocols reviewed and updated unless pt declined. Discussed healthy diet and lifestyle.       Hereditary and idiopathic peripheral neuropathy    Followed by Dr NDossie Arbour On high dose gabapentin. ?related to increased falls but pt denies significant dizziness.      Relevant Medications   buPROPion (WELLBUTRIN) 100 MG tablet   HLD (hyperlipidemia)    Chronic, stable. Continue regimen.      Hypothyroidism    Mildly elevated thyroid function on recent tests - will decrease levothyroxine to 531m daily and recheck TFTs in 6 wks.      Relevant Medications   levothyroxine (SYNTHROID, LEVOTHROID) 50 MCG tablet   Medicare annual wellness visit, subsequent - Primary    I have personally reviewed the Medicare Annual Wellness questionnaire and have noted 1. The patient's medical and social history 2. Their use of alcohol, tobacco or illicit drugs 3. Their  current medications and supplements 4. The patient's functional ability including ADL's, fall risks, home safety risks and hearing or visual impairment. Cognitive function has been assessed and addressed as indicated.  5. Diet and physical activity 6. Evidence for depression or mood disorders The patients weight,  height, BMI have been recorded in the chart. I have made referrals, counseling and provided education to the patient based on review of the above and I have provided the pt with a written personalized care plan for preventive services. Provider list updated.. See scanned questionairre as needed for further documentation. Reviewed preventative protocols and updated unless pt declined.       Night sweats    Unclear etiology - start with CBC,LFT,ESR today. ?overtreated hypothyroid - will assess on lower levothyroxine dose.      Relevant Orders   CBC with Differential/Platelet   Hepatic function panel   Sedimentation rate   Osteopenia   Recurrent falls    Pt agrees to PT referral for gait assessment, balance training and fall prevention program. Would appreciate eval for ambulatory assistive device as well.      Relevant Orders   Ambulatory referral to Physical Therapy       Follow up plan: Return in about 6 months (around 04/18/2015), or as needed, for follow up visit.

## 2014-10-16 NOTE — Assessment & Plan Note (Signed)
Increase wellbutrin to 100mg  bid per pt request.

## 2014-10-16 NOTE — Assessment & Plan Note (Signed)
Preventative protocols reviewed and updated unless pt declined. Discussed healthy diet and lifestyle.  

## 2014-10-16 NOTE — Assessment & Plan Note (Signed)

## 2014-10-16 NOTE — Assessment & Plan Note (Signed)
Mildly elevated thyroid function on recent tests - will decrease levothyroxine to 57mcg daily and recheck TFTs in 6 wks.

## 2014-10-16 NOTE — Patient Instructions (Addendum)
Let's increase wellbutrin to 100mg  twice daily. Thyroid was slightly high - decrease to 82mcg once daily and recheck levels in 6 weeks.  Blood work today. Pass by Marion's office for referral to physical therapy. Return as needed or in 6 months for follow up visit

## 2014-10-16 NOTE — Assessment & Plan Note (Signed)
Advanced directives: scanned and in chart. HCPOA is daughter then sons. Does not want prolonged measures.

## 2014-10-16 NOTE — Assessment & Plan Note (Signed)
Chronic, stable. Continue regimen. 

## 2014-10-16 NOTE — Assessment & Plan Note (Signed)
Pt agrees to PT referral for gait assessment, balance training and fall prevention program. Would appreciate eval for ambulatory assistive device as well.

## 2014-10-16 NOTE — Assessment & Plan Note (Signed)
protonix refilled today.

## 2014-10-16 NOTE — Progress Notes (Signed)
Pre visit review using our clinic review tool, if applicable. No additional management support is needed unless otherwise documented below in the visit note. 

## 2014-10-16 NOTE — Assessment & Plan Note (Signed)
Chronic, stable. Continue current regimen. 

## 2014-10-16 NOTE — Assessment & Plan Note (Signed)
Unclear etiology - start with CBC,LFT,ESR today. ?overtreated hypothyroid - will assess on lower levothyroxine dose.

## 2014-10-16 NOTE — Assessment & Plan Note (Signed)
Followed by Dr [name]. On high dose gabapentin. ?related to increased falls but pt denies significant dizziness.

## 2014-10-17 ENCOUNTER — Encounter: Payer: Self-pay | Admitting: *Deleted

## 2014-10-23 ENCOUNTER — Ambulatory Visit: Payer: Medicare Other | Attending: Family Medicine | Admitting: Physical Therapy

## 2014-10-23 ENCOUNTER — Encounter: Payer: Self-pay | Admitting: Physical Therapy

## 2014-10-23 DIAGNOSIS — R262 Difficulty in walking, not elsewhere classified: Secondary | ICD-10-CM | POA: Diagnosis not present

## 2014-10-23 DIAGNOSIS — M545 Low back pain, unspecified: Secondary | ICD-10-CM

## 2014-10-23 NOTE — Therapy (Signed)
[place] MAIN [facility] SERVICES [address] [place], [place], [address] Phone: [phone]   Fax:  [phone]  Physical Therapy Evaluation  Patient Details  Name: Cheyenne Pierce MRN: [id number] Date of Birth: 1935-02-13 Referring Provider:  Ria Bush, MD  Encounter Date: 10/23/2014    Past Medical History  Diagnosis Date  . Allergy   . Anxiety   . Hypertension   . Osteopenia     DEXA 2013 T -1.0  . IBS (irritable bowel syndrome)   . Hypothyroidism   . GERD (gastroesophageal reflux disease)   . Gastritis   . Peripheral neuropathy     in feet, ESI have helped [name])  . DDD (degenerative disc disease), cervical     moderate to severe multilevel cervical spine DDD, most severe central stenosis C5/6, significant R foraminal stenosis ([name])  . Chronic vulvitis   . Menopause   . History of vaginal dysplasia   . S/P TAH-BSO (total abdominal hysterectomy and bilateral salpingo-oophorectomy)   . Angina pectoris   . Vaginal atrophy   . Chronic constipation   . IBS (irritable bowel syndrome)   . Mass of vagina     referral to [facility] 03/2014  . Abdominal pain, RLQ (right lower quadrant)     referral to GI-Dr. Pat Patrick    Past Surgical History  Procedure Laterality Date  . Appendectomy  1948  . Abdominal hysterectomy  1968    left ovary remains  . Ovarian cyst surgery  1963    cyst on ovaries  . Cardiac catheterization  09/26/1998  . Hernia repair    . Colostomy w/ rectocele repair  06/2004  . Ventral hernia repair  08/2008    abdominal wall, lysis of adhesions  . Colonoscopy  07/2009    [facility] normal per pt  . Epidural steroid injections  multiple latest 05/2014, 07/2014    help periph neuropathy [name])  . US echocardiography  2009    mild aortic/mitral insuff, ER 73%, mild diastolic dysfunction  . Nm myoview ltd  2009    WNL per report  . Dexa  2013    T -1.0  . Colonoscopy  06/2014    int  hem, rpt 5 yrs ([name])    There were no vitals filed for this visit.  Visit Diagnosis:  No diagnosis found.      Subjective Assessment - 10/23/14 1307    Subjective Patient reports back pain and falls.   Currently in Pain? Yes   Pain Score 8    Pain Location Leg   Pain Orientation Right;Left            [facility] PT Assessment - 10/23/14 1308    Assessment   Medical Diagnosis low back pain   Onset Date/Surgical Date 04/07/05   Hand Dominance Right   Next MD Visit 04/08/15   Prior Therapy no   Precautions   Precautions Fall   Balance Screen   Has the patient fallen in the past 6 months Yes   How many times? 2   Has the patient had a decrease in activity level because of a fear of falling?  Yes   Is the patient reluctant to leave their home because of a fear of falling?  No   Home Social worker Private residence   Living Arrangements Spouse/significant other   Available Help at Discharge Friend(s)   Type of [place] to enter  Entrance Stairs-Number of Steps 5   Home Layout One level   Home Equipment None   Prior Function   Level of Independence Independent   Vocation Retired       Falls risk: 5 X sit to stand 48.72 10 MW= .46 m/sec TUG 21.70 Strength of LLE is 3/35, RLE is 4/5 Sensation is intact ROM is WFL Mobility: Transfers: sit to stand is guarded and slow with pain during movement Gait : Antalgic slow gait speed .46 m/sec with limp  Patient has falls risk indicated with 5 x sit to stand and decreased gait speed.  Patient will benefit from a SW to improve gait quality and decrease falls risk.                                 Problem List Patient Active Problem List   Diagnosis Date Noted  . Health maintenance examination 10/16/2014  . Recurrent falls 10/16/2014  . Night sweats 10/16/2014  . HLD (hyperlipidemia) 10/06/2014  . Left leg injury 08/07/2014  . Right hip pain 04/11/2014   . DDD (degenerative disc disease), cervical   . Advanced care planning/counseling discussion 10/11/2013  . Hair thinning 09/16/2013  . Memory deficit 05/13/2013  . Mood swings 04/15/2013  . Dizziness 04/14/2013  . Headache 11/15/2012  . Prediabetes 09/21/2012  . Bilateral leg pain 09/15/2012  . Mid back pain 05/13/2012  . PVCs (premature ventricular contractions) 04/10/2012  . Medicare annual wellness visit, subsequent 01/11/2012  . Chest pain 11/15/2011  . Systolic murmur 09/31/1216  . Frontal sinus pain 07/28/2011  . OTHER CHRONIC CYSTITIS 06/07/2009  . CONSTIPATION 03/05/2009  . DIARRHEA 03/05/2009  . INGUINAL HERNIA, LEFT 03/07/2008  . Hereditary and idiopathic peripheral neuropathy 09/28/2007  . GERD 08/16/2007  . Irritable bowel syndrome 08/16/2007  . Hypothyroidism 02/23/2007  . GAD (generalized anxiety disorder) 02/23/2007  . Essential hypertension 02/23/2007  . ALLERGIC RHINITIS 02/23/2007  . REFLUX ESOPHAGITIS 02/23/2007  . Osteopenia 02/23/2007    Arelia Sneddon S 10/23/2014, 1:12 PM  [place] MAIN [facility] SERVICES [address] [place], [place], [address] Phone: [phone]   Fax:  [phone]

## 2014-10-25 ENCOUNTER — Encounter: Payer: Medicare Other | Admitting: Physical Therapy

## 2014-10-30 ENCOUNTER — Encounter: Payer: Medicare Other | Admitting: Physical Therapy

## 2014-10-30 ENCOUNTER — Telehealth: Payer: Self-pay | Admitting: Family Medicine

## 2014-10-30 NOTE — Telephone Encounter (Signed)
Pt called stated Cheyenne Pierce @ [facility] was going send a recommendation for a walker.  Cheyenne Pierce wanted to make sure dr G received this  She wanted to know when she could get her rx

## 2014-11-01 ENCOUNTER — Encounter: Payer: Medicare Other | Admitting: Physical Therapy

## 2014-11-07 ENCOUNTER — Telehealth: Payer: Self-pay | Admitting: Family Medicine

## 2014-11-07 NOTE — Telephone Encounter (Signed)
Physical therapist called in , requesting an order for a rolling walker.  Physical therapist said that pt will come by to pick up order.

## 2014-11-07 NOTE — Telephone Encounter (Signed)
Needs order for rolling walker due to distance from parking lot to PT. Please call patient when ready.

## 2014-11-07 NOTE — Telephone Encounter (Signed)
Spoke with patient and advised her that we never received a request for a walker. Left message for Altha Harm to refax request [facility] [phone]).

## 2014-11-07 NOTE — Telephone Encounter (Signed)
Forwarded previous phone note to Dr. Darnell Level with request.

## 2014-11-08 NOTE — Telephone Encounter (Signed)
Patient notified and Rx placed up front for pick up. 

## 2014-11-08 NOTE — Telephone Encounter (Signed)
Filled and in Kim's box. 

## 2014-11-24 ENCOUNTER — Telehealth: Payer: Self-pay | Admitting: Family Medicine

## 2014-11-24 NOTE — Telephone Encounter (Signed)
Pt is requesting a form be filled out for a handicap card.  Right now, she is using her friend's handicap card and states that the numbers do not match up since she drives a different vehicle.  She didn't have a form to drop off and srtates that we can obtain a form for her. Pt will be in the office for labs on Monday at noon and would like to pick up form then. Best cb number is [phone] or [phone].

## 2014-11-24 NOTE — Telephone Encounter (Signed)
In your IN box for completion.  

## 2014-11-25 NOTE — Telephone Encounter (Signed)
Filled and in Kim's box. 

## 2014-11-27 ENCOUNTER — Other Ambulatory Visit (INDEPENDENT_AMBULATORY_CARE_PROVIDER_SITE_OTHER): Payer: Medicare Other

## 2014-11-27 DIAGNOSIS — E038 Other specified hypothyroidism: Secondary | ICD-10-CM | POA: Diagnosis not present

## 2014-11-27 LAB — TSH: TSH: 15.4 u[IU]/mL — ABNORMAL HIGH (ref 0.35–4.50)

## 2014-11-27 NOTE — Telephone Encounter (Signed)
Form given to patient at lab appt.

## 2014-11-28 ENCOUNTER — Encounter: Payer: Self-pay | Admitting: Family Medicine

## 2014-11-29 ENCOUNTER — Other Ambulatory Visit: Payer: Self-pay | Admitting: Family Medicine

## 2014-11-29 DIAGNOSIS — E039 Hypothyroidism, unspecified: Secondary | ICD-10-CM

## 2014-11-29 MED ORDER — LEVOTHYROXINE SODIUM 50 MCG PO TABS: 50.0000 ug | ORAL_TABLET | Freq: Every day | ORAL | Status: DC

## 2014-12-13 ENCOUNTER — Other Ambulatory Visit: Payer: Self-pay | Admitting: Otolaryngology

## 2014-12-13 DIAGNOSIS — F458 Other somatoform disorders: Secondary | ICD-10-CM

## 2014-12-14 ENCOUNTER — Ambulatory Visit: Payer: Medicare Other | Admitting: Family Medicine

## 2014-12-14 ENCOUNTER — Telehealth: Payer: Self-pay | Admitting: Family Medicine

## 2014-12-14 NOTE — Telephone Encounter (Signed)
No f/u needed. May have seen ENT?

## 2014-12-14 NOTE — Telephone Encounter (Signed)
Pt did not come in for their appt today for acute visit. Please let me know if pt needs to be contacted immediately for follow up or no follow up needed. Best phone number to contact pt is [phone].

## 2014-12-18 ENCOUNTER — Other Ambulatory Visit: Payer: Self-pay | Admitting: Family Medicine

## 2014-12-20 ENCOUNTER — Ambulatory Visit
Admission: RE | Admit: 2014-12-20 | Discharge: 2014-12-20 | Disposition: A | Discharge status: Home / Self Care | Payer: Medicare Other | Source: Ambulatory Visit | Attending: Otolaryngology | Admitting: Otolaryngology

## 2014-12-20 DIAGNOSIS — F458 Other somatoform disorders: Secondary | ICD-10-CM | POA: Diagnosis present

## 2014-12-26 ENCOUNTER — Ambulatory Visit (INDEPENDENT_AMBULATORY_CARE_PROVIDER_SITE_OTHER): Payer: Medicare Other

## 2014-12-26 DIAGNOSIS — Z23 Encounter for immunization: Secondary | ICD-10-CM

## 2015-01-14 ENCOUNTER — Other Ambulatory Visit: Payer: Self-pay | Admitting: Family Medicine

## 2015-01-24 ENCOUNTER — Ambulatory Visit: Payer: Medicare Other | Admitting: Pain Medicine

## 2015-01-30 ENCOUNTER — Other Ambulatory Visit (INDEPENDENT_AMBULATORY_CARE_PROVIDER_SITE_OTHER): Payer: Medicare Other

## 2015-01-30 DIAGNOSIS — E039 Hypothyroidism, unspecified: Secondary | ICD-10-CM | POA: Diagnosis not present

## 2015-01-30 LAB — TSH: TSH: 4.36 u[IU]/mL (ref 0.35–4.50)

## 2015-02-01 ENCOUNTER — Encounter: Payer: Self-pay | Admitting: *Deleted

## 2015-02-07 ENCOUNTER — Encounter: Payer: Self-pay | Admitting: Primary Care

## 2015-02-07 ENCOUNTER — Ambulatory Visit (INDEPENDENT_AMBULATORY_CARE_PROVIDER_SITE_OTHER): Payer: Medicare Other | Admitting: Primary Care

## 2015-02-07 VITALS — BP 124/76 | HR 78 | Temp 98.0°F | Ht 59.0 in | Wt 129.8 lb

## 2015-02-07 DIAGNOSIS — R197 Diarrhea, unspecified: Secondary | ICD-10-CM | POA: Diagnosis not present

## 2015-02-07 DIAGNOSIS — R1032 Left lower quadrant pain: Secondary | ICD-10-CM

## 2015-02-07 LAB — CBC WITH DIFFERENTIAL/PLATELET
BASOS PCT: 0.7 % (ref 0.0–3.0)
Basophils Absolute: 0 10*3/uL (ref 0.0–0.1)
EOS PCT: 1.5 % (ref 0.0–5.0)
Eosinophils Absolute: 0.1 10*3/uL (ref 0.0–0.7)
HEMATOCRIT: 39.4 % (ref 36.0–46.0)
HEMOGLOBIN: 13.1 g/dL (ref 12.0–15.0)
LYMPHS PCT: 31.7 % (ref 12.0–46.0)
Lymphs Abs: 1.3 10*3/uL (ref 0.7–4.0)
MCHC: 33.2 g/dL (ref 30.0–36.0)
MCV: 92.1 fl (ref 78.0–100.0)
MONO ABS: 0.5 10*3/uL (ref 0.1–1.0)
MONOS PCT: 12.5 % — AB (ref 3.0–12.0)
Neutro Abs: 2.1 10*3/uL (ref 1.4–7.7)
Neutrophils Relative %: 53.6 % (ref 43.0–77.0)
Platelets: 319 10*3/uL (ref 150.0–400.0)
RBC: 4.28 Mil/uL (ref 3.87–5.11)
RDW: 14.2 % (ref 11.5–15.5)
WBC: 4 10*3/uL (ref 4.0–10.5)

## 2015-02-07 LAB — COMPREHENSIVE METABOLIC PANEL
ALBUMIN: 4 g/dL (ref 3.5–5.2)
ALK PHOS: 55 U/L (ref 39–117)
ALT: 21 U/L (ref 0–35)
AST: 27 U/L (ref 0–37)
BUN: 6 mg/dL (ref 6–23)
CALCIUM: 9.6 mg/dL (ref 8.4–10.5)
CO2: 30 mEq/L (ref 19–32)
CREATININE: 0.73 mg/dL (ref 0.40–1.20)
Chloride: 96 mEq/L (ref 96–112)
GFR: 81.83 mL/min (ref >60.00)
Glucose, Bld: 88 mg/dL (ref 70–99)
POTASSIUM: 3.6 meq/L (ref 3.5–5.1)
SODIUM: 134 meq/L — AB (ref 135–145)
TOTAL PROTEIN: 7.2 g/dL (ref 6.0–8.3)
Total Bilirubin: 0.4 mg/dL (ref 0.2–1.2)

## 2015-02-07 NOTE — Progress Notes (Signed)
Subjective:    Patient ID: Cheyenne Pierce, female    DOB: 10-06-1934, 80 y.o.   MRN: [id number]  HPI  Ms. Sojourner is a 80 year old female who presents today with a chief complaint of diarrhea. Her diarrhea has been present since Sunday night. She felt sluggish and "bad" all day Sunday afternoon. She has a history of IBS. Her diarrhea has been dark and greenish color on Sunday and Monday, and then was yellowish yesterday. She also reports symptoms of fatigue and abdominal discomfort today that began after her diarrhea began. She was having 2-3 episodes of diarrhea daily for the past 2 days. Overall her diarrhea and abdominal discomfort has improved since Sunday. She's had no diarrhea today. She's been taking imodium since yesterday. Denies fevers, chills, vomiting. She's eating applesauce and canned pears, crackers, and drinking coke without vomiting.   Review of Systems  Constitutional: Positive for fatigue. Negative for fever and chills.  Respiratory: Negative for shortness of breath.   Cardiovascular: Negative for chest pain.  Gastrointestinal: Positive for nausea, abdominal pain and diarrhea. Negative for vomiting and blood in stool.       Past Medical History  Diagnosis Date  . Allergy   . Anxiety   . Hypertension   . Osteopenia     DEXA 2013 T -1.0  . IBS (irritable bowel syndrome)   . Hypothyroidism   . GERD (gastroesophageal reflux disease)   . Gastritis   . Peripheral neuropathy (HCC)     in feet, ESI have helped [name])  . DDD (degenerative disc disease), cervical     moderate to severe multilevel cervical spine DDD, most severe central stenosis C5/6, significant R foraminal stenosis ([name])  . Chronic vulvitis   . Menopause   . History of vaginal dysplasia   . S/P TAH-BSO (total abdominal hysterectomy and bilateral salpingo-oophorectomy)   . Angina pectoris ([place])   . Vaginal atrophy   . Chronic constipation   . IBS (irritable bowel syndrome)   . Mass of vagina      referral to [facility] 03/2014  . Abdominal pain, RLQ (right lower quadrant)     referral to GI-Dr. Pat Patrick  . DDD (degenerative disc disease), lumbar     multilevel degenerative changes mostly L3/4 with mod spinal canal and mild bilat neural foraminal stenosis, moderate R neural froaminal narrowing L4/5 [name])    Social History   Social History  . Marital Status: Widowed    Spouse Name: N/A  . Number of Children: 5  . Years of Education: N/A   Occupational History  . Not on file.   Social History Main Topics  . Smoking status: Former Smoker -- 1.00 packs/day for 30 years    Quit date: 05/19/1978  . Smokeless tobacco: Never Used  . Alcohol Use: No  . Drug Use: No  . Sexual Activity: Not on file   Other Topics Concern  . Not on file   Social History Narrative   Daily caffeine use 6 cups every day   Lives with husband   Grown children nearby   Activity: no regular exercise   Diet: good water, fruits daily, some vegetables    Past Surgical History  Procedure Laterality Date  . Appendectomy  1948  . Abdominal hysterectomy  1968    left ovary remains  . Ovarian cyst surgery  1963    cyst on ovaries  . Cardiac catheterization  09/26/1998  . Hernia repair    . Colostomy w/  rectocele repair  06/2004  . Ventral hernia repair  08/2008    abdominal wall, lysis of adhesions  . Colonoscopy  07/2009    [facility]), normal per pt  . Epidural steroid injections  multiple latest 05/2014, 07/2014    help periph neuropathy [name])  . US echocardiography  2009    mild aortic/mitral insuff, ER 27%, mild diastolic dysfunction  . Nm myoview ltd  2009    WNL per report  . Dexa  2013    T -1.0  . Colonoscopy  06/2014    int hem, rpt 5 yrs Allen Norris)    Family History  Problem Relation Age of Onset  . Heart disease Mother   . Stroke Brother     2 strokes  . Hypertension Neg Hx   . Diabetes Neg Hx   . Cancer Neg Hx     breast or colon cancer, no history     Allergies  Allergen Reactions  . Zoloft [Sertraline Hcl] Rash    Oral blisters and skin rash  . Amoxicillin     REACTION: diarrhea  . Codeine Phosphate     REACTION: nausea  . Metronidazole     REACTION: Rash, tongue ulcers  . Oxcarbazepine     REACTION: rash  . Perindopril Erbumine     REACTION: unspecified  . Potassium-Containing Compounds Other (See Comments)    abd pain with oral potassium pills, h/o ulcers  . Prednisone     REACTION: swelling  . Prozac [Fluoxetine Hcl] Other (See Comments)    Trouble sleeping, night sweats, rapid hear beat  . Tramadol Hcl     REACTION: diarrhea    Current Outpatient Prescriptions on File Prior to Visit  Medication Sig Dispense Refill  . acetaminophen (TYLENOL) 500 MG tablet Take 500 mg by mouth every 6 (six) hours as needed.    Marland Kitchen amLODipine (NORVASC) 2.5 MG tablet Take 1 tablet (2.5 mg total) by mouth daily. 30 tablet 11  . aspirin 81 MG tablet Take 81 mg by mouth. Twice a week    . benzonatate (TESSALON) 100 MG capsule take 1 capsule by mouth twice a day if needed for cough 30 capsule 0  . Biotin 1000 MCG tablet Take 1,000 mcg by mouth daily.    Marland Kitchen buPROPion (WELLBUTRIN) 100 MG tablet Take 1 tablet (100 mg total) by mouth 2 (two) times daily. 60 tablet 11  . Calcium Carbonate-Vitamin D (CALCIUM 600+D HIGH POTENCY) 600-400 MG-UNIT per tablet Take 1 tablet by mouth daily.    . cholecalciferol (VITAMIN D) 1000 UNITS tablet Take 1,000 Units by mouth daily.    . diclofenac (FLECTOR) 1.3 % PTCH Place 1 patch onto the skin as needed.      . diphenhydrAMINE (BENADRYL) 25 mg capsule Take 25 mg by mouth at bedtime.     . fluticasone (FLONASE) 50 MCG/ACT nasal spray Place 2 sprays into the nose as needed.    . gabapentin (NEURONTIN) 100 MG capsule Take 200 mg by mouth at bedtime.    . gabapentin (NEURONTIN) 300 MG capsule Take 600 mg by mouth 3 (three) times daily.     . hyoscyamine (LEVSIN, ANASPAZ) 0.125 MG tablet take 1 tablet by mouth AS  NEEDED 30 tablet 3  . Lactobacillus Rhamnosus, GG, (CULTURELLE PO) Take 1 capsule by mouth daily.    Marland Kitchen levothyroxine (SYNTHROID, LEVOTHROID) 50 MCG tablet Take 1 tablet (50 mcg total) by mouth daily. Take extra tablet on Mon/Friday 110 tablet 3  . loperamide (  IMODIUM) 2 MG capsule Take 2 mg by mouth as needed for diarrhea or loose stools.    . Magnesium 250 MG TABS Take by mouth daily.    . meclizine (ANTIVERT) 25 MG tablet take 1 tablet by mouth three times a day if needed 30 tablet 1  . Multiple Vitamins-Minerals (CENTRUM SILVER PO) Take by mouth.      . nitroGLYCERIN (NITROSTAT) 0.4 MG SL tablet Place 0.4 mg under the tongue every 5 (five) minutes as needed.    . pantoprazole (PROTONIX) 40 MG tablet Take 1 tablet (40 mg total) by mouth daily. 90 tablet 3  . Potassium Gluconate 595 MG CAPS Take 1 capsule by mouth daily.    . Tragacanth (ASTRAGALUS ROOT) POWD Take by mouth daily.      Marland Kitchen triamterene-hydrochlorothiazide (MAXZIDE-25) 37.5-25 MG tablet take 1 tablet by mouth once daily 30 tablet 11  . vitamin B-12 (CYANOCOBALAMIN) 500 MCG tablet Take 1 tablet (500 mcg total) by mouth daily.     No current facility-administered medications on file prior to visit.    BP 124/76 mmHg  Pulse 78  Temp(Src) 98 F (36.7 C) (Oral)  Ht 4\' 11"  (1.499 m)  Wt 129 lb 12.8 oz (58.877 kg)  BMI 26.20 kg/m2  SpO2 98%    Objective:   Physical Exam  Constitutional: She appears well-nourished. She does not appear ill.  Cardiovascular: Normal rate and regular rhythm.   Pulmonary/Chest: Effort normal and breath sounds normal.  Abdominal: Soft. Bowel sounds are normal. There is tenderness in the left lower quadrant.  Skin: Skin is warm and dry.          Assessment & Plan:  Diarrhea:  Present 2-3 episodes daily since Sunday. No diarrhea today. Suspect viral involvement based off of HPI, exam, and vitals. She's well hydrated and does not appear sickly. She does have LLQ tenderness upon exam,  question diverticulitis vs viral process, however no bloody/mucousy stools, and no evidence of diverticulitis on recent colonoscopy in March 2016. CBC and CMP are unremarkable today which is reassuring. Discussed continuing Imodium for the next 2-3 days, stay hydrated with water, and handout provided with bland diet for diarrhea. She is to notify me Monday if no improvement.

## 2015-02-07 NOTE — Patient Instructions (Signed)
Complete lab work prior to leaving today. I will notify you of your results.  Continue Imodium for the next 2-3 days.  Advance diet as tolerated.  Please call me if you develop fevers, increased abdominal pain, and/or your diarrhea does not improve.  It was a pleasure meeting you!  Food Choices to Help Relieve Diarrhea, Adult When you have diarrhea, the foods you eat and your eating habits are very important. Choosing the right foods and drinks can help relieve diarrhea. Also, because diarrhea can last up to 7 days, you need to replace lost fluids and electrolytes (such as sodium, potassium, and chloride) in order to help prevent dehydration.  WHAT GENERAL GUIDELINES DO I NEED TO FOLLOW?  Slowly drink 1 cup (8 oz) of fluid for each episode of diarrhea. If you are getting enough fluid, your urine will be clear or pale yellow.  Eat starchy foods. Some good choices include white rice, white toast, pasta, low-fiber cereal, baked potatoes (without the skin), saltine crackers, and bagels.  Avoid large servings of any cooked vegetables.  Limit fruit to two servings per day. A serving is  cup or 1 small piece.  Choose foods with less than 2 g of fiber per serving.  Limit fats to less than 8 tsp (38 g) per day.  Avoid fried foods.  Eat foods that have probiotics in them. Probiotics can be found in certain dairy products.  Avoid foods and beverages that may increase the speed at which food moves through the stomach and intestines (gastrointestinal tract). Things to avoid include:  High-fiber foods, such as dried fruit, raw fruits and vegetables, nuts, seeds, and whole grain foods.  Spicy foods and high-fat foods.  Foods and beverages sweetened with high-fructose corn syrup, honey, or sugar alcohols such as xylitol, sorbitol, and mannitol. WHAT FOODS ARE RECOMMENDED? Grains White rice. White, [place], or pita breads (fresh or toasted), including plain rolls, buns, or bagels. White pasta.  Saltine, soda, or graham crackers. Pretzels. Low-fiber cereal. Cooked cereals made with water (such as cornmeal, farina, or cream cereals). Plain muffins. Matzo. Melba toast. Zwieback.  Vegetables Potatoes (without the skin). Strained tomato and vegetable juices. Most well-cooked and canned vegetables without seeds. Tender lettuce. Fruits Cooked or canned applesauce, apricots, cherries, fruit cocktail, grapefruit, peaches, pears, or plums. Fresh bananas, apples without skin, cherries, grapes, cantaloupe, grapefruit, peaches, oranges, or plums.  Meat and Other Protein Products Baked or boiled chicken. Eggs. Tofu. Fish. Seafood. Smooth peanut butter. Ground or well-cooked tender beef, ham, veal, lamb, pork, or poultry.  Dairy Plain yogurt, kefir, and unsweetened liquid yogurt. Lactose-free milk, buttermilk, or soy milk. Plain hard cheese. Beverages Sport drinks. Clear broths. Diluted fruit juices (except prune). Regular, caffeine-free sodas such as ginger ale. Water. Decaffeinated teas. Oral rehydration solutions. Sugar-free beverages not sweetened with sugar alcohols. Other Bouillon, broth, or soups made from recommended foods.  The items listed above may not be a complete list of recommended foods or beverages. Contact your dietitian for more options. WHAT FOODS ARE NOT RECOMMENDED? Grains Whole grain, whole wheat, bran, or rye breads, rolls, pastas, crackers, and cereals. Wild or brown rice. Cereals that contain more than 2 g of fiber per serving. Corn tortillas or taco shells. Cooked or dry oatmeal. Granola. Popcorn. Vegetables Raw vegetables. Cabbage, broccoli, Brussels sprouts, artichokes, baked beans, beet greens, corn, kale, legumes, peas, sweet potatoes, and yams. Potato skins. Cooked spinach and cabbage. Fruits Dried fruit, including raisins and dates. Raw fruits. Stewed or dried prunes. Fresh apples  with skin, apricots, mangoes, pears, raspberries, and strawberries.  Meat and Other  Protein Products Chunky peanut butter. Nuts and seeds. Beans and lentils. Berniece Salines.  Dairy High-fat cheeses. Milk, chocolate milk, and beverages made with milk, such as milk shakes. Cream. Ice cream. Sweets and Desserts Sweet rolls, doughnuts, and sweet breads. Pancakes and waffles. Fats and Oils Butter. Cream sauces. Margarine. Salad oils. Plain salad dressings. Olives. Avocados.  Beverages Caffeinated beverages (such as coffee, tea, soda, or energy drinks). Alcoholic beverages. Fruit juices with pulp. Prune juice. Soft drinks sweetened with high-fructose corn syrup or sugar alcohols. Other Coconut. Hot sauce. Chili powder. Mayonnaise. Gravy. Cream-based or milk-based soups.  The items listed above may not be a complete list of foods and beverages to avoid. Contact your dietitian for more information. WHAT SHOULD I DO IF I BECOME DEHYDRATED? Diarrhea can sometimes lead to dehydration. Signs of dehydration include dark urine and dry mouth and skin. If you think you are dehydrated, you should rehydrate with an oral rehydration solution. These solutions can be purchased at pharmacies, retail stores, or online.  Drink -1 cup (120-240 mL) of oral rehydration solution each time you have an episode of diarrhea. If drinking this amount makes your diarrhea worse, try drinking smaller amounts more often. For example, drink 1-3 tsp (5-15 mL) every 5-10 minutes.  A general rule for staying hydrated is to drink 1-2 L of fluid per day. Talk to your health care provider about the specific amount you should be drinking each day. Drink enough fluids to keep your urine clear or pale yellow.   This information is not intended to replace advice given to you by your health care provider. Make sure you discuss any questions you have with your health care provider.   Document Released: 06/14/2003 Document Revised: 04/14/2014 Document Reviewed: 02/14/2013 Elsevier Interactive Patient Education [employer].

## 2015-02-07 NOTE — Progress Notes (Signed)
Pre visit review using our clinic review tool, if applicable. No additional management support is needed unless otherwise documented below in the visit note. 

## 2015-03-19 ENCOUNTER — Encounter: Payer: Self-pay | Admitting: Pain Medicine

## 2015-03-19 ENCOUNTER — Ambulatory Visit: Payer: Medicare Other | Attending: Pain Medicine | Admitting: Pain Medicine

## 2015-03-19 ENCOUNTER — Other Ambulatory Visit: Payer: Self-pay | Admitting: Pain Medicine

## 2015-03-19 VITALS — BP 140/89 | HR 80 | Temp 97.9°F | Resp 18 | Ht 60.0 in | Wt 125.0 lb

## 2015-03-19 DIAGNOSIS — E785 Hyperlipidemia, unspecified: Secondary | ICD-10-CM | POA: Diagnosis not present

## 2015-03-19 DIAGNOSIS — G8929 Other chronic pain: Secondary | ICD-10-CM

## 2015-03-19 DIAGNOSIS — M5030 Other cervical disc degeneration, unspecified cervical region: Secondary | ICD-10-CM | POA: Diagnosis not present

## 2015-03-19 DIAGNOSIS — R52 Pain, unspecified: Secondary | ICD-10-CM

## 2015-03-19 DIAGNOSIS — Z79891 Long term (current) use of opiate analgesic: Secondary | ICD-10-CM | POA: Diagnosis not present

## 2015-03-19 DIAGNOSIS — I10 Essential (primary) hypertension: Secondary | ICD-10-CM | POA: Diagnosis not present

## 2015-03-19 DIAGNOSIS — Z79899 Other long term (current) drug therapy: Secondary | ICD-10-CM

## 2015-03-19 DIAGNOSIS — M5416 Radiculopathy, lumbar region: Secondary | ICD-10-CM

## 2015-03-19 DIAGNOSIS — E039 Hypothyroidism, unspecified: Secondary | ICD-10-CM | POA: Insufficient documentation

## 2015-03-19 DIAGNOSIS — M5442 Lumbago with sciatica, left side: Secondary | ICD-10-CM

## 2015-03-19 DIAGNOSIS — M542 Cervicalgia: Secondary | ICD-10-CM

## 2015-03-19 DIAGNOSIS — R531 Weakness: Secondary | ICD-10-CM | POA: Insufficient documentation

## 2015-03-19 DIAGNOSIS — M25519 Pain in unspecified shoulder: Secondary | ICD-10-CM | POA: Insufficient documentation

## 2015-03-19 DIAGNOSIS — M545 Low back pain, unspecified: Secondary | ICD-10-CM

## 2015-03-19 DIAGNOSIS — F411 Generalized anxiety disorder: Secondary | ICD-10-CM | POA: Diagnosis not present

## 2015-03-19 DIAGNOSIS — R208 Other disturbances of skin sensation: Secondary | ICD-10-CM | POA: Insufficient documentation

## 2015-03-19 DIAGNOSIS — Z5181 Encounter for therapeutic drug level monitoring: Secondary | ICD-10-CM

## 2015-03-19 DIAGNOSIS — F1190 Opioid use, unspecified, uncomplicated: Secondary | ICD-10-CM | POA: Diagnosis not present

## 2015-03-19 DIAGNOSIS — K589 Irritable bowel syndrome without diarrhea: Secondary | ICD-10-CM | POA: Insufficient documentation

## 2015-03-19 DIAGNOSIS — R7303 Prediabetes: Secondary | ICD-10-CM | POA: Diagnosis not present

## 2015-03-19 DIAGNOSIS — M47812 Spondylosis without myelopathy or radiculopathy, cervical region: Secondary | ICD-10-CM | POA: Diagnosis not present

## 2015-03-19 DIAGNOSIS — G8911 Acute pain due to trauma: Secondary | ICD-10-CM

## 2015-03-19 DIAGNOSIS — R200 Anesthesia of skin: Secondary | ICD-10-CM

## 2015-03-19 DIAGNOSIS — M8580 Other specified disorders of bone density and structure, unspecified site: Secondary | ICD-10-CM | POA: Diagnosis not present

## 2015-03-19 DIAGNOSIS — M549 Dorsalgia, unspecified: Secondary | ICD-10-CM | POA: Insufficient documentation

## 2015-03-19 DIAGNOSIS — M5412 Radiculopathy, cervical region: Secondary | ICD-10-CM

## 2015-03-19 DIAGNOSIS — Z7189 Other specified counseling: Secondary | ICD-10-CM

## 2015-03-19 DIAGNOSIS — M792 Neuralgia and neuritis, unspecified: Secondary | ICD-10-CM

## 2015-03-19 DIAGNOSIS — Z87891 Personal history of nicotine dependence: Secondary | ICD-10-CM | POA: Diagnosis not present

## 2015-03-19 DIAGNOSIS — R29898 Other symptoms and signs involving the musculoskeletal system: Secondary | ICD-10-CM

## 2015-03-19 DIAGNOSIS — G894 Chronic pain syndrome: Secondary | ICD-10-CM | POA: Insufficient documentation

## 2015-03-19 MED ORDER — LIDOCAINE 5 % EX PTCH: 1.0000 | MEDICATED_PATCH | CUTANEOUS | Status: DC

## 2015-03-19 MED ORDER — GABAPENTIN 300 MG PO CAPS: 600.0000 mg | ORAL_CAPSULE | Freq: Three times a day (TID) | ORAL | Status: DC

## 2015-03-19 MED ORDER — GABAPENTIN 100 MG PO CAPS: 100.0000 mg | ORAL_CAPSULE | Freq: Two times a day (BID) | ORAL | Status: DC

## 2015-03-19 NOTE — Progress Notes (Signed)
Patient's Name: DAJANAY BIELEN MRN: [id number] DOB: Oct 08, 1934 DOS: 03/19/2015  Primary Reason(s) for Visit: Encounter for Medication Management CC: Back Pain and Shoulder Pain   HPI:   Ms. Ryle is a 80 y.o. year old, female patient, who returns today as an established patient. She has Hypothyroidism; GAD (generalized anxiety disorder); Hereditary and idiopathic peripheral neuropathy; Essential hypertension; ALLERGIC RHINITIS; Reflux esophagitis; GERD; INGUINAL HERNIA, LEFT; CONSTIPATION; Irritable bowel syndrome; OTHER CHRONIC CYSTITIS; Osteopenia; DIARRHEA; Frontal sinus pain; Chest pain; Systolic murmur; Medicare annual wellness visit, subsequent; PVCs (premature ventricular contractions); Mid back pain; Bilateral leg pain; Prediabetes; Headache; Dizziness; Mood swings ([place]); Memory deficit; Hair thinning; Advanced care planning/counseling discussion; DDD (degenerative disc disease), cervical; Right hip pain; Left leg injury; HLD (hyperlipidemia); Health maintenance examination; Recurrent falls; Night sweats; Chronic pain; Encounter for therapeutic drug level monitoring; Encounter for chronic pain management; Chronic low back pain; Chronic neck pain; Cervical spondylosis; Chronic cervical radicular pain; Numbness of upper extremity (Bilateral); Upper extremity weakness (Bilateral); Neurogenic pain; Neuropathic pain; Inflammatory pain; Chronic lumbar radicular pain (Left); Acute pain due to injury; Allodynia; and Encounter for long-term current use of medication on her problem list.. Her primarily concern today is the Back Pain and Shoulder Pain     The patient returns to the clinic today for pharmacological management of her chronic pain. However, the patient does not use any type of controlled substances. She manages her pain well with the use of gabapentin. In addition every 3-4 months she has a flareup of her low back pain. She comes in today to see if we can schedule her for lumbar epidural  steroid injection under fluoroscopic guidance. I have agreed. We will schedule her as soon as possible.  The patient's primary pain is described to be the lower back with the left side being worse than the right. Following that is her lower extremity pain which again is worse on him the left compared to the right. In both instances, the pain goes all the way down to the outside portion of the foot in what seems to be an S1 dermatomal distribution. Following this is her neck pain which again is described to be worse on the left when compared to the right. Finally there is the bilateral upper extremity numbness and weakness which is described to be worse on the left side when compared to the right. The patient is right-handed.  In addition, the patient has been having some problems with neck pain and bilateral upper extremity numbness and weakness for which she went to [facility] to see the neurosurgeons for recommended surgical intervention. Since the patient lives in the [place] area, she has requested a consult with a neurosurgeon, at [facility] care so that she can have her surgery done at [facility].  Today's Pain Score: 6  (pain worse at night), clinically she looks more like a 3-410. Reported level of pain is incompatible with clinical obrservations. This may be secondary to a possible lack of understanding on how the pain scale works. Pain Type: Chronic pain Pain Location: Back Pain Orientation: Lower Pain Descriptors / Indicators: Constant Pain Frequency: Constant  Date of Last Visit: 11/29/14 Service Provided on Last Visit: Procedure  Pharmacotherapy Review:   Side-effects or Adverse reactions: None reported Effectiveness: Described as relatively effective, allowing for increase in activities of daily living (ADL) Onset of action: Within expected pharmacological parameters Duration of action: Within normal limits for medication Peak effect: Timing and results are  as within normal expected parameters  [place] PMP: Compliant with practice rules and regulations UDS Results:   none available at this time UDS Interpretation: No UDS available, at this time Medication Assessment Form: Reviewed. Patient indicates being compliant with therapy Treatment compliance: Compliant Substance Use Disorder (SUD) Risk Level: Low Pharmacologic Plan: Continue therapy as is. The patient does not take any controlled substances.  Allergies: Ms. Avallone is allergic to zoloft; amoxicillin; codeine phosphate; metronidazole; oxcarbazepine; perindopril erbumine; potassium-containing compounds; prednisone; prozac; and tramadol hcl.  Meds: The patient has a current medication list which includes the following prescription(s): acetaminophen, amlodipine, aspirin, benzonatate, biotin, bupropion, calcium carbonate-vitamin d, cholecalciferol, diphenhydramine, fluticasone, gabapentin, hyoscyamine, lactobacillus rhamnosus (gg), levothyroxine, loperamide, magnesium, meclizine, multiple vitamins-minerals, nitroglycerin, pantoprazole, potassium gluconate, astragalus root, triamterene-hydrochlorothiazide, vitamin b-12, gabapentin, and lidocaine. Requested Prescriptions   Signed Prescriptions Disp Refills  . gabapentin (NEURONTIN) 300 MG capsule 180 capsule 2    Sig: Take 2 capsules (600 mg total) by mouth 3 (three) times daily.  Marland Kitchen lidocaine (LIDODERM) 5 % 30 patch 2    Sig: Place 1 patch onto the skin daily. Remove & Discard patch within 12 hours or as directed by MD  . gabapentin (NEURONTIN) 100 MG capsule 60 capsule 2    Sig: Take 1 capsule (100 mg total) by mouth 2 (two) times daily.    ROS: Constitutional: Afebrile, no chills, well hydrated and well nourished Gastrointestinal: negative Musculoskeletal:negative Neurological: negative Behavioral/Psych: negative  PFSH: Medical:  Ms. Mullan  has a past medical history of Allergy; Anxiety; Hypertension; Osteopenia; IBS (irritable bowel  syndrome); Hypothyroidism; GERD (gastroesophageal reflux disease); Gastritis; Peripheral neuropathy ([place]); DDD (degenerative disc disease), cervical; Chronic vulvitis; Menopause; History of vaginal dysplasia; S/P TAH-BSO (total abdominal hysterectomy and bilateral salpingo-oophorectomy); Angina pectoris ([place]); Vaginal atrophy; Chronic constipation; IBS (irritable bowel syndrome); Mass of vagina; Abdominal pain, RLQ (right lower quadrant); and DDD (degenerative disc disease), lumbar. Family: family history includes Heart disease in her mother; Stroke in her brother. There is no history of Hypertension, Diabetes, or Cancer. Surgical:  has past surgical history that includes Appendectomy (1948); Abdominal hysterectomy (1968); Ovarian cyst surgery (1963); Cardiac catheterization (09/26/1998); Hernia repair; Colostomy w/ rectocele repair (06/2004); Ventral hernia repair (08/2008); Colonoscopy (07/2009); epidural steroid injections (multiple latest 05/2014, 07/2014); US ECHOCARDIOGRAPHY (2009); NM MYOVIEW LTD (2009); DEXA (2013); and Colonoscopy (06/2014). Tobacco:  reports that she quit smoking about 36 years ago. She has never used smokeless tobacco. Alcohol:  reports that she does not drink alcohol. Drug:  reports that she does not use illicit drugs.  Physical Exam: Vitals:  Today's Vitals   03/19/15 1413 03/19/15 1415  BP: 140/89   Pulse: 80   Temp: 97.9 F (36.6 C)   TempSrc: Oral   Resp: 18   Height: 5' (1.524 m)   Weight: 125 lb (56.7 kg)   SpO2: 99%   PainSc:  6   Calculated BMI: Body mass index is 24.41 kg/(m^2). General appearance: alert, cooperative, appears stated age and mild distress Eyes: PERLA Respiratory: No evidence respiratory distress, no audible rales or ronchi and no use of accessory muscles of respiration Neck: no adenopathy, no carotid bruit, no JVD, supple, symmetrical, trachea midline and thyroid not enlarged, symmetric, no tenderness/mass/nodules  Cervical Spine ROM:  Adequate for flexion, extension, rotation, and lateral bending Palpation: No palpable trigger points  Upper Extremities ROM: Adequate bilaterally Strength: 5/5 for all flexors and extensors of the upper extremity, bilaterally Pulses: Palpable bilaterally Neurologic: No allodynia, No hyperesthesia, No hyperpathia and No sensory abnormalities  Lumbar Spine ROM: Adequate  for flexion, extension, rotation, and lateral bending Palpation: No palpable trigger points Lumbar Hyperextension and rotation: Non-contributory Patrick's Maneuver: Non-contributory  Lower Extremities ROM: Adequate bilaterally Strength: 5/5 for all flexors and extensors of the lower extremity, bilaterally Pulses: Palpable bilaterally Neurologic: No allodynia, No hyperesthesia, No hyperpathia, No sensory abnormalities and No antalgic gait or posture  Assessment: Encounter Diagnosis:  Primary Diagnosis: Chronic pain [G89.29]  Plan: Interventional Therapies: Return to clinic's as soon as possible for a left-sided L4-5 lumbar epidural steroid injection under fluoroscopic guidance, without IV sedation.  Pansey was seen today for back pain and shoulder pain.  Diagnoses and all orders for this visit:  Chronic pain -     B12 and Folate Panel; Future -     COMPLETE METABOLIC PANEL WITH GFR; Future -     C-reactive protein; Future -     Magnesium; Future -     Sedimentation rate; Future -     Vitamin D pnl(25-hydrxy+1,25-dihy)-bld; Future  Long term current use of opiate analgesic -     Drugs of abuse screen w/o alc, rtn urine-sln  Long term prescription opiate use  Opiate use  Encounter for therapeutic drug level monitoring  Encounter for chronic pain management  Chronic low back pain -     LUMBAR EPIDURAL STEROID INJECTION; Future  Chronic neck pain -     CERVICAL EPIDURAL STEROID INJECTION; Standing  Cervical spondylosis -     Ambulatory referral to Neurosurgery  Chronic cervical radicular pain -      Ambulatory referral to Neurosurgery  Numbness of upper extremity (Bilateral) -     Ambulatory referral to Neurosurgery  Upper extremity weakness (Bilateral) -     Ambulatory referral to Neurosurgery  Neurogenic pain -     gabapentin (NEURONTIN) 300 MG capsule; Take 2 capsules (600 mg total) by mouth 3 (three) times daily. -     gabapentin (NEURONTIN) 100 MG capsule; Take 1 capsule (100 mg total) by mouth 2 (two) times daily.  Neuropathic pain -     gabapentin (NEURONTIN) 300 MG capsule; Take 2 capsules (600 mg total) by mouth 3 (three) times daily. -     gabapentin (NEURONTIN) 100 MG capsule; Take 1 capsule (100 mg total) by mouth 2 (two) times daily.  Inflammatory pain  Chronic lumbar radicular pain (Left)  Acute pain due to injury -     lidocaine (LIDODERM) 5 %; Place 1 patch onto the skin daily. Remove & Discard patch within 12 hours or as directed by MD  Allodynia -     gabapentin (NEURONTIN) 300 MG capsule; Take 2 capsules (600 mg total) by mouth 3 (three) times daily. -     lidocaine (LIDODERM) 5 %; Place 1 patch onto the skin daily. Remove & Discard patch within 12 hours or as directed by MD -     gabapentin (NEURONTIN) 100 MG capsule; Take 1 capsule (100 mg total) by mouth 2 (two) times daily.  Encounter for long-term current use of medication     Patient Instructions   GENERAL RISKS AND COMPLICATIONS  What are the risk, side effects and possible complications? Generally speaking, most procedures are safe.  However, with any procedure there are risks, side effects, and the possibility of complications.  The risks and complications are dependent upon the sites that are lesioned, or the type of nerve block to be performed.  The closer the procedure is to the spine, the more serious the risks are.  Great care is taken when  placing the radio frequency needles, block needles or lesioning probes, but sometimes complications can occur. 1. Infection: Any time there is an  injection through the skin, there is a risk of infection.  This is why sterile conditions are used for these blocks.  There are four possible types of infection. 1. Localized skin infection. 2. Central Nervous System Infection-This can be in the form of Meningitis, which can be deadly. 3. Epidural Infections-This can be in the form of an epidural abscess, which can cause pressure inside of the spine, causing compression of the spinal cord with subsequent paralysis. This would require an emergency surgery to decompress, and there are no guarantees that the patient would recover from the paralysis. 4. Discitis-This is an infection of the intervertebral discs.  It occurs in about 1% of discography procedures.  It is difficult to treat and it may lead to surgery.        2. Pain: the needles have to go through skin and soft tissues, will cause soreness.       3. Damage to internal structures:  The nerves to be lesioned may be near blood vessels or    other nerves which can be potentially damaged.       4. Bleeding: Bleeding is more common if the patient is taking blood thinners such as  aspirin, Coumadin, Ticiid, Plavix, etc., or if he/she have some genetic predisposition  such as hemophilia. Bleeding into the spinal canal can cause compression of the spinal  cord with subsequent paralysis.  This would require an emergency surgery to  decompress and there are no guarantees that the patient would recover from the  paralysis.       5. Pneumothorax:  Puncturing of a lung is a possibility, every time a needle is introduced in  the area of the chest or upper back.  Pneumothorax refers to free air around the  collapsed lung(s), inside of the thoracic cavity (chest cavity).  Another two possible  complications related to a similar event would include: Hemothorax and Chylothorax.   These are variations of the Pneumothorax, where instead of air around the collapsed  lung(s), you may have blood or chyle, respectively.        6. Spinal headaches: They may occur with any procedures in the area of the spine.       7. Persistent CSF (Cerebro-Spinal Fluid) leakage: This is a rare problem, but may occur  with prolonged intrathecal or epidural catheters either due to the formation of a fistulous  track or a dural tear.       8. Nerve damage: By working so close to the spinal cord, there is always a possibility of  nerve damage, which could be as serious as a permanent spinal cord injury with  paralysis.       9. Death:  Although rare, severe deadly allergic reactions known as "Anaphylactic  reaction" can occur to any of the medications used.      10. Worsening of the symptoms:  We can always make thing worse.  What are the chances of something like this happening? Chances of any of this occuring are extremely low.  By statistics, you have more of a chance of getting killed in a motor vehicle accident: while driving to the hospital than any of the above occurring .  Nevertheless, you should be aware that they are possibilities.  In general, it is similar to taking a shower.  Everybody knows that you can slip, hit your  head and get killed.  Does that mean that you should not shower again?  Nevertheless always keep in mind that statistics do not mean anything if you happen to be on the wrong side of them.  Even if a procedure has a 1 (one) in a 1,000,000 (million) chance of going wrong, it you happen to be that one..Also, keep in mind that by statistics, you have more of a chance of having something go wrong when taking medications.  Who should not have this procedure? If you are on a blood thinning medication (e.g. Coumadin, Plavix, see list of "Blood Thinners"), or if you have an active infection going on, you should not have the procedure.  If you are taking any blood thinners, please inform your physician.  How should I prepare for this procedure?  Do not eat or drink anything at least six hours prior to the  procedure.  Bring a driver with you .  It cannot be a taxi.  Come accompanied by an adult that can drive you back, and that is strong enough to help you if your legs get weak or numb from the local anesthetic.  Take all of your medicines the morning of the procedure with just enough water to swallow them.  If you have diabetes, make sure that you are scheduled to have your procedure done first thing in the morning, whenever possible.  If you have diabetes, take only half of your insulin dose and notify our nurse that you have done so as soon as you arrive at the clinic.  If you are diabetic, but only take blood sugar pills (oral hypoglycemic), then do not take them on the morning of your procedure.  You may take them after you have had the procedure.  Do not take aspirin or any aspirin-containing medications, at least eleven (11) days prior to the procedure.  They may prolong bleeding.  Wear loose fitting clothing that may be easy to take off and that you would not mind if it got stained with Betadine or blood.  Do not wear any jewelry or perfume  Remove any nail coloring.  It will interfere with some of our monitoring equipment.  NOTE: Remember that this is not meant to be interpreted as a complete list of all possible complications.  Unforeseen problems may occur.  BLOOD THINNERS The following drugs contain aspirin or other products, which can cause increased bleeding during surgery and should not be taken for 2 weeks prior to and 1 week after surgery.  If you should need take something for relief of minor pain, you may take acetaminophen which is found in Tylenol,m Datril, Anacin-3 and Panadol. It is not blood thinner. The products listed below are.  Do not take any of the products listed below in addition to any listed on your instruction sheet.  A.P.C or A.P.C with Codeine Codeine Phosphate Capsules #3 Ibuprofen Ridaura  ABC compound Congesprin Imuran rimadil  Advil Cope Indocin  Robaxisal  Alka-Seltzer Effervescent Pain Reliever and Antacid Coricidin or Coricidin-D  Indomethacin Rufen  Alka-Seltzer plus Cold Medicine Cosprin Ketoprofen S-A-C Tablets  Anacin Analgesic Tablets or Capsules Coumadin Korlgesic Salflex  Anacin Extra Strength Analgesic tablets or capsules CP-2 Tablets Lanoril Salicylate  Anaprox Cuprimine Capsules Levenox Salocol  Anexsia-D Dalteparin Magan Salsalate  Anodynos Darvon compound Magnesium Salicylate Sine-off  Ansaid Dasin Capsules Magsal Sodium Salicylate  Anturane Depen Capsules Marnal Soma  APF Arthritis pain formula Dewitt's Pills Measurin Stanback  Argesic Dia-Gesic Meclofenamic Sulfinpyrazone  Arthritis  Bayer Timed Release Aspirin Diclofenac Meclomen Sulindac  Arthritis pain formula Anacin Dicumarol Medipren Supac  Analgesic (Safety coated) Arthralgen Diffunasal Mefanamic Suprofen  Arthritis Strength Bufferin Dihydrocodeine Mepro Compound Suprol  Arthropan liquid Dopirydamole Methcarbomol with Aspirin Synalgos  ASA tablets/Enseals Disalcid Micrainin Tagament  Ascriptin Doan's Midol Talwin  Ascriptin A/D Dolene Mobidin Tanderil  Ascriptin Extra Strength Dolobid Moblgesic Ticlid  Ascriptin with Codeine Doloprin or Doloprin with Codeine Momentum Tolectin  Asperbuf Duoprin Mono-gesic Trendar  Aspergum Duradyne Motrin or Motrin IB Triminicin  Aspirin plain, buffered or enteric coated Durasal Myochrisine Trigesic  Aspirin Suppositories Easprin Nalfon Trillsate  Aspirin with Codeine Ecotrin Regular or Extra Strength Naprosyn Uracel  Atromid-S Efficin Naproxen Ursinus  Auranofin Capsules Elmiron Neocylate Vanquish  Axotal Emagrin Norgesic Verin  Azathioprine Empirin or Empirin with Codeine Normiflo Vitamin E  Azolid Emprazil Nuprin Voltaren  Bayer Aspirin plain, buffered or children's or timed BC Tablets or powders Encaprin Orgaran Warfarin Sodium  Buff-a-Comp Enoxaparin Orudis Zorpin  Buff-a-Comp with Codeine Equegesic Os-Cal-Gesic    Buffaprin Excedrin plain, buffered or Extra Strength Oxalid   Bufferin Arthritis Strength Feldene Oxphenbutazone   Bufferin plain or Extra Strength Feldene Capsules Oxycodone with Aspirin   Bufferin with Codeine Fenoprofen Fenoprofen Pabalate or Pabalate-SF   Buffets II Flogesic Panagesic   Buffinol plain or Extra Strength Florinal or Florinal with Codeine Panwarfarin   Buf-Tabs Flurbiprofen Penicillamine   Butalbital Compound Four-way cold tablets Penicillin   Butazolidin Fragmin Pepto-Bismol   Carbenicillin Geminisyn Percodan   Carna Arthritis Reliever Geopen Persantine   Carprofen Gold's salt Persistin   Chloramphenicol Goody's Phenylbutazone   Chloromycetin Haltrain Piroxlcam   Clmetidine heparin Plaquenil   Cllnoril Hyco-pap Ponstel   Clofibrate Hydroxy chloroquine Propoxyphen         Before stopping any of these medications, be sure to consult the physician who ordered them.  Some, such as Coumadin (Warfarin) are ordered to prevent or treat serious conditions such as "deep thrombosis", "pumonary embolisms", and other heart problems.  The amount of time that you may need off of the medication may also vary with the medication and the reason for which you were taking it.  If you are taking any of these medications, please make sure you notify your pain physician before you undergo any procedures.         Epidural Steroid Injection An epidural steroid injection is given to relieve pain in your neck, back, or legs that is caused by the irritation or swelling of a nerve root. This procedure involves injecting a steroid and numbing medicine (anesthetic) into the epidural space. The epidural space is the space between the outer covering of your spinal cord and the bones that form your backbone (vertebra).  LET [facility] CARE PROVIDER KNOW ABOUT:  2. Any allergies you have. 3. All medicines you are taking, including vitamins, herbs, eye drops, creams, and over-the-counter medicines  such as aspirin. 4. Previous problems you or members of your family have had with the use of anesthetics. 5. Any blood disorders or blood clotting disorders you have. 6. Previous surgeries you have had. 7. Medical conditions you have. RISKS AND COMPLICATIONS Generally, this is a safe procedure. However, as with any procedure, complications can occur. Possible complications of epidural steroid injection include:  Headache.  Bleeding.  Infection.  Allergic reaction to the medicines.  Damage to your nerves. The response to this procedure depends on the underlying cause of the pain and its duration. People who have long-term (chronic) pain are less  likely to benefit from epidural steroids than are those people whose pain comes on strong and suddenly. BEFORE THE PROCEDURE   Ask your health care provider about changing or stopping your regular medicines. You may be advised to stop taking blood-thinning medicines a few days before the procedure.  You may be given medicines to reduce anxiety.  Arrange for someone to take you home after the procedure. PROCEDURE   You will remain awake during the procedure. You may receive medicine to make you relaxed.  You will be asked to lie on your stomach.  The injection site will be cleaned.  The injection site will be numbed with a medicine (local anesthetic).  A needle will be injected through your skin into the epidural space.  Your health care provider will use an X-ray machine to ensure that the steroid is delivered closest to the affected nerve. You may have minimal discomfort at this time.  Once the needle is in the right position, the local anesthetic and the steroid will be injected into the epidural space.  The needle will then be removed and a bandage will be applied to the injection site. AFTER THE PROCEDURE  12. You may be monitored for a short time before you go home. 13. You may feel weakness or numbness in your arm or leg,  which disappears within hours. 14. You may be allowed to eat, drink, and take your regular medicine. 15. You may have soreness at the site of the injection.   This information is not intended to replace advice given to you by your health care provider. Make sure you discuss any questions you have with your health care provider.   Document Released: 07/01/2007 Document Revised: 11/24/2012 Document Reviewed: 09/10/2012 Elsevier Interactive Patient Education 2016 [employer].   Medications discontinued today:  Medications Discontinued During This Encounter  Medication Reason  . diclofenac (FLECTOR) 1.3 % PTCH Error  . gabapentin (NEURONTIN) 300 MG capsule Reorder  . gabapentin (NEURONTIN) 123XX123 MG capsule Duplicate   Medications administered today:  Ms. Arboleda had no medications administered during this visit.  Primary Care Physician: Ria Bush, MD Location: [facility] Note by: Aislee Landgren A. [name], M.D, DABA, DABAPM, DABPM, DABIPP, FIPP

## 2015-03-19 NOTE — Patient Instructions (Signed)
GENERAL RISKS AND COMPLICATIONS  What are the risk, side effects and possible complications? Generally speaking, most procedures are safe.  However, with any procedure there are risks, side effects, and the possibility of complications.  The risks and complications are dependent upon the sites that are lesioned, or the type of nerve block to be performed.  The closer the procedure is to the spine, the more serious the risks are.  Great care is taken when placing the radio frequency needles, block needles or lesioning probes, but sometimes complications can occur. 1. Infection: Any time there is an injection through the skin, there is a risk of infection.  This is why sterile conditions are used for these blocks.  There are four possible types of infection. 1. Localized skin infection. 2. Central Nervous System Infection-This can be in the form of Meningitis, which can be deadly. 3. Epidural Infections-This can be in the form of an epidural abscess, which can cause pressure inside of the spine, causing compression of the spinal cord with subsequent paralysis. This would require an emergency surgery to decompress, and there are no guarantees that the patient would recover from the paralysis. 4. Discitis-This is an infection of the intervertebral discs.  It occurs in about 1% of discography procedures.  It is difficult to treat and it may lead to surgery.        2. Pain: the needles have to go through skin and soft tissues, will cause soreness.       3. Damage to internal structures:  The nerves to be lesioned may be near blood vessels or    other nerves which can be potentially damaged.       4. Bleeding: Bleeding is more common if the patient is taking blood thinners such as  aspirin, Coumadin, Ticiid, Plavix, etc., or if he/she have some genetic predisposition  such as hemophilia. Bleeding into the spinal canal can cause compression of the spinal  cord with subsequent paralysis.  This would require an  emergency surgery to  decompress and there are no guarantees that the patient would recover from the  paralysis.       5. Pneumothorax:  Puncturing of a lung is a possibility, every time a needle is introduced in  the area of the chest or upper back.  Pneumothorax refers to free air around the  collapsed lung(s), inside of the thoracic cavity (chest cavity).  Another two possible  complications related to a similar event would include: Hemothorax and Chylothorax.   These are variations of the Pneumothorax, where instead of air around the collapsed  lung(s), you may have blood or chyle, respectively.       6. Spinal headaches: They may occur with any procedures in the area of the spine.       7. Persistent CSF (Cerebro-Spinal Fluid) leakage: This is a rare problem, but may occur  with prolonged intrathecal or epidural catheters either due to the formation of a fistulous  track or a dural tear.       8. Nerve damage: By working so close to the spinal cord, there is always a possibility of  nerve damage, which could be as serious as a permanent spinal cord injury with  paralysis.       9. Death:  Although rare, severe deadly allergic reactions known as "Anaphylactic  reaction" can occur to any of the medications used.      10. Worsening of the symptoms:  We can always make thing worse.    What are the chances of something like this happening? Chances of any of this occuring are extremely low.  By statistics, you have more of a chance of getting killed in a motor vehicle accident: while driving to the hospital than any of the above occurring .  Nevertheless, you should be aware that they are possibilities.  In general, it is similar to taking a shower.  Everybody knows that you can slip, hit your head and get killed.  Does that mean that you should not shower again?  Nevertheless always keep in mind that statistics do not mean anything if you happen to be on the wrong side of them.  Even if a procedure has a 1  (one) in a 1,000,000 (million) chance of going wrong, it you happen to be that one..Also, keep in mind that by statistics, you have more of a chance of having something go wrong when taking medications.  Who should not have this procedure? If you are on a blood thinning medication (e.g. Coumadin, Plavix, see list of "Blood Thinners"), or if you have an active infection going on, you should not have the procedure.  If you are taking any blood thinners, please inform your physician.  How should I prepare for this procedure?  Do not eat or drink anything at least six hours prior to the procedure.  Bring a driver with you .  It cannot be a taxi.  Come accompanied by an adult that can drive you back, and that is strong enough to help you if your legs get weak or numb from the local anesthetic.  Take all of your medicines the morning of the procedure with just enough water to swallow them.  If you have diabetes, make sure that you are scheduled to have your procedure done first thing in the morning, whenever possible.  If you have diabetes, take only half of your insulin dose and notify our nurse that you have done so as soon as you arrive at the clinic.  If you are diabetic, but only take blood sugar pills (oral hypoglycemic), then do not take them on the morning of your procedure.  You may take them after you have had the procedure.  Do not take aspirin or any aspirin-containing medications, at least eleven (11) days prior to the procedure.  They may prolong bleeding.  Wear loose fitting clothing that may be easy to take off and that you would not mind if it got stained with Betadine or blood.  Do not wear any jewelry or perfume  Remove any nail coloring.  It will interfere with some of our monitoring equipment.  NOTE: Remember that this is not meant to be interpreted as a complete list of all possible complications.  Unforeseen problems may occur.  BLOOD THINNERS The following drugs  contain aspirin or other products, which can cause increased bleeding during surgery and should not be taken for 2 weeks prior to and 1 week after surgery.  If you should need take something for relief of minor pain, you may take acetaminophen which is found in Tylenol,m Datril, Anacin-3 and Panadol. It is not blood thinner. The products listed below are.  Do not take any of the products listed below in addition to any listed on your instruction sheet.  A.P.C or A.P.C with Codeine Codeine Phosphate Capsules #3 Ibuprofen Ridaura  ABC compound Congesprin Imuran rimadil  Advil Cope Indocin Robaxisal  Alka-Seltzer Effervescent Pain Reliever and Antacid Coricidin or Coricidin-D  Indomethacin Rufen    Alka-Seltzer plus Cold Medicine Cosprin Ketoprofen S-A-C Tablets  Anacin Analgesic Tablets or Capsules Coumadin Korlgesic Salflex  Anacin Extra Strength Analgesic tablets or capsules CP-2 Tablets Lanoril Salicylate  Anaprox Cuprimine Capsules Levenox Salocol  Anexsia-D Dalteparin Magan Salsalate  Anodynos Darvon compound Magnesium Salicylate Sine-off  Ansaid Dasin Capsules Magsal Sodium Salicylate  Anturane Depen Capsules Marnal Soma  APF Arthritis pain formula Dewitt's Pills Measurin Stanback  Argesic Dia-Gesic Meclofenamic Sulfinpyrazone  Arthritis Bayer Timed Release Aspirin Diclofenac Meclomen Sulindac  Arthritis pain formula Anacin Dicumarol Medipren Supac  Analgesic (Safety coated) Arthralgen Diffunasal Mefanamic Suprofen  Arthritis Strength Bufferin Dihydrocodeine Mepro Compound Suprol  Arthropan liquid Dopirydamole Methcarbomol with Aspirin Synalgos  ASA tablets/Enseals Disalcid Micrainin Tagament  Ascriptin Doan's Midol Talwin  Ascriptin A/D Dolene Mobidin Tanderil  Ascriptin Extra Strength Dolobid Moblgesic Ticlid  Ascriptin with Codeine Doloprin or Doloprin with Codeine Momentum Tolectin  Asperbuf Duoprin Mono-gesic Trendar  Aspergum Duradyne Motrin or Motrin IB Triminicin  Aspirin  plain, buffered or enteric coated Durasal Myochrisine Trigesic  Aspirin Suppositories Easprin Nalfon Trillsate  Aspirin with Codeine Ecotrin Regular or Extra Strength Naprosyn Uracel  Atromid-S Efficin Naproxen Ursinus  Auranofin Capsules Elmiron Neocylate Vanquish  Axotal Emagrin Norgesic Verin  Azathioprine Empirin or Empirin with Codeine Normiflo Vitamin E  Azolid Emprazil Nuprin Voltaren  Bayer Aspirin plain, buffered or children's or timed BC Tablets or powders Encaprin Orgaran Warfarin Sodium  Buff-a-Comp Enoxaparin Orudis Zorpin  Buff-a-Comp with Codeine Equegesic Os-Cal-Gesic   Buffaprin Excedrin plain, buffered or Extra Strength Oxalid   Bufferin Arthritis Strength Feldene Oxphenbutazone   Bufferin plain or Extra Strength Feldene Capsules Oxycodone with Aspirin   Bufferin with Codeine Fenoprofen Fenoprofen Pabalate or Pabalate-SF   Buffets II Flogesic Panagesic   Buffinol plain or Extra Strength Florinal or Florinal with Codeine Panwarfarin   Buf-Tabs Flurbiprofen Penicillamine   Butalbital Compound Four-way cold tablets Penicillin   Butazolidin Fragmin Pepto-Bismol   Carbenicillin Geminisyn Percodan   Carna Arthritis Reliever Geopen Persantine   Carprofen Gold's salt Persistin   Chloramphenicol Goody's Phenylbutazone   Chloromycetin Haltrain Piroxlcam   Clmetidine heparin Plaquenil   Cllnoril Hyco-pap Ponstel   Clofibrate Hydroxy chloroquine Propoxyphen         Before stopping any of these medications, be sure to consult the physician who ordered them.  Some, such as Coumadin (Warfarin) are ordered to prevent or treat serious conditions such as "deep thrombosis", "pumonary embolisms", and other heart problems.  The amount of time that you may need off of the medication may also vary with the medication and the reason for which you were taking it.  If you are taking any of these medications, please make sure you notify your pain physician before you undergo any  procedures.         Epidural Steroid Injection An epidural steroid injection is given to relieve pain in your neck, back, or legs that is caused by the irritation or swelling of a nerve root. This procedure involves injecting a steroid and numbing medicine (anesthetic) into the epidural space. The epidural space is the space between the outer covering of your spinal cord and the bones that form your backbone (vertebra).  LET YOUR HEALTH CARE PROVIDER KNOW ABOUT:  2. Any allergies you have. 3. All medicines you are taking, including vitamins, herbs, eye drops, creams, and over-the-counter medicines such as aspirin. 4. Previous problems you or members of your family have had with the use of anesthetics. 5. Any blood disorders or blood clotting disorders you have.   6. Previous surgeries you have had. 7. Medical conditions you have. RISKS AND COMPLICATIONS Generally, this is a safe procedure. However, as with any procedure, complications can occur. Possible complications of epidural steroid injection include:  Headache.  Bleeding.  Infection.  Allergic reaction to the medicines.  Damage to your nerves. The response to this procedure depends on the underlying cause of the pain and its duration. People who have long-term (chronic) pain are less likely to benefit from epidural steroids than are those people whose pain comes on strong and suddenly. BEFORE THE PROCEDURE   Ask your health care provider about changing or stopping your regular medicines. You may be advised to stop taking blood-thinning medicines a few days before the procedure.  You may be given medicines to reduce anxiety.  Arrange for someone to take you home after the procedure. PROCEDURE   You will remain awake during the procedure. You may receive medicine to make you relaxed.  You will be asked to lie on your stomach.  The injection site will be cleaned.  The injection site will be numbed with a medicine (local  anesthetic).  A needle will be injected through your skin into the epidural space.  Your health care provider will use an X-ray machine to ensure that the steroid is delivered closest to the affected nerve. You may have minimal discomfort at this time.  Once the needle is in the right position, the local anesthetic and the steroid will be injected into the epidural space.  The needle will then be removed and a bandage will be applied to the injection site. AFTER THE PROCEDURE  12. You may be monitored for a short time before you go home. 13. You may feel weakness or numbness in your arm or leg, which disappears within hours. 14. You may be allowed to eat, drink, and take your regular medicine. 15. You may have soreness at the site of the injection.   This information is not intended to replace advice given to you by your health care provider. Make sure you discuss any questions you have with your health care provider.   Document Released: 07/01/2007 Document Revised: 11/24/2012 Document Reviewed: 09/10/2012 Elsevier Interactive Patient Education 2016 [employer]  

## 2015-03-19 NOTE — Progress Notes (Signed)
Safety precautions to be maintained throughout the outpatient stay will include: orient to surroundings, keep bed in low position, maintain call bell within reach at all times, provide assistance with transfer out of bed and ambulation.  Did not bring Medications with her today- instructed to bring with her to next appointment Flector patches have been denied by insurance company but states it works well Discussed importance of feeling out my chart and is fimiliar with it- will have daughter to fill out

## 2015-03-22 ENCOUNTER — Encounter: Payer: Self-pay | Admitting: Pain Medicine

## 2015-03-22 ENCOUNTER — Ambulatory Visit: Payer: Medicare Other | Attending: Pain Medicine | Admitting: Pain Medicine

## 2015-03-22 VITALS — BP 148/79 | HR 77 | Temp 98.2°F | Resp 22 | Ht 60.0 in | Wt 125.0 lb

## 2015-03-22 DIAGNOSIS — G8929 Other chronic pain: Secondary | ICD-10-CM | POA: Diagnosis not present

## 2015-03-22 DIAGNOSIS — M47816 Spondylosis without myelopathy or radiculopathy, lumbar region: Secondary | ICD-10-CM | POA: Insufficient documentation

## 2015-03-22 DIAGNOSIS — M47817 Spondylosis without myelopathy or radiculopathy, lumbosacral region: Secondary | ICD-10-CM | POA: Diagnosis not present

## 2015-03-22 DIAGNOSIS — M5416 Radiculopathy, lumbar region: Secondary | ICD-10-CM | POA: Insufficient documentation

## 2015-03-22 DIAGNOSIS — K589 Irritable bowel syndrome without diarrhea: Secondary | ICD-10-CM | POA: Diagnosis not present

## 2015-03-22 DIAGNOSIS — K219 Gastro-esophageal reflux disease without esophagitis: Secondary | ICD-10-CM | POA: Diagnosis not present

## 2015-03-22 DIAGNOSIS — F411 Generalized anxiety disorder: Secondary | ICD-10-CM | POA: Diagnosis not present

## 2015-03-22 DIAGNOSIS — Z79891 Long term (current) use of opiate analgesic: Secondary | ICD-10-CM

## 2015-03-22 DIAGNOSIS — M549 Dorsalgia, unspecified: Secondary | ICD-10-CM | POA: Diagnosis present

## 2015-03-22 DIAGNOSIS — E785 Hyperlipidemia, unspecified: Secondary | ICD-10-CM | POA: Insufficient documentation

## 2015-03-22 DIAGNOSIS — K4090 Unilateral inguinal hernia, without obstruction or gangrene, not specified as recurrent: Secondary | ICD-10-CM | POA: Diagnosis not present

## 2015-03-22 DIAGNOSIS — I10 Essential (primary) hypertension: Secondary | ICD-10-CM | POA: Insufficient documentation

## 2015-03-22 DIAGNOSIS — M79602 Pain in left arm: Secondary | ICD-10-CM

## 2015-03-22 DIAGNOSIS — M4726 Other spondylosis with radiculopathy, lumbar region: Secondary | ICD-10-CM

## 2015-03-22 DIAGNOSIS — R7303 Prediabetes: Secondary | ICD-10-CM | POA: Diagnosis not present

## 2015-03-22 DIAGNOSIS — E039 Hypothyroidism, unspecified: Secondary | ICD-10-CM | POA: Diagnosis not present

## 2015-03-22 DIAGNOSIS — M25551 Pain in right hip: Secondary | ICD-10-CM

## 2015-03-22 DIAGNOSIS — M79606 Pain in leg, unspecified: Secondary | ICD-10-CM

## 2015-03-22 DIAGNOSIS — M79605 Pain in left leg: Secondary | ICD-10-CM

## 2015-03-22 DIAGNOSIS — M5412 Radiculopathy, cervical region: Secondary | ICD-10-CM

## 2015-03-22 DIAGNOSIS — S8992XS Unspecified injury of left lower leg, sequela: Secondary | ICD-10-CM

## 2015-03-22 MED ORDER — IOHEXOL 180 MG/ML  SOLN
INTRAMUSCULAR | Status: AC
  Administered 2015-03-22: 2 mL via INTRATHECAL
  Filled 2015-03-22: qty 20

## 2015-03-22 MED ORDER — TRIAMCINOLONE ACETONIDE 40 MG/ML IJ SUSP: 40.0000 mg | Freq: Once | INTRAMUSCULAR | Status: DC

## 2015-03-22 MED ORDER — ROPIVACAINE HCL 2 MG/ML IJ SOLN
INTRAMUSCULAR | Status: AC
  Filled 2015-03-22: qty 10

## 2015-03-22 MED ORDER — TRIAMCINOLONE ACETONIDE 40 MG/ML IJ SUSP
INTRAMUSCULAR | Status: AC
Start: 2015-03-22 — End: 2015-03-22
  Administered 2015-03-22: 12:00:00
  Filled 2015-03-22: qty 1

## 2015-03-22 MED ORDER — SODIUM CHLORIDE 0.9 % IJ SOLN
INTRAMUSCULAR | Status: AC
  Administered 2015-03-22: 12:00:00
  Filled 2015-03-22: qty 10

## 2015-03-22 MED ORDER — LIDOCAINE HCL (PF) 1 % IJ SOLN: 10.0000 mL | Freq: Once | INTRAMUSCULAR | Status: DC

## 2015-03-22 MED ORDER — SODIUM CHLORIDE 0.9 % IJ SOLN: 2.0000 mL | Freq: Once | INTRAMUSCULAR | Status: DC

## 2015-03-22 MED ORDER — ROPIVACAINE HCL 2 MG/ML IJ SOLN: 2.0000 mL | Freq: Once | INTRAMUSCULAR | Status: DC

## 2015-03-22 MED ORDER — LIDOCAINE HCL (PF) 1 % IJ SOLN
INTRAMUSCULAR | Status: AC
Start: 2015-03-22 — End: 2015-03-22
  Administered 2015-03-22: 12:00:00
  Filled 2015-03-22: qty 5

## 2015-03-22 MED ORDER — IOHEXOL 180 MG/ML  SOLN: 10.0000 mL | Freq: Once | INTRAMUSCULAR | Status: DC | PRN

## 2015-03-22 MED ORDER — ROPIVACAINE HCL 2 MG/ML IJ SOLN
INTRAMUSCULAR | Status: AC
Start: 2015-03-22 — End: 2015-03-22
  Administered 2015-03-22: 12:00:00
  Filled 2015-03-22: qty 10

## 2015-03-22 MED ORDER — TRIAMCINOLONE ACETONIDE 40 MG/ML IJ SUSP
INTRAMUSCULAR | Status: DC
Start: 2015-03-22 — End: 2015-03-22
  Filled 2015-03-22: qty 1

## 2015-03-22 NOTE — Progress Notes (Signed)
Safety precautions to be maintained throughout the outpatient stay will include: orient to surroundings, keep bed in low position, maintain call bell within reach at all times, provide assistance with transfer out of bed and ambulation.  

## 2015-03-22 NOTE — Progress Notes (Signed)
Patient's Name: Cheyenne Pierce MRN: [id number] DOB: 02-22-1935 DOS: 03/22/2015  Primary Reason(s) for Visit: Interventional Pain Management Treatment. CC: Back Pain   Pre-Procedure Assessment: Cheyenne Pierce is a 80 y.o. year old, female patient, seen today for interventional treatment. She has Hypothyroidism; GAD (generalized anxiety disorder); Hereditary and idiopathic peripheral neuropathy; Essential hypertension; Reflux esophagitis; GERD; INGUINAL HERNIA, LEFT; Irritable bowel syndrome; OTHER CHRONIC CYSTITIS; Osteopenia; Frontal sinus pain; Systolic murmur; Medicare annual wellness visit, subsequent; PVCs (premature ventricular contractions); Prediabetes; Headache; Dizziness; Mood swings ([place]); Memory deficit; Hair thinning; Advanced care planning/counseling discussion; HLD (hyperlipidemia); Health maintenance examination; Recurrent falls; Night sweats; Chronic pain; Encounter for therapeutic drug level monitoring; Encounter for chronic pain management; Chronic low back pain; Chronic neck pain; Cervical spondylosis; Chronic cervical radicular pain (Bilateral) (R>L); Numbness of upper extremity (Bilateral) (R>L); Upper extremity weakness (Bilateral); Neurogenic pain; Neuropathic pain; Inflammatory pain; Chronic lumbar radicular pain (Left); Allodynia; Encounter for long-term current use of medication; Lumbar spondylosis; Lumbar radiculopathy, chronic (Left); Chronic pain of lower extremity (Bilateral) (L>R); and Chronic hip pain (Right) on her problem list.. Her primarily concern today is the Back Pain Verification of the correct person, correct site (including marking of site), and correct procedure were performed and confirmed by the patient. Today's Vitals   03/22/15 1137 03/22/15 1142 03/22/15 1147 03/22/15 1148  BP: 149/82 145/81 149/84 148/79  Pulse: 73 76 79 77  Temp:      TempSrc:      Resp: '13 20 16 22  ' Height:      Weight:      SpO2: 98% 98% 99% 98%  PainSc:    0-No pain  Calculated  BMI: Body mass index is 24.41 kg/(m^2). Allergies: She is allergic to zoloft; amoxicillin; codeine phosphate; metronidazole; oxcarbazepine; perindopril erbumine; potassium-containing compounds; prednisone; prozac; and tramadol hcl.. Primary Diagnosis: Chronic radicular lumbar pain [M54.16, G89.29]  Procedure: Type: Palliative Inter-Laminar Epidural Steroid Injection Region: Lumbar Level: L4-5 Level. Laterality: Left-Sided Paramedial  Indications: Spondylosis, Lumbosacral Region  Consent: Secured. Under the influence of no sedatives a written informed consent was obtained, after having provided information on the risks and possible complications. To fulfill our ethical and legal obligations, as recommended by the American Medical Association's Code of Ethics, we have provided information to the patient about our clinical impression; the nature and purpose of the treatment or procedure; the risks, benefits, and possible complications of the intervention; alternatives; the risk(s) and benefit(s) of the alternative treatment(s) or procedure(s); and the risk(s) and benefit(s) of doing nothing. The patient was provided information about the risks and possible complications associated with the procedure. In the case of spinal procedures these may include, but are not limited to, failure to achieve desired goals, infection, bleeding, organ or nerve damage, allergic reactions, paralysis, and death. In addition, the patient was informed that Medicine is not an exact science; therefore, there is also the possibility of unforeseen risks and possible complications that may result in a catastrophic outcome. The patient indicated having understood very clearly. We have given the patient no guarantees and we have made no promises. Enough time was given to the patient to ask questions, all of which were answered to the patient's satisfaction.  Pre-Procedure Preparation: Safety Precautions: Allergies reviewed.  Appropriate site, procedure, and patient were confirmed by following the Joint Commission's Universal Protocol (UP.01.01.01), in the form of a "Time Out". The patient was asked to confirm marked site and procedure, before commencing. The patient was asked about blood thinners, or active infections, both of  which were denied. Patient was assessed for positional comfort and all pressure points were checked before starting procedure. Monitoring:  As per clinic protocol. Infection Control Precautions: Sterile technique used. Standard Universal Precautions were taken as recommended by the Department of [facility] for Disease Control and Prevention (CDC). Standard pre-surgical skin prep was conducted. Respiratory hygiene and cough etiquette was practiced. Hand hygiene observed. Safe injection practices and needle disposal techniques followed. SDV (single dose vial) medications used. Medications properly checked for expiration dates and contaminants. Personal protective equipment (PPE) used: Surgical mask. Sterile double glove technique. Radiation resistant gloves. Sterile surgical gloves.  Anesthesia, Analgesia, Anxiolysis: Type: Local Anesthesia Local Anesthetic(s): Lidocaine 1% Route: Subcutaneous IV Access: Declined. Sedation: Declined. Indication(s):Not applicable.  Description of Procedure Process:  Time-out: "Time-out" completed before starting procedure, as per protocol. Position: Prone Target Area: For Epidural Steroid injections, the target area is the  interlaminar space, initially targeting the lower border of the superior vertebral body lamina. Approach: Posterior approach. Area Prepped: Entire Posterior Lumbosacral Region Prepping solution: ChloraPrep (2% chlorhexidine gluconate and 70% isopropyl alcohol) Safety Precautions: Aspiration looking for blood return was conducted prior to all injections. At no point did we inject any substances, as a needle was being advanced. No  attempts were made at seeking any paresthesias. Safe injection practices and needle disposal techniques used. Medications properly checked for expiration dates. SDV (single dose vial) medications used. Description of the Procedure: Protocol guidelines were followed. The patient was placed in position over the fluoroscopy table. The target area was identified and the area prepped in the usual manner. Skin desensitized using vapocoolant spray. Skin & deeper tissues infiltrated with local anesthetic. Appropriate amount of time allowed to pass for local anesthetics to take effect. The procedure needle was introduced through the skin, ipsilateral to the reported pain, and advanced to the target area. Bone was contacted and the needle walked caudad, until the lamina was cleared. The epidural space was identified using "loss-of-resistance technique" with 2-3 ml of PF-NaCl (0.9% NSS), in a 5cc LOR glass syringe. Proper needle placement secured. Negative aspiration confirmed. Solution injected in intermittent fashion, asking for systemic symptoms every 0.5cc of injectate. The needles were then removed and the area cleansed, making sure to leave some of the prepping solution back to take advantage of its long term bactericidal properties. EBL: None Materials & Medications Used:  Needle(s) Used: 20g - 10cm, Tuohy-style epidural needle Medications Administered today: We administered iohexol, lidocaine (PF), sodium chloride, ropivacaine (PF) 2 mg/ml (0.2%), and triamcinolone acetonide.Please see chart orders for dosing details.  Imaging Guidance:  Type of Imaging Technique: Fluoroscopy Guidance (Spinal) Indication(s): Assistance in needle guidance and placement for procedures requiring needle placement in or near specific anatomical locations not easily accessible without such assistance. Exposure Time: Please see nurses notes. Contrast: Before injecting any contrast, we confirmed that the patient did not have an  allergy to iodine, shellfish, or radiological contrast. Once satisfactory needle placement was completed at the desired level, radiological contrast was injected. Injection was conducted under continuous fluoroscopic guidance. Injection of contrast accomplished without complications. See chart for type and volume of contrast used. Fluoroscopic Guidance: I was personally present in the fluoroscopy suite, where the patient was placed in position for the procedure, over the fluoroscopy-compatible table. Fluoroscopy was manipulated, using "Tunnel Vision Technique", to obtain the best possible view of the target area, on the affected side. Parallax error was corrected before commencing the procedure. A "direction-depth-direction" technique was used to introduce the needle  under continuous pulsed fluoroscopic guidance. Once the target was reached, antero-posterior, oblique, and lateral fluoroscopic projection views were taken to confirm needle placement in all planes. Permanently recorded images stored by scanning into EMR. Interpretation: Intraoperative imaging interpretation by performing Physician. Adequate needle placement confirmed. Adequate needle placement confirmed in AP, lateral, & Oblique Views. Appropriate spread of contrast to desired area. No evidence of afferent or efferent intravascular uptake. No intrathecal or subarachnoid spread observed. Permanent hardcopy images in multiple planes scanned into the patient's record.  Antibiotics:  Type:  Antibiotics Given (last 72 hours)    None      Indication(s): No indications identified.  Post-operative Assessment:  Complications: No immediate post-treatment complications were observed. Relevant Post-operative Information:  Disposition: Return to clinic for follow-up evaluation. The patient tolerated the entire procedure well. A repeat set of vitals were taken after the procedure and the patient was kept under observation following institutional  policy, for this procedure. Post-procedural neurological assessment was performed, showing return to baseline, prior to discharge. The patient was discharged home, once institutional criteria were met. The patient was provided with post-procedure discharge instructions, including a section on how to identify potential problems. Should any problems arise concerning this procedure, the patient was given instructions to immediately contact us, at any time, without hesitation. In any case, we plan to contact the patient by telephone for a follow-up status report regarding this interventional procedure. Comments:  No additional relevant information.  Plan: The patient would like to return in 2 weeks for a right-sided cervical epidural steroid injection under fluoroscopic guidance, no sedation.  Primary Care Physician: Ria Bush, MD Location: [facility] Note by: Millie Shorb A. [name], M.D, DABA, DABAPM, DABPM, DABIPP, FIPP  Disclaimer:  Medicine is not an exact science. The only guarantee in medicine is that nothing is guaranteed. It is important to note that the decision to proceed with this intervention was based on the information collected from the patient. The Data and conclusions were drawn from the patient's questionnaire, the interview, and the physical examination. Because the information was provided in large part by the patient, it cannot be guaranteed that it has not been purposely or unconsciously manipulated. Every effort has been made to obtain as much relevant data as possible for this evaluation. It is important to note that the conclusions that lead to this procedure are derived in large part from the available data. Always take into account that the treatment will also be dependent on availability of resources and existing treatment guidelines, considered by other Pain Management Practitioners as being common knowledge and practice, at the time of the  intervention. For Medico-Legal purposes, it is also important to point out that variation in procedural techniques and pharmacological choices are the acceptable norm. The indications, contraindications, technique, and results of the above procedure should only be interpreted and judged by a Board-Certified Interventional Pain Specialist with extensive familiarity and expertise in the same exact procedure and technique. Attempts at providing opinions without similar or greater experience and expertise than that of the treating physician will be considered as inappropriate and unethical, and shall result in a formal complaint to the state medical board and applicable specialty societies.

## 2015-03-22 NOTE — Patient Instructions (Signed)
Epidural Steroid Injection Patient Information  Description: The epidural space surrounds the nerves as they exit the spinal cord.  In some patients, the nerves can be compressed and inflamed by a bulging disc or a tight spinal canal (spinal stenosis).  By injecting steroids into the epidural space, we can bring irritated nerves into direct contact with a potentially helpful medication.  These steroids act directly on the irritated nerves and can reduce swelling and inflammation which often leads to decreased pain.  Epidural steroids may be injected anywhere along the spine and from the neck to the low back depending upon the location of your pain.   After numbing the skin with local anesthetic (like Novocaine), a small needle is passed into the epidural space slowly.  You may experience a sensation of pressure while this is being done.  The entire block usually last less than 10 minutes.  Conditions which may be treated by epidural steroids:   Low back and leg pain  Neck and arm pain  Spinal stenosis  Post-laminectomy syndrome  Herpes zoster (shingles) pain  Pain from compression fractures  Preparation for the injection:  1. Do not eat any solid food or dairy products within 6 hours of your appointment.  2. You may drink clear liquids up to 2 hours before appointment.  Clear liquids include water, black coffee, juice or soda.  No milk or cream please. 3. You may take your regular medication, including pain medications, with a sip of water before your appointment  Diabetics should hold regular insulin (if taken separately) and take 1/2 normal NPH dos the morning of the procedure.  Carry some sugar containing items with you to your appointment. 4. A driver must accompany you and be prepared to drive you home after your procedure.  5. Bring all your current medications with your. 6. An IV may be inserted and sedation may be given at the discretion of the physician.   7. A blood pressure  cuff, EKG and other monitors will often be applied during the procedure.  Some patients may need to have extra oxygen administered for a short period. 8. You will be asked to provide medical information, including your allergies, prior to the procedure.  We must know immediately if you are taking blood thinners (like Coumadin/Warfarin)  Or if you are allergic to IV iodine contrast (dye). We must know if you could possible be pregnant.  Possible side-effects:  Bleeding from needle site  Infection (rare, may require surgery)  Nerve injury (rare)  Numbness & tingling (temporary)  Difficulty urinating (rare, temporary)  Spinal headache ( a headache worse with upright posture)  Light -headedness (temporary)  Pain at injection site (several days)  Decreased blood pressure (temporary)  Weakness in arm/leg (temporary)  Pressure sensation in back/neck (temporary)  Call if you experience:  Fever/chills associated with headache or increased back/neck pain.  Headache worsened by an upright position.  New onset weakness or numbness of an extremity below the injection site  Hives or difficulty breathing (go to the emergency room)  Inflammation or drainage at the infection site  Severe back/neck pain  Any new symptoms which are concerning to you  Please note:  Although the local anesthetic injected can often make your back or neck feel good for several hours after the injection, the pain will likely return.  It takes 3-7 days for steroids to work in the epidural space.  You may not notice any pain relief for at least that one week.    If effective, we will often do a series of three injections spaced 3-6 weeks apart to maximally decrease your pain.  After the initial series, we generally will wait several months before considering a repeat injection of the same type.  If you have any questions, please call [phone] [facility]  Discharge Instructions  General Discharge Instructions :  If you need to reach your doctor call: Monday-Friday 8:00 am - 4:00 pm at [phone] or toll free [phone].  After clinic hours [phone] to have operator reach doctor.  Bring all of your medication bottles to all your appointments in the pain clinic.  To cancel or reschedule your appointment with Pain Management please remember to call 24 hours in advance to avoid a fee.  Refer to the educational materials which you have been given on: General Risks, I had my Procedure. Discharge Instructions, Post Sedation.  Post Procedure Instructions:  The drugs you were given will stay in your system until tomorrow, so for the next 24 hours you should not drive, make any legal decisions or drink any alcoholic beverages.  You may eat anything you prefer, but it is better to start with liquids then soups and crackers, and gradually work up to solid foods.  Please notify your doctor immediately if you have any unusual bleeding, trouble breathing or pain that is not related to your normal pain.  Depending on the type of procedure that was done, some parts of your body may feel week and/or numb.  This usually clears up by tonight or the next day.  Walk with the use of an assistive device or accompanied by an adult for the 24 hours.  You may use ice on the affected area for the first 24 hours.  Put ice in a Ziploc bag and cover with a towel and place against area 15 minutes on 15 minutes off.  You may switch to heat after 24 hours. 

## 2015-03-23 ENCOUNTER — Telehealth: Payer: Self-pay | Admitting: *Deleted

## 2015-03-23 ENCOUNTER — Other Ambulatory Visit: Payer: Self-pay

## 2015-03-23 NOTE — Telephone Encounter (Signed)
No problems post procedure. 

## 2015-03-24 LAB — TOXASSURE SELECT 13 (MW), URINE: PDF: 0

## 2015-04-05 ENCOUNTER — Ambulatory Visit: Payer: Medicare Other | Attending: Pain Medicine | Admitting: Pain Medicine

## 2015-04-05 ENCOUNTER — Encounter: Payer: Self-pay | Admitting: Pain Medicine

## 2015-04-05 VITALS — BP 166/99 | HR 69 | Temp 97.9°F | Resp 16 | Ht <= 58 in | Wt 125.0 lb

## 2015-04-05 DIAGNOSIS — M8580 Other specified disorders of bone density and structure, unspecified site: Secondary | ICD-10-CM | POA: Diagnosis not present

## 2015-04-05 DIAGNOSIS — E039 Hypothyroidism, unspecified: Secondary | ICD-10-CM | POA: Insufficient documentation

## 2015-04-05 DIAGNOSIS — K219 Gastro-esophageal reflux disease without esophagitis: Secondary | ICD-10-CM | POA: Insufficient documentation

## 2015-04-05 DIAGNOSIS — M4802 Spinal stenosis, cervical region: Secondary | ICD-10-CM

## 2015-04-05 DIAGNOSIS — M25519 Pain in unspecified shoulder: Secondary | ICD-10-CM | POA: Diagnosis present

## 2015-04-05 DIAGNOSIS — M47812 Spondylosis without myelopathy or radiculopathy, cervical region: Secondary | ICD-10-CM

## 2015-04-05 DIAGNOSIS — E785 Hyperlipidemia, unspecified: Secondary | ICD-10-CM | POA: Insufficient documentation

## 2015-04-05 DIAGNOSIS — G609 Hereditary and idiopathic neuropathy, unspecified: Secondary | ICD-10-CM | POA: Insufficient documentation

## 2015-04-05 DIAGNOSIS — M545 Low back pain: Secondary | ICD-10-CM | POA: Insufficient documentation

## 2015-04-05 DIAGNOSIS — G8929 Other chronic pain: Secondary | ICD-10-CM

## 2015-04-05 DIAGNOSIS — F411 Generalized anxiety disorder: Secondary | ICD-10-CM | POA: Insufficient documentation

## 2015-04-05 DIAGNOSIS — M5416 Radiculopathy, lumbar region: Secondary | ICD-10-CM | POA: Diagnosis not present

## 2015-04-05 DIAGNOSIS — M5412 Radiculopathy, cervical region: Secondary | ICD-10-CM

## 2015-04-05 DIAGNOSIS — R937 Abnormal findings on diagnostic imaging of other parts of musculoskeletal system: Secondary | ICD-10-CM | POA: Insufficient documentation

## 2015-04-05 DIAGNOSIS — K4090 Unilateral inguinal hernia, without obstruction or gangrene, not specified as recurrent: Secondary | ICD-10-CM | POA: Insufficient documentation

## 2015-04-05 DIAGNOSIS — R531 Weakness: Secondary | ICD-10-CM | POA: Insufficient documentation

## 2015-04-05 DIAGNOSIS — R413 Other amnesia: Secondary | ICD-10-CM | POA: Insufficient documentation

## 2015-04-05 DIAGNOSIS — R7303 Prediabetes: Secondary | ICD-10-CM | POA: Insufficient documentation

## 2015-04-05 DIAGNOSIS — R200 Anesthesia of skin: Secondary | ICD-10-CM | POA: Insufficient documentation

## 2015-04-05 DIAGNOSIS — F39 Unspecified mood [affective] disorder: Secondary | ICD-10-CM | POA: Insufficient documentation

## 2015-04-05 DIAGNOSIS — I10 Essential (primary) hypertension: Secondary | ICD-10-CM | POA: Insufficient documentation

## 2015-04-05 DIAGNOSIS — M4806 Spinal stenosis, lumbar region: Secondary | ICD-10-CM | POA: Insufficient documentation

## 2015-04-05 DIAGNOSIS — M48061 Spinal stenosis, lumbar region without neurogenic claudication: Secondary | ICD-10-CM | POA: Insufficient documentation

## 2015-04-05 DIAGNOSIS — K589 Irritable bowel syndrome without diarrhea: Secondary | ICD-10-CM | POA: Diagnosis not present

## 2015-04-05 MED ORDER — ROPIVACAINE HCL 2 MG/ML IJ SOLN
INTRAMUSCULAR | Status: AC
  Administered 2015-04-05: 11:00:00
  Filled 2015-04-05: qty 10

## 2015-04-05 MED ORDER — IOHEXOL 180 MG/ML  SOLN
INTRAMUSCULAR | Status: AC
  Administered 2015-04-05: 11:00:00
  Filled 2015-04-05: qty 20

## 2015-04-05 MED ORDER — IOHEXOL 180 MG/ML  SOLN: 5.0000 mL | Freq: Once | INTRAMUSCULAR | Status: DC | PRN

## 2015-04-05 MED ORDER — SODIUM CHLORIDE 0.9 % IJ SOLN
INTRAMUSCULAR | Status: AC
  Administered 2015-04-05: 11:00:00
  Filled 2015-04-05: qty 10

## 2015-04-05 MED ORDER — SODIUM CHLORIDE 0.9 % IJ SOLN: 1.0000 mL | Freq: Once | INTRAMUSCULAR | Status: DC

## 2015-04-05 MED ORDER — LIDOCAINE HCL (PF) 1 % IJ SOLN
INTRAMUSCULAR | Status: AC
  Administered 2015-04-05: 11:00:00
  Filled 2015-04-05: qty 5

## 2015-04-05 MED ORDER — DEXAMETHASONE SODIUM PHOSPHATE 10 MG/ML IJ SOLN
INTRAMUSCULAR | Status: AC
  Administered 2015-04-05: 11:00:00
  Filled 2015-04-05: qty 1

## 2015-04-05 MED ORDER — ROPIVACAINE HCL 2 MG/ML IJ SOLN: 1.0000 mL | Freq: Once | INTRAMUSCULAR | Status: DC

## 2015-04-05 MED ORDER — DEXAMETHASONE SODIUM PHOSPHATE 10 MG/ML IJ SOLN: 10.0000 mg | Freq: Once | INTRAMUSCULAR | Status: DC

## 2015-04-05 MED ORDER — LIDOCAINE HCL (PF) 1 % IJ SOLN: 10.0000 mL | Freq: Once | INTRAMUSCULAR | Status: DC

## 2015-04-05 NOTE — Patient Instructions (Signed)
GENERAL RISKS AND COMPLICATIONS  What are the risk, side effects and possible complications? Generally speaking, most procedures are safe.  However, with any procedure there are risks, side effects, and the possibility of complications.  The risks and complications are dependent upon the sites that are lesioned, or the type of nerve block to be performed.  The closer the procedure is to the spine, the more serious the risks are.  Great care is taken when placing the radio frequency needles, block needles or lesioning probes, but sometimes complications can occur. 1. Infection: Any time there is an injection through the skin, there is a risk of infection.  This is why sterile conditions are used for these blocks.  There are four possible types of infection. 1. Localized skin infection. 2. Central Nervous System Infection-This can be in the form of Meningitis, which can be deadly. 3. Epidural Infections-This can be in the form of an epidural abscess, which can cause pressure inside of the spine, causing compression of the spinal cord with subsequent paralysis. This would require an emergency surgery to decompress, and there are no guarantees that the patient would recover from the paralysis. 4. Discitis-This is an infection of the intervertebral discs.  It occurs in about 1% of discography procedures.  It is difficult to treat and it may lead to surgery.        2. Pain: the needles have to go through skin and soft tissues, will cause soreness.       3. Damage to internal structures:  The nerves to be lesioned may be near blood vessels or    other nerves which can be potentially damaged.       4. Bleeding: Bleeding is more common if the patient is taking blood thinners such as  aspirin, Coumadin, Ticiid, Plavix, etc., or if he/she have some genetic predisposition  such as hemophilia. Bleeding into the spinal canal can cause compression of the spinal  cord with subsequent paralysis.  This would require an  emergency surgery to  decompress and there are no guarantees that the patient would recover from the  paralysis.       5. Pneumothorax:  Puncturing of a lung is a possibility, every time a needle is introduced in  the area of the chest or upper back.  Pneumothorax refers to free air around the  collapsed lung(s), inside of the thoracic cavity (chest cavity).  Another two possible  complications related to a similar event would include: Hemothorax and Chylothorax.   These are variations of the Pneumothorax, where instead of air around the collapsed  lung(s), you may have blood or chyle, respectively.       6. Spinal headaches: They may occur with any procedures in the area of the spine.       7. Persistent CSF (Cerebro-Spinal Fluid) leakage: This is a rare problem, but may occur  with prolonged intrathecal or epidural catheters either due to the formation of a fistulous  track or a dural tear.       8. Nerve damage: By working so close to the spinal cord, there is always a possibility of  nerve damage, which could be as serious as a permanent spinal cord injury with  paralysis.       9. Death:  Although rare, severe deadly allergic reactions known as "Anaphylactic  reaction" can occur to any of the medications used.      10. Worsening of the symptoms:  We can always make thing worse.    What are the chances of something like this happening? Chances of any of this occuring are extremely low.  By statistics, you have more of a chance of getting killed in a motor vehicle accident: while driving to the hospital than any of the above occurring .  Nevertheless, you should be aware that they are possibilities.  In general, it is similar to taking a shower.  Everybody knows that you can slip, hit your head and get killed.  Does that mean that you should not shower again?  Nevertheless always keep in mind that statistics do not mean anything if you happen to be on the wrong side of them.  Even if a procedure has a 1  (one) in a 1,000,000 (million) chance of going wrong, it you happen to be that one..Also, keep in mind that by statistics, you have more of a chance of having something go wrong when taking medications.  Who should not have this procedure? If you are on a blood thinning medication (e.g. Coumadin, Plavix, see list of "Blood Thinners"), or if you have an active infection going on, you should not have the procedure.  If you are taking any blood thinners, please inform your physician.  How should I prepare for this procedure?  Do not eat or drink anything at least six hours prior to the procedure.  Bring a driver with you .  It cannot be a taxi.  Come accompanied by an adult that can drive you back, and that is strong enough to help you if your legs get weak or numb from the local anesthetic.  Take all of your medicines the morning of the procedure with just enough water to swallow them.  If you have diabetes, make sure that you are scheduled to have your procedure done first thing in the morning, whenever possible.  If you have diabetes, take only half of your insulin dose and notify our nurse that you have done so as soon as you arrive at the clinic.  If you are diabetic, but only take blood sugar pills (oral hypoglycemic), then do not take them on the morning of your procedure.  You may take them after you have had the procedure.  Do not take aspirin or any aspirin-containing medications, at least eleven (11) days prior to the procedure.  They may prolong bleeding.  Wear loose fitting clothing that may be easy to take off and that you would not mind if it got stained with Betadine or blood.  Do not wear any jewelry or perfume  Remove any nail coloring.  It will interfere with some of our monitoring equipment.  NOTE: Remember that this is not meant to be interpreted as a complete list of all possible complications.  Unforeseen problems may occur.  BLOOD THINNERS The following drugs  contain aspirin or other products, which can cause increased bleeding during surgery and should not be taken for 2 weeks prior to and 1 week after surgery.  If you should need take something for relief of minor pain, you may take acetaminophen which is found in Tylenol,m Datril, Anacin-3 and Panadol. It is not blood thinner. The products listed below are.  Do not take any of the products listed below in addition to any listed on your instruction sheet.  A.P.C or A.P.C with Codeine Codeine Phosphate Capsules #3 Ibuprofen Ridaura  ABC compound Congesprin Imuran rimadil  Advil Cope Indocin Robaxisal  Alka-Seltzer Effervescent Pain Reliever and Antacid Coricidin or Coricidin-D  Indomethacin Rufen    Alka-Seltzer plus Cold Medicine Cosprin Ketoprofen S-A-C Tablets  Anacin Analgesic Tablets or Capsules Coumadin Korlgesic Salflex  Anacin Extra Strength Analgesic tablets or capsules CP-2 Tablets Lanoril Salicylate  Anaprox Cuprimine Capsules Levenox Salocol  Anexsia-D Dalteparin Magan Salsalate  Anodynos Darvon compound Magnesium Salicylate Sine-off  Ansaid Dasin Capsules Magsal Sodium Salicylate  Anturane Depen Capsules Marnal Soma  APF Arthritis pain formula Dewitt's Pills Measurin Stanback  Argesic Dia-Gesic Meclofenamic Sulfinpyrazone  Arthritis Bayer Timed Release Aspirin Diclofenac Meclomen Sulindac  Arthritis pain formula Anacin Dicumarol Medipren Supac  Analgesic (Safety coated) Arthralgen Diffunasal Mefanamic Suprofen  Arthritis Strength Bufferin Dihydrocodeine Mepro Compound Suprol  Arthropan liquid Dopirydamole Methcarbomol with Aspirin Synalgos  ASA tablets/Enseals Disalcid Micrainin Tagament  Ascriptin Doan's Midol Talwin  Ascriptin A/D Dolene Mobidin Tanderil  Ascriptin Extra Strength Dolobid Moblgesic Ticlid  Ascriptin with Codeine Doloprin or Doloprin with Codeine Momentum Tolectin  Asperbuf Duoprin Mono-gesic Trendar  Aspergum Duradyne Motrin or Motrin IB Triminicin  Aspirin  plain, buffered or enteric coated Durasal Myochrisine Trigesic  Aspirin Suppositories Easprin Nalfon Trillsate  Aspirin with Codeine Ecotrin Regular or Extra Strength Naprosyn Uracel  Atromid-S Efficin Naproxen Ursinus  Auranofin Capsules Elmiron Neocylate Vanquish  Axotal Emagrin Norgesic Verin  Azathioprine Empirin or Empirin with Codeine Normiflo Vitamin E  Azolid Emprazil Nuprin Voltaren  Bayer Aspirin plain, buffered or children's or timed BC Tablets or powders Encaprin Orgaran Warfarin Sodium  Buff-a-Comp Enoxaparin Orudis Zorpin  Buff-a-Comp with Codeine Equegesic Os-Cal-Gesic   Buffaprin Excedrin plain, buffered or Extra Strength Oxalid   Bufferin Arthritis Strength Feldene Oxphenbutazone   Bufferin plain or Extra Strength Feldene Capsules Oxycodone with Aspirin   Bufferin with Codeine Fenoprofen Fenoprofen Pabalate or Pabalate-SF   Buffets II Flogesic Panagesic   Buffinol plain or Extra Strength Florinal or Florinal with Codeine Panwarfarin   Buf-Tabs Flurbiprofen Penicillamine   Butalbital Compound Four-way cold tablets Penicillin   Butazolidin Fragmin Pepto-Bismol   Carbenicillin Geminisyn Percodan   Carna Arthritis Reliever Geopen Persantine   Carprofen Gold's salt Persistin   Chloramphenicol Goody's Phenylbutazone   Chloromycetin Haltrain Piroxlcam   Clmetidine heparin Plaquenil   Cllnoril Hyco-pap Ponstel   Clofibrate Hydroxy chloroquine Propoxyphen         Before stopping any of these medications, be sure to consult the physician who ordered them.  Some, such as Coumadin (Warfarin) are ordered to prevent or treat serious conditions such as "deep thrombosis", "pumonary embolisms", and other heart problems.  The amount of time that you may need off of the medication may also vary with the medication and the reason for which you were taking it.  If you are taking any of these medications, please make sure you notify your pain physician before you undergo any  procedures.         Pain Management Discharge Instructions  General Discharge Instructions :  If you need to reach your doctor call: Monday-Friday 8:00 am - 4:00 pm at [phone] or toll free [phone].  After clinic hours [phone] to have operator reach doctor.  Bring all of your medication bottles to all your appointments in the pain clinic.  To cancel or reschedule your appointment with Pain Management please remember to call 24 hours in advance to avoid a fee.  Refer to the educational materials which you have been given on: General Risks, I had my Procedure. Discharge Instructions, Post Sedation.  Post Procedure Instructions:  The drugs you were given will stay in your system until tomorrow, so for the next 24 hours you should   not drive, make any legal decisions or drink any alcoholic beverages.  You may eat anything you prefer, but it is better to start with liquids then soups and crackers, and gradually work up to solid foods.  Please notify your doctor immediately if you have any unusual bleeding, trouble breathing or pain that is not related to your normal pain.  Depending on the type of procedure that was done, some parts of your body may feel week and/or numb.  This usually clears up by tonight or the next day.  Walk with the use of an assistive device or accompanied by an adult for the 24 hours.  You may use ice on the affected area for the first 24 hours.  Put ice in a Ziploc bag and cover with a towel and place against area 15 minutes on 15 minutes off.  You may switch to heat after 24 hours.GENERAL RISKS AND COMPLICATIONS  What are the risk, side effects and possible complications? Generally speaking, most procedures are safe.  However, with any procedure there are risks, side effects, and the possibility of complications.  The risks and complications are dependent upon the sites that are lesioned, or the type of nerve block to be performed.  The closer  the procedure is to the spine, the more serious the risks are.  Great care is taken when placing the radio frequency needles, block needles or lesioning probes, but sometimes complications can occur. 2. Infection: Any time there is an injection through the skin, there is a risk of infection.  This is why sterile conditions are used for these blocks.  There are four possible types of infection. 1. Localized skin infection. 2. Central Nervous System Infection-This can be in the form of Meningitis, which can be deadly. 3. Epidural Infections-This can be in the form of an epidural abscess, which can cause pressure inside of the spine, causing compression of the spinal cord with subsequent paralysis. This would require an emergency surgery to decompress, and there are no guarantees that the patient would recover from the paralysis. 4. Discitis-This is an infection of the intervertebral discs.  It occurs in about 1% of discography procedures.  It is difficult to treat and it may lead to surgery.        2. Pain: the needles have to go through skin and soft tissues, will cause soreness.       3. Damage to internal structures:  The nerves to be lesioned may be near blood vessels or    other nerves which can be potentially damaged.       4. Bleeding: Bleeding is more common if the patient is taking blood thinners such as  aspirin, Coumadin, Ticiid, Plavix, etc., or if he/she have some genetic predisposition  such as hemophilia. Bleeding into the spinal canal can cause compression of the spinal  cord with subsequent paralysis.  This would require an emergency surgery to  decompress and there are no guarantees that the patient would recover from the  paralysis.       5. Pneumothorax:  Puncturing of a lung is a possibility, every time a needle is introduced in  the area of the chest or upper back.  Pneumothorax refers to free air around the  collapsed lung(s), inside of the thoracic cavity (chest cavity).  Another two  possible  complications related to a similar event would include: Hemothorax and Chylothorax.   These are variations of the Pneumothorax, where instead of air around the collapsed  lung(s), you may have blood or chyle, respectively.       6. Spinal headaches: They may occur with any procedures in the area of the spine.       7. Persistent CSF (Cerebro-Spinal Fluid) leakage: This is a rare problem, but may occur  with prolonged intrathecal or epidural catheters either due to the formation of a fistulous  track or a dural tear.       8. Nerve damage: By working so close to the spinal cord, there is always a possibility of  nerve damage, which could be as serious as a permanent spinal cord injury with  paralysis.       9. Death:  Although rare, severe deadly allergic reactions known as "Anaphylactic  reaction" can occur to any of the medications used.      10. Worsening of the symptoms:  We can always make thing worse.  What are the chances of something like this happening? Chances of any of this occuring are extremely low.  By statistics, you have more of a chance of getting killed in a motor vehicle accident: while driving to the hospital than any of the above occurring .  Nevertheless, you should be aware that they are possibilities.  In general, it is similar to taking a shower.  Everybody knows that you can slip, hit your head and get killed.  Does that mean that you should not shower again?  Nevertheless always keep in mind that statistics do not mean anything if you happen to be on the wrong side of them.  Even if a procedure has a 1 (one) in a 1,000,000 (million) chance of going wrong, it you happen to be that one..Also, keep in mind that by statistics, you have more of a chance of having something go wrong when taking medications.  Who should not have this procedure? If you are on a blood thinning medication (e.g. Coumadin, Plavix, see list of "Blood Thinners"), or if you have an active infection  going on, you should not have the procedure.  If you are taking any blood thinners, please inform your physician.  How should I prepare for this procedure?  Do not eat or drink anything at least six hours prior to the procedure.  Bring a driver with you .  It cannot be a taxi.  Come accompanied by an adult that can drive you back, and that is strong enough to help you if your legs get weak or numb from the local anesthetic.  Take all of your medicines the morning of the procedure with just enough water to swallow them.  If you have diabetes, make sure that you are scheduled to have your procedure done first thing in the morning, whenever possible.  If you have diabetes, take only half of your insulin dose and notify our nurse that you have done so as soon as you arrive at the clinic.  If you are diabetic, but only take blood sugar pills (oral hypoglycemic), then do not take them on the morning of your procedure.  You may take them after you have had the procedure.  Do not take aspirin or any aspirin-containing medications, at least eleven (11) days prior to the procedure.  They may prolong bleeding.  Wear loose fitting clothing that may be easy to take off and that you would not mind if it got stained with Betadine or blood.  Do not wear any jewelry or perfume  Remove any nail coloring.  It will  interfere with some of our monitoring equipment.  NOTE: Remember that this is not meant to be interpreted as a complete list of all possible complications.  Unforeseen problems may occur.  BLOOD THINNERS The following drugs contain aspirin or other products, which can cause increased bleeding during surgery and should not be taken for 2 weeks prior to and 1 week after surgery.  If you should need take something for relief of minor pain, you may take acetaminophen which is found in Tylenol,m Datril, Anacin-3 and Panadol. It is not blood thinner. The products listed below are.  Do not take any of  the products listed below in addition to any listed on your instruction sheet.  A.P.C or A.P.C with Codeine Codeine Phosphate Capsules #3 Ibuprofen Ridaura  ABC compound Congesprin Imuran rimadil  Advil Cope Indocin Robaxisal  Alka-Seltzer Effervescent Pain Reliever and Antacid Coricidin or Coricidin-D  Indomethacin Rufen  Alka-Seltzer plus Cold Medicine Cosprin Ketoprofen S-A-C Tablets  Anacin Analgesic Tablets or Capsules Coumadin Korlgesic Salflex  Anacin Extra Strength Analgesic tablets or capsules CP-2 Tablets Lanoril Salicylate  Anaprox Cuprimine Capsules Levenox Salocol  Anexsia-D Dalteparin Magan Salsalate  Anodynos Darvon compound Magnesium Salicylate Sine-off  Ansaid Dasin Capsules Magsal Sodium Salicylate  Anturane Depen Capsules Marnal Soma  APF Arthritis pain formula Dewitt's Pills Measurin Stanback  Argesic Dia-Gesic Meclofenamic Sulfinpyrazone  Arthritis Bayer Timed Release Aspirin Diclofenac Meclomen Sulindac  Arthritis pain formula Anacin Dicumarol Medipren Supac  Analgesic (Safety coated) Arthralgen Diffunasal Mefanamic Suprofen  Arthritis Strength Bufferin Dihydrocodeine Mepro Compound Suprol  Arthropan liquid Dopirydamole Methcarbomol with Aspirin Synalgos  ASA tablets/Enseals Disalcid Micrainin Tagament  Ascriptin Doan's Midol Talwin  Ascriptin A/D Dolene Mobidin Tanderil  Ascriptin Extra Strength Dolobid Moblgesic Ticlid  Ascriptin with Codeine Doloprin or Doloprin with Codeine Momentum Tolectin  Asperbuf Duoprin Mono-gesic Trendar  Aspergum Duradyne Motrin or Motrin IB Triminicin  Aspirin plain, buffered or enteric coated Durasal Myochrisine Trigesic  Aspirin Suppositories Easprin Nalfon Trillsate  Aspirin with Codeine Ecotrin Regular or Extra Strength Naprosyn Uracel  Atromid-S Efficin Naproxen Ursinus  Auranofin Capsules Elmiron Neocylate Vanquish  Axotal Emagrin Norgesic Verin  Azathioprine Empirin or Empirin with Codeine Normiflo Vitamin E  Azolid  Emprazil Nuprin Voltaren  Bayer Aspirin plain, buffered or children's or timed BC Tablets or powders Encaprin Orgaran Warfarin Sodium  Buff-a-Comp Enoxaparin Orudis Zorpin  Buff-a-Comp with Codeine Equegesic Os-Cal-Gesic   Buffaprin Excedrin plain, buffered or Extra Strength Oxalid   Bufferin Arthritis Strength Feldene Oxphenbutazone   Bufferin plain or Extra Strength Feldene Capsules Oxycodone with Aspirin   Bufferin with Codeine Fenoprofen Fenoprofen Pabalate or Pabalate-SF   Buffets II Flogesic Panagesic   Buffinol plain or Extra Strength Florinal or Florinal with Codeine Panwarfarin   Buf-Tabs Flurbiprofen Penicillamine   Butalbital Compound Four-way cold tablets Penicillin   Butazolidin Fragmin Pepto-Bismol   Carbenicillin Geminisyn Percodan   Carna Arthritis Reliever Geopen Persantine   Carprofen Gold's salt Persistin   Chloramphenicol Goody's Phenylbutazone   Chloromycetin Haltrain Piroxlcam   Clmetidine heparin Plaquenil   Cllnoril Hyco-pap Ponstel   Clofibrate Hydroxy chloroquine Propoxyphen         Before stopping any of these medications, be sure to consult the physician who ordered them.  Some, such as Coumadin (Warfarin) are ordered to prevent or treat serious conditions such as "deep thrombosis", "pumonary embolisms", and other heart problems.  The amount of time that you may need off of the medication may also vary with the medication and the reason for which you were taking  it.  If you are taking any of these medications, please make sure you notify your pain physician before you undergo any procedures.

## 2015-04-05 NOTE — Progress Notes (Signed)
Safety precautions to be maintained throughout the outpatient stay will include: orient to surroundings, keep bed in low position, maintain call bell within reach at all times, provide assistance with transfer out of bed and ambulation.   Patient here for cervical epidural.  No refills needed

## 2015-04-05 NOTE — Progress Notes (Signed)
Patient's Name: Cheyenne Pierce MRN: [id number] DOB: 04-04-1935 DOS: 04/05/2015  Primary Reason(s) for Visit: Interventional Pain Management Treatment, and post procedure evaluation. CC: Shoulder Pain   Pre-Procedure Assessment:  Cheyenne Pierce is a 80 y.o. year old, female patient, seen today for interventional treatment. She has Hypothyroidism; GAD (generalized anxiety disorder); Hereditary and idiopathic peripheral neuropathy; Essential hypertension; Reflux esophagitis; GERD; INGUINAL HERNIA, LEFT; Irritable bowel syndrome; OTHER CHRONIC CYSTITIS; Osteopenia; Frontal sinus pain; Systolic murmur; Medicare annual wellness visit, subsequent; PVCs (premature ventricular contractions); Prediabetes; Headache; Dizziness; Mood swings ([place]); Memory deficit; Hair thinning; Advanced care planning/counseling discussion; HLD (hyperlipidemia); Health maintenance examination; Recurrent falls; Night sweats; Chronic pain; Encounter for therapeutic drug level monitoring; Encounter for chronic pain management; Chronic low back pain; Chronic neck pain (R>L); Cervical spondylosis; Chronic cervical radicular pain (Bilateral) (R>L); Numbness of upper extremity (Bilateral) (R>L); Upper extremity weakness (Bilateral); Neurogenic pain; Neuropathic pain; Inflammatory pain; Chronic Lumbar Radicular pain (Left); Allodynia; Encounter for long-term current use of medication; Lumbar spondylosis; Chronic Lumbar Radiculopathy (Left); Chronic lower extremity pain (Bilateral) (L>R); Chronic hip pain (Right); Abnormal Cervical MRI (01/24/15); Cervical spinal stenosis (C3 through C7); Cervical facet hypertrophy; Cervical foraminal stenosis (C3-4 through C6-7) (R>L); Chronic cervical radiculopathy (Bilateral) (R>L) (pain, numbness, and weakness); Abnormal Lumbar MRI (11/10/2014); Lumbar spinal stenosis (L3-4); and Lumbar foraminal stenosis (Bilateral L3-4) (Right L4-5) on her problem list.. Her primarily concern today is the Shoulder Pain  She  still pending neurosurgical evaluation for her cervical spinal stenosis by Dr. Deri Fuelling.  Procedure Assessment:   Procedure done on last visit: Left L4-5 lumbar epidural steroid injection under fluoroscopic guidance, no sedation. Side-effects or Adverse reactions: None reported Sedation: No sedation. Results: Ultra-Short Term Relief (First 1 hour after procedure): 0 %  This particular result would argue against pain coming from this area in particular. Short Term Relief (Initial 4-6 hrs after procedure): 0 % This was result would argue against pain coming from this particular area. Long Term Relief : 40 % Long-term benefit would suggest an inflammatory etiology to the pain. This anti-inflammatory benefit might obtain systemic. Current Relief (Now):  40%  Persistent relief would suggest effective anti-inflammatory effects from steroids had a systemic level. Interpretation of Results: I am not impressed by these results in particular. The patient indicates feeling much better, but as seen above, the epidural placement of the medicine at the L4-5 level on the left side and the results from the local anesthetic would suggest that this is not where the pain is coming from. She did get some benefit but this might obtain systemic effect from the steroids.   Today's Pain Score: 6  Reported level of pain is incompatible with clinical obrservations. This may be secondary to a possible lack of understanding on how the pain scale works. Pain Type: Chronic pain Pain Location: Shoulder Pain Orientation:  (midline of both shoulders) Pain Descriptors / Indicators: Constant Pain Frequency: Constant  Date of Last Visit: 03/22/15 Service Provided on Last Visit: Procedure  Verification of the correct person, correct site (including marking of site), and correct procedure were performed and confirmed by the patient.  Today's Vitals   04/05/15 1053 04/05/15 1127 04/05/15 1135 04/05/15 1139  BP: 146/75  189/79 168/81 166/99  Pulse: 71 66 68 69  Temp: 97.9 F (36.6 C)     TempSrc: Oral     Resp: '16 18 18 16  ' Height: '4\' 9"'  (1.448 m)     Weight: 125 lb (56.7 kg)     SpO2:  100% 98% 96% 96%  PainSc: '6   6  6   ' Calculated BMI: Body mass index is 27.04 kg/(m^2). Allergies: She is allergic to zoloft; amoxicillin; codeine phosphate; metronidazole; oxcarbazepine; perindopril erbumine; potassium-containing compounds; prednisone; prozac; and tramadol hcl.. Primary Diagnosis: Chronic cervical radiculopathy [M54.12]  Procedure:  Type: Diagnostic Inter-Laminar Epidural Steroid Injection Region: Posterior Cervical Level: C7-T1  Laterality: Right-Sided Paraspinal  Indications: Spondylosis, Cervical Region  Consent: Secured. Under the influence of no sedatives a written informed consent was obtained, after having provided information on the risks and possible complications. To fulfill our ethical and legal obligations, as recommended by the American Medical Association's Code of Ethics, we have provided information to the patient about our clinical impression; the nature and purpose of the treatment or procedure; the risks, benefits, and possible complications of the intervention; alternatives; the risk(s) and benefit(s) of the alternative treatment(s) or procedure(s); and the risk(s) and benefit(s) of doing nothing. The patient was provided information about the risks and possible complications associated with the procedure. In the case of spinal procedures these may include, but are not limited to, failure to achieve desired goals, infection, bleeding, organ or nerve damage, allergic reactions, paralysis, and death. In addition, the patient was informed that Medicine is not an exact science; therefore, there is also the possibility of unforeseen risks and possible complications that may result in a catastrophic outcome. The patient indicated having understood very clearly. We have given the patient no  guarantees and we have made no promises. Enough time was given to the patient to ask questions, all of which were answered to the patient's satisfaction.  Pre-Procedure Preparation: Safety Precautions: Allergies reviewed. Appropriate site, procedure, and patient were confirmed by following the Joint Commission's Universal Protocol (UP.01.01.01), in the form of a "Time Out". The patient was asked to confirm marked site and procedure, before commencing. The patient was asked about blood thinners, or active infections, both of which were denied. Patient was assessed for positional comfort and all pressure points were checked before starting procedure. Monitoring:  As per clinic protocol. Infection Control Precautions: Sterile technique used. Standard Universal Precautions were taken as recommended by the Department of [facility] for Disease Control and Prevention (CDC). Standard pre-surgical skin prep was conducted. Respiratory hygiene and cough etiquette was practiced. Hand hygiene observed. Safe injection practices and needle disposal techniques followed. SDV (single dose vial) medications used. Medications properly checked for expiration dates and contaminants. Personal protective equipment (PPE) used: Surgical mask. Sterile double glove technique. Radiation resistant gloves. Sterile surgical gloves.  Anesthesia, Analgesia, Anxiolysis: Type: Local Anesthesia Local Anesthetic: Lidocaine 1% Route: Subcutaneous IV Access: Declined.", Sedation: Declined.  Indication(s):Not applicable.  Description of Procedure Process:  Time-out: "Time-out" completed before starting procedure, as per protocol. Position: Prone with head of the table was raised to facilitate breathing. Target Area: For Epidural Steroid injections the target is the interlaminar space, initially targeting the lower border of the superior vertebral body lamina. Approach: Paramedial approach. Area Prepped: Entire  PosteriorCervical Region Prepping solution: ChloraPrep (2% chlorhexidine gluconate and 70% isopropyl alcohol) Safety Precautions: Aspiration looking for blood return was conducted prior to all injections. At no point did we inject any substances, as a needle was being advanced. No attempts were made at seeking any paresthesias. Safe injection practices and needle disposal techniques used. Medications properly checked for expiration dates. SDV (single dose vial) medications used. Description of the Procedure: Protocol guidelines were followed. The procedure needle was introduced through the skin, ipsilateral to the  reported pain, and advanced to the target area. Bone was contacted and the needle walked caudad, until the lamina was cleared. The epidural space was identified using "loss-of-resistance technique" with 2-3 ml of PF-NaCl (0.9% NSS), in a 5cc LOR glass syringe. EBL: None Materials & Medications Used:  Needle(s) Used: 20g - 10cm, Tuohy-style epidural needle Medications Administered today: We administered ropivacaine (PF) 2 mg/ml (0.2%), iohexol, lidocaine (PF), sodium chloride, and dexamethasone.Please see chart orders for dosing details.  Imaging Guidance:  Type of Imaging Technique: Fluoroscopy Guidance (Spinal) Indication(s): Assistance in needle guidance and placement for procedures requiring needle placement in or near specific anatomical locations not easily accessible without such assistance. Exposure Time: Please see chart for details. Contrast: Before injecting any contrast, we confirmed that the patient did not have an allergy to iodine, shellfish, or radiological contrast. Once satisfactory needle placement was completed at the desired level, radiological contrast was injected. Injection was conducted under continuous fluoroscopic guidance. Injection of contrast accomplished without complications. See chart for type and volume of contrast used. Fluoroscopic Guidance: I was personally  present in the fluoroscopy suite, where the patient was placed in position for the procedure, over the fluoroscopy-compatible table. Fluoroscopy was manipulated, using "Tunnel Vision Technique", to obtain the best possible view of the target area, on the affected side. Parallax error was corrected before commencing the procedure. A "direction-depth-direction" technique was used to introduce the needle under continuous pulsed fluoroscopic guidance. Once the target was reached, antero-posterior, oblique, and lateral fluoroscopic projection views were taken to confirm needle placement in all planes. Permanently recorded images stored by scanning into EMR. Interpretation: Intraoperative imaging interpretation by performing Physician. Adequate needle placement confirmed. Adequate needle placement confirmed in AP, lateral, & Oblique Views. Appropriate spread of contrast to desired area. No evidence of afferent or efferent intravascular uptake. No intrathecal or subarachnoid spread observed. Permanent hardcopy images in multiple planes scanned into the patient's record.  Antibiotics:  Type:  Antibiotics Given (last 72 hours)    None      Indication(s): No indications identified.  Post-operative Assessment:  Complications: No immediate post-treatment complications were observed. Disposition: Return to clinic for follow-up evaluation. The patient tolerated the entire procedure well. A repeat set of vitals were taken after the procedure and the patient was kept under observation following institutional policy, for this procedure. Post-procedural neurological assessment was performed, showing return to baseline, prior to discharge. The patient was discharged home, once institutional criteria were met. The patient was provided with post-procedure discharge instructions, including a section on how to identify potential problems. Should any problems arise concerning this procedure, the patient was given instructions  to immediately contact us, at any time, without hesitation. In any case, we plan to contact the patient by telephone for a follow-up status report regarding this interventional procedure. Comments:  No additional relevant information.  Primary Care Physician: Ria Bush, MD Location: [facility] Note by: Joe Tanney A. [name], M.D, DABA, DABAPM, DABPM, DABIPP, FIPP  Disclaimer:  Medicine is not an exact science. The only guarantee in medicine is that nothing is guaranteed. It is important to note that the decision to proceed with this intervention was based on the information collected from the patient. The Data and conclusions were drawn from the patient's questionnaire, the interview, and the physical examination. Because the information was provided in large part by the patient, it cannot be guaranteed that it has not been purposely or unconsciously manipulated. Every effort has been made to obtain as  much relevant data as possible for this evaluation. It is important to note that the conclusions that lead to this procedure are derived in large part from the available data. Always take into account that the treatment will also be dependent on availability of resources and existing treatment guidelines, considered by other Pain Management Practitioners as being common knowledge and practice, at the time of the intervention. For Medico-Legal purposes, it is also important to point out that variation in procedural techniques and pharmacological choices are the acceptable norm. The indications, contraindications, technique, and results of the above procedure should only be interpreted and judged by a Board-Certified Interventional Pain Specialist with extensive familiarity and expertise in the same exact procedure and technique. Attempts at providing opinions without similar or greater experience and expertise than that of the treating physician will be considered as  inappropriate and unethical, and shall result in a formal complaint to the state medical board and applicable specialty societies.

## 2015-04-10 ENCOUNTER — Telehealth: Payer: Self-pay | Admitting: *Deleted

## 2015-04-10 NOTE — Telephone Encounter (Signed)
Called and left voicemail re; procedure on last week, to call our office if she has any questions or concerns.

## 2015-04-12 ENCOUNTER — Emergency Department
Admission: EM | Admit: 2015-04-12 | Discharge: 2015-04-12 | Disposition: A | Discharge status: Home / Self Care | Payer: Medicare Other | Attending: Emergency Medicine | Admitting: Emergency Medicine

## 2015-04-12 ENCOUNTER — Other Ambulatory Visit: Payer: Self-pay

## 2015-04-12 ENCOUNTER — Emergency Department: Payer: Medicare Other

## 2015-04-12 ENCOUNTER — Encounter: Payer: Self-pay | Admitting: Emergency Medicine

## 2015-04-12 DIAGNOSIS — Y998 Other external cause status: Secondary | ICD-10-CM | POA: Diagnosis not present

## 2015-04-12 DIAGNOSIS — I10 Essential (primary) hypertension: Secondary | ICD-10-CM | POA: Diagnosis not present

## 2015-04-12 DIAGNOSIS — Y9389 Activity, other specified: Secondary | ICD-10-CM | POA: Insufficient documentation

## 2015-04-12 DIAGNOSIS — Y92410 Unspecified street and highway as the place of occurrence of the external cause: Secondary | ICD-10-CM | POA: Insufficient documentation

## 2015-04-12 DIAGNOSIS — Z7982 Long term (current) use of aspirin: Secondary | ICD-10-CM | POA: Diagnosis not present

## 2015-04-12 DIAGNOSIS — S199XXA Unspecified injury of neck, initial encounter: Secondary | ICD-10-CM | POA: Diagnosis present

## 2015-04-12 DIAGNOSIS — Z87891 Personal history of nicotine dependence: Secondary | ICD-10-CM | POA: Diagnosis not present

## 2015-04-12 DIAGNOSIS — Z880 Allergy status to penicillin: Secondary | ICD-10-CM | POA: Insufficient documentation

## 2015-04-12 DIAGNOSIS — S161XXA Strain of muscle, fascia and tendon at neck level, initial encounter: Secondary | ICD-10-CM | POA: Diagnosis not present

## 2015-04-12 DIAGNOSIS — S20219A Contusion of unspecified front wall of thorax, initial encounter: Secondary | ICD-10-CM | POA: Insufficient documentation

## 2015-04-12 DIAGNOSIS — Z79899 Other long term (current) drug therapy: Secondary | ICD-10-CM | POA: Diagnosis not present
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
## 2015-04-12 NOTE — ED Notes (Signed)
Pt discharged to home.  Family member driving.  Discharge instructions reviewed.  Verbalized understanding.  No questions or concerns at this time.  Teach back verified.  Pt in NAD.  No items left in ED.   

## 2015-04-12 NOTE — Discharge Instructions (Signed)
Cervical Sprain A cervical sprain is an injury in the neck in which the strong, fibrous tissues (ligaments) that connect your neck bones stretch or tear. Cervical sprains can range from mild to severe. Severe cervical sprains can cause the neck vertebrae to be unstable. This can lead to damage of the spinal cord and can result in serious nervous system problems. The amount of time it takes for a cervical sprain to get better depends on the cause and extent of the injury. Most cervical sprains heal in 1 to 3 weeks. CAUSES  Severe cervical sprains may be caused by:   Contact sport injuries (such as from football, rugby, wrestling, hockey, auto racing, gymnastics, diving, martial arts, or boxing).   Motor vehicle collisions.   Whiplash injuries. This is an injury from a sudden forward and backward whipping movement of the head and neck.  Falls.  Mild cervical sprains may be caused by:   Being in an awkward position, such as while cradling a telephone between your ear and shoulder.   Sitting in a chair that does not offer proper support.   Working at a poorly Landscape [employer] station.   Looking up or down for long periods of time.  SYMPTOMS   Pain, soreness, stiffness, or a burning sensation in the front, back, or sides of the neck. This discomfort may develop immediately after the injury or slowly, 24 hours or more after the injury.   Pain or tenderness directly in the middle of the back of the neck.   Shoulder or upper back pain.   Limited ability to move the neck.   Headache.   Dizziness.   Weakness, numbness, or tingling in the hands or arms.   Muscle spasms.   Difficulty swallowing or chewing.   Tenderness and swelling of the neck.  DIAGNOSIS  Most of the time your health care provider can diagnose a cervical sprain by taking your history and doing a physical exam. Your health care provider will ask about previous neck injuries and any known neck  problems, such as arthritis in the neck. X-rays may be taken to find out if there are any other problems, such as with the bones of the neck. Other tests, such as a CT scan or MRI, may also be needed.  TREATMENT  Treatment depends on the severity of the cervical sprain. Mild sprains can be treated with rest, keeping the neck in place (immobilization), and pain medicines. Severe cervical sprains are immediately immobilized. Further treatment is done to help with pain, muscle spasms, and other symptoms and may include:  Medicines, such as pain relievers, numbing medicines, or muscle relaxants.   Physical therapy. This may involve stretching exercises, strengthening exercises, and posture training. Exercises and improved posture can help stabilize the neck, strengthen muscles, and help stop symptoms from returning.  HOME CARE INSTRUCTIONS   Put ice on the injured area.   Put ice in a plastic bag.   Place a towel between your skin and the bag.   Leave the ice on for 15-20 minutes, 3-4 times a day.   If your injury was severe, you may have been given a cervical collar to wear. A cervical collar is a two-piece collar designed to keep your neck from moving while it heals.  Do not remove the collar unless instructed by your health care provider.  If you have long hair, keep it outside of the collar.  Ask your health care provider before making any adjustments to your collar. Minor  adjustments may be required over time to improve comfort and reduce pressure on your chin or on the back of your head.  Ifyou are allowed to remove the collar for cleaning or bathing, follow your health care provider's instructions on how to do so safely.  Keep your collar clean by wiping it with mild soap and water and drying it completely. If the collar you have been given includes removable pads, remove them every 1-2 days and hand wash them with soap and water. Allow them to air dry. They should be completely  dry before you wear them in the collar.  If you are allowed to remove the collar for cleaning and bathing, wash and dry the skin of your neck. Check your skin for irritation or sores. If you see any, tell your health care provider.  Do not drive while wearing the collar.   Only take over-the-counter or prescription medicines for pain, discomfort, or fever as directed by your health care provider.   Keep all follow-up appointments as directed by your health care provider.   Keep all physical therapy appointments as directed by your health care provider.   Make any needed adjustments to your workstation to promote good posture.   Avoid positions and activities that make your symptoms worse.   Warm up and stretch before being active to help prevent problems.  SEEK MEDICAL CARE IF:   Your pain is not controlled with medicine.   You are unable to decrease your pain medicine over time as planned.   Your activity level is not improving as expected.  SEEK IMMEDIATE MEDICAL CARE IF:   You develop any bleeding.  You develop stomach upset.  You have signs of an allergic reaction to your medicine.   Your symptoms get worse.   You develop new, unexplained symptoms.   You have numbness, tingling, weakness, or paralysis in any part of your body.  MAKE SURE YOU:   Understand these instructions.  Will watch your condition.  Will get help right away if you are not doing well or get worse.   This information is not intended to replace advice given to you by your health care provider. Make sure you discuss any questions you have with your health care provider.   Document Released: 01/19/2007 Document Revised: 03/29/2013 Document Reviewed: 09/29/2012 Elsevier Interactive Patient Education 2016 [place].  Blunt Chest Trauma Blunt chest trauma is an injury caused by a blow to the chest. These chest injuries can be very painful. Blunt chest trauma often results in  bruised or broken (fractured) ribs. Most cases of bruised and fractured ribs from blunt chest traumas get better after 1 to 3 weeks of rest and pain medicine. Often, the soft tissue in the chest wall is also injured, causing pain and bruising. Internal organs, such as the heart and lungs, may also be injured. Blunt chest trauma can lead to serious medical problems. This injury requires immediate medical care. CAUSES   Motor vehicle collisions.  Falls.  Physical violence.  Sports injuries. SYMPTOMS   Chest pain. The pain may be worse when you move or breathe deeply.  Shortness of breath.  Lightheadedness.  Bruising.  Tenderness.  Swelling. DIAGNOSIS  Your caregiver will do a physical exam. X-rays may be taken to look for fractures. However, minor rib fractures may not show up on X-rays until a few days after the injury. If a more serious injury is suspected, further imaging tests may be done. This may include ultrasounds, computed  tomography (CT) scans, or magnetic resonance imaging (MRI). TREATMENT  Treatment depends on the severity of your injury. Your caregiver may prescribe pain medicines and deep breathing exercises. HOME CARE INSTRUCTIONS  Limit your activities until you can move around without much pain.  Do not do any strenuous work until your injury is healed.  Put ice on the injured area.  Put ice in a plastic bag.  Place a towel between your skin and the bag.  Leave the ice on for 15-20 minutes, 03-04 times a day.  You may wear a rib belt as directed by your caregiver to reduce pain.  Practice deep breathing as directed by your caregiver to keep your lungs clear.  Only take over-the-counter or prescription medicines for pain, fever, or discomfort as directed by your caregiver. SEEK IMMEDIATE MEDICAL CARE IF:   You have increasing pain or shortness of breath.  You cough up blood.  You have nausea, vomiting, or abdominal pain.  You have a fever.  You  feel dizzy, weak, or you faint. MAKE SURE YOU:  Understand these instructions.  Will watch your condition.  Will get help right away if you are not doing well or get worse.   This information is not intended to replace advice given to you by your health care provider. Make sure you discuss any questions you have with your health care provider.   Document Released: 05/01/2004 Document Revised: 04/14/2014 Document Reviewed: 09/20/2014 Elsevier Interactive Patient Education [employer].
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
## 2015-04-12 NOTE — ED Provider Notes (Signed)
Reviewed and interpreted by me as the EKG EKG time 2052 Ventricular rate 75 QRS 96 QTc 440 PR 140 Probable left ventricular hypertrophy is notable, no acute ischemic changes noted in the patient's slight T wave abnormality seen in lateral leads is felt likely due to underlying LVH. As compared with previous EKG from August 2014, no significant changes noted  Delman Kitten, MD 04/12/15 2212

## 2015-04-19 ENCOUNTER — Ambulatory Visit: Payer: Medicare Other | Admitting: Family Medicine

## 2015-04-25 ENCOUNTER — Encounter: Payer: Self-pay | Admitting: Pain Medicine

## 2015-04-25 ENCOUNTER — Ambulatory Visit: Payer: Medicare Other | Attending: Pain Medicine | Admitting: Pain Medicine

## 2015-04-25 VITALS — BP 130/84 | HR 76 | Temp 98.2°F | Resp 16 | Ht 60.0 in | Wt 125.0 lb

## 2015-04-25 DIAGNOSIS — Z87891 Personal history of nicotine dependence: Secondary | ICD-10-CM | POA: Insufficient documentation

## 2015-04-25 DIAGNOSIS — M542 Cervicalgia: Secondary | ICD-10-CM

## 2015-04-25 DIAGNOSIS — R208 Other disturbances of skin sensation: Secondary | ICD-10-CM

## 2015-04-25 DIAGNOSIS — M4802 Spinal stenosis, cervical region: Secondary | ICD-10-CM | POA: Diagnosis not present

## 2015-04-25 DIAGNOSIS — G608 Other hereditary and idiopathic neuropathies: Secondary | ICD-10-CM | POA: Diagnosis not present

## 2015-04-25 DIAGNOSIS — R7303 Prediabetes: Secondary | ICD-10-CM | POA: Diagnosis not present

## 2015-04-25 DIAGNOSIS — K589 Irritable bowel syndrome without diarrhea: Secondary | ICD-10-CM | POA: Diagnosis not present

## 2015-04-25 DIAGNOSIS — I10 Essential (primary) hypertension: Secondary | ICD-10-CM | POA: Insufficient documentation

## 2015-04-25 DIAGNOSIS — F39 Unspecified mood [affective] disorder: Secondary | ICD-10-CM | POA: Diagnosis not present

## 2015-04-25 DIAGNOSIS — M4806 Spinal stenosis, lumbar region: Secondary | ICD-10-CM | POA: Diagnosis not present

## 2015-04-25 DIAGNOSIS — E039 Hypothyroidism, unspecified: Secondary | ICD-10-CM | POA: Insufficient documentation

## 2015-04-25 DIAGNOSIS — M5416 Radiculopathy, lumbar region: Secondary | ICD-10-CM | POA: Insufficient documentation

## 2015-04-25 DIAGNOSIS — R937 Abnormal findings on diagnostic imaging of other parts of musculoskeletal system: Secondary | ICD-10-CM | POA: Diagnosis not present

## 2015-04-25 DIAGNOSIS — M47812 Spondylosis without myelopathy or radiculopathy, cervical region: Secondary | ICD-10-CM

## 2015-04-25 DIAGNOSIS — F418 Other specified anxiety disorders: Secondary | ICD-10-CM | POA: Diagnosis not present

## 2015-04-25 DIAGNOSIS — M5412 Radiculopathy, cervical region: Secondary | ICD-10-CM

## 2015-04-25 DIAGNOSIS — K219 Gastro-esophageal reflux disease without esophagitis: Secondary | ICD-10-CM | POA: Insufficient documentation

## 2015-04-25 DIAGNOSIS — F331 Major depressive disorder, recurrent, moderate: Secondary | ICD-10-CM | POA: Insufficient documentation

## 2015-04-25 DIAGNOSIS — K449 Diaphragmatic hernia without obstruction or gangrene: Secondary | ICD-10-CM | POA: Insufficient documentation

## 2015-04-25 DIAGNOSIS — M792 Neuralgia and neuritis, unspecified: Secondary | ICD-10-CM

## 2015-04-25 DIAGNOSIS — Z7189 Other specified counseling: Secondary | ICD-10-CM

## 2015-04-25 DIAGNOSIS — G8929 Other chronic pain: Secondary | ICD-10-CM

## 2015-04-25 DIAGNOSIS — M549 Dorsalgia, unspecified: Secondary | ICD-10-CM | POA: Diagnosis present

## 2015-04-25 MED ORDER — GABAPENTIN 100 MG PO CAPS: 100.0000 mg | ORAL_CAPSULE | Freq: Two times a day (BID) | ORAL | Status: DC

## 2015-04-25 MED ORDER — GABAPENTIN 300 MG PO CAPS: 600.0000 mg | ORAL_CAPSULE | Freq: Three times a day (TID) | ORAL | Status: DC
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
## 2015-04-25 NOTE — Progress Notes (Signed)
Safety precautions to be maintained throughout the outpatient stay will include: orient to surroundings, keep bed in low position, maintain call bell within reach at all times, provide assistance with transfer out of bed and ambulation.  
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
## 2015-05-01 ENCOUNTER — Encounter: Payer: Self-pay | Admitting: Family Medicine

## 2015-05-01 ENCOUNTER — Ambulatory Visit (INDEPENDENT_AMBULATORY_CARE_PROVIDER_SITE_OTHER): Payer: Medicare Other | Admitting: Family Medicine

## 2015-05-01 VITALS — BP 136/88 | HR 84 | Temp 98.2°F | Wt 126.2 lb

## 2015-05-01 DIAGNOSIS — M47812 Spondylosis without myelopathy or radiculopathy, cervical region: Secondary | ICD-10-CM | POA: Diagnosis not present

## 2015-05-01 DIAGNOSIS — E039 Hypothyroidism, unspecified: Secondary | ICD-10-CM | POA: Diagnosis not present

## 2015-05-01 DIAGNOSIS — F411 Generalized anxiety disorder: Secondary | ICD-10-CM

## 2015-05-01 DIAGNOSIS — F418 Other specified anxiety disorders: Secondary | ICD-10-CM

## 2015-05-01 MED ORDER — AMLODIPINE BESYLATE 2.5 MG PO TABS
2.5000 mg | ORAL_TABLET | Freq: Every day | ORAL | Status: DC
Start: 2015-05-01 — End: 2016-05-08

## 2015-05-01 MED ORDER — BUPROPION HCL 100 MG PO TABS: 100.0000 mg | ORAL_TABLET | Freq: Three times a day (TID) | ORAL | Status: DC

## 2015-05-01 NOTE — Patient Instructions (Addendum)
If you don't hear from Dr Adalberto Cole office about counseling let me know and we can get you set up at our office. I will give you our counselor's cards today. Hang in there! I agree with seeing neurosurgeon. Let us know if you don't hear from referral.  Let's increase wellbutrin to 100mg  three times daily Return to see me in 2-3 months for medicare wellness visit, sooner if needed

## 2015-05-01 NOTE — Progress Notes (Signed)
Pre visit review using our clinic review tool, if applicable. No additional management support is needed unless otherwise documented below in the visit note. 

## 2015-05-01 NOTE — Assessment & Plan Note (Signed)
Depression = anxiety.  Discussed current family stressors - support provided. Will trial higher wellbutrin dose.

## 2015-05-01 NOTE — Assessment & Plan Note (Signed)
Pending referral to neurosurgery.

## 2015-05-01 NOTE — Assessment & Plan Note (Signed)
Depression = anxiety. Increase wellbutrin to 150mg  BID. Pt states prior increased dose helped sxs. Advised monitor for worsening anxiety with higher wellbutirn dose.
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
## 2015-05-01 NOTE — Assessment & Plan Note (Signed)
Chronic, stable. TSH stable on current regimen - continue

## 2015-05-04 ENCOUNTER — Telehealth: Payer: Self-pay

## 2015-05-04 NOTE — Telephone Encounter (Signed)
Pt left v/m that she was to call if not heard from neuro referral. Pt request cb from [facility] prior than trying to schedule referral. Rosaria Ferries said there are 2 referrals in system that are being worked on now. Rosaria Ferries said there is not an order in pts chart for her to do referral.

## 2015-05-07 NOTE — Telephone Encounter (Signed)
Let's have her call Dr Adalberto Cole office to get an update on referral placed 04/25/2015.

## 2015-05-07 NOTE — Telephone Encounter (Signed)
Called Dr Adalberto Cole office to ask about the Referral, they said they would call the patient about it. Called the patient and [facility] and told her to call them to ask them about her Referral.

## 2015-05-10 DIAGNOSIS — M4806 Spinal stenosis, lumbar region: Secondary | ICD-10-CM | POA: Diagnosis not present

## 2015-05-10 DIAGNOSIS — M4310 Spondylolisthesis, site unspecified: Secondary | ICD-10-CM | POA: Diagnosis not present

## 2015-05-10 DIAGNOSIS — M4802 Spinal stenosis, cervical region: Secondary | ICD-10-CM | POA: Diagnosis not present

## 2015-05-10 DIAGNOSIS — G5603 Carpal tunnel syndrome, bilateral upper limbs: Secondary | ICD-10-CM | POA: Diagnosis not present

## 2015-05-11 ENCOUNTER — Other Ambulatory Visit (HOSPITAL_COMMUNITY): Payer: Self-pay | Admitting: Neurosurgery

## 2015-05-16 ENCOUNTER — Telehealth: Payer: Self-pay

## 2015-05-16 NOTE — Telephone Encounter (Signed)
Received fax from [facility] for cardiac clearance. Pt last OV w/Arida in 2014 We have made appt for pt w/Ryan Dunn Feb 15, 8am Left message on pt home and cell VM to call our office.

## 2015-05-17 NOTE — Telephone Encounter (Signed)
Pt returned call. Informed pt of appt time w/Ryan in order to have clearance for surgery. Pt states 8am is too early because she will have difficulty getting a ride. Asks I call her back as she is cooking breakfast now.

## 2015-05-17 NOTE — Telephone Encounter (Signed)
Pt called back, spoke to scheduling and changed appt to Feb 17

## 2015-05-22 ENCOUNTER — Encounter (HOSPITAL_COMMUNITY)
Admission: RE | Admit: 2015-05-22 | Discharge: 2015-05-22 | Disposition: A | Discharge status: Home / Self Care | Payer: Medicare Other | Source: Ambulatory Visit | Attending: Neurosurgery | Admitting: Neurosurgery

## 2015-05-22 ENCOUNTER — Encounter (HOSPITAL_COMMUNITY): Payer: Self-pay

## 2015-05-22 DIAGNOSIS — Z01812 Encounter for preprocedural laboratory examination: Secondary | ICD-10-CM | POA: Diagnosis not present

## 2015-05-22 DIAGNOSIS — E039 Hypothyroidism, unspecified: Secondary | ICD-10-CM | POA: Insufficient documentation

## 2015-05-22 DIAGNOSIS — Z7982 Long term (current) use of aspirin: Secondary | ICD-10-CM | POA: Diagnosis not present

## 2015-05-22 DIAGNOSIS — Z79899 Other long term (current) drug therapy: Secondary | ICD-10-CM | POA: Diagnosis not present

## 2015-05-22 DIAGNOSIS — I10 Essential (primary) hypertension: Secondary | ICD-10-CM | POA: Insufficient documentation

## 2015-05-22 DIAGNOSIS — Z87891 Personal history of nicotine dependence: Secondary | ICD-10-CM | POA: Insufficient documentation

## 2015-05-22 DIAGNOSIS — Z01818 Encounter for other preprocedural examination: Secondary | ICD-10-CM | POA: Diagnosis not present

## 2015-05-22 HISTORY — DX: Personal history of colonic polyps: Z86.010

## 2015-05-22 HISTORY — DX: Personal history of peptic ulcer disease: Z87.11

## 2015-05-22 HISTORY — DX: Dorsalgia, unspecified: M54.9

## 2015-05-22 HISTORY — DX: Major depressive disorder, single episode, unspecified: F32.9

## 2015-05-22 HISTORY — DX: Unspecified cataract: H26.9

## 2015-05-22 HISTORY — DX: Frequency of micturition: R35.0

## 2015-05-22 HISTORY — DX: Personal history of colon polyps, unspecified: Z86.0100

## 2015-05-22 HISTORY — DX: Nocturia: R35.1

## 2015-05-22 HISTORY — DX: Other chronic pain: G89.29

## 2015-05-22 HISTORY — DX: Urgency of urination: R39.15

## 2015-05-22 HISTORY — DX: Cardiac murmur, unspecified: R01.1

## 2015-05-22 HISTORY — DX: Pain in unspecified joint: M25.50

## 2015-05-22 HISTORY — DX: Personal history of other diseases of the digestive system: Z87.19

## 2015-05-22 HISTORY — DX: Headache, unspecified: R51.9

## 2015-05-22 HISTORY — DX: Headache: R51

## 2015-05-22 HISTORY — DX: Depression, unspecified: F32.A

## 2015-05-22 HISTORY — DX: Insomnia, unspecified: G47.00

## 2015-05-22 HISTORY — DX: Effusion, unspecified joint: M25.40

## 2015-05-22 HISTORY — DX: Dizziness and giddiness: R42

## 2015-05-22 HISTORY — DX: Diverticulosis of intestine, part unspecified, without perforation or abscess without bleeding: K57.90

## 2015-05-22 HISTORY — DX: Personal history of other diseases of the respiratory system: Z87.09

## 2015-05-22 LAB — CBC WITH DIFFERENTIAL/PLATELET
Basophils Absolute: 0.1 10*3/uL (ref 0.0–0.1)
Basophils Relative: 1 %
Eosinophils Absolute: 0.1 10*3/uL (ref 0.0–0.7)
Eosinophils Relative: 3 %
HEMATOCRIT: 40.6 % (ref 36.0–46.0)
Hemoglobin: 13.8 g/dL (ref 12.0–15.0)
LYMPHS ABS: 1.4 10*3/uL (ref 0.7–4.0)
LYMPHS PCT: 33 %
MCH: 30.8 pg (ref 26.0–34.0)
MCHC: 34 g/dL (ref 30.0–36.0)
MCV: 90.6 fL (ref 78.0–100.0)
MONO ABS: 0.4 10*3/uL (ref 0.1–1.0)
MONOS PCT: 9 %
NEUTROS ABS: 2.3 10*3/uL (ref 1.7–7.7)
Neutrophils Relative %: 54 %
Platelets: 286 10*3/uL (ref 150–400)
RBC: 4.48 MIL/uL (ref 3.87–5.11)
RDW: 13.6 % (ref 11.5–15.5)
WBC: 4.2 10*3/uL (ref 4.0–10.5)

## 2015-05-22 LAB — BASIC METABOLIC PANEL
ANION GAP: 10 (ref 5–15)
BUN: 6 mg/dL (ref 6–20)
CHLORIDE: 98 mmol/L — AB (ref 101–111)
CO2: 28 mmol/L (ref 22–32)
Calcium: 9.6 mg/dL (ref 8.9–10.3)
Creatinine, Ser: 0.65 mg/dL (ref 0.44–1.00)
GFR calc Af Amer: >60 mL/min (ref >60)
GFR calc non Af Amer: >60 mL/min (ref >60)
GLUCOSE: 98 mg/dL (ref 65–99)
POTASSIUM: 3.8 mmol/L (ref 3.5–5.1)
Sodium: 136 mmol/L (ref 135–145)

## 2015-05-22 LAB — SURGICAL PCR SCREEN
MRSA, PCR: NEGATIVE
STAPHYLOCOCCUS AUREUS: NEGATIVE

## 2015-05-22 NOTE — Progress Notes (Signed)
Cardiologist is Dr.Arida=appointment on 05/25/15 for clearance  Medical MD is Dr.Javier Marin Olp  Echo reports in epic from 2009/2013  Stress test in epic from 2013  Heart cath done in 2000  EKG and CXR in epic from 04-12-15

## 2015-05-22 NOTE — Pre-Procedure Instructions (Signed)
Cheyenne Pierce  05/22/2015      [id number] [place], [name] - [address] [name] [address] Phone: [phone] Fax: [phone]  [facility] [phone] [place], [place] [address] Phone: [phone] Fax: [phone]    Your procedure is scheduled on Tues, Feb 28 @ 10:55 AM  Report to [facility] Admitting at 8:45 AM  Call this number if you have problems the morning of surgery:  [phone]   Remember:  Do not eat food or drink liquids after midnight.  Take these medicines the morning of surgery with A SIP OF WATER Amlodipine(Norvasc),Tessalon(Benzonatate),Wellbutrin(Bupropion),Flonase(Fluticasone),Gabapentin(Neurontin),Synthroid(Levothyroxine),Antivert(Meclizine-if needed), and Pantoprazole(Protonix)               Stop taking your Aspirin along with any Vitamins or Herbal Medications. No Goody's,BC's,Aleve,Ibuprofen,Motrin, or Fish Oil   Do not wear jewelry, make-up or nail polish.  Do not wear lotions, powders, or perfumes.  You may wear deodorant.  Do not shave 48 hours prior to surgery.    Do not bring valuables to the hospital.  [facility] is not responsible for any belongings or valuables.  Contacts, dentures or bridgework may not be worn into surgery.  Leave your suitcase in the car.  After surgery it may be brought to your room.  For patients admitted to the hospital, discharge time will be determined by your treatment team.  Patients discharged the day of surgery will not be allowed to drive home.    Special instructions:  [place] - Preparing for Surgery  Before surgery, you can play an important role.  Because skin is not sterile, your skin needs to be as free of germs as possible.  You can reduce the number of germs on you skin by washing with CHG (chlorahexidine gluconate) soap before surgery.  CHG is an antiseptic cleaner which kills  germs and bonds with the skin to continue killing germs even after washing.  Please DO NOT use if you have an allergy to CHG or antibacterial soaps.  If your skin becomes reddened/irritated stop using the CHG and inform your nurse when you arrive at Short Stay.  Do not shave (including legs and underarms) for at least 48 hours prior to the first CHG shower.  You may shave your face.  Please follow these instructions carefully:   1.  Shower with CHG Soap the night before surgery and the                                morning of Surgery.  2.  If you choose to wash your hair, wash your hair first as usual with your       normal shampoo.  3.  After you shampoo, rinse your hair and body thoroughly to remove the                      Shampoo.  4.  Use CHG as you would any other liquid soap.  You can apply chg directly       to the skin and wash gently with scrungie or a clean washcloth.  5.  Apply the CHG Soap to your body ONLY FROM THE NECK DOWN.        Do not use on open wounds or open sores.  Avoid contact with your eyes,  ears, mouth and genitals (private parts).  Wash genitals (private parts)       with your normal soap.  6.  Wash thoroughly, paying special attention to the area where your surgery        will be performed.  7.  Thoroughly rinse your body with warm water from the neck down.  8.  DO NOT shower/wash with your normal soap after using and rinsing off       the CHG Soap.  9.  Pat yourself dry with a clean towel.            10.  Wear clean pajamas.            11.  Place clean sheets on your bed the night of your first shower and do not        sleep with pets.  Day of Surgery  Do not apply any lotions/deoderants the morning of surgery.  Please wear clean clothes to the hospital/surgery center.    Please read over the following fact sheets that you were given. Pain Booklet, Coughing and Deep Breathing, MRSA Information and Surgical Site Infection Prevention

## 2015-05-23 ENCOUNTER — Ambulatory Visit: Payer: Medicare Other | Admitting: Physician Assistant

## 2015-05-23 NOTE — Progress Notes (Addendum)
Anesthesia Chart Review:  Pt is a Cheyenne Pierce scheduled for C3-4, C4-5, C5-6, C6-7 ACDF on 06/05/2015 with Dr. Annette Stable.   PMH includes:  HTN, angina, heart murmur, hypothyroidism. Former smoker. BMI 25.   Medications include: amlodipine, ASA, levothyroidism, protonix, potassium, triamterene-hctz.   Preoperative labs reviewed.    Chest x-ray 04/12/15 reviewed. No acute cardiopulmonary findings.   EKG 04/12/15: NSR. LAD. Minimal voltage criteria for LVH, may be normal variant. Anterior infarct, age undetermined  Echo 11/24/11:  - Left ventricle: The cavity size was normal. There was mild focal basal hypertrophy of the septum. Systolic function was normal. The estimated ejection fraction was in therange of 50% to 55%. Wall motion was normal; there were no regional wall motion abnormalities. Doppler parameters are consistent with abnormal left ventricular relaxation (grade 1 diastolic dysfunction). - Aortic valve: Mild regurgitation. - Aortic root: The aortic root was mildly dilated. - Ascending aorta: The ascending aorta was mildly dilated. - Mitral valve: Mild regurgitation. - Pulmonary arteries: Systolic pressure was within the normal range.  Nuclear stress test 11/21/11:  1. Normal study. No evidence of ischemia or infarct 2. Normal LV systolic function with calculated EF 73%  Pt has appt 05/25/15 with cardiology for clearance for surgery. Will revisit chart after that visit.   Willeen Cass, FNP-BC [facility] Stay Surgical Center/Anesthesiology Phone: [phone] 05/23/2015 11:51 AM  Addendum:   Pt saw Christell Faith, PA with cardiology on 05/25/15 and was cleared for surgery at low risk.   If no changes, I anticipate pt can proceed with surgery as scheduled.   Willeen Cass, FNP-BC [facility] Short Stay Surgical Center/Anesthesiology Phone: [phone] 05/28/2015 12:12 PM

## 2015-05-24 ENCOUNTER — Telehealth: Payer: Self-pay

## 2015-05-24 NOTE — Telephone Encounter (Signed)
Cardiac clearance form given to Christell Faith, PA-C

## 2015-05-25 ENCOUNTER — Encounter: Payer: Self-pay | Admitting: Physician Assistant

## 2015-05-25 ENCOUNTER — Ambulatory Visit (INDEPENDENT_AMBULATORY_CARE_PROVIDER_SITE_OTHER): Payer: Medicare Other | Admitting: Physician Assistant

## 2015-05-25 VITALS — BP 140/72 | HR 75 | Ht 60.0 in | Wt 125.0 lb

## 2015-05-25 DIAGNOSIS — I10 Essential (primary) hypertension: Secondary | ICD-10-CM

## 2015-05-25 DIAGNOSIS — I340 Nonrheumatic mitral (valve) insufficiency: Secondary | ICD-10-CM | POA: Diagnosis not present

## 2015-05-25 DIAGNOSIS — Z01810 Encounter for preprocedural cardiovascular examination: Secondary | ICD-10-CM

## 2015-05-25 DIAGNOSIS — I493 Ventricular premature depolarization: Secondary | ICD-10-CM | POA: Diagnosis not present

## 2015-05-25 DIAGNOSIS — F419 Anxiety disorder, unspecified: Secondary | ICD-10-CM

## 2015-05-25 DIAGNOSIS — R079 Chest pain, unspecified: Secondary | ICD-10-CM

## 2015-05-25 NOTE — Patient Instructions (Addendum)
Medication Instructions:  Your physician recommends that you continue on your current medications as directed. Please refer to the Current Medication list given to you today.  Labwork: None ordered  Testing/Procedures: [place]  Your caregiver has ordered a Stress Test with nuclear imaging. The purpose of this test is to evaluate the blood supply to your heart muscle. This procedure is referred to as a "Non-Invasive Stress Test." This is because other than having an IV started in your vein, nothing is inserted or "invades" your body. Cardiac stress tests are done to find areas of poor blood flow to the heart by determining the extent of coronary artery disease (CAD). Some patients exercise on a treadmill, which naturally increases the blood flow to your heart, while others who are  unable to walk on a treadmill due to physical limitations have a pharmacologic/chemical stress agent called Lexiscan . This medicine will mimic walking on a treadmill by temporarily increasing your coronary blood flow.   Please note: these test may take anywhere between 2-4 hours to complete  PLEASE REPORT TO [facility] AT THE FIRST DESK WILL DIRECT YOU WHERE TO GO  Date of Procedure:_______Tuesday, Feb 21_________  Arrival Time for Procedure:____7:15 am____________  How to prepare for your Myoview test:   Do not eat or drink after midnight  No caffeine for 24 hours prior to test  No smoking 24 hours prior to test.  Your medication may be taken with water.  If your doctor stopped a medication because of this test, do not take that medication.  Ladies, please do not wear dresses.  Skirts or pants are appropriate. Please wear a short sleeve shirt.  No perfume, cologne or lotion.  Your physician has requested that you have an echocardiogram. Echocardiography is a painless test that uses sound waves to create images of your heart. It provides your doctor with information  about the size and shape of your heart and how well your heart's chambers and valves are working. This procedure takes approximately one hour. There are no restrictions for this procedure.  Date & time: _______________________  Follow-Up: Your physician recommends that you schedule a follow-up appointment in: 3 months with Dr. Rockey Situ  Date & Time _________________________________  If you need a refill on your cardiac medications before your next appointment, please call your pharmacy. Cardiac Nuclear Scanning A cardiac nuclear scan is used to check your heart for problems, such as the following:  A portion of the heart is not getting enough blood.  Part of the heart muscle has died, which happens with a heart attack.  The heart wall is not working normally.  In this test, a radioactive dye (tracer) is injected into your bloodstream. After the tracer has traveled to your heart, a scanning device is used to measure how much of the tracer is absorbed by or distributed to various areas of your heart. LET [facility] CARE PROVIDER KNOW ABOUT:  Any allergies you have.  All medicines you are taking, including vitamins, herbs, eye drops, creams, and over-the-counter medicines.  Previous problems you or members of your family have had with the use of anesthetics.  Any blood disorders you have.  Previous surgeries you have had.  Medical conditions you have.  RISKS AND COMPLICATIONS Generally, this is a safe procedure. However, as with any procedure, problems can occur. Possible problems include:   Serious chest pain.  Rapid heartbeat.  Sensation of warmth in your chest. This usually passes quickly.  BEFORE THE PROCEDURE Ask your health care provider about changing or stopping your regular medicines. PROCEDURE This procedure is usually done at a hospital and takes 2-4 hours.  An IV tube is inserted into one of your veins.  Your health care provider will inject a small amount of  radioactive tracer through the tube.  You will then wait for 20-40 minutes while the tracer travels through your bloodstream.  You will lie down on an exam table so images of your heart can be taken. Images will be taken for about 15-20 minutes.  You will exercise on a treadmill or stationary bike. While you exercise, your heart activity will be monitored with an electrocardiogram (ECG), and your blood pressure will be checked.  If you are unable to exercise, you may be given a medicine to make your heart beat faster.  When blood flow to your heart has peaked, tracer will again be injected through the IV tube.  After 20-40 minutes, you will get back on the exam table and have more images taken of your heart.  When the procedure is over, your IV tube will be removed. AFTER THE PROCEDURE  You will likely be able to leave shortly after the test. Unless your health care provider tells you otherwise, you may return to your normal schedule, including diet, activities, and medicines.  Make sure you find out how and when you will get your test results.   This information is not intended to replace advice given to you by your health care provider. Make sure you discuss any questions you have with your health care provider.   Document Released: 04/18/2004 Document Revised: 03/29/2013 Document Reviewed: 03/02/2013 Elsevier Interactive Patient Education [employer]. Echocardiogram An echocardiogram, or echocardiography, uses sound waves (ultrasound) to produce an image of your heart. The echocardiogram is simple, painless, obtained within a short period of time, and offers valuable information to your health care provider. The images from an echocardiogram can provide information such as:  Evidence of coronary artery disease (CAD).  Heart size.  Heart muscle function.  Heart valve function.  Aneurysm detection.  Evidence of a past heart attack.  Fluid buildup around the  heart.  Heart muscle thickening.  Assess heart valve function. LET [facility] CARE PROVIDER KNOW ABOUT:  Any allergies you have.  All medicines you are taking, including vitamins, herbs, eye drops, creams, and over-the-counter medicines.  Previous problems you or members of your family have had with the use of anesthetics.  Any blood disorders you have.  Previous surgeries you have had.  Medical conditions you have.  Possibility of pregnancy, if this applies. BEFORE THE PROCEDURE  No special preparation is needed. Eat and drink normally.  PROCEDURE   In order to produce an image of your heart, gel will be applied to your chest and a wand-like tool (transducer) will be moved over your chest. The gel will help transmit the sound waves from the transducer. The sound waves will harmlessly bounce off your heart to allow the heart images to be captured in real-time motion. These images will then be recorded.  You may need an IV to receive a medicine that improves the quality of the pictures. AFTER THE PROCEDURE You may return to your normal schedule including diet, activities, and medicines, unless your health care provider tells you otherwise.   This information is not intended to replace advice given to you by your health care provider. Make sure you discuss any questions you have with  your health care provider.   Document Released: 03/21/2000 Document Revised: 04/14/2014 Document Reviewed: 11/29/2012 Elsevier Interactive Patient Education [employer].

## 2015-05-25 NOTE — Progress Notes (Signed)
Cardiology Office Note Date:  05/25/2015  Patient ID:  Cheyenne, Pierce Jun 01, 1934, MRN [id number] PCP:  Ria Bush, MD  Cardiologist:  Dr. Fletcher Anon, MD    Chief Complaint: Pre-operative evaluation  History of Present Illness: Cheyenne Pierce is a 81 y.o. female with history of prior nonobstructive cardiac cath, recurrent chest pain throughout her life, multiple medication intolerances, severe GERD, hypothyroidism, prior tobacco abuse, and anxiety/depression who presents for pre-operative evaluation of cervical spine fusion on 06/05/2015. She had a cardiac evaluation in 2009 by Dr. Harrington Challenger in which she underwent an echo and an adenosine Myoview in which both were unremarkable. She most recently has undergone a Lexiscan in 11/2011 that showed no evidence of ischemia with a normal EF. She also had an echo in 11/2011 that showed an EF of 50-55%, no RWMA, GR1DD, mild AI, aortic root was mildly dilated, mild MR, PASP was normal. She continued to have exertional chest pain and palpitations, thus metoprolol 25 mg bid was added. This apparently led to nightmares and was discontinued. It was changed to Bystolic which had a similar effect. Since then she has been off a beta blocker.   She notes intermittent chest pain when she gets upset. No exertional symptoms, though her severe back and neck pain limit her from exerting herself and this is difficult to assess at this time. Her symptoms last for several minutes before self resolving without intervention. Her functional capacity is currently <4 METS, mostly limited by her neck and back pain. There are no associated symptoms such as SOB, diaphoresis, nausea, vomiting, presyncope, or syncope. She sleeps on two pillows 2/2 her neck and has done so for many years. She denies any lower extremity swelling or early satiety. She reports well controlled blood pressure at home and states her BP is always higher at the MD office.     Past Medical History  Diagnosis  Date  . Allergy   . Peripheral neuropathy (HCC)     in feet, ESI have helped [name])  . DDD (degenerative disc disease), cervical     moderate to severe multilevel cervical spine DDD, most severe central stenosis C5/6, significant R foraminal stenosis ([name])  . Angina pectoris ([place])   . Mass of vagina     referral to [facility] 03/2014  . DDD (degenerative disc disease), lumbar     multilevel degenerative changes mostly L3/4 with mod spinal canal and mild bilat neural foraminal stenosis, moderate R neural froaminal narrowing L4/5 ([name])  . DDD (degenerative disc disease), cervical     throughout spine   . Heart murmur   . History of bronchitis     last time Spring 2016  . Headache   . Vertigo     takes Antivert as needed  . Peripheral neuropathy (HCC)     takes Gabapentin daily  . Joint pain   . Joint swelling   . Chronic back pain     scoliosis/DDD  . GERD (gastroesophageal reflux disease)     takes Protonix daily  . History of hiatal hernia   . History of gastric ulcer   . IBS (irritable bowel syndrome)     diarrhea-takes Immodium as needed  . Hypertension     takes Amlodipine and Maxzide daily  . History of colon polyps     benign  . Diverticulosis   . Urinary frequency   . Urinary urgency   . Nocturia   . Hypothyroidism     takes SYnthroid daily  .  Cataract     left eye,immature  . Anxiety     takes Wellbutrin daily  . Depression   . Insomnia     takes Benadryl nightly    Past Surgical History  Procedure Laterality Date  . Appendectomy  1948  . Abdominal hysterectomy  1968    left ovary remains  . Ovarian cyst surgery  1963    cyst on ovaries  . Hernia repair    . Colostomy w/ rectocele repair  06/2004  . Ventral hernia repair  08/2008    abdominal wall, lysis of adhesions  . Colonoscopy  07/2009    [facility]), normal per pt  . Epidural steroid injections  multiple latest 05/2014, 07/2014    help periph neuropathy [name])  . US  echocardiography  2009    mild aortic/mitral insuff, ER [id number], mild diastolic dysfunction  . Nm myoview ltd  2009    WNL per report  . Dexa  2013    T -1.0  . Colonoscopy  06/2014    int hem, rpt 5 yrs ([name])  . Tonsillectomy    . Cardiac catheterization  09/26/1998  . Bladder suspension with vaginal sling    . Epidural infections    . Esophagogastroduodenoscopy      Current Outpatient Prescriptions  Medication Sig Dispense Refill  . acetaminophen (TYLENOL) 500 MG tablet Take 500 mg by mouth every 6 (six) hours as needed.    Marland Kitchen amLODipine (NORVASC) 2.5 MG tablet Take 1 tablet (2.5 mg total) by mouth daily. 30 tablet 11  . aspirin 81 MG tablet Take 81 mg by mouth. Twice a week    . benzonatate (TESSALON) 100 MG capsule take 1 capsule by mouth twice a day if needed for cough 30 capsule 0  . Biotin 1000 MCG tablet Take 1,000 mcg by mouth daily.    Marland Kitchen buPROPion (WELLBUTRIN) 100 MG tablet Take 1 tablet (100 mg total) by mouth 3 (three) times daily. 90 tablet 11  . diphenhydrAMINE (BENADRYL) 25 mg capsule Take 25 mg by mouth at bedtime.     . fluticasone (FLONASE) 50 MCG/ACT nasal spray Place 2 sprays into the nose as needed.    . gabapentin (NEURONTIN) 100 MG capsule Take 1 capsule (100 mg total) by mouth 2 (two) times daily. (Patient taking differently: Take 200 mg by mouth at bedtime. ) 60 capsule 5  . gabapentin (NEURONTIN) 300 MG capsule Take 2 capsules (600 mg total) by mouth 3 (three) times daily. 180 capsule 5  . hyoscyamine (LEVSIN, ANASPAZ) 0.125 MG tablet take 1 tablet by mouth AS NEEDED 30 tablet 3  . Lactobacillus Rhamnosus, GG, (CULTURELLE PO) Take 1 capsule by mouth daily.    Marland Kitchen levothyroxine (SYNTHROID, LEVOTHROID) 50 MCG tablet Take 1 tablet (50 mcg total) by mouth daily. Take extra tablet on Mon/Friday 110 tablet 3  . loperamide (IMODIUM) 2 MG capsule Take 2 mg by mouth as needed for diarrhea or loose stools.    . Magnesium 250 MG TABS Take by mouth daily.    . meclizine  (ANTIVERT) 25 MG tablet take 1 tablet by mouth three times a day if needed 30 tablet 1  . Multiple Vitamins-Minerals (CENTRUM SILVER PO) Take by mouth.      . nitroGLYCERIN (NITROSTAT) 0.4 MG SL tablet Place 0.4 mg under the tongue every 5 (five) minutes as needed.    . pantoprazole (PROTONIX) 40 MG tablet Take 1 tablet (40 mg total) by mouth daily. 90 tablet 3  .  Potassium 99 MG TABS Take 2 tablets by mouth daily.    . Tragacanth (ASTRAGALUS ROOT) POWD Take by mouth daily.      Marland Kitchen triamterene-hydrochlorothiazide (MAXZIDE-25) 37.5-25 MG tablet take 1 tablet by mouth once daily 30 tablet 11   No current facility-administered medications for this visit.    Allergies:   Codeine; Penicillins; Prednisone; Zoloft; Amoxicillin; Codeine phosphate; Metronidazole; Oxcarbazepine; Perindopril erbumine; Potassium-containing compounds; Prozac; and Tramadol hcl   Social History:  The patient  reports that she has quit smoking. She has never used smokeless tobacco. She reports that she does not drink alcohol or use illicit drugs.   Family History:  The patient's family history includes Heart disease in her mother; Stroke in her brother. There is no history of Hypertension, Diabetes, or Cancer.  ROS:   Review of Systems  Constitutional: Positive for malaise/fatigue. Negative for fever, chills, weight loss and diaphoresis.  HENT: Negative for congestion.   Eyes: Negative for discharge and redness.  Respiratory: Negative for cough, hemoptysis, sputum production, shortness of breath and wheezing.   Cardiovascular: Positive for chest pain and palpitations. Negative for orthopnea, claudication, leg swelling and PND.  Gastrointestinal: Negative for heartburn, nausea, vomiting, abdominal pain, diarrhea, constipation, blood in stool and melena.  Musculoskeletal: Positive for myalgias, back pain, joint pain and neck pain. Negative for falls.  Skin: Negative for rash.  Neurological: Positive for weakness. Negative  for dizziness, tingling, tremors, sensory change, speech change, focal weakness, seizures and loss of consciousness.  Endo/Heme/Allergies: Does not bruise/bleed easily.  Psychiatric/Behavioral: The patient is not nervous/anxious.   All other systems reviewed and are negative.     PHYSICAL EXAM:  VS:  BP 140/72 mmHg  Pulse 75  Ht 5' (1.524 m)  Wt 125 lb (56.7 kg)  BMI 24.41 kg/m2 BMI: Body mass index is 24.41 kg/(m^2). Well nourished, well developed, in no acute distress, sitting in a wheelchair though not wheelchair bound  HEENT: normocephalic, atraumatic Neck: no JVD, carotid bruits or masses Cardiac:  normal S1, S2; RRR; II/VI systolic murmur, no rubs, or gallops Lungs:  clear to auscultation bilaterally, no wheezing, rhonchi or rales Abd: soft, nontender, no hepatomegaly, + BS MS: no deformity or atrophy Ext: no edema Skin: warm and dry, no rash Neuro:  moves all extremities spontaneously, no focal abnormalities noted, follows commands Psych: euthymic mood, full affect   EKG:  Was ordered today. Shows NSR, 75 bpm, left axis deviation, poor R wave progression, mild LVH, nonspecific inferolateral st/t changes   Recent Labs: 01/30/2015: TSH 4.36 02/07/2015: ALT 21 05/22/2015: BUN 6; Creatinine, Ser 0.65; Hemoglobin 13.8; Platelets 286; Potassium 3.8; Sodium 136  10/06/2014: Cholesterol 196; HDL 60.40; LDL Cholesterol 111*; Total CHOL/HDL Ratio 3; Triglycerides 121.0; VLDL 24.2   Estimated Creatinine Clearance: 45.7 mL/min (by C-G formula based on Cr of 0.65).   Wt Readings from Last 3 Encounters:  05/25/15 125 lb (56.7 kg)  05/22/15 127 lb 3.2 oz (57.698 kg)  05/01/15 126 lb 4 oz (57.267 kg)     Other studies reviewed: Additional studies/records reviewed today include: summarized above  ASSESSMENT AND PLAN:  1. Pre-operative evaluation: She notes intermittent brief episodes of chest pain that are atypical and associated with her getting upset and anxious. No exertional  component. However, it is currently difficult to assess her functional capacity as this is limited by her lumber and cervical spine pain. Will proceed with Lexiscan Myoview to evaluate for high risk ischemia. If this is normal she may proceed with non-cardiac  surgery at low risk per modified Lee criteria.   2. Valvular heart disease: She has known history of mild mitral regurgitation and aortic insufficiency by last echo in 11/2011. I will check a repeat echo to trend these and assess velocity across the valves. This will not preclude her surgery as she is asymptomatic.   3. Planned cervical spine fusion: Per neurosurgery.    4. HTN: She reports a well controlled blood pressure at home and declines titration of her antihypertensives today. Continue current therapy.   5. Anxiety/depression: Continue current treatment per PCP.   Disposition: F/u with Dr. Fletcher Anon, MD in 3 months  Current medicines are reviewed at length with the patient today.  The patient did not have any concerns regarding medicines.  Melvern Banker PA-C 05/25/2015 10:43 AM     [facility] [address] [place] [address] [place], [place] [address] [phone]

## 2015-05-29 ENCOUNTER — Ambulatory Visit (INDEPENDENT_AMBULATORY_CARE_PROVIDER_SITE_OTHER): Payer: Medicare Other

## 2015-05-29 ENCOUNTER — Other Ambulatory Visit: Payer: Self-pay

## 2015-05-29 ENCOUNTER — Encounter
Admission: RE | Admit: 2015-05-29 | Discharge: 2015-05-29 | Disposition: A | Discharge status: Home / Self Care | Payer: Medicare Other | Source: Ambulatory Visit | Attending: Physician Assistant | Admitting: Physician Assistant

## 2015-05-29 DIAGNOSIS — I340 Nonrheumatic mitral (valve) insufficiency: Secondary | ICD-10-CM | POA: Insufficient documentation

## 2015-05-29 DIAGNOSIS — I493 Ventricular premature depolarization: Secondary | ICD-10-CM

## 2015-05-29 DIAGNOSIS — I10 Essential (primary) hypertension: Secondary | ICD-10-CM | POA: Insufficient documentation

## 2015-05-29 DIAGNOSIS — Z01810 Encounter for preprocedural cardiovascular examination: Secondary | ICD-10-CM | POA: Insufficient documentation

## 2015-05-29 MED ORDER — TECHNETIUM TC 99M SESTAMIBI - CARDIOLITE
10.0000 | Freq: Once | INTRAVENOUS | Status: AC | PRN
  Administered 2015-05-29: 08:00:00 12.77 via INTRAVENOUS

## 2015-05-29 MED ORDER — REGADENOSON 0.4 MG/5ML IV SOLN
0.4000 mg | Freq: Once | INTRAVENOUS | Status: AC
  Administered 2015-05-29: 0.4 mg via INTRAVENOUS

## 2015-05-29 MED ORDER — TECHNETIUM TC 99M SESTAMIBI - CARDIOLITE
32.7840 | Freq: Once | INTRAVENOUS | Status: AC | PRN
  Administered 2015-05-29: 32.784 via INTRAVENOUS

## 2015-05-30 ENCOUNTER — Other Ambulatory Visit: Payer: Self-pay

## 2015-05-30 ENCOUNTER — Telehealth: Payer: Self-pay

## 2015-05-30 DIAGNOSIS — I351 Nonrheumatic aortic (valve) insufficiency: Secondary | ICD-10-CM

## 2015-05-30 LAB — NM MYOCAR MULTI W/SPECT W/WALL MOTION / EF
CHL CUP NUCLEAR SDS: 3
CHL CUP NUCLEAR SRS: 1
CHL CUP RESTING HR STRESS: 65 {beats}/min
CHL CUP STRESS STAGE 1 HR: 67 {beats}/min
CHL CUP STRESS STAGE 2 GRADE: 0 %
CHL CUP STRESS STAGE 3 GRADE: 0 %
CHL CUP STRESS STAGE 3 HR: 92 {beats}/min
CHL CUP STRESS STAGE 3 SPEED: 0 mph
CHL CUP STRESS STAGE 4 HR: 99 {beats}/min
CHL CUP STRESS STAGE 5 GRADE: 0 %
CHL CUP STRESS STAGE 5 SPEED: 0 mph
CSEPED: 0 min
CSEPEDS: 0 s
CSEPEW: 1 METS
CSEPHR: 69 %
LV dias vol: 35 mL
LV sys vol: 5 mL
MPHR: 142 {beats}/min
Peak HR: 92 {beats}/min
Percent of predicted max HR: 64 %
SSS: 3
Stage 1 Grade: 0 %
Stage 1 Speed: 0 mph
Stage 2 HR: 66 {beats}/min
Stage 2 Speed: 0 mph
Stage 4 Grade: 0 %
Stage 4 Speed: 0 mph
Stage 5 DBP: 67 mmHg
Stage 5 HR: 85 {beats}/min
Stage 5 SBP: 132 mmHg
TID: 0.89

## 2015-05-30 NOTE — Telephone Encounter (Signed)
Reviewed echo results w/pt who verbalized understanding. Echo order for one year placed

## 2015-05-31 ENCOUNTER — Telehealth: Payer: Self-pay

## 2015-05-31 ENCOUNTER — Encounter: Payer: Self-pay | Admitting: Physician Assistant

## 2015-05-31 NOTE — Telephone Encounter (Signed)
Faxed clearance to [facility]

## 2015-05-31 NOTE — Progress Notes (Signed)
Please see letter.

## 2015-06-04 MED ORDER — VANCOMYCIN HCL IN DEXTROSE 1-5 GM/200ML-% IV SOLN
1000.0000 mg | INTRAVENOUS | Status: AC
  Administered 2015-06-05: 1000 mg via INTRAVENOUS
  Filled 2015-06-04: qty 200

## 2015-06-05 ENCOUNTER — Inpatient Hospital Stay (HOSPITAL_COMMUNITY)
Admission: RE | Admit: 2015-06-05 | Discharge: 2015-06-06 | DRG: 472 | Disposition: A | Discharge status: Home / Self Care | Payer: Medicare Other | Source: Ambulatory Visit | Attending: Neurosurgery | Admitting: Neurosurgery

## 2015-06-05 ENCOUNTER — Inpatient Hospital Stay (HOSPITAL_COMMUNITY): Payer: Medicare Other | Admitting: Emergency Medicine

## 2015-06-05 ENCOUNTER — Inpatient Hospital Stay (HOSPITAL_COMMUNITY): Payer: Medicare Other | Admitting: Anesthesiology

## 2015-06-05 ENCOUNTER — Inpatient Hospital Stay (HOSPITAL_COMMUNITY): Payer: Medicare Other

## 2015-06-05 ENCOUNTER — Encounter (HOSPITAL_COMMUNITY): Payer: Self-pay | Admitting: Anesthesiology

## 2015-06-05 ENCOUNTER — Encounter (HOSPITAL_COMMUNITY)
Admission: RE | Disposition: A | Discharge status: Home / Self Care | Payer: Self-pay | Source: Ambulatory Visit | Attending: Neurosurgery

## 2015-06-05 DIAGNOSIS — Z419 Encounter for procedure for purposes other than remedying health state, unspecified: Secondary | ICD-10-CM

## 2015-06-05 DIAGNOSIS — Z7982 Long term (current) use of aspirin: Secondary | ICD-10-CM | POA: Diagnosis not present

## 2015-06-05 DIAGNOSIS — E039 Hypothyroidism, unspecified: Secondary | ICD-10-CM | POA: Diagnosis present

## 2015-06-05 DIAGNOSIS — M47812 Spondylosis without myelopathy or radiculopathy, cervical region: Secondary | ICD-10-CM | POA: Diagnosis present

## 2015-06-05 DIAGNOSIS — M4322 Fusion of spine, cervical region: Secondary | ICD-10-CM | POA: Diagnosis not present

## 2015-06-05 DIAGNOSIS — Z888 Allergy status to other drugs, medicaments and biological substances status: Secondary | ICD-10-CM

## 2015-06-05 DIAGNOSIS — Z8711 Personal history of peptic ulcer disease: Secondary | ICD-10-CM | POA: Diagnosis not present

## 2015-06-05 DIAGNOSIS — M542 Cervicalgia: Secondary | ICD-10-CM | POA: Diagnosis present

## 2015-06-05 DIAGNOSIS — Z885 Allergy status to narcotic agent status: Secondary | ICD-10-CM

## 2015-06-05 DIAGNOSIS — K219 Gastro-esophageal reflux disease without esophagitis: Secondary | ICD-10-CM | POA: Diagnosis present

## 2015-06-05 DIAGNOSIS — G629 Polyneuropathy, unspecified: Secondary | ICD-10-CM | POA: Diagnosis not present

## 2015-06-05 DIAGNOSIS — Z87891 Personal history of nicotine dependence: Secondary | ICD-10-CM | POA: Diagnosis not present

## 2015-06-05 DIAGNOSIS — G9520 Unspecified cord compression: Secondary | ICD-10-CM | POA: Diagnosis not present

## 2015-06-05 DIAGNOSIS — Z880 Allergy status to penicillin: Secondary | ICD-10-CM

## 2015-06-05 DIAGNOSIS — K589 Irritable bowel syndrome without diarrhea: Secondary | ICD-10-CM | POA: Diagnosis present

## 2015-06-05 DIAGNOSIS — I10 Essential (primary) hypertension: Secondary | ICD-10-CM | POA: Diagnosis present

## 2015-06-05 DIAGNOSIS — Z881 Allergy status to other antibiotic agents status: Secondary | ICD-10-CM | POA: Diagnosis not present

## 2015-06-05 DIAGNOSIS — M4802 Spinal stenosis, cervical region: Secondary | ICD-10-CM | POA: Diagnosis not present

## 2015-06-05 DIAGNOSIS — M549 Dorsalgia, unspecified: Secondary | ICD-10-CM | POA: Diagnosis not present

## 2015-06-05 HISTORY — PX: ANTERIOR CERVICAL DECOMPRESSION/DISCECTOMY FUSION 4 LEVELS: SHX5556

## 2015-06-05 SURGERY — ANTERIOR CERVICAL DECOMPRESSION/DISCECTOMY FUSION 4 LEVELS
Anesthesia: General | Site: Spine Cervical

## 2015-06-05 MED ORDER — FENTANYL CITRATE (PF) 100 MCG/2ML IJ SOLN
INTRAMUSCULAR | Status: AC
  Filled 2015-06-05: qty 2

## 2015-06-05 MED ORDER — SUGAMMADEX SODIUM 200 MG/2ML IV SOLN
INTRAVENOUS | Status: DC | PRN
  Administered 2015-06-05: 200 mg via INTRAVENOUS

## 2015-06-05 MED ORDER — RISAQUAD PO CAPS
1.0000 | ORAL_CAPSULE | Freq: Every day | ORAL | Status: DC
  Filled 2015-06-05 (×2): qty 1

## 2015-06-05 MED ORDER — SUGAMMADEX SODIUM 200 MG/2ML IV SOLN
INTRAVENOUS | Status: AC
Start: 2015-06-05 — End: 2015-06-05
  Filled 2015-06-05: qty 2

## 2015-06-05 MED ORDER — MAGNESIUM OXIDE 400 (241.3 MG) MG PO TABS
200.0000 mg | ORAL_TABLET | Freq: Every day | ORAL | Status: DC
  Filled 2015-06-05: qty 0.5

## 2015-06-05 MED ORDER — PROMETHAZINE HCL 25 MG/ML IJ SOLN: 12.5000 mg | Freq: Four times a day (QID) | INTRAMUSCULAR | Status: DC | PRN

## 2015-06-05 MED ORDER — LOPERAMIDE HCL 2 MG PO CAPS: 2.0000 mg | ORAL_CAPSULE | ORAL | Status: DC | PRN

## 2015-06-05 MED ORDER — NITROGLYCERIN 0.4 MG SL SUBL
0.4000 mg | SUBLINGUAL_TABLET | SUBLINGUAL | Status: DC | PRN
  Filled 2015-06-05: qty 1

## 2015-06-05 MED ORDER — SODIUM CHLORIDE 0.9% FLUSH
3.0000 mL | Freq: Two times a day (BID) | INTRAVENOUS | Status: DC
  Administered 2015-06-05 – 2015-06-06 (×2): 3 mL via INTRAVENOUS

## 2015-06-05 MED ORDER — ACETAMINOPHEN 650 MG RE SUPP
650.0000 mg | RECTAL | Status: DC | PRN
Start: 2015-06-05 — End: 2015-06-06

## 2015-06-05 MED ORDER — FENTANYL CITRATE (PF) 100 MCG/2ML IJ SOLN
INTRAMUSCULAR | Status: AC
  Administered 2015-06-05: 50 ug via INTRAVENOUS
  Filled 2015-06-05: qty 2

## 2015-06-05 MED ORDER — MENTHOL 3 MG MT LOZG: 1.0000 | LOZENGE | OROMUCOSAL | Status: DC | PRN

## 2015-06-05 MED ORDER — HYDROMORPHONE HCL 1 MG/ML IJ SOLN
INTRAMUSCULAR | Status: AC
  Filled 2015-06-05: qty 1

## 2015-06-05 MED ORDER — PROPOFOL 10 MG/ML IV BOLUS
INTRAVENOUS | Status: DC | PRN
  Administered 2015-06-05: 120 mg via INTRAVENOUS

## 2015-06-05 MED ORDER — BUPROPION HCL 100 MG PO TABS
100.0000 mg | ORAL_TABLET | Freq: Three times a day (TID) | ORAL | Status: DC
  Administered 2015-06-05 – 2015-06-06 (×2): 100 mg via ORAL
  Filled 2015-06-05 (×4): qty 1

## 2015-06-05 MED ORDER — ZOLPIDEM TARTRATE 5 MG PO TABS: 5.0000 mg | ORAL_TABLET | Freq: Every evening | ORAL | Status: DC | PRN

## 2015-06-05 MED ORDER — GABAPENTIN 300 MG PO CAPS
600.0000 mg | ORAL_CAPSULE | Freq: Three times a day (TID) | ORAL | Status: DC
  Filled 2015-06-05 (×2): qty 2

## 2015-06-05 MED ORDER — OXYCODONE-ACETAMINOPHEN 5-325 MG PO TABS: 1.0000 | ORAL_TABLET | ORAL | Status: DC | PRN

## 2015-06-05 MED ORDER — THROMBIN 5000 UNITS EX SOLR
OROMUCOSAL | Status: DC | PRN
  Administered 2015-06-05 (×2): 5 mL via TOPICAL

## 2015-06-05 MED ORDER — ONDANSETRON HCL 4 MG/2ML IJ SOLN
INTRAMUSCULAR | Status: AC
Start: 2015-06-05 — End: 2015-06-06
  Filled 2015-06-05: qty 2

## 2015-06-05 MED ORDER — PROPOFOL 10 MG/ML IV BOLUS
INTRAVENOUS | Status: AC
  Filled 2015-06-05: qty 20

## 2015-06-05 MED ORDER — ZOLPIDEM TARTRATE 5 MG PO TABS: 5.0000 mg | ORAL_TABLET | Freq: Every day | ORAL | Status: DC

## 2015-06-05 MED ORDER — PANTOPRAZOLE SODIUM 40 MG PO TBEC
40.0000 mg | DELAYED_RELEASE_TABLET | Freq: Every day | ORAL | Status: DC
  Administered 2015-06-06: 40 mg via ORAL
  Filled 2015-06-05: qty 1

## 2015-06-05 MED ORDER — ROCURONIUM BROMIDE 50 MG/5ML IV SOLN
INTRAVENOUS | Status: AC
  Filled 2015-06-05: qty 1

## 2015-06-05 MED ORDER — FLUTICASONE PROPIONATE 50 MCG/ACT NA SUSP: 2.0000 | NASAL | Status: DC | PRN

## 2015-06-05 MED ORDER — GABAPENTIN 100 MG PO CAPS
200.0000 mg | ORAL_CAPSULE | Freq: Every day | ORAL | Status: DC
  Administered 2015-06-05: 200 mg via ORAL
  Filled 2015-06-05: qty 2

## 2015-06-05 MED ORDER — POTASSIUM 99 MG PO TABS: 2.0000 | ORAL_TABLET | Freq: Every day | ORAL | Status: DC

## 2015-06-05 MED ORDER — AMLODIPINE BESYLATE 2.5 MG PO TABS
2.5000 mg | ORAL_TABLET | Freq: Every day | ORAL | Status: DC
  Administered 2015-06-06: 2.5 mg via ORAL
  Filled 2015-06-05: qty 1

## 2015-06-05 MED ORDER — HYOSCYAMINE SULFATE 0.125 MG PO TABS: 0.1250 mg | ORAL_TABLET | Freq: Four times a day (QID) | ORAL | Status: DC | PRN

## 2015-06-05 MED ORDER — HYDROMORPHONE HCL 1 MG/ML IJ SOLN
0.5000 mg | INTRAMUSCULAR | Status: DC | PRN
  Administered 2015-06-05 (×2): 1 mg via INTRAVENOUS
  Filled 2015-06-05: qty 1

## 2015-06-05 MED ORDER — 0.9 % SODIUM CHLORIDE (POUR BTL) OPTIME
TOPICAL | Status: DC | PRN
  Administered 2015-06-05: 1000 mL

## 2015-06-05 MED ORDER — PHENOL 1.4 % MT LIQD
1.0000 | OROMUCOSAL | Status: DC | PRN
  Administered 2015-06-05: 1 via OROMUCOSAL
  Filled 2015-06-05: qty 177

## 2015-06-05 MED ORDER — ONDANSETRON HCL 4 MG/2ML IJ SOLN: 4.0000 mg | Freq: Once | INTRAMUSCULAR | Status: DC | PRN

## 2015-06-05 MED ORDER — MORPHINE SULFATE (PF) 2 MG/ML IV SOLN
2.0000 mg | INTRAVENOUS | Status: DC | PRN
  Administered 2015-06-05 – 2015-06-06 (×2): 4 mg via INTRAVENOUS
  Filled 2015-06-05 (×2): qty 2

## 2015-06-05 MED ORDER — ASPIRIN EC 81 MG PO TBEC
81.0000 mg | DELAYED_RELEASE_TABLET | Freq: Every day | ORAL | Status: DC
  Filled 2015-06-05: qty 1

## 2015-06-05 MED ORDER — LACTATED RINGERS IV SOLN
INTRAVENOUS | Status: DC
  Administered 2015-06-05 (×3): via INTRAVENOUS

## 2015-06-05 MED ORDER — BENZONATATE 100 MG PO CAPS
100.0000 mg | ORAL_CAPSULE | Freq: Two times a day (BID) | ORAL | Status: DC | PRN
  Filled 2015-06-05: qty 1

## 2015-06-05 MED ORDER — EPHEDRINE SULFATE 50 MG/ML IJ SOLN
INTRAMUSCULAR | Status: DC | PRN
  Administered 2015-06-05 (×2): 10 mg via INTRAVENOUS

## 2015-06-05 MED ORDER — FENTANYL CITRATE (PF) 100 MCG/2ML IJ SOLN
25.0000 ug | INTRAMUSCULAR | Status: DC | PRN
  Administered 2015-06-05 (×3): 50 ug via INTRAVENOUS

## 2015-06-05 MED ORDER — SUCCINYLCHOLINE CHLORIDE 20 MG/ML IJ SOLN
INTRAMUSCULAR | Status: DC | PRN
  Administered 2015-06-05: 100 mg via INTRAVENOUS

## 2015-06-05 MED ORDER — HYDROCODONE-ACETAMINOPHEN 5-325 MG PO TABS
1.0000 | ORAL_TABLET | ORAL | Status: DC | PRN
  Administered 2015-06-06 (×2): 1 via ORAL
  Filled 2015-06-05 (×2): qty 1

## 2015-06-05 MED ORDER — BIOTIN 1000 MCG PO TABS: 1000.0000 ug | ORAL_TABLET | Freq: Every day | ORAL | Status: DC

## 2015-06-05 MED ORDER — PROMETHAZINE HCL 25 MG/ML IJ SOLN
12.5000 mg | Freq: Four times a day (QID) | INTRAMUSCULAR | Status: DC | PRN
  Administered 2015-06-05: 12.5 mg via INTRAVENOUS
  Filled 2015-06-05: qty 1

## 2015-06-05 MED ORDER — CEFAZOLIN SODIUM 1-5 GM-% IV SOLN: 1.0000 g | Freq: Three times a day (TID) | INTRAVENOUS | Status: DC

## 2015-06-05 MED ORDER — TRIAMTERENE-HCTZ 37.5-25 MG PO TABS
1.0000 | ORAL_TABLET | Freq: Every day | ORAL | Status: DC
  Administered 2015-06-06: 1 via ORAL
  Filled 2015-06-05: qty 1

## 2015-06-05 MED ORDER — MORPHINE SULFATE (PF) 2 MG/ML IV SOLN: 2.0000 mg | INTRAVENOUS | Status: DC | PRN

## 2015-06-05 MED ORDER — ACETAMINOPHEN 325 MG PO TABS: 650.0000 mg | ORAL_TABLET | ORAL | Status: DC | PRN

## 2015-06-05 MED ORDER — ONDANSETRON HCL 4 MG/2ML IJ SOLN
INTRAMUSCULAR | Status: DC | PRN
  Administered 2015-06-05: 4 mg via INTRAVENOUS

## 2015-06-05 MED ORDER — ONDANSETRON HCL 4 MG/2ML IJ SOLN
4.0000 mg | INTRAMUSCULAR | Status: DC | PRN
  Administered 2015-06-05 (×2): 4 mg via INTRAVENOUS
  Filled 2015-06-05 (×2): qty 2

## 2015-06-05 MED ORDER — VANCOMYCIN HCL IN DEXTROSE 1-5 GM/200ML-% IV SOLN
1000.0000 mg | Freq: Once | INTRAVENOUS | Status: DC
  Filled 2015-06-05: qty 200

## 2015-06-05 MED ORDER — LIDOCAINE HCL (CARDIAC) 20 MG/ML IV SOLN
INTRAVENOUS | Status: DC | PRN
  Administered 2015-06-05: 60 mg via INTRAVENOUS

## 2015-06-05 MED ORDER — FENTANYL CITRATE (PF) 250 MCG/5ML IJ SOLN
INTRAMUSCULAR | Status: AC
  Filled 2015-06-05: qty 5

## 2015-06-05 MED ORDER — LEVOTHYROXINE SODIUM 50 MCG PO TABS
50.0000 ug | ORAL_TABLET | Freq: Every day | ORAL | Status: DC
  Filled 2015-06-05: qty 1

## 2015-06-05 MED ORDER — HEMOSTATIC AGENTS (NO CHARGE) OPTIME
TOPICAL | Status: DC | PRN
  Administered 2015-06-05: 1 via TOPICAL

## 2015-06-05 MED ORDER — SODIUM CHLORIDE 0.9% FLUSH: 3.0000 mL | INTRAVENOUS | Status: DC | PRN

## 2015-06-05 MED ORDER — FENTANYL CITRATE (PF) 100 MCG/2ML IJ SOLN
INTRAMUSCULAR | Status: DC | PRN
  Administered 2015-06-05: 100 ug via INTRAVENOUS
  Administered 2015-06-05: 50 ug via INTRAVENOUS
  Administered 2015-06-05 (×2): 25 ug via INTRAVENOUS
  Administered 2015-06-05: 50 ug via INTRAVENOUS

## 2015-06-05 MED ORDER — THROMBIN 20000 UNITS EX SOLR
CUTANEOUS | Status: DC | PRN
  Administered 2015-06-05: 20000 [IU] via TOPICAL

## 2015-06-05 MED ORDER — ROCURONIUM BROMIDE 100 MG/10ML IV SOLN
INTRAVENOUS | Status: DC | PRN
  Administered 2015-06-05 (×2): 10 mg via INTRAVENOUS
  Administered 2015-06-05: 30 mg via INTRAVENOUS

## 2015-06-05 MED ORDER — SODIUM CHLORIDE 0.9 % IV SOLN: 250.0000 mL | INTRAVENOUS | Status: DC

## 2015-06-05 MED ORDER — CYCLOBENZAPRINE HCL 10 MG PO TABS
10.0000 mg | ORAL_TABLET | Freq: Three times a day (TID) | ORAL | Status: DC | PRN
  Administered 2015-06-05: 10 mg via ORAL
  Filled 2015-06-05 (×2): qty 1

## 2015-06-05 MED ORDER — SUCCINYLCHOLINE CHLORIDE 20 MG/ML IJ SOLN
INTRAMUSCULAR | Status: AC
  Filled 2015-06-05: qty 1

## 2015-06-05 MED ORDER — SODIUM CHLORIDE 0.9 % IR SOLN
Status: DC | PRN
  Administered 2015-06-05: 500 mL

## 2015-06-05 MED ORDER — DIPHENHYDRAMINE HCL 25 MG PO CAPS: 25.0000 mg | ORAL_CAPSULE | Freq: Every evening | ORAL | Status: DC | PRN

## 2015-06-05 MED ORDER — MEPERIDINE HCL 25 MG/ML IJ SOLN: 6.2500 mg | INTRAMUSCULAR | Status: DC | PRN

## 2015-06-05 SURGICAL SUPPLY — 58 items
APL SKNCLS STERI-STRIP NONHPOA (GAUZE/BANDAGES/DRESSINGS) ×1
BAG DECANTER FOR FLEXI CONT (MISCELLANEOUS) ×2 IMPLANT
BENZOIN TINCTURE PRP APPL 2/3 (GAUZE/BANDAGES/DRESSINGS) ×2 IMPLANT
BIT DRILL 13 (BIT) ×1 IMPLANT
BRUSH SCRUB EZ PLAIN DRY (MISCELLANEOUS) ×2 IMPLANT
BUR MATCHSTICK NEURO 3.0 LAGG (BURR) ×2 IMPLANT
CAGE PEEK 6X14X11 (Cage) ×8 IMPLANT
CANISTER SUCT 3000ML PPV (MISCELLANEOUS) ×2 IMPLANT
DRAPE C-ARM 42X72 X-RAY (DRAPES) ×4 IMPLANT
DRAPE LAPAROTOMY 100X72 PEDS (DRAPES) ×2 IMPLANT
DRAPE MICROSCOPE LEICA (MISCELLANEOUS) ×2 IMPLANT
DRAPE POUCH INSTRU U-SHP 10X18 (DRAPES) ×2 IMPLANT
DURAPREP 6ML APPLICATOR 50/CS (WOUND CARE) ×2 IMPLANT
ELECT COATED BLADE 2.86 ST (ELECTRODE) ×2 IMPLANT
ELECT REM PT RETURN 9FT ADLT (ELECTROSURGICAL) ×2
ELECTRODE REM PT RTRN 9FT ADLT (ELECTROSURGICAL) ×1 IMPLANT
GAUZE SPONGE 4X4 12PLY STRL (GAUZE/BANDAGES/DRESSINGS) ×2 IMPLANT
GAUZE SPONGE 4X4 16PLY XRAY LF (GAUZE/BANDAGES/DRESSINGS) IMPLANT
GLOVE BIOGEL PI IND STRL 7.0 (GLOVE) IMPLANT
GLOVE BIOGEL PI INDICATOR 7.0 (GLOVE) ×3
GLOVE ECLIPSE 9.0 STRL (GLOVE) ×2 IMPLANT
GLOVE EXAM NITRILE LRG STRL (GLOVE) IMPLANT
GLOVE EXAM NITRILE MD LF STRL (GLOVE) IMPLANT
GLOVE EXAM NITRILE XL STR (GLOVE) IMPLANT
GLOVE EXAM NITRILE XS STR PU (GLOVE) IMPLANT
GLOVE SURG SS PI 6.5 STRL IVOR (GLOVE) ×3 IMPLANT
GOWN STRL REUS W/ TWL LRG LVL3 (GOWN DISPOSABLE) IMPLANT
GOWN STRL REUS W/ TWL XL LVL3 (GOWN DISPOSABLE) IMPLANT
GOWN STRL REUS W/TWL 2XL LVL3 (GOWN DISPOSABLE) IMPLANT
GOWN STRL REUS W/TWL LRG LVL3 (GOWN DISPOSABLE) ×4
GOWN STRL REUS W/TWL XL LVL3 (GOWN DISPOSABLE) ×2
HALTER HD/CHIN CERV TRACTION D (MISCELLANEOUS) ×2 IMPLANT
HEMOSTAT POWDER KIT SURGIFOAM (HEMOSTASIS) ×1 IMPLANT
HEMOSTAT POWDER SURGIFOAM 1G (HEMOSTASIS) ×1 IMPLANT
KIT BASIN OR (CUSTOM PROCEDURE TRAY) ×2 IMPLANT
KIT ROOM TURNOVER OR (KITS) ×2 IMPLANT
NDL SPNL 20GX3.5 QUINCKE YW (NEEDLE) ×1 IMPLANT
NEEDLE SPNL 20GX3.5 QUINCKE YW (NEEDLE) ×2 IMPLANT
NS IRRIG 1000ML POUR BTL (IV SOLUTION) ×2 IMPLANT
PACK LAMINECTOMY NEURO (CUSTOM PROCEDURE TRAY) ×2 IMPLANT
PAD ARMBOARD 7.5X6 YLW CONV (MISCELLANEOUS) ×6 IMPLANT
PLATE 4 75XNS SPNE CVD ANT T (Plate) IMPLANT
PLATE 4 ATLANTIS TRANS (Plate) ×2 IMPLANT
RUBBERBAND STERILE (MISCELLANEOUS) ×4 IMPLANT
SCREW ST FIX 4 ATL 3120213 (Screw) ×10 IMPLANT
SPACER SPNL 11X14X6XPEEK CVD (Cage) IMPLANT
SPCR SPNL 11X14X6XPEEK CVD (Cage) ×4 IMPLANT
SPONGE INTESTINAL PEANUT (DISPOSABLE) ×2 IMPLANT
SPONGE SURGIFOAM ABS GEL SZ50 (HEMOSTASIS) ×2 IMPLANT
STRIP CLOSURE SKIN 1/2X4 (GAUZE/BANDAGES/DRESSINGS) ×2 IMPLANT
SUT VIC AB 3-0 SH 8-18 (SUTURE) ×2 IMPLANT
SUT VIC AB 4-0 RB1 18 (SUTURE) ×3 IMPLANT
TAPE CLOTH 4X10 WHT NS (GAUZE/BANDAGES/DRESSINGS) ×1 IMPLANT
TAPE CLOTH SURG 4X10 WHT LF (GAUZE/BANDAGES/DRESSINGS) ×1 IMPLANT
TOWEL OR 17X24 6PK STRL BLUE (TOWEL DISPOSABLE) ×2 IMPLANT
TOWEL OR 17X26 10 PK STRL BLUE (TOWEL DISPOSABLE) ×2 IMPLANT
TRAP SPECIMEN MUCOUS 40CC (MISCELLANEOUS) ×2 IMPLANT
WATER STERILE IRR 1000ML POUR (IV SOLUTION) ×2 IMPLANT

## 2015-06-05 NOTE — Anesthesia Postprocedure Evaluation (Signed)
Anesthesia Post Note  Patient: Cheyenne Pierce  Procedure(s) Performed: Procedure(s) (LRB): ANTERIOR CERVICAL DECOMPRESSION/DISCECTOMY FUSION CERVICAL THREE-FOUR,CERVICAL FOUR-FIVE,CERVICAL FIVE-SIX,CERVICAL SIX-SEVEN (N/A)  Patient location during evaluation: PACU Anesthesia Type: General Level of consciousness: awake and alert Pain management: pain level controlled Vital Signs Assessment: post-procedure vital signs reviewed and stable Respiratory status: spontaneous breathing, nonlabored ventilation, respiratory function stable and patient connected to nasal cannula oxygen Cardiovascular status: blood pressure returned to baseline and stable Postop Assessment: no signs of nausea or vomiting Anesthetic complications: no    Last Vitals:  Filed Vitals:   06/05/15 1415 06/05/15 1430  BP: 159/81 168/82  Pulse: 76 81  Temp:    Resp: 21 23    Last Pain:  Filed Vitals:   06/05/15 1450  PainSc: 9                  Kalyb Pemble A.

## 2015-06-05 NOTE — Progress Notes (Signed)
Bladder scan showed 871 ml. In and out cath done. 1100 ml obtained of clear yellow urine. Tolerated procedure well.

## 2015-06-05 NOTE — Op Note (Signed)
Date of procedure: 06/05/2015  Date of dictation: Same  Service: Neurosurgery  Preoperative diagnosis: C3-4, C4-5, C5-6, C6-7 spondylosis with stenosis  Postoperative diagnosis: Same  Procedure Name: C3-4, C4-5, C5-6, C6-7 anterior cervical discectomy with interbody fusion utilizing interbody peek cages, locally harvested autograft, and anterior plate instrumentation  Surgeon:Charleene Callegari A.Rubi Tooley, M.D.  Asst. Surgeon: Ronnald Ramp  Anesthesia: General  Indication: 81 year old female with severe neck and bilateral upper extremity pain paresthesias and weakness. Workup demonstrates evidence of marked multilevel cervical spondylosis with stenosis and spinal cord compression. Patient presents now for decompressive surgery for her compressive myelopathy in hopes of improving her symptoms.  Operative note: After induction of anesthesia, patient positioned supine with neck slightly extended and held in place with halter traction. Anterior cervical region prepped and draped sterilely. Incision made overlying C5. Dissection proceeds down to the platysma. Platysma was then divided vertically and dissection proceeds along the medial border of the sternocleidomastoid muscle and carotid sheath. Trachea and esophagus are mobilized and retracted towards the left. Prevertebral fascia was stripped off the anterior spinal column. Longus colli muscles elevated bilaterally using electrocautery. Deep self retaining retractors placed intraoperative fluoroscopy is used and the levels were confirmed. Disc spaces at C3-4, C4-5, C5-6 and C6-7 were incised with 15 blade. Discectomies were then performed using pituitary rongeurs, Kerrison rongeurs, high-speed drill, and curettes all elements the disc removed to the level of the posterior annulus. Microscope was then brought into the field and used throughout the remainder of the discectomies at all 4 levels. Remaining aspects of annulus and osteophytes removed using high-speed drill down  to level of the posterior longitudinal ligament. Posterior longitudinal ligament was then elevated and resected in a piecemeal fashion. Underlying thecal sac was then identified. A wide central decompression was then performed by undercutting the bodies at C3 and C4. Decompression then proceeded out to each neural foramen. Wide anterior foraminotomies were then performed on the course exiting C4 nerve root. At this point a very thorough discectomy been achieved. There was no evidence of injury to the thecal sac and nerve roots. Procedure then repeated at C4-5, C5-6 and C6-7 all without complications. Wounds and irrigated MI solution. 6 mm Medtronic anatomic peek cages packed with local autograft were impacted into place and recessed slightly from the anterior cortical margin at all 4 levels. 75 mm Atlantis translational plate was then placed over the C3-C7 level. This an attachment or fluoroscopic guidance using 13 middle ear fixed angle screws 2 each at L5 levels. All 10 screws given a final tightening and found to be solidly within the bone. Locking screws and gauge at all levels. Final images revealed good position of the cages and plates with screws at the proper upper level with normal alignment of the spine. Wound is irrigated one final time. Hemostasis was assured with bipolar chart. Wounds and close in layers with Vicryl sutures. Steri-Strips and sterile dressing were applied. There were no apparent complications. Patient tolerated the procedure well and she returns to the recovery room postop.

## 2015-06-05 NOTE — Anesthesia Procedure Notes (Signed)
Procedure Name: Intubation Date/Time: 06/05/2015 10:57 AM Performed by: Lavell Luster Pre-anesthesia Checklist: Patient identified, Emergency Drugs available, Suction available, Patient being monitored and Timeout performed Patient Re-evaluated:Patient Re-evaluated prior to inductionOxygen Delivery Method: Circle system utilized Preoxygenation: Pre-oxygenation with 100% oxygen Intubation Type: IV induction Ventilation: Mask ventilation without difficulty Laryngoscope Size: Mac, 3 and Glidescope Grade View: Grade I Tube type: Oral Tube size: 7.0 mm Number of attempts: 1 Airway Equipment and Method: Stylet Placement Confirmation: ETT inserted through vocal cords under direct vision,  positive ETCO2 and breath sounds checked- equal and bilateral Secured at: 21 cm Tube secured with: Tape Dental Injury: Teeth and Oropharynx as per pre-operative assessment

## 2015-06-05 NOTE — Transfer of Care (Signed)
Immediate Anesthesia Transfer of Care Note  Patient: Cheyenne Pierce  Procedure(s) Performed: Procedure(s) with comments: ANTERIOR CERVICAL DECOMPRESSION/DISCECTOMY FUSION CERVICAL THREE-FOUR,CERVICAL FOUR-FIVE,CERVICAL FIVE-SIX,CERVICAL SIX-SEVEN (N/A) - right side approach  Patient Location: PACU  Anesthesia Type:General  Level of Consciousness: awake and alert   Airway & Oxygen Therapy: Patient Spontanous Breathing and Patient connected to nasal cannula oxygen  Post-op Assessment: Report given to RN, Post -op Vital signs reviewed and stable and Patient moving all extremities X 4  Post vital signs: Reviewed and stable  Last Vitals:  Filed Vitals:   06/05/15 0900  BP: 150/79  Pulse: 73  Temp: [id number] C    Complications: No apparent anesthesia complications

## 2015-06-05 NOTE — Progress Notes (Signed)
Pharmacy Consult to dose vancomycin for surgical prophylaxis. PCN allergy = diarrhea. She was given vanc 1 gm preop at 10 am. Wt 56.7 kg, creat 0.65. Plan: vanc 1 gm x 1 dose 3/1 at 10 am to provide 48 hrs post op abx coverage Eudelia Bunch, Pharm.D. [phone] 06/05/2015 3:01 PM

## 2015-06-05 NOTE — Progress Notes (Signed)
PHARMACIST - PHYSICIAN ORDER COMMUNICATION  CONCERNING: P&T Medication Policy on Herbal Medications  DESCRIPTION:  This patient's order for:  Biotin  has been noted.  This product(s) is classified as an "herbal" or natural product. Due to a lack of definitive safety studies or FDA approval, nonstandard manufacturing practices, plus the potential risk of unknown drug-drug interactions while on inpatient medications, the Pharmacy and Therapeutics Committee does not permit the use of "herbal" or natural products of this type within [facility].   ACTION TAKEN: The pharmacy department is unable to verify this order at this time and your patient has been informed of this safety policy. Please reevaluate patient's clinical condition at discharge and address if the herbal or natural product(s) should be resumed at that time.   Davi Kroon, PharmD Clinical Pharmacist [facility]   

## 2015-06-05 NOTE — Progress Notes (Signed)
Patient states allergy to Penicillins . Given Vancomycin preop. Dr. Annette Stable updated. Plan: to dc ancef and order Vancomycin

## 2015-06-05 NOTE — Anesthesia Preprocedure Evaluation (Addendum)
Anesthesia Evaluation  Patient identified by MRN, date of birth, ID band Patient awake    Reviewed: Allergy & Precautions, NPO status , Patient's Chart, lab work & pertinent test results, reviewed documented beta blocker date and time   Airway Mallampati: III  TM Distance: >3 FB Neck ROM: Full    Dental  (+) Partial Upper, Partial Lower, Caps   Pulmonary former smoker,    Pulmonary exam normal breath sounds clear to auscultation       Cardiovascular hypertension, Pt. on medications + angina Normal cardiovascular exam+ Valvular Problems/Murmurs  Rhythm:Regular Rate:Normal     Neuro/Psych  Headaches, PSYCHIATRIC DISORDERS Anxiety Depression Peripheral neuropathy Neuromuscular disease    GI/Hepatic hiatal hernia, GERD  Medicated and Controlled,Hx/o diverticulosis IBS   Endo/Other  Hypothyroidism   Renal/GU   negative genitourinary   Musculoskeletal  (+) Arthritis , Osteoarthritis,  Cervical stenosis Cervical and Lumbar spondylosis   Abdominal   Peds  Hematology   Anesthesia Other Findings   Reproductive/Obstetrics                          Anesthesia Physical Anesthesia Plan  ASA: III  Anesthesia Plan: General   Post-op Pain Management:    Induction: Intravenous  Airway Management Planned: Oral ETT  Additional Equipment:   Intra-op Plan:   Post-operative Plan: Extubation in OR  Informed Consent: I have reviewed the patients History and Physical, chart, labs and discussed the procedure including the risks, benefits and alternatives for the proposed anesthesia with the patient or authorized representative who has indicated his/her understanding and acceptance.   Dental advisory given  Plan Discussed with: Anesthesiologist, CRNA and Surgeon  Anesthesia Plan Comments:         Anesthesia Quick Evaluation

## 2015-06-05 NOTE — H&P (Signed)
Cheyenne Pierce is an 81 y.o. female.   Chief Complaint: Neck pain and bilateral upper extremity weakness HPI: 81 year old female with progressive neck and bilateral upper extremity pain, per seizures and weakness. Workup demonstrates evidence of marked multilevel spondylosis with stenosis and spinal cord compression. Patient presents now for 4 level anterior cervical decompression and fusion in hopes of improving her symptoms.  Past Medical History  Diagnosis Date  . Allergy   . Peripheral neuropathy (HCC)     in feet, ESI have helped Cheyenne Pierce)  . DDD (degenerative disc disease), cervical     moderate to severe multilevel cervical spine DDD, most severe central stenosis C5/6, significant R foraminal stenosis (Cheyenne Pierce)  . Angina pectoris (Cheyenne Pierce)   . Mass of vagina     referral to Cheyenne Pierce 03/2014  . DDD (degenerative disc disease), lumbar     multilevel degenerative changes mostly L3/4 with mod spinal canal and mild bilat neural foraminal stenosis, moderate R neural froaminal narrowing L4/5 (Cheyenne Pierce)  . DDD (degenerative disc disease), cervical     throughout spine   . Heart murmur   . History of bronchitis     last time Spring 2016  . Headache   . Vertigo     takes Antivert as needed  . Peripheral neuropathy (HCC)     takes Gabapentin daily  . Joint pain   . Joint swelling   . Chronic back pain     scoliosis/DDD  . GERD (gastroesophageal reflux disease)     takes Protonix daily  . History of hiatal hernia   . History of gastric ulcer   . IBS (irritable bowel syndrome)     diarrhea-takes Immodium as needed  . Hypertension     takes Amlodipine and Maxzide daily  . History of colon polyps     benign  . Diverticulosis   . Urinary frequency   . Urinary urgency   . Nocturia   . Hypothyroidism     takes SYnthroid daily  . Cataract     left eye,immature  . Anxiety     takes Wellbutrin daily  . Depression   . Insomnia     takes Benadryl nightly    Past  Surgical History  Procedure Laterality Date  . Appendectomy  1948  . Abdominal hysterectomy  1968    left ovary remains  . Ovarian cyst surgery  1963    cyst on ovaries  . Hernia repair    . Colostomy w/ rectocele repair  06/2004  . Ventral hernia repair  08/2008    abdominal wall, lysis of adhesions  . Colonoscopy  07/2009    Cheyenne Pierce), normal per pt  . Epidural steroid injections  multiple latest 05/2014, 07/2014    help periph neuropathy Cheyenne Pierce)  . US echocardiography  2009    mild aortic/mitral insuff, ER [id number], mild diastolic dysfunction  . Nm myoview ltd  2009    WNL per report  . Dexa  2013    T -1.0  . Colonoscopy  06/2014    int hem, rpt 5 yrs (Cheyenne Pierce)  . Tonsillectomy    . Cardiac catheterization  09/26/1998  . Bladder suspension with vaginal sling    . Epidural infections    . Esophagogastroduodenoscopy      Family History  Problem Relation Age of Onset  . Heart disease Mother   . Stroke Brother     2 strokes  . Hypertension Neg Hx   . Diabetes Neg Hx   .  Cancer Neg Hx     breast or colon cancer, no history   Social History:  reports that she has quit smoking. She has never used smokeless tobacco. She reports that she does not drink alcohol or use illicit drugs.  Allergies:  Allergies  Allergen Reactions  . Codeine Nausea Only  . Penicillins Diarrhea  . Prednisone Swelling    REACTION: swelling  . Zoloft [Sertraline Hcl] Rash    Oral blisters and skin rash  . Amoxicillin     REACTION: diarrhea  . Codeine Phosphate     REACTION: nausea  . Metronidazole Diarrhea    REACTION: Rash, tongue ulcers  . Oxcarbazepine     REACTION: rash  . Perindopril Erbumine     REACTION: unspecified  . Potassium-Containing Compounds Other (See Comments)    abd pain with oral potassium pills, h/o ulcers  . Prozac [Fluoxetine Hcl] Other (See Comments)    Trouble sleeping, night sweats, rapid hear beat  . Tramadol Hcl     REACTION: diarrhea    Medications Prior  to Admission  Medication Sig Dispense Refill  . acetaminophen (TYLENOL) 500 MG tablet Take 500 mg by mouth every 6 (six) hours as needed.    Marland Kitchen amLODipine (NORVASC) 2.5 MG tablet Take 1 tablet (2.5 mg total) by mouth daily. 30 tablet 11  . Biotin 1000 MCG tablet Take 1,000 mcg by mouth daily.    Marland Kitchen buPROPion (WELLBUTRIN) 100 MG tablet Take 1 tablet (100 mg total) by mouth 3 (three) times daily. 90 tablet 11  . diphenhydrAMINE (BENADRYL) 25 mg capsule Take 25 mg by mouth at bedtime.     . gabapentin (NEURONTIN) 100 MG capsule Take 1 capsule (100 mg total) by mouth 2 (two) times daily. (Patient taking differently: Take 200 mg by mouth at bedtime. ) 60 capsule 5  . gabapentin (NEURONTIN) 300 MG capsule Take 2 capsules (600 mg total) by mouth 3 (three) times daily. 180 capsule 5  . hyoscyamine (LEVSIN, ANASPAZ) 0.125 MG tablet take 1 tablet by mouth AS NEEDED 30 tablet 3  . Lactobacillus Rhamnosus, GG, (CULTURELLE PO) Take 1 capsule by mouth daily.    Marland Kitchen levothyroxine (SYNTHROID, LEVOTHROID) 50 MCG tablet Take 1 tablet (50 mcg total) by mouth daily. Take extra tablet on Mon/Friday 110 tablet 3  . loperamide (IMODIUM) 2 MG capsule Take 2 mg by mouth as needed for diarrhea or loose stools.    . Magnesium 250 MG TABS Take by mouth daily.    . meclizine (ANTIVERT) 25 MG tablet take 1 tablet by mouth three times a day if needed 30 tablet 1  . Multiple Vitamins-Minerals (CENTRUM SILVER PO) Take by mouth.      . pantoprazole (PROTONIX) 40 MG tablet Take 1 tablet (40 mg total) by mouth daily. 90 tablet 3  . Potassium 99 MG TABS Take 2 tablets by mouth daily.    . Tragacanth (ASTRAGALUS ROOT) POWD Take by mouth daily.      Marland Kitchen triamterene-hydrochlorothiazide (MAXZIDE-25) 37.5-25 MG tablet take 1 tablet by mouth once daily 30 tablet 11  . aspirin 81 MG tablet Take 81 mg by mouth. Twice a week    . benzonatate (TESSALON) 100 MG capsule take 1 capsule by mouth twice a day if needed for cough 30 capsule 0  .  fluticasone (FLONASE) 50 MCG/ACT nasal spray Place 2 sprays into the nose as needed.    . nitroGLYCERIN (NITROSTAT) 0.4 MG SL tablet Place 0.4 mg under the tongue every 5 (five)  minutes as needed.      No results found for this or any previous visit (from the past 48 hour(s)). No results found.  Pertinent items noted in HPI and remainder of comprehensive ROS otherwise negative.  Blood pressure 150/79, pulse 73, temperature 97 F (36.1 C), temperature source Oral, height 5' (1.524 m), weight 56.7 kg (125 lb), SpO2 98 %.  The patient is awake and alert. She is oriented and appropriate. Examination in general finds her to be a medium built medium frame female who appears moderately uncomfortable examination of her head ears eyes and 13 unremarkable. Neck is stiff with limited range of motion. No crepitus. Airway midline. Carotid pulses normal. Examination of her chest and abdomen are benign. Extremities are free from injury or deformity. Neurologically she is awake and alert. She is oriented and appropriate. Her speech is fluent. Cranial nerve function is intact. Motor examination reveals weakness of both upper extremities involving her wrist extensors her triceps muscles and her grips and her intrinsics muscle all grating out at 4 over 5. She has some spastic weakness in both lower extremities. Reflexes are normal active. There is noticeable long track signs. Gait and posture normal. Assessment/Plan  The patient has multilevel severe cervical spondylosis and stenosis at C3-4, C4-5, C5-6 and C6-7. She presents now for 4 level anterior cervical decompression and fusion by means of a C3-4, C4-5, C5-6, C6-7 anterior cervical discectomy with interbody fusion utilizing interbody peek cages, locally harvested autograft, and anterior plate instrumentation. Risks and benefits of been explained. Patient wishes to proceed.  Lyndzee Kliebert A 06/05/2015, 10:38 AM

## 2015-06-05 NOTE — Brief Op Note (Signed)
06/05/2015  1:28 PM  PATIENT:  Cheyenne Pierce  81 y.o. female  PRE-OPERATIVE DIAGNOSIS:  Stenosis  POST-OPERATIVE DIAGNOSIS:  Stenosis  PROCEDURE:  Procedure(s) with comments: ANTERIOR CERVICAL DECOMPRESSION/DISCECTOMY FUSION CERVICAL THREE-FOUR,CERVICAL FOUR-FIVE,CERVICAL FIVE-SIX,CERVICAL SIX-SEVEN (N/A) - right side approach  SURGEON:  Surgeon(s) and Role:    * Earnie Larsson, MD - Primary    * Eustace Moore, MD - Assisting  PHYSICIAN ASSISTANT:   ASSISTANTS:    ANESTHESIA:   general  EBL:  Total I/O In: 1000 [I.V.:1000] Out: 200 [Blood:200]  BLOOD ADMINISTERED:none  DRAINS: none   LOCAL MEDICATIONS USED:  MARCAINE     SPECIMEN:  No Specimen  DISPOSITION OF SPECIMEN:  N/A  COUNTS:  YES  TOURNIQUET:  * No tourniquets in log *  DICTATION: .Dragon Dictation  PLAN OF CARE: Admit to inpatient   PATIENT DISPOSITION:  PACU - hemodynamically stable.   Delay start of Pharmacological VTE agent (>24hrs) due to surgical blood loss or risk of bleeding: yes

## 2015-06-06 ENCOUNTER — Encounter (HOSPITAL_COMMUNITY): Payer: Self-pay | Admitting: Neurosurgery

## 2015-06-06 MED ORDER — CYCLOBENZAPRINE HCL 10 MG PO TABS: 10.0000 mg | ORAL_TABLET | Freq: Three times a day (TID) | ORAL | Status: DC | PRN

## 2015-06-06 MED ORDER — HYDROCODONE-ACETAMINOPHEN 5-325 MG PO TABS: 1.0000 | ORAL_TABLET | ORAL | Status: DC | PRN

## 2015-06-06 NOTE — Progress Notes (Signed)
Patient complained of  CP last night at the beginning of the shift and complained again at this time. Last night, CP went away even before RN attempt to give nitroglycerin tab. This morning CP pain is described as sharp, no heaviness, no pain on inspiration and doesntt radiate to shoulder/arm. Nitro refused by patient,  Morphine IV given instead for both surgical pain and CP rating it as 9/10. CP reassessed after 10 minutes and  Pt gave a pain score of 7/10. Patient noted to be very anxious verbalizing to RN that her husband died of heart attack and would want to have    06/06/15 0338  Vitals  Temp 98.5 F (36.9 C)  Temp Source Axillary  BP 114/64 mmHg  BP Location Right Arm  BP Method Automatic  Patient Position (if appropriate) Lying  Pulse Rate 89  Pulse Rate Source Monitor  Resp 18  Oxygen Therapy  SpO2 97 %  O2 Device Room Air   An ekg check. EKG done, result : NSR HR 86, no ectopy noted. After EKG done and resulted, patient verbalized that she has now peace of mind and CP is better to almost gone. Will cont to monitor pt closely. Vital signs stable.

## 2015-06-06 NOTE — Discharge Instructions (Signed)

## 2015-06-06 NOTE — Discharge Summary (Signed)
Physician Discharge Summary  Patient ID: Cheyenne Pierce MRN: [id number] DOB/AGE: June 16, 1934 81 y.o.  Admit date: 06/05/2015 Discharge date: 06/06/2015  Admission Diagnoses:  Discharge Diagnoses:  Active Problems:   Cervical spinal stenosis (C3 through C7)   Discharged Condition: good  Hospital Course: Patient admitted the hospital where she underwent uncomplicated 4 level anterior cervical decompression and fusion. Postoperatively she is doing reasonably well. Neck and upper extremity symptoms are improved. Still with a little bit of weakness in her left shoulder but this is progressing. Swallowing reasonably well. Mobilizing with therapy. Ready for discharge home.  Consults:   Significant Diagnostic Studies:   Treatments:   Discharge Exam: Blood pressure 114/67, pulse 82, temperature 98 F (36.7 C), temperature source Oral, resp. rate 18, height 5' (1.524 m), weight 56.7 kg (125 lb), SpO2 100 %. Awake and alert. Oriented and appropriate. Cranial nerve function intact. Motor examination with slight weakness of left deltoid grating out at 4 over 5 otherwise motor strength intact. Sensory examination nonfocal. Deep interactions normal active. Wound healing well. Chest and abdomen benign. Next soft. Disposition: 01-Home or Self Care     Medication List    TAKE these medications        acetaminophen 500 MG tablet  Commonly known as:  TYLENOL  Take 500 mg by mouth every 6 (six) hours as needed.     amLODipine 2.5 MG tablet  Commonly known as:  NORVASC  Take 1 tablet (2.5 mg total) by mouth daily.     aspirin 81 MG tablet  Take 81 mg by mouth. Twice a week     Astragalus Root Powd  Take by mouth daily.     benzonatate 100 MG capsule  Commonly known as:  TESSALON  take 1 capsule by mouth twice a day if needed for cough     Biotin 1000 MCG tablet  Take 1,000 mcg by mouth daily.     buPROPion 100 MG tablet  Commonly known as:  WELLBUTRIN  Take 1 tablet (100 mg total)  by mouth 3 (three) times daily.     CENTRUM SILVER PO  Take by mouth.     CULTURELLE PO  Take 1 capsule by mouth daily.     cyclobenzaprine 10 MG tablet  Commonly known as:  FLEXERIL  Take 1 tablet (10 mg total) by mouth 3 (three) times daily as needed for muscle spasms.     diphenhydrAMINE 25 mg capsule  Commonly known as:  BENADRYL  Take 25 mg by mouth at bedtime.     fluticasone 50 MCG/ACT nasal spray  Commonly known as:  FLONASE  Place 2 sprays into the nose as needed.     gabapentin 100 MG capsule  Commonly known as:  NEURONTIN  Take 1 capsule (100 mg total) by mouth 2 (two) times daily.     gabapentin 300 MG capsule  Commonly known as:  NEURONTIN  Take 2 capsules (600 mg total) by mouth 3 (three) times daily.     HYDROcodone-acetaminophen 5-325 MG tablet  Commonly known as:  NORCO/VICODIN  Take 1-2 tablets by mouth every 4 (four) hours as needed (mild pain).     hyoscyamine 0.125 MG tablet  Commonly known as:  LEVSIN, ANASPAZ  take 1 tablet by mouth AS NEEDED     levothyroxine 50 MCG tablet  Commonly known as:  SYNTHROID, LEVOTHROID  Take 1 tablet (50 mcg total) by mouth daily. Take extra tablet on Mon/Friday     loperamide 2 MG capsule  Commonly known as:  IMODIUM  Take 2 mg by mouth as needed for diarrhea or loose stools.     Magnesium 250 MG Tabs  Take by mouth daily.     meclizine 25 MG tablet  Commonly known as:  ANTIVERT  take 1 tablet by mouth three times a day if needed     nitroGLYCERIN 0.4 MG SL tablet  Commonly known as:  NITROSTAT  Place 0.4 mg under the tongue every 5 (five) minutes as needed.     pantoprazole 40 MG tablet  Commonly known as:  PROTONIX  Take 1 tablet (40 mg total) by mouth daily.     Potassium 99 MG Tabs  Take 2 tablets by mouth daily.     triamterene-hydrochlorothiazide 37.5-25 MG tablet  Commonly known as:  MAXZIDE-25  take 1 tablet by mouth once daily           Follow-up Information    Follow up with  Charlie Pitter, MD.   Specialty:  Neurosurgery   Contact information:   [address] [place] [address] [phone]       Signed: Charlie Pitter 06/06/2015, 11:51 AM

## 2015-06-06 NOTE — Progress Notes (Signed)
Pt given D/C instructions with Rx's, verbal understanding was provided. Pt's incision is open to air and has no sign of infection. Pt's IV was removed prior to D/C. Pt D/C'd home via wheelchair @ 1545 per MD order. Pt is stable @ D/C and has no other needs at this time. Holli Humbles, RN

## 2015-06-08 ENCOUNTER — Telehealth: Payer: Self-pay | Admitting: *Deleted

## 2015-06-08 NOTE — Telephone Encounter (Signed)
pt called to cancel due to she just had surgery. she will call back to r/s...td

## 2015-06-11 ENCOUNTER — Encounter: Payer: Medicare Other | Admitting: Pain Medicine

## 2015-07-04 DIAGNOSIS — M4802 Spinal stenosis, cervical region: Secondary | ICD-10-CM | POA: Diagnosis not present

## 2015-07-31 ENCOUNTER — Ambulatory Visit: Payer: Medicare Other | Admitting: Family Medicine

## 2015-08-01 DIAGNOSIS — I10 Essential (primary) hypertension: Secondary | ICD-10-CM | POA: Diagnosis not present

## 2015-08-01 DIAGNOSIS — M4802 Spinal stenosis, cervical region: Secondary | ICD-10-CM | POA: Diagnosis not present

## 2015-08-01 DIAGNOSIS — Z6824 Body mass index (BMI) 24.0-24.9, adult: Secondary | ICD-10-CM | POA: Diagnosis not present

## 2015-08-06 ENCOUNTER — Other Ambulatory Visit: Payer: Self-pay | Admitting: Family Medicine

## 2015-08-22 ENCOUNTER — Ambulatory Visit: Payer: Medicare Other | Admitting: Cardiovascular Disease

## 2015-08-28 DIAGNOSIS — R293 Abnormal posture: Secondary | ICD-10-CM | POA: Diagnosis not present

## 2015-08-28 DIAGNOSIS — M542 Cervicalgia: Secondary | ICD-10-CM | POA: Diagnosis not present

## 2015-08-31 DIAGNOSIS — H2513 Age-related nuclear cataract, bilateral: Secondary | ICD-10-CM | POA: Diagnosis not present

## 2015-09-11 ENCOUNTER — Other Ambulatory Visit: Payer: Self-pay | Admitting: Family Medicine

## 2015-09-13 DIAGNOSIS — M4802 Spinal stenosis, cervical region: Secondary | ICD-10-CM | POA: Diagnosis not present

## 2015-09-14 ENCOUNTER — Other Ambulatory Visit: Payer: Self-pay | Admitting: Neurosurgery

## 2015-09-14 DIAGNOSIS — M4802 Spinal stenosis, cervical region: Secondary | ICD-10-CM

## 2015-09-26 ENCOUNTER — Ambulatory Visit
Admission: RE | Admit: 2015-09-26 | Discharge: 2015-09-26 | Disposition: A | Discharge status: Home / Self Care | Payer: Medicare Other | Source: Ambulatory Visit | Attending: Neurosurgery | Admitting: Neurosurgery

## 2015-09-26 DIAGNOSIS — M4802 Spinal stenosis, cervical region: Secondary | ICD-10-CM

## 2015-09-26 DIAGNOSIS — M4322 Fusion of spine, cervical region: Secondary | ICD-10-CM | POA: Diagnosis not present

## 2015-10-01 ENCOUNTER — Telehealth: Payer: Self-pay | Admitting: Pain Medicine

## 2015-10-01 NOTE — Telephone Encounter (Signed)
Patient having a lot of pain in lower back and wants to come in for epidural ASAP, she had Cervical surgery but lower back in really hurting, please call

## 2015-10-01 NOTE — Telephone Encounter (Signed)
Spoke with patient.  She is requesting a Lumbar Epidural Steroid injection.  Patient has PRN LESI ordered. Pre procedure inistructions given.  Call transferred to secretary so that appointment can be made.

## 2015-10-04 ENCOUNTER — Ambulatory Visit: Payer: Medicare Other | Attending: Pain Medicine | Admitting: Pain Medicine

## 2015-10-04 ENCOUNTER — Encounter: Payer: Self-pay | Admitting: Pain Medicine

## 2015-10-04 VITALS — BP 144/74 | HR 63 | Resp 13

## 2015-10-04 DIAGNOSIS — I69911 Memory deficit following unspecified cerebrovascular disease: Secondary | ICD-10-CM | POA: Insufficient documentation

## 2015-10-04 DIAGNOSIS — K210 Gastro-esophageal reflux disease with esophagitis: Secondary | ICD-10-CM | POA: Diagnosis not present

## 2015-10-04 DIAGNOSIS — K4090 Unilateral inguinal hernia, without obstruction or gangrene, not specified as recurrent: Secondary | ICD-10-CM | POA: Diagnosis not present

## 2015-10-04 DIAGNOSIS — M4806 Spinal stenosis, lumbar region: Secondary | ICD-10-CM | POA: Diagnosis not present

## 2015-10-04 DIAGNOSIS — F411 Generalized anxiety disorder: Secondary | ICD-10-CM | POA: Insufficient documentation

## 2015-10-04 DIAGNOSIS — R7303 Prediabetes: Secondary | ICD-10-CM | POA: Insufficient documentation

## 2015-10-04 DIAGNOSIS — E785 Hyperlipidemia, unspecified: Secondary | ICD-10-CM | POA: Insufficient documentation

## 2015-10-04 DIAGNOSIS — Z79899 Other long term (current) drug therapy: Secondary | ICD-10-CM | POA: Diagnosis not present

## 2015-10-04 DIAGNOSIS — K589 Irritable bowel syndrome without diarrhea: Secondary | ICD-10-CM | POA: Insufficient documentation

## 2015-10-04 DIAGNOSIS — M5412 Radiculopathy, cervical region: Secondary | ICD-10-CM | POA: Diagnosis not present

## 2015-10-04 DIAGNOSIS — M545 Low back pain, unspecified: Secondary | ICD-10-CM

## 2015-10-04 DIAGNOSIS — K219 Gastro-esophageal reflux disease without esophagitis: Secondary | ICD-10-CM | POA: Diagnosis not present

## 2015-10-04 DIAGNOSIS — M4802 Spinal stenosis, cervical region: Secondary | ICD-10-CM | POA: Insufficient documentation

## 2015-10-04 DIAGNOSIS — I10 Essential (primary) hypertension: Secondary | ICD-10-CM | POA: Diagnosis not present

## 2015-10-04 DIAGNOSIS — I493 Ventricular premature depolarization: Secondary | ICD-10-CM | POA: Diagnosis not present

## 2015-10-04 DIAGNOSIS — G609 Hereditary and idiopathic neuropathy, unspecified: Secondary | ICD-10-CM | POA: Insufficient documentation

## 2015-10-04 DIAGNOSIS — M5416 Radiculopathy, lumbar region: Secondary | ICD-10-CM

## 2015-10-04 DIAGNOSIS — M8580 Other specified disorders of bone density and structure, unspecified site: Secondary | ICD-10-CM | POA: Insufficient documentation

## 2015-10-04 DIAGNOSIS — M48061 Spinal stenosis, lumbar region without neurogenic claudication: Secondary | ICD-10-CM

## 2015-10-04 DIAGNOSIS — G8929 Other chronic pain: Secondary | ICD-10-CM | POA: Insufficient documentation

## 2015-10-04 DIAGNOSIS — E039 Hypothyroidism, unspecified: Secondary | ICD-10-CM | POA: Diagnosis not present

## 2015-10-04 MED ORDER — SODIUM CHLORIDE 0.9% FLUSH
2.0000 mL | Freq: Once | INTRAVENOUS | Status: AC
  Administered 2015-10-04: 2 mL

## 2015-10-04 MED ORDER — TRIAMCINOLONE ACETONIDE 40 MG/ML IJ SUSP
40.0000 mg | Freq: Once | INTRAMUSCULAR | Status: AC
  Administered 2015-10-04: 40 mg
  Filled 2015-10-04: qty 1

## 2015-10-04 MED ORDER — LIDOCAINE HCL (PF) 1 % IJ SOLN
10.0000 mL | Freq: Once | INTRAMUSCULAR | Status: AC
  Administered 2015-10-04: 10 mL
  Filled 2015-10-04: qty 10

## 2015-10-04 MED ORDER — ROPIVACAINE HCL 2 MG/ML IJ SOLN
2.0000 mL | Freq: Once | INTRAMUSCULAR | Status: AC
  Administered 2015-10-04: 2 mL via EPIDURAL
  Filled 2015-10-04: qty 10

## 2015-10-04 MED ORDER — IOPAMIDOL (ISOVUE-M 200) INJECTION 41%
10.0000 mL | Freq: Once | INTRAMUSCULAR | Status: AC
  Administered 2015-10-04: 10 mL via EPIDURAL
  Filled 2015-10-04: qty 10

## 2015-10-04 NOTE — Progress Notes (Signed)
Safety precautions to be maintained throughout the outpatient stay will include: orient to surroundings, keep bed in low position, maintain call bell within reach at all times, provide assistance with transfer out of bed and ambulation.  

## 2015-10-04 NOTE — Progress Notes (Signed)
Patient's Name: Cheyenne Pierce  Patient type: Established  MRN: [id number]  Service setting: Ambulatory outpatient  DOB: Dec 28, 1934  Location: [facility]  DOS: 10/04/2015  Primary Care Physician: Ria Bush, MD  Note by: Kathlen Brunswick. [name], M.D, [place], [place], DABPM, Milagros Evener, [place]  Referring Physician: Ria Bush, MD  Specialty: Board-Certified Interventional Pain Management  Last Visit to Pain Management: 10/01/2015   Primary Reason(s) for Visit: Interventional Pain Management Treatment. CC: Back Pain  Primary Diagnosis: Lumbar radiculopathy, chronic [M54.16]   Procedure:  Anesthesia, Analgesia, Anxiolysis:  Type: Therapeutic Inter-Laminar Epidural Steroid Injection Region: Lumbar Level: L4-5 Level. Laterality: Left Paramedial  Indications: 1. Chronic Lumbar Radiculopathy (Left)   2. Chronic Lumbar Radicular pain (Left)   3. Lumbar foraminal stenosis (Bilateral L3-4) (Right L4-5)   4. Lumbar spinal stenosis (L3-4)   5. Chronic low back pain     Pre-procedure Pain Score: 7/10 Reported level of pain is compatible with clinical observations Post-procedure Pain Score: 7   Type: Local Anesthesia Local Anesthetic: Lidocaine 1% Route: Infiltration ([place]/IM) IV Access: Declined Sedation: Declined  Indication(s): Analgesia          Pre-Procedure Assessment:  Ms. Cheyenne Pierce is a 81 y.o. year old, female patient, seen today for interventional treatment. She has Hypothyroidism; GAD (generalized anxiety disorder); Hereditary and idiopathic peripheral neuropathy; Essential hypertension; Reflux esophagitis; GERD; INGUINAL HERNIA, LEFT; Irritable bowel syndrome; OTHER CHRONIC CYSTITIS; Osteopenia; Frontal sinus pain; Systolic murmur; Medicare annual wellness visit, subsequent; PVCs (premature ventricular contractions); Prediabetes; Headache; Dizziness; Mood swings ([place]); Memory deficit; Hair thinning; Advanced care planning/counseling discussion; HLD  (hyperlipidemia); Health maintenance examination; Recurrent falls; Night sweats; Chronic pain; Encounter for therapeutic drug level monitoring; Encounter for chronic pain management; Chronic low back pain; Chronic neck pain (R>L); Cervical spondylosis; Chronic cervical radicular pain (Bilateral) (R>L); Numbness of upper extremity (Bilateral) (R>L); Upper extremity weakness (Bilateral); Neurogenic pain; Neuropathic pain; Inflammatory pain; Chronic Lumbar Radicular pain (Left); Allodynia; Encounter for long-term current use of medication; Lumbar spondylosis; Chronic Lumbar Radiculopathy (Left); Chronic lower extremity pain (Bilateral) (L>R); Chronic hip pain (Right); Abnormal Cervical MRI (01/24/15); Cervical spinal stenosis (C3 through C7); Cervical facet hypertrophy; Cervical foraminal stenosis (C3-4 through C6-7) (R>L); Chronic cervical radiculopathy (Bilateral) (R>L) (pain, numbness, and weakness); Abnormal Lumbar MRI (11/10/2014); Lumbar spinal stenosis (L3-4); Lumbar foraminal stenosis (Bilateral L3-4) (Right L4-5); and Depression with anxiety on her problem list.. Her primarily concern today is the Back Pain   Pain Type: Chronic pain Pain Location: Back Pain Orientation: Lower Pain Descriptors / Indicators: Radiating (numbness in left foot) Pain Frequency: Constant  Date of Last Visit: 05/01/15 Service Provided on Last Visit: Evaluation  Coagulation Parameters Lab Results  Component Value Date   INR 1.0 11/08/2012   LABPROT 13.8 11/08/2012   APTT 32.6 11/08/2012   PLT 286 05/22/2015    Verification of the correct person, correct site (including marking of site), and correct procedure were performed and confirmed by the patient.  Consent: Secured. Under the influence of no sedatives a written informed consent was obtained, after having provided information on the risks and possible complications. To fulfill our ethical and legal obligations, as recommended by the American Medical  Association's Code of Ethics, we have provided information to the patient about our clinical impression; the nature and purpose of the treatment or procedure; the risks, benefits, and possible complications of the intervention; alternatives; the risk(s) and benefit(s) of the alternative treatment(s) or procedure(s); and the risk(s) and benefit(s) of doing nothing. The patient was provided  information about the risks and possible complications associated with the procedure. These include, but are not limited to, failure to achieve desired goals, infection, bleeding, organ or nerve damage, allergic reactions, paralysis, and death. In the case of spinal procedures these may include, but are not limited to, failure to achieve desired goals, infection, bleeding, organ or nerve damage, allergic reactions, paralysis, and death. In addition, the patient was informed that Medicine is not an exact science; therefore, there is also the possibility of unforeseen risks and possible complications that may result in a catastrophic outcome. The patient indicated having understood very clearly. We have given the patient no guarantees and we have made no promises. Enough time was given to the patient to ask questions, all of which were answered to the patient's satisfaction.  Consent Attestation: I, the ordering provider, attest that I have discussed with the patient the benefits, risks, side-effects, alternatives, likelihood of achieving goals, and potential problems during recovery for the procedure that I have provided informed consent.  Pre-Procedure Preparation: Safety Precautions: Allergies reviewed. Appropriate site, procedure, and patient were confirmed by following the Joint Commission's Universal Protocol (UP.01.01.01), in the form of a "Time Out". The patient was asked to confirm marked site and procedure, before commencing. The patient was asked about blood thinners, or active infections, both of which were denied.  Patient was assessed for positional comfort and all pressure points were checked before starting procedure. Allergies: She is allergic to codeine; penicillins; prednisone; zoloft; amoxicillin; codeine phosphate; metronidazole; oxcarbazepine; perindopril erbumine; potassium-containing compounds; prozac; and tramadol hcl.. Infection Control Precautions: Sterile technique used. Standard Universal Precautions were taken as recommended by the Department of [facility] for Disease Control and Prevention (CDC). Standard pre-surgical skin prep was conducted. Respiratory hygiene and cough etiquette was practiced. Hand hygiene observed. Safe injection practices and needle disposal techniques followed. SDV (single dose vial) medications used. Medications properly checked for expiration dates and contaminants. Personal protective equipment (PPE) used: Surgical mask. Sterile Radiation-resistant gloves. Monitoring:  As per clinic protocol. Filed Vitals:   10/04/15 1433 10/04/15 1436 10/04/15 1441 10/04/15 1443  BP: 110/96 150/72 152/117 144/74  Pulse: 69 61 65 63  Resp: '16 14 16 13  ' SpO2: 95% 99% 96% 97%  Calculated BMI: There is no weight on file to calculate BMI.  Description of Procedure Process:  Time-out: "Time-out" completed before starting procedure, as per protocol. Position: Prone Target Area: For Epidural Steroid injections, the target area is the  interlaminar space, initially targeting the lower border of the superior vertebral body lamina. Approach: Posterior approach. Area Prepped: Entire Posterior Lumbosacral Region Prepping solution: ChloraPrep (2% chlorhexidine gluconate and 70% isopropyl alcohol) Safety Precautions: Aspiration looking for blood return was conducted prior to all injections. At no point did we inject any substances, as a needle was being advanced. No attempts were made at seeking any paresthesias. Safe injection practices and needle disposal techniques used.  Medications properly checked for expiration dates. SDV (single dose vial) medications used.         Description of the Procedure: Protocol guidelines were followed. The patient was placed in position over the fluoroscopy table. The target area was identified and the area prepped in the usual manner. Skin desensitized using vapocoolant spray. Skin & deeper tissues infiltrated with local anesthetic. Appropriate amount of time allowed to pass for local anesthetics to take effect. The procedure needle was introduced through the skin, ipsilateral to the reported pain, and advanced to the target area. Bone was contacted and  the needle walked caudad, until the lamina was cleared. The epidural space was identified using "loss-of-resistance technique" with 2-3 ml of PF-NaCl (0.9% NSS), in a 5cc LOR glass syringe. Proper needle placement secured. Negative aspiration confirmed. Solution injected in intermittent fashion, asking for systemic symptoms every 0.5cc of injectate. The needles were then removed and the area cleansed, making sure to leave some of the prepping solution back to take advantage of its long term bactericidal properties. EBL: None Materials & Medications Used:  Needle(s) Used: 20g - 10cm, Tuohy-style epidural needle Medication(s): Please see chart orders for medication and dosing details.  Imaging Guidance:  Type of Imaging Technique: Fluoroscopy Guidance (Spinal) Indication(s): Assistance in needle guidance and placement for procedures requiring needle placement in or near specific anatomical locations not easily accessible without such assistance. Exposure Time: Please see nurses notes. Contrast: Before injecting any contrast, we confirmed that the patient did not have an allergy to iodine, shellfish, or radiological contrast. Once satisfactory needle placement was completed at the desired level, radiological contrast was injected. Injection was conducted under continuous fluoroscopic guidance.  Injection of contrast accomplished without complications. See chart for type and volume of contrast used. Fluoroscopic Guidance: I was personally present in the fluoroscopy suite, where the patient was placed in position for the procedure, over the fluoroscopy-compatible table. Fluoroscopy was manipulated, using "Tunnel Vision Technique", to obtain the best possible view of the target area, on the affected side. Parallax error was corrected before commencing the procedure. A "direction-depth-direction" technique was used to introduce the needle under continuous pulsed fluoroscopic guidance. Once the target was reached, antero-posterior, oblique, and lateral fluoroscopic projection views were taken to confirm needle placement in all planes. Permanently recorded images stored by scanning into EMR. Interpretation: Intraoperative imaging interpretation by performing Physician. Adequate needle placement confirmed in AP & Oblique Views. Appropriate spread of contrast to desired area. No evidence of afferent or efferent intravascular uptake. No intrathecal or subarachnoid spread observed. Permanent images scanned into the patient's record.  Antibiotic Prophylaxis:  Indication(s): No indications identified. Type:  Antibiotics Given (last 72 hours)    None       Post-operative Assessment:   Complications: No immediate post-treatment complications were observed. Relevant Post-operative Information:  Disposition: Return to clinic for follow-up evaluation. The patient tolerated the entire procedure well. A repeat set of vitals were taken after the procedure and the patient was kept under observation following institutional policy, for this procedure. Post-procedural neurological assessment was performed, showing return to baseline, prior to discharge. The patient was discharged home, once institutional criteria were met. The patient was provided with post-procedure discharge instructions, including a section on  how to identify potential problems. Should any problems arise concerning this procedure, the patient was given instructions to immediately contact us, at any time, without hesitation. In any case, we plan to contact the patient by telephone for a follow-up status report regarding this interventional procedure. Comments:  No additional relevant information.  Medications administered during this visit: We administered triamcinolone acetonide, iopamidol, lidocaine (PF), sodium chloride flush, and ropivacaine (PF) 2 mg/ml (0.2%).  Prescriptions ordered during this visit: New Prescriptions   No medications on file    Future Appointments Date Time Provider [place]  10/11/2015 11:15 AM [facility]  10/17/2015 10:30 AM Ria Bush, MD [facility]  11/05/2015 2:00 PM Milinda Pointer, MD [facility] None    Primary Care Physician: Ria Bush, MD Location: [facility] Note by: Kathlen Brunswick. [name], M.D, DABA,  DABAPM, DABPM, DABIPP, FIPP  Disclaimer:  Medicine is not an Chief Strategy Officer. The only guarantee in medicine is that nothing is guaranteed. It is important to note that the decision to proceed with this intervention was based on the information collected from the patient. The Data and conclusions were drawn from the patient's questionnaire, the interview, and the physical examination. Because the information was provided in large part by the patient, it cannot be guaranteed that it has not been purposely or unconsciously manipulated. Every effort has been made to obtain as much relevant data as possible for this evaluation. It is important to note that the conclusions that lead to this procedure are derived in large part from the available data. Always take into account that the treatment will also be dependent on availability of resources and existing treatment guidelines, considered by other Pain Management Practitioners  as being common knowledge and practice, at the time of the intervention. For Medico-Legal purposes, it is also important to point out that variation in procedural techniques and pharmacological choices are the acceptable norm. The indications, contraindications, technique, and results of the above procedure should only be interpreted and judged by a Board-Certified Interventional Pain Specialist with extensive familiarity and expertise in the same exact procedure and technique. Attempts at providing opinions without similar or greater experience and expertise than that of the treating physician will be considered as inappropriate and unethical, and shall result in a formal complaint to the state medical board and applicable specialty societies.

## 2015-10-04 NOTE — Patient Instructions (Signed)

## 2015-10-05 ENCOUNTER — Telehealth: Payer: Self-pay | Admitting: *Deleted

## 2015-10-05 NOTE — Telephone Encounter (Signed)
Message left

## 2015-10-08 DIAGNOSIS — H2513 Age-related nuclear cataract, bilateral: Secondary | ICD-10-CM | POA: Diagnosis not present

## 2015-10-10 ENCOUNTER — Other Ambulatory Visit: Payer: Self-pay | Admitting: Family Medicine

## 2015-10-10 ENCOUNTER — Encounter: Payer: Self-pay | Admitting: *Deleted

## 2015-10-10 DIAGNOSIS — E785 Hyperlipidemia, unspecified: Secondary | ICD-10-CM

## 2015-10-10 DIAGNOSIS — I10 Essential (primary) hypertension: Secondary | ICD-10-CM

## 2015-10-10 DIAGNOSIS — E039 Hypothyroidism, unspecified: Secondary | ICD-10-CM

## 2015-10-10 DIAGNOSIS — G609 Hereditary and idiopathic neuropathy, unspecified: Secondary | ICD-10-CM

## 2015-10-11 ENCOUNTER — Other Ambulatory Visit (INDEPENDENT_AMBULATORY_CARE_PROVIDER_SITE_OTHER): Payer: Medicare Other

## 2015-10-11 DIAGNOSIS — E039 Hypothyroidism, unspecified: Secondary | ICD-10-CM | POA: Diagnosis not present

## 2015-10-11 DIAGNOSIS — I10 Essential (primary) hypertension: Secondary | ICD-10-CM

## 2015-10-11 DIAGNOSIS — E785 Hyperlipidemia, unspecified: Secondary | ICD-10-CM | POA: Diagnosis not present

## 2015-10-11 LAB — BASIC METABOLIC PANEL
BUN: 18 mg/dL (ref 6–23)
CALCIUM: 9.9 mg/dL (ref 8.4–10.5)
CO2: 29 mEq/L (ref 19–32)
CREATININE: 0.79 mg/dL (ref 0.40–1.20)
Chloride: 102 mEq/L (ref 96–112)
GFR: 74.57 mL/min (ref >60.00)
GLUCOSE: 100 mg/dL — AB (ref 70–99)
Potassium: 3.9 mEq/L (ref 3.5–5.1)
Sodium: 138 mEq/L (ref 135–145)

## 2015-10-11 LAB — LIPID PANEL
CHOL/HDL RATIO: 3
CHOLESTEROL: 216 mg/dL — AB (ref 0–200)
HDL: 75.6 mg/dL (ref >39.00)
LDL CALC: 123 mg/dL — AB (ref 0–99)
NonHDL: 140.31
TRIGLYCERIDES: 85 mg/dL (ref 0.0–149.0)
VLDL: 17 mg/dL (ref 0.0–40.0)

## 2015-10-11 LAB — TSH: TSH: 5.67 u[IU]/mL — AB (ref 0.35–4.50)

## 2015-10-12 NOTE — Discharge Instructions (Signed)

## 2015-10-15 ENCOUNTER — Ambulatory Visit
Admission: RE | Admit: 2015-10-15 | Discharge: 2015-10-15 | Disposition: A | Discharge status: Home / Self Care | Payer: Medicare Other | Source: Ambulatory Visit | Attending: Ophthalmology | Admitting: Ophthalmology

## 2015-10-15 ENCOUNTER — Ambulatory Visit: Payer: Medicare Other | Admitting: Anesthesiology

## 2015-10-15 ENCOUNTER — Encounter
Admission: RE | Disposition: A | Discharge status: Home / Self Care | Payer: Self-pay | Source: Ambulatory Visit | Attending: Ophthalmology

## 2015-10-15 DIAGNOSIS — G709 Myoneural disorder, unspecified: Secondary | ICD-10-CM | POA: Diagnosis not present

## 2015-10-15 DIAGNOSIS — Z87891 Personal history of nicotine dependence: Secondary | ICD-10-CM | POA: Insufficient documentation

## 2015-10-15 DIAGNOSIS — H2512 Age-related nuclear cataract, left eye: Secondary | ICD-10-CM | POA: Diagnosis not present

## 2015-10-15 DIAGNOSIS — K219 Gastro-esophageal reflux disease without esophagitis: Secondary | ICD-10-CM | POA: Insufficient documentation

## 2015-10-15 DIAGNOSIS — I10 Essential (primary) hypertension: Secondary | ICD-10-CM | POA: Insufficient documentation

## 2015-10-15 DIAGNOSIS — E039 Hypothyroidism, unspecified: Secondary | ICD-10-CM | POA: Insufficient documentation

## 2015-10-15 DIAGNOSIS — H2513 Age-related nuclear cataract, bilateral: Secondary | ICD-10-CM | POA: Diagnosis not present

## 2015-10-15 DIAGNOSIS — K449 Diaphragmatic hernia without obstruction or gangrene: Secondary | ICD-10-CM | POA: Insufficient documentation

## 2015-10-15 DIAGNOSIS — M5416 Radiculopathy, lumbar region: Secondary | ICD-10-CM

## 2015-10-15 HISTORY — DX: Presence of dental prosthetic device (complete) (partial): Z97.2

## 2015-10-15 HISTORY — PX: CATARACT EXTRACTION W/PHACO: SHX586

## 2015-10-15 HISTORY — PX: EYE SURGERY: SHX253

## 2015-10-15 SURGERY — PHACOEMULSIFICATION, CATARACT, WITH IOL INSERTION
Anesthesia: Monitor Anesthesia Care | Site: Eye | Laterality: Left | Wound class: Clean

## 2015-10-15 MED ORDER — TETRACAINE HCL 0.5 % OP SOLN
1.0000 [drp] | Freq: Once | OPHTHALMIC | Status: AC
  Administered 2015-10-15: 1 [drp] via OPHTHALMIC

## 2015-10-15 MED ORDER — POVIDONE-IODINE 5 % OP SOLN
1.0000 "application " | Freq: Once | OPHTHALMIC | Status: AC
  Administered 2015-10-15: 1 via OPHTHALMIC

## 2015-10-15 MED ORDER — ACETAMINOPHEN 160 MG/5ML PO SOLN: 325.0000 mg | ORAL | Status: DC | PRN

## 2015-10-15 MED ORDER — ONDANSETRON HCL 4 MG/2ML IJ SOLN: 4.0000 mg | Freq: Once | INTRAMUSCULAR | Status: DC | PRN

## 2015-10-15 MED ORDER — TIMOLOL MALEATE 0.5 % OP SOLN
OPHTHALMIC | Status: DC | PRN
  Administered 2015-10-15: 1 [drp] via OPHTHALMIC

## 2015-10-15 MED ORDER — ARMC OPHTHALMIC DILATING GEL
1.0000 "application " | Freq: Once | OPHTHALMIC | Status: AC
  Administered 2015-10-15 (×2): 1 via OPHTHALMIC

## 2015-10-15 MED ORDER — BRIMONIDINE TARTRATE 0.2 % OP SOLN
OPHTHALMIC | Status: DC | PRN
  Administered 2015-10-15: 1 [drp] via OPHTHALMIC

## 2015-10-15 MED ORDER — LIDOCAINE HCL (PF) 4 % IJ SOLN
INTRAOCULAR | Status: DC | PRN
  Administered 2015-10-15: 1 mL via OPHTHALMIC

## 2015-10-15 MED ORDER — ACETAMINOPHEN 325 MG PO TABS: 325.0000 mg | ORAL_TABLET | ORAL | Status: DC | PRN

## 2015-10-15 MED ORDER — NA HYALUR & NA CHOND-NA HYALUR 0.4-0.35 ML IO KIT
PACK | INTRAOCULAR | Status: DC | PRN
  Administered 2015-10-15: 1 mL via INTRAOCULAR

## 2015-10-15 MED ORDER — FENTANYL CITRATE (PF) 100 MCG/2ML IJ SOLN
INTRAMUSCULAR | Status: DC | PRN
  Administered 2015-10-15: 50 ug via INTRAVENOUS

## 2015-10-15 MED ORDER — CEFUROXIME OPHTHALMIC INJECTION 1 MG/0.1 ML
INJECTION | OPHTHALMIC | Status: DC | PRN
  Administered 2015-10-15: 0.1 mL via INTRACAMERAL

## 2015-10-15 MED ORDER — EPINEPHRINE HCL 1 MG/ML IJ SOLN
INTRAMUSCULAR | Status: DC | PRN
  Administered 2015-10-15: 90 mL via OPHTHALMIC

## 2015-10-15 MED ORDER — MIDAZOLAM HCL 2 MG/2ML IJ SOLN
INTRAMUSCULAR | Status: DC | PRN
  Administered 2015-10-15: 2 mg via INTRAVENOUS

## 2015-10-15 SURGICAL SUPPLY — 31 items
APL FBRTP 3 NS LF CTTN WD (MISCELLANEOUS) ×1
APPLICATOR COTTON TIP 3IN (MISCELLANEOUS) ×2 IMPLANT
CANNULA ANT/CHMB 27G (MISCELLANEOUS) ×1 IMPLANT
CANNULA ANT/CHMB 27GA (MISCELLANEOUS) ×2 IMPLANT
DISSECTOR HYDRO NUCLEUS 50X22 (MISCELLANEOUS) ×2 IMPLANT
GLOVE BIO SURGEON STRL SZ7 (GLOVE) ×2 IMPLANT
GLOVE SURG LX 6.5 MICRO (GLOVE) ×1
GLOVE SURG LX STRL 6.5 MICRO (GLOVE) ×1 IMPLANT
GOWN STRL REUS W/ TWL LRG LVL3 (GOWN DISPOSABLE) ×2 IMPLANT
GOWN STRL REUS W/TWL LRG LVL3 (GOWN DISPOSABLE) ×4
LENS IOL ACRYSOF IQ 19.5 (Intraocular Lens) ×1 IMPLANT
MARKER SKIN DUAL TIP RULER LAB (MISCELLANEOUS) ×2 IMPLANT
NDL FILTER BLUNT 18X1 1/2 (NEEDLE) ×1 IMPLANT
NEEDLE FILTER BLUNT 18X 1/2SAF (NEEDLE) ×1
NEEDLE FILTER BLUNT 18X1 1/2 (NEEDLE) ×1 IMPLANT
PACK CATARACT BRASINGTON (MISCELLANEOUS) ×2 IMPLANT
PACK EYE AFTER SURG (MISCELLANEOUS) ×2 IMPLANT
PACK OPTHALMIC (MISCELLANEOUS) ×2 IMPLANT
RING MALYGIN 7.0 (MISCELLANEOUS) IMPLANT
SOL BAL SALT 15ML (MISCELLANEOUS)
SOLUTION BAL SALT 15ML (MISCELLANEOUS) IMPLANT
SUT ETHILON 10-0 CS-B-6CS-B-6 (SUTURE)
SUT VICRYL  9 0 (SUTURE)
SUT VICRYL 9 0 (SUTURE) IMPLANT
SUTURE EHLN 10-0 CS-B-6CS-B-6 (SUTURE) IMPLANT
SYR 3ML LL SCALE MARK (SYRINGE) ×2 IMPLANT
SYR TB 1ML LUER SLIP (SYRINGE) ×2 IMPLANT
WATER STERILE IRR 250ML POUR (IV SOLUTION) ×2 IMPLANT
WATER STERILE IRR 500ML POUR (IV SOLUTION) IMPLANT
WICK EYE OCUCEL (MISCELLANEOUS) IMPLANT
WIPE NON LINTING 3.25X3.25 (MISCELLANEOUS) ×2 IMPLANT

## 2015-10-15 NOTE — Anesthesia Preprocedure Evaluation (Signed)
Anesthesia Evaluation  Patient identified by MRN, date of birth, ID band Patient awake    Reviewed: Allergy & Precautions, H&P , NPO status   Airway Mallampati: II  TM Distance: >3 FB Neck ROM: full    Dental   Pulmonary former smoker,           Cardiovascular hypertension, + angina      Neuro/Psych  Headaches, PSYCHIATRIC DISORDERS  Neuromuscular disease    GI/Hepatic hiatal hernia, GERD  ,  Endo/Other  Hypothyroidism   Renal/GU      Musculoskeletal   Abdominal   Peds  Hematology   Anesthesia Other Findings   Reproductive/Obstetrics                             Anesthesia Physical Anesthesia Plan  ASA: II  Anesthesia Plan: MAC   Post-op Pain Management:    Induction:   Airway Management Planned:   Additional Equipment:   Intra-op Plan:   Post-operative Plan:   Informed Consent: I have reviewed the patients History and Physical, chart, labs and discussed the procedure including the risks, benefits and alternatives for the proposed anesthesia with the patient or authorized representative who has indicated his/her understanding and acceptance.     Plan Discussed with: CRNA  Anesthesia Plan Comments:         Anesthesia Quick Evaluation

## 2015-10-15 NOTE — H&P (Signed)
H+P reviewed and is up to date, please see paper chart.  

## 2015-10-15 NOTE — Op Note (Signed)
Date of Surgery: 10/15/2015  PREOPERATIVE DIAGNOSES: Visually significant nuclear sclerotic cataract, left eye.  POSTOPERATIVE DIAGNOSES: Same  PROCEDURES PERFORMED: Cataract extraction with intraocular lens implant, left eye.  SURGEON: Almon Hercules, M.D.  ANESTHESIA: MAC and topical  IMPLANTS: [id number] +19.5 D   Implant Name Type Inv. Item Serial No. Manufacturer Lot No. LRB No. Used  LENS IOL ACRYSOF IQ 19.5 - [id number] Intraocular Lens LENS IOL ACRYSOF IQ 19.5 [id number] ALCON   Left 1    COMPLICATIONS: None.  DESCRIPTION OF PROCEDURE: Therapeutic options were discussed with the patient preoperatively, including a discussion of risks and benefits of surgery. Informed consent was obtained. An IOL-Master and immersion biometry were used to take the lens measurements, and a dilated fundus exam was performed within 6 months of the surgical date.  The patient was premedicated and brought to the operating room and placed on the operating table in the supine position. After adequate anesthesia, the patient was prepped and draped in the usual sterile ophthalmic fashion. A wire lid speculum was inserted and the microscope was positioned. A Superblade was used to create a paracentesis site at the limbus and a small amount of dilute preservative free lidocaine was instilled into the anterior chamber, followed by dispersive viscoelastic. A clear corneal incision was created temporally using a 2.4 mm keratome blade. Capsulorrhexis was then performed. In situ phacoemulsification was performed.  Cortical material was removed with the irrigation-aspiration unit. Dispersive viscoelastic was instilled to open the capsular bag. A posterior chamber intraocular lens with the specifications above was inserted and positioned. Irrigation-aspiration was used to remove all viscoelastic. Cefuroxime 1cc was instilled into the anterior chamber, and the corneal incision was checked and found to be water tight. The  eyelid speculum was removed.  The operative eye was covered with protective goggles after instilling 1 drop of timolol and brimonidine. The patient tolerated the procedure well. There were no complications.

## 2015-10-15 NOTE — Anesthesia Postprocedure Evaluation (Signed)
Anesthesia Post Note  Patient: Cheyenne Pierce  Procedure(s) Performed: Procedure(s) (LRB): CATARACT EXTRACTION PHACO AND INTRAOCULAR LENS PLACEMENT (IOC) LEFT EYE (Left)  Patient location during evaluation: PACU Anesthesia Type: MAC Level of consciousness: awake and alert Pain management: pain level controlled Vital Signs Assessment: post-procedure vital signs reviewed and stable Respiratory status: spontaneous breathing, nonlabored ventilation, respiratory function stable and patient connected to nasal cannula oxygen Cardiovascular status: stable and blood pressure returned to baseline Anesthetic complications: no    Amaryllis Dyke

## 2015-10-15 NOTE — Anesthesia Procedure Notes (Signed)
Procedure Name: MAC Performed by: Kesha Hurrell Pre-anesthesia Checklist: Patient identified, Emergency Drugs available, Suction available, Timeout performed and Patient being monitored Patient Re-evaluated:Patient Re-evaluated prior to inductionOxygen Delivery Method: Nasal cannula Placement Confirmation: positive ETCO2     

## 2015-10-15 NOTE — Transfer of Care (Signed)
Immediate Anesthesia Transfer of Care Note  Patient: Cheyenne Pierce  Procedure(s) Performed: Procedure(s) with comments: CATARACT EXTRACTION PHACO AND INTRAOCULAR LENS PLACEMENT (IOC) LEFT EYE (Left) - LEFT LEAVE PT LAST  Patient Location: PACU  Anesthesia Type: MAC  Level of Consciousness: awake, alert  and patient cooperative  Airway and Oxygen Therapy: Patient Spontanous Breathing and Patient connected to supplemental oxygen  Post-op Assessment: Post-op Vital signs reviewed, Patient's Cardiovascular Status Stable, Respiratory Function Stable, Patent Airway and No signs of Nausea or vomiting  Post-op Vital Signs: Reviewed and stable  Complications: No apparent anesthesia complications

## 2015-10-16 ENCOUNTER — Encounter: Payer: Self-pay | Admitting: Ophthalmology

## 2015-10-17 ENCOUNTER — Ambulatory Visit (INDEPENDENT_AMBULATORY_CARE_PROVIDER_SITE_OTHER): Payer: Medicare Other | Admitting: Family Medicine

## 2015-10-17 ENCOUNTER — Encounter: Payer: Self-pay | Admitting: Family Medicine

## 2015-10-17 VITALS — BP 128/72 | HR 65 | Temp 97.9°F | Ht 59.25 in | Wt 128.4 lb

## 2015-10-17 DIAGNOSIS — E785 Hyperlipidemia, unspecified: Secondary | ICD-10-CM | POA: Diagnosis not present

## 2015-10-17 DIAGNOSIS — E039 Hypothyroidism, unspecified: Secondary | ICD-10-CM

## 2015-10-17 DIAGNOSIS — K589 Irritable bowel syndrome without diarrhea: Secondary | ICD-10-CM

## 2015-10-17 DIAGNOSIS — Z7189 Other specified counseling: Secondary | ICD-10-CM | POA: Diagnosis not present

## 2015-10-17 DIAGNOSIS — I10 Essential (primary) hypertension: Secondary | ICD-10-CM

## 2015-10-17 DIAGNOSIS — G894 Chronic pain syndrome: Secondary | ICD-10-CM

## 2015-10-17 DIAGNOSIS — Z0000 Encounter for general adult medical examination without abnormal findings: Secondary | ICD-10-CM

## 2015-10-17 DIAGNOSIS — F331 Major depressive disorder, recurrent, moderate: Secondary | ICD-10-CM

## 2015-10-17 DIAGNOSIS — M47812 Spondylosis without myelopathy or radiculopathy, cervical region: Secondary | ICD-10-CM

## 2015-10-17 DIAGNOSIS — M8580 Other specified disorders of bone density and structure, unspecified site: Secondary | ICD-10-CM | POA: Diagnosis not present

## 2015-10-17 DIAGNOSIS — K219 Gastro-esophageal reflux disease without esophagitis: Secondary | ICD-10-CM

## 2015-10-17 MED ORDER — MECLIZINE HCL 25 MG PO TABS: ORAL_TABLET | ORAL | Status: AC

## 2015-10-17 MED ORDER — GABAPENTIN 300 MG PO CAPS: 300.0000 mg | ORAL_CAPSULE | Freq: Every day | ORAL | Status: DC

## 2015-10-17 MED ORDER — LEVOTHYROXINE SODIUM 50 MCG PO TABS: ORAL_TABLET | ORAL | Status: DC

## 2015-10-17 MED ORDER — HYOSCYAMINE SULFATE 0.125 MG PO TABS: 0.1250 mg | ORAL_TABLET | Freq: Two times a day (BID) | ORAL | Status: DC | PRN

## 2015-10-17 MED ORDER — PANTOPRAZOLE SODIUM 40 MG PO TBEC: 40.0000 mg | DELAYED_RELEASE_TABLET | Freq: Every day | ORAL | Status: DC

## 2015-10-17 MED ORDER — BUPROPION HCL 75 MG PO TABS: 75.0000 mg | ORAL_TABLET | Freq: Every day | ORAL | Status: DC

## 2015-10-17 NOTE — Progress Notes (Signed)
BP 128/72 mmHg  Pulse 65  Temp(Src) 97.9 F (36.6 C)  Ht 4' 11.25" (1.505 m)  Wt 128 lb 6.4 oz (58.242 kg)  BMI 25.71 kg/m2  SpO2 93%   CC: medicare wellness visit  Subjective:    Patient ID: Cheyenne Pierce, female    DOB: 07-07-1934, 81 y.o.   MRN: [id number]  HPI: Cheyenne Pierce is a 81 y.o. female presenting on 10/17/2015 for Medicare Wellness; Medication Refill; and Night Sweats   bp very low today! Pt feels well. Denies dizziness or lightheadedness or headaches. Improved on retesting BP Readings from Last 3 Encounters:  10/17/15 128/72  10/15/15 112/66  10/04/15 144/74     Chronic pain - sees Dr [name]. Last epidural shot 10/04/2015 was very helpful. She did have ACDF 05/2015 by Dr Trenton Gammon, has recovered well (prolonged recovery). Noted marked worsening of mobility due to general weakness especially lower legs. Significant trouble getting up from seated position.   Took herself off wellbutrin - wonders if needs to restart because of ongoing depressive symptoms.   Lyrica was started for itching around spine surgery. Asks about transitioning from lyrica to hydroxyzine (very expensive). Prior on high dose gabapentin (600mg  up to QID).   Hearing screen passed today.  Gets yearly eye exams by Dr. Joan Mayans at [facility] on Monday L eye.  1 mechanical fall in last year. No injury. Does not use ambulatory assistive device.  PHQ9 = 11 today - will restart wellbutrin.  Preventative: COLONOSCOPY Date: 06/2014 int hem, rpt 5 yrs [facility]) Well woman with OBGYN - 2016 [facility] ([place]). Breast exam with OBGYN. Mammogram - 07/2013 normal diagnostic mammo Birads1 at [facility], has decided to stop mammograms. She does do self breast exams at home Dexa - 11/2011 osteopenia T-1.9 hip  Flu - yearly Pneumovax 2006. prevnar 2015 Shingles shot 07/2011  Td 2007  Advanced directives: scanned and in chart. HCPOA is daughter then sons. Does not want prolonged  measures. Seat belt use discussed Sunscreen use discussed, no changing moles on skin.   Daily caffeine use 6 cups every day  Lives with husband  Grown children nearby  Activity: no regular exercise  Diet: good water, fruits daily, some vegetables   Relevant past medical, surgical, family and social history reviewed and updated as indicated. Interim medical history since our last visit reviewed. Allergies and medications reviewed and updated. Current Outpatient Prescriptions on File Prior to Visit  Medication Sig  . amLODipine (NORVASC) 2.5 MG tablet Take 1 tablet (2.5 mg total) by mouth daily.  Marland Kitchen aspirin 81 MG tablet Take 81 mg by mouth. Twice a week  . Biotin 1000 MCG tablet Take 1,000 mcg by mouth daily.  . diphenhydrAMINE (BENADRYL) 25 mg capsule Take 25 mg by mouth at bedtime.   . Lactobacillus Rhamnosus, GG, (CULTURELLE PO) Take 1 capsule by mouth daily.  . Magnesium 250 MG TABS Take by mouth daily.  . Multiple Vitamins-Minerals (CENTRUM SILVER PO) Take by mouth.    . nitroGLYCERIN (NITROSTAT) 0.4 MG SL tablet Place 0.4 mg under the tongue every 5 (five) minutes as needed.  . Potassium 99 MG TABS Take 2 tablets by mouth daily.  . pregabalin (LYRICA) 75 MG capsule Take 75 mg by mouth 3 (three) times daily.  Marland Kitchen triamterene-hydrochlorothiazide (MAXZIDE-25) 37.5-25 MG tablet take 1 tablet by mouth once daily   No current facility-administered medications on file prior to visit.    Review of Systems Per HPI unless specifically indicated in  ROS section     Objective:    BP 128/72 mmHg  Pulse 65  Temp(Src) 97.9 F (36.6 C)  Ht 4' 11.25" (1.505 m)  Wt 128 lb 6.4 oz (58.242 kg)  BMI 25.71 kg/m2  SpO2 93%  Wt Readings from Last 3 Encounters:  10/17/15 128 lb 6.4 oz (58.242 kg)  10/15/15 127 lb (57.607 kg)  06/05/15 125 lb (56.7 kg)    Physical Exam  Constitutional: She is oriented to person, place, and time. She appears well-developed and well-nourished. No distress.   HENT:  Head: Normocephalic and atraumatic.  Right Ear: Hearing, tympanic membrane, external ear and ear canal normal.  Left Ear: Hearing, tympanic membrane, external ear and ear canal normal.  Nose: Nose normal.  Mouth/Throat: Uvula is midline, oropharynx is clear and moist and mucous membranes are normal. No oropharyngeal exudate, posterior oropharyngeal edema or posterior oropharyngeal erythema.  Eyes: Conjunctivae and EOM are normal. Pupils are equal, round, and reactive to light. No scleral icterus.  Neck: Normal range of motion. Neck supple. Carotid bruit is not present. No thyromegaly present.  Cardiovascular: Normal rate, regular rhythm and intact distal pulses.   Murmur (2/6 SEM RUSB) heard. Pulses:      Radial pulses are 2+ on the right side, and 2+ on the left side.  Pulmonary/Chest: Effort normal and breath sounds normal. No respiratory distress. She has no wheezes. She has no rales.  Abdominal: Soft. Bowel sounds are normal. She exhibits no distension and no mass. There is no tenderness. There is no rebound and no guarding.  Musculoskeletal: Normal range of motion. She exhibits no edema.  Lymphadenopathy:    She has no cervical adenopathy.  Neurological: She is alert and oriented to person, place, and time.  CN grossly intact, station and gait intact Recall 3/3  Calculation 5/5 serial 3s 4/5 strength BLE, weakness noted with getting on exam table. Walks with walker  Skin: Skin is warm and dry. No rash noted.  Psychiatric: She has a normal mood and affect. Her behavior is normal. Judgment and thought content normal.  Nursing note and vitals reviewed.  Results for orders placed or performed in visit on 10/11/15  Lipid panel  Result Value Ref Range   Cholesterol 216 (H) 0 - 200 mg/dL   Triglycerides 85.0 0.0 - 149.0 mg/dL   HDL 75.60 >39.00 mg/dL   VLDL 17.0 0.0 - 40.0 mg/dL   LDL Cholesterol 123 (H) 0 - 99 mg/dL   Total CHOL/HDL Ratio 3    NonHDL [id number]   Basic  metabolic panel  Result Value Ref Range   Sodium 138 135 - 145 mEq/L   Potassium 3.9 3.5 - 5.1 mEq/L   Chloride 102 96 - 112 mEq/L   CO2 29 19 - 32 mEq/L   Glucose, Bld 100 (H) 70 - 99 mg/dL   BUN 18 6 - 23 mg/dL   Creatinine, Ser 0.79 0.40 - 1.20 mg/dL   Calcium 9.9 8.4 - 10.5 mg/dL   GFR 74.57 >60.00 mL/min  TSH  Result Value Ref Range   TSH 5.67 (H) 0.35 - 4.50 uIU/mL      Assessment & Plan:   Problem List Items Addressed This Visit    Cervical spondylosis (Chronic)   Hypothyroidism    TSH mildly elevated - however denies significant hypothyroid symptoms. No changes today, continue to monitor. Discussed reasons to increase dose.       Relevant Medications   levothyroxine (SYNTHROID, LEVOTHROID) 50 MCG tablet   Essential  hypertension    Chronic, stable. Continue low dose amlodipine + maxzide.      GERD    Continue PPI daily.      Relevant Medications   pantoprazole (PROTONIX) 40 MG tablet   hyoscyamine (LEVSIN, ANASPAZ) 0.125 MG tablet   meclizine (ANTIVERT) 25 MG tablet   Irritable bowel syndrome    Refilled hyoscyamine PRN      Relevant Medications   pantoprazole (PROTONIX) 40 MG tablet   hyoscyamine (LEVSIN, ANASPAZ) 0.125 MG tablet   meclizine (ANTIVERT) 25 MG tablet   Osteopenia    Consider updated dexa.       Medicare annual wellness visit, subsequent - Primary    I have personally reviewed the Medicare Annual Wellness questionnaire and have noted 1. The patient's medical and social history 2. Their use of alcohol, tobacco or illicit drugs 3. Their current medications and supplements 4. The patient's functional ability including ADL's, fall risks, home safety risks and hearing or visual impairment. Cognitive function has been assessed and addressed as indicated.  5. Diet and physical activity 6. Evidence for depression or mood disorders The patients weight, height, BMI have been recorded in the chart. I have made referrals, counseling and provided  education to the patient based on review of the above and I have provided the pt with a written personalized care plan for preventive services. Provider list updated.. See scanned questionairre as needed for further documentation. Reviewed preventative protocols and updated unless pt declined.       Advanced care planning/counseling discussion    Advanced directives: scanned and in chart. HCPOA is daughter then sons. Does not want prolonged measures.      HLD (hyperlipidemia)    Chronic. Off medication. Recommended low cholesterol diet.       Chronic pain syndrome    Due to severe osteoarthritis with spinal and foraminal stenosis with radiculopathy, followed by Dr [name] and Dr Trenton Gammon. Most recently completed ACDF and epidural steroid injection with some improvement in pain. Lyrica too expensive, desires retrial of gabapentin (previously on high dose). Will start 300mg  nightly.       MDD (major depressive disorder), recurrent episode, moderate ([place])    Self weaned off wellbutrin, but now interested in restarting. Will restart wellbutrin 75mg  daily       Relevant Medications   buPROPion (WELLBUTRIN) 75 MG tablet       Follow up plan: Return in about 6 months (around 04/18/2016), or as needed, for follow up visit.  Ria Bush, MD

## 2015-10-17 NOTE — Progress Notes (Signed)
Pre visit review using our clinic review tool, if applicable. No additional management support is needed unless otherwise documented below in the visit note. 

## 2015-10-17 NOTE — Assessment & Plan Note (Signed)

## 2015-10-17 NOTE — Patient Instructions (Addendum)
Trial gabapentin '300mg'$  nightly in place of lyrica - update [place] with effect.  Medicines refilled today. Restart wellbutrin '75mg'$  once daily.  Return as needed or in 6 months for follow up visit.  Health Maintenance, Female Adopting a healthy lifestyle and getting preventive care can go a long way to promote health and wellness. Talk with your health care provider about what schedule of regular examinations is right for you. This is a good chance for you to check in with your provider about disease prevention and staying healthy. In between checkups, there are plenty of things you can do on your own. Experts have done a lot of research about which lifestyle changes and preventive measures are most likely to keep you healthy. Ask your health care provider for more information. WEIGHT AND DIET  Eat a healthy diet  Be sure to include plenty of vegetables, fruits, low-fat dairy products, and lean protein.  Do not eat a lot of foods high in solid fats, added sugars, or salt.  Get regular exercise. This is one of the most important things you can do for your health.  Most adults should exercise for at least 150 minutes each week. The exercise should increase your heart rate and make you sweat (moderate-intensity exercise).  Most adults should also do strengthening exercises at least twice a week. This is in addition to the moderate-intensity exercise.  Maintain a healthy weight  Body mass index (BMI) is a measurement that can be used to identify possible weight problems. It estimates body fat based on height and weight. Your health care provider can help determine your BMI and help you achieve or maintain a healthy weight.  For females 39 years of age and older:   A BMI below 18.5 is considered underweight.  A BMI of 18.5 to 24.9 is normal.  A BMI of 25 to 29.9 is considered overweight.  A BMI of 30 and above is considered obese.  Watch levels of cholesterol and blood lipids  You should  start having your blood tested for lipids and cholesterol at 79 years of age, then have this test every 5 years.  You may need to have your cholesterol levels checked more often if:  Your lipid or cholesterol levels are high.  You are older than 79 years of age.  You are at high risk for heart disease.  CANCER SCREENING   Lung Cancer  Lung cancer screening is recommended for adults 68-22 years old who are at high risk for lung cancer because of a history of smoking.  A yearly low-dose CT scan of the lungs is recommended for people who:  Currently smoke.  Have quit within the past 15 years.  Have at least a 30-pack-year history of smoking. A pack year is smoking an average of one pack of cigarettes a day for 1 year.  Yearly screening should continue until it has been 15 years since you quit.  Yearly screening should stop if you develop a health problem that would prevent you from having lung cancer treatment.  Breast Cancer  Practice breast self-awareness. This means understanding how your breasts normally appear and feel.  It also means doing regular breast self-exams. Let your health care provider know about any changes, no matter how small.  If you are in your 20s or 30s, you should have a clinical breast exam (CBE) by a health care provider every 1-3 years as part of a regular health exam.  If you are 40 or older, have  a CBE every year. Also consider having a breast X-ray (mammogram) every year.  If you have a family history of breast cancer, talk to your health care provider about genetic screening.  If you are at high risk for breast cancer, talk to your health care provider about having an MRI and a mammogram every year.  Breast cancer gene (BRCA) assessment is recommended for women who have family members with BRCA-related cancers. BRCA-related cancers include:  Breast.  Ovarian.  Tubal.  Peritoneal cancers.  Results of the assessment will determine the  need for genetic counseling and BRCA1 and BRCA2 testing. Cervical Cancer Your health care provider may recommend that you be screened regularly for cancer of the pelvic organs (ovaries, uterus, and vagina). This screening involves a pelvic examination, including checking for microscopic changes to the surface of your cervix (Pap test). You may be encouraged to have this screening done every 3 years, beginning at age 63.  For women ages 2-65, health care providers may recommend pelvic exams and Pap testing every 3 years, or they may recommend the Pap and pelvic exam, combined with testing for human papilloma virus (HPV), every 5 years. Some types of HPV increase your risk of cervical cancer. Testing for HPV may also be done on women of any age with unclear Pap test results.  Other health care providers may not recommend any screening for nonpregnant women who are considered low risk for pelvic cancer and who do not have symptoms. Ask your health care provider if a screening pelvic exam is right for you.  If you have had past treatment for cervical cancer or a condition that could lead to cancer, you need Pap tests and screening for cancer for at least 20 years after your treatment. If Pap tests have been discontinued, your risk factors (such as having a new sexual partner) need to be reassessed to determine if screening should resume. Some women have medical problems that increase the chance of getting cervical cancer. In these cases, your health care provider may recommend more frequent screening and Pap tests. Colorectal Cancer  This type of cancer can be detected and often prevented.  Routine colorectal cancer screening usually begins at 79 years of age and continues through 79 years of age.  Your health care provider may recommend screening at an earlier age if you have risk factors for colon cancer.  Your health care provider may also recommend using home test kits to check for hidden blood in  the stool.  A small camera at the end of a tube can be used to examine your colon directly (sigmoidoscopy or colonoscopy). This is done to check for the earliest forms of colorectal cancer.  Routine screening usually begins at age 14.  Direct examination of the colon should be repeated every 5-10 years through 79 years of age. However, you may need to be screened more often if early forms of precancerous polyps or small growths are found. Skin Cancer  Check your skin from head to toe regularly.  Tell your health care provider about any new moles or changes in moles, especially if there is a change in a mole's shape or color.  Also tell your health care provider if you have a mole that is larger than the size of a pencil eraser.  Always use sunscreen. Apply sunscreen liberally and repeatedly throughout the day.  Protect yourself by wearing long sleeves, pants, a wide-brimmed hat, and sunglasses whenever you are outside. HEART DISEASE, DIABETES, AND HIGH  BLOOD PRESSURE   High blood pressure causes heart disease and increases the risk of stroke. High blood pressure is more likely to develop in:  People who have blood pressure in the high end of the normal range (130-139/85-89 mm Hg).  People who are overweight or obese.  People who are African American.  If you are 64-51 years of age, have your blood pressure checked every 3-5 years. If you are 78 years of age or older, have your blood pressure checked every year. You should have your blood pressure measured twice--once when you are at a hospital or clinic, and once when you are not at a hospital or clinic. Record the average of the two measurements. To check your blood pressure when you are not at a hospital or clinic, you can use:  An automated blood pressure machine at a pharmacy.  A home blood pressure monitor.  If you are between 68 years and 38 years old, ask your health care provider if you should take aspirin to prevent  strokes.  Have regular diabetes screenings. This involves taking a blood sample to check your fasting blood sugar level.  If you are at a normal weight and have a low risk for diabetes, have this test once every three years after 79 years of age.  If you are overweight and have a high risk for diabetes, consider being tested at a younger age or more often. PREVENTING INFECTION  Hepatitis B  If you have a higher risk for hepatitis B, you should be screened for this virus. You are considered at high risk for hepatitis B if:  You were born in a country where hepatitis B is common. Ask your health care provider which countries are considered high risk.  Your parents were born in a high-risk country, and you have not been immunized against hepatitis B (hepatitis B vaccine).  You have HIV or AIDS.  You use needles to inject street drugs.  You live with someone who has hepatitis B.  You have had sex with someone who has hepatitis B.  You get hemodialysis treatment.  You take certain medicines for conditions, including cancer, organ transplantation, and autoimmune conditions. Hepatitis C  Blood testing is recommended for:  Everyone born from [birth year] through 1963.  Anyone with known risk factors for hepatitis C. Sexually transmitted infections (STIs)  You should be screened for sexually transmitted infections (STIs) including gonorrhea and chlamydia if:  You are sexually active and are younger than 79 years of age.  You are older than 79 years of age and your health care provider tells you that you are at risk for this type of infection.  Your sexual activity has changed since you were last screened and you are at an increased risk for chlamydia or gonorrhea. Ask your health care provider if you are at risk.  If you do not have HIV, but are at risk, it may be recommended that you take a prescription medicine daily to prevent HIV infection. This is called pre-exposure prophylaxis  (PrEP). You are considered at risk if:  You are sexually active and do not regularly use condoms or know the HIV status of your partner(s).  You take drugs by injection.  You are sexually active with a partner who has HIV. Talk with your health care provider about whether you are at high risk of being infected with HIV. If you choose to begin PrEP, you should first be tested for HIV. You should then be tested every  3 months for as long as you are taking PrEP.  PREGNANCY   If you are premenopausal and you may become pregnant, ask your health care provider about preconception counseling.  If you may become pregnant, take 400 to 800 micrograms (mcg) of folic acid every day.  If you want to prevent pregnancy, talk to your health care provider about birth control (contraception). OSTEOPOROSIS AND MENOPAUSE   Osteoporosis is a disease in which the bones lose minerals and strength with aging. This can result in serious bone fractures. Your risk for osteoporosis can be identified using a bone density scan.  If you are 80 years of age or older, or if you are at risk for osteoporosis and fractures, ask your health care provider if you should be screened.  Ask your health care provider whether you should take a calcium or vitamin D supplement to lower your risk for osteoporosis.  Menopause may have certain physical symptoms and risks.  Hormone replacement therapy may reduce some of these symptoms and risks. Talk to your health care provider about whether hormone replacement therapy is right for you.  HOME CARE INSTRUCTIONS   Schedule regular health, dental, and eye exams.  Stay current with your immunizations.   Do not use any tobacco products including cigarettes, chewing tobacco, or electronic cigarettes.  If you are pregnant, do not drink alcohol.  If you are breastfeeding, limit how much and how often you drink alcohol.  Limit alcohol intake to no more than 1 drink per day for  nonpregnant women. One drink equals 12 ounces of beer, 5 ounces of wine, or 1 ounces of hard liquor.  Do not use street drugs.  Do not share needles.  Ask your health care provider for help if you need support or information about quitting drugs.  Tell your health care provider if you often feel depressed.  Tell your health care provider if you have ever been abused or do not feel safe at home.   This information is not intended to replace advice given to you by your health care provider. Make sure you discuss any questions you have with your health care provider.   Document Released: 10/07/2010 Document Revised: 04/14/2014 Document Reviewed: 02/23/2013 Elsevier Interactive Patient Education [employer].

## 2015-10-17 NOTE — Assessment & Plan Note (Signed)
Advanced directives: scanned and in chart. HCPOA is daughter then sons. Does not want prolonged measures. 

## 2015-10-18 NOTE — Assessment & Plan Note (Signed)
Refilled hyoscyamine PRN

## 2015-10-18 NOTE — Assessment & Plan Note (Addendum)
Chronic. Off medication. Recommended low cholesterol diet.

## 2015-10-18 NOTE — Assessment & Plan Note (Signed)
Continue PPI daily. 

## 2015-10-18 NOTE — Assessment & Plan Note (Addendum)
Due to severe osteoarthritis with spinal and foraminal stenosis with radiculopathy, followed by Dr [name] and Dr Trenton Gammon. Most recently completed ACDF and epidural steroid injection with some improvement in pain. Lyrica too expensive, desires retrial of gabapentin (previously on high dose). Will start 300mg  nightly.

## 2015-10-18 NOTE — Assessment & Plan Note (Signed)
Self weaned off wellbutrin, but now interested in restarting. Will restart wellbutrin 75mg  daily

## 2015-10-18 NOTE — Assessment & Plan Note (Signed)
Chronic, stable. Continue low dose amlodipine + maxzide.

## 2015-10-18 NOTE — Assessment & Plan Note (Signed)
Consider updated dexa.

## 2015-10-18 NOTE — Assessment & Plan Note (Signed)
TSH mildly elevated - however denies significant hypothyroid symptoms. No changes today, continue to monitor. Discussed reasons to increase dose.

## 2015-10-22 ENCOUNTER — Encounter: Payer: Medicare Other | Admitting: Pain Medicine

## 2015-11-05 ENCOUNTER — Ambulatory Visit: Payer: Medicare Other | Admitting: Pain Medicine

## 2015-11-13 ENCOUNTER — Ambulatory Visit: Payer: Medicare Other | Attending: Pain Medicine | Admitting: Pain Medicine

## 2015-11-13 ENCOUNTER — Encounter: Payer: Self-pay | Admitting: Pain Medicine

## 2015-11-13 VITALS — BP 149/74 | HR 66 | Temp 98.4°F | Resp 16 | Ht 60.0 in | Wt 125.0 lb

## 2015-11-13 DIAGNOSIS — G8929 Other chronic pain: Secondary | ICD-10-CM | POA: Diagnosis not present

## 2015-11-13 DIAGNOSIS — Z87891 Personal history of nicotine dependence: Secondary | ICD-10-CM | POA: Diagnosis not present

## 2015-11-13 DIAGNOSIS — M25551 Pain in right hip: Secondary | ICD-10-CM | POA: Insufficient documentation

## 2015-11-13 DIAGNOSIS — M79604 Pain in right leg: Secondary | ICD-10-CM | POA: Diagnosis present

## 2015-11-13 DIAGNOSIS — R011 Cardiac murmur, unspecified: Secondary | ICD-10-CM | POA: Diagnosis not present

## 2015-11-13 DIAGNOSIS — G609 Hereditary and idiopathic neuropathy, unspecified: Secondary | ICD-10-CM | POA: Diagnosis not present

## 2015-11-13 DIAGNOSIS — M542 Cervicalgia: Secondary | ICD-10-CM | POA: Insufficient documentation

## 2015-11-13 DIAGNOSIS — K219 Gastro-esophageal reflux disease without esophagitis: Secondary | ICD-10-CM | POA: Diagnosis not present

## 2015-11-13 DIAGNOSIS — M8580 Other specified disorders of bone density and structure, unspecified site: Secondary | ICD-10-CM | POA: Insufficient documentation

## 2015-11-13 DIAGNOSIS — K449 Diaphragmatic hernia without obstruction or gangrene: Secondary | ICD-10-CM | POA: Insufficient documentation

## 2015-11-13 DIAGNOSIS — I10 Essential (primary) hypertension: Secondary | ICD-10-CM | POA: Diagnosis not present

## 2015-11-13 DIAGNOSIS — K589 Irritable bowel syndrome without diarrhea: Secondary | ICD-10-CM | POA: Insufficient documentation

## 2015-11-13 DIAGNOSIS — R7303 Prediabetes: Secondary | ICD-10-CM | POA: Insufficient documentation

## 2015-11-13 DIAGNOSIS — F411 Generalized anxiety disorder: Secondary | ICD-10-CM | POA: Diagnosis not present

## 2015-11-13 DIAGNOSIS — E785 Hyperlipidemia, unspecified: Secondary | ICD-10-CM | POA: Insufficient documentation

## 2015-11-13 DIAGNOSIS — M545 Low back pain: Secondary | ICD-10-CM | POA: Insufficient documentation

## 2015-11-13 DIAGNOSIS — I493 Ventricular premature depolarization: Secondary | ICD-10-CM | POA: Diagnosis not present

## 2015-11-13 DIAGNOSIS — M4806 Spinal stenosis, lumbar region: Secondary | ICD-10-CM | POA: Insufficient documentation

## 2015-11-13 DIAGNOSIS — M47896 Other spondylosis, lumbar region: Secondary | ICD-10-CM | POA: Diagnosis not present

## 2015-11-13 DIAGNOSIS — H269 Unspecified cataract: Secondary | ICD-10-CM | POA: Insufficient documentation

## 2015-11-13 DIAGNOSIS — N3020 Other chronic cystitis without hematuria: Secondary | ICD-10-CM | POA: Diagnosis not present

## 2015-11-13 DIAGNOSIS — M79605 Pain in left leg: Secondary | ICD-10-CM | POA: Diagnosis present

## 2015-11-13 DIAGNOSIS — F39 Unspecified mood [affective] disorder: Secondary | ICD-10-CM | POA: Diagnosis not present

## 2015-11-13 DIAGNOSIS — R413 Other amnesia: Secondary | ICD-10-CM | POA: Insufficient documentation

## 2015-11-13 DIAGNOSIS — M5416 Radiculopathy, lumbar region: Secondary | ICD-10-CM | POA: Insufficient documentation

## 2015-11-13 DIAGNOSIS — M797 Fibromyalgia: Secondary | ICD-10-CM | POA: Diagnosis not present

## 2015-11-13 DIAGNOSIS — F329 Major depressive disorder, single episode, unspecified: Secondary | ICD-10-CM | POA: Insufficient documentation

## 2015-11-13 DIAGNOSIS — Z9181 History of falling: Secondary | ICD-10-CM | POA: Insufficient documentation

## 2015-11-13 DIAGNOSIS — R61 Generalized hyperhidrosis: Secondary | ICD-10-CM | POA: Diagnosis not present

## 2015-11-13 DIAGNOSIS — Z7982 Long term (current) use of aspirin: Secondary | ICD-10-CM | POA: Insufficient documentation

## 2015-11-13 DIAGNOSIS — M792 Neuralgia and neuritis, unspecified: Secondary | ICD-10-CM | POA: Diagnosis not present

## 2015-11-13 DIAGNOSIS — E039 Hypothyroidism, unspecified: Secondary | ICD-10-CM | POA: Diagnosis not present

## 2015-11-13 MED ORDER — PREGABALIN 75 MG PO CAPS: 75.0000 mg | ORAL_CAPSULE | Freq: Three times a day (TID) | ORAL | 5 refills | Status: DC

## 2015-11-13 NOTE — Progress Notes (Signed)
Patient here for post procedure evaluation and medication management.  Patient has some questions about her current medications.  Safety precautions to be maintained throughout the outpatient stay will include: orient to surroundings, keep bed in low position, maintain call bell within reach at all times, provide assistance with transfer out of bed and ambulation.

## 2015-11-13 NOTE — Progress Notes (Signed)
Patient's Name: Cheyenne Pierce  Patient type: Established  MRN: [id number]  Service setting: Ambulatory outpatient  DOB: July 11, 1934  Location: [facility]  DOS: 11/13/2015  Primary Care Physician: Ria Bush, MD  Note by: Kathlen Brunswick. [name], M.D, [place]in, Albright, DABPM, Milagros Evener, [place]  Referring Physician: Ria Bush, MD  Specialty: Board-Certified Interventional Pain Management  Last Visit to Pain Management: 10/05/2015   Primary Reason(s) for Visit: Encounter for prescription drug management & post-procedure evaluation of chronic illness with mild to moderate exacerbation(Level of risk: moderate) CC: Back Pain (lower) and Leg Pain (bilateral into feet, left is worse)   HPI  Cheyenne Pierce is a 81 y.o. year old, female patient, who returns today as an established patient. She has Hypothyroidism; GAD (generalized anxiety disorder); Hereditary and idiopathic peripheral neuropathy; Essential hypertension; Reflux esophagitis; GERD; INGUINAL HERNIA, LEFT; Irritable bowel syndrome; OTHER CHRONIC CYSTITIS; Osteopenia; Systolic murmur; Medicare annual wellness visit, subsequent; PVCs (premature ventricular contractions); Prediabetes; Mood swings ([place]); Memory deficit; Advanced care planning/counseling discussion; HLD (hyperlipidemia); Health maintenance examination; Recurrent falls; Night sweats; Chronic pain syndrome; Encounter for therapeutic drug level monitoring; Encounter for chronic pain management; Chronic low back pain; Chronic neck pain (R>L); Cervical spondylosis; Chronic cervical radicular pain (Bilateral) (R>L); Numbness of upper extremity (Bilateral) (R>L); Upper extremity weakness (Bilateral); Neurogenic pain; Neuropathic pain; Inflammatory pain; Chronic Lumbar Radicular pain (Left); Allodynia; Encounter for long-term current use of medication; Lumbar spondylosis; Chronic Lumbar Radiculopathy (Left); Chronic lower extremity pain (Bilateral) (L>R); Chronic hip pain  (Right); Abnormal Cervical MRI (01/24/15); Cervical spinal stenosis (C3 through C7); Cervical facet hypertrophy; Cervical foraminal stenosis (C3-4 through C6-7) (R>L); Chronic cervical radiculopathy (Bilateral) (R>L) (pain, numbness, and weakness); Abnormal Lumbar MRI (11/10/2014); Lumbar spinal stenosis (L3-4); Lumbar foraminal stenosis (Bilateral L3-4) (Right L4-5); MDD (major depressive disorder), recurrent episode, moderate (HCC); and Fibromyalgia on her problem list.. Her primarily concern today is the Back Pain (lower) and Leg Pain (bilateral into feet, left is worse)   Pain Assessment: Self-Reported Pain Score: 1              Reported level is compatible with observation       Pain Type: Chronic pain Pain Location: Back Pain Orientation: Lower Pain Descriptors / Indicators: Radiating Pain Frequency: Constant  The patient comes into the clinics today for post-procedure evaluation on the interventional treatment done on 10/04/2015. In addition, she comes in today for pharmacological management of her chronic pain.  The patient  reports that she does not use drugs. The patient indicates that the last procedure worked very well for her and her pain is currently under good control. We will see her on a when necessary basis and repeat as necessary. She has already been evaluated by the neurosurgeons and found to be a poor surgical candidate.  Date of Last Visit: 10/04/15 Service Provided on Last Visit: Procedure  Controlled Substance Pharmacotherapy Assessment & REMS (Risk Evaluation and Mitigation Strategy)  Review:   The patient is not getting any controlled substances from [place]. She is very sensitive to medications and does not seem to tolerate controlled substances well. All we are providing her with is gabapentin. However, she was recently started on Lyrica and found it to be better and therefore we will be switching and taken over this regimen for her.  Monitoring: [place] PMP: Online review of  the past 9-month period conducted. Compliant with practice rules and regulations Last UDS on record: [employer] Select 13  Date Value Ref Range Status  03/19/2015 FINAL  Final    Comment:    ==================================================================== TOXASSURE SELECT 13 (MW) ==================================================================== Test                             Result       Flag       Units Drug Present not Declared for Prescription Verification   Oxazepam                       612          UNEXPECTED ng/mg creat   Temazepam                      400          UNEXPECTED ng/mg creat    Oxazepam and temazepam are expected metabolites of diazepam.    Oxazepam is also an expected metabolite of other benzodiazepine    drugs, including chlordiazepoxide, prazepam, clorazepate,    halazepam, and temazepam.  Oxazepam and temazepam are available    as scheduled prescription medications. ==================================================================== Test                      Result    Flag   Units      Ref Range   Creatinine              26               mg/dL      >=20 ==================================================================== Declared Medications:  The flagging and interpretation on this report are based on the  following declared medications.  Unexpected results may arise from  inaccuracies in the declared medications.  **Note: The testing scope of this panel does not include following  reported medications:  Bupropion (Wellbutrin)  Gabapentin (Neurontin) ==================================================================== For clinical consultation, please call [phone]. ====================================================================    Treatment compliance: Compliant Risk Assessment: Aberrant Behavior: None observed today Substance Use Disorder (SUD) Risk Level: Low Risk of opioid abuse or dependence: 0.7-3.0% with doses ? 36 MME/day  and 6.1-26% with doses ? 120 MME/day. Opioid Risk Tool (ORT) Score: Total Score: 0 Low Risk for SUD (Score <3) Depression Scale Score: PHQ-2: PHQ-2 Total Score: 0 No depression (0) PHQ-9: PHQ-9 Total Score: 0 No depression (0-4)  Pharmacologic Plan: No change in therapy, at this time  Post-Procedure Assessment  Procedure done on last visit: Left-sided L4-5 translaminar epidural steroid injection under fluoroscopic guidance, no sedation. Side-effects or Adverse reactions: None reported Sedation: No sedation used  Results: Ultra-Short Term Relief (First 1 hour after procedure): 50 %  No IV Analgesics or Anxiolytics given, therefore the benefit is completely due to Local Anesthetics Short Term Relief (Initial 4-6 hrs after procedure): 30 % Complete relief confirms area to be the source of pain Long Term Relief : 90 % (patient reports good relief from procedure.  ) Long-term benefit would suggest an inflammatory etiology to the pain   Current Relief (Now): 90%  Persistent relief would suggest effective anti-inflammatory effects from steroids Interpretation of Results: The results of this interventional therapy prove to be very effective as a palliative tool for her chronic recurrent pain.  Laboratory Chemistry  Inflammation Markers Lab Results  Component Value Date   ESRSEDRATE 14 10/16/2014   CRP 0.7 09/12/2014    Renal Function Lab Results  Component Value Date   BUN 18 10/11/2015   CREATININE 0.79 10/11/2015   GFRAA >60 05/22/2015  GFRNONAA >60 05/22/2015    Hepatic Function Lab Results  Component Value Date   AST 27 02/07/2015   ALT 21 02/07/2015   ALBUMIN 4.0 02/07/2015    Electrolytes Lab Results  Component Value Date   NA 138 10/11/2015   K 3.9 10/11/2015   CL 102 10/11/2015   CALCIUM 9.9 10/11/2015    Pain Modulating Vitamins Lab Results  Component Value Date   VD25OH 47.39 10/04/2013   VITAMINB12 >1500 (H) 09/22/2012    Coagulation Parameters Lab  Results  Component Value Date   INR 1.0 11/08/2012   LABPROT 13.8 11/08/2012   APTT 32.6 11/08/2012   PLT 286 05/22/2015    Cardiovascular Lab Results  Component Value Date   HGB 13.8 05/22/2015   HCT 40.6 05/22/2015    Note: Lab results reviewed.  Recent Diagnostic Imaging  Ct Cervical Spine Wo Contrast  Result Date: 09/26/2015 CLINICAL DATA:  81 year old female with severe cervical neck pain radiating to the shoulder blades. Progressive symptoms since cervical fusion in February. Subsequent encounter. EXAM: CT CERVICAL SPINE WITHOUT CONTRAST TECHNIQUE: Multidetector CT imaging of the cervical spine was performed without intravenous contrast. Multiplanar CT image reconstructions were also generated. COMPARISON:  Outside cervical spine MRI 01/24/2015. Preoperative cervical spine CT 04/12/2015. Intraoperative images [id number]. Postoperative [facility] neurosurgery cervical radiographs 07/04/2015. FINDINGS: Improved cervical lordosis compared to the preoperative studies. Visualized skull base is intact. No atlanto-occipital dissociation. Bilateral posterior element alignment is within normal limits. Trace anterolisthesis at C7-T1 and T1-T2 is stable with associated mild to moderate chronic facet hypertrophy. Visible upper thoracic levels appear stable and intact. Negative visualized posterior fossa. Stable and well pneumatized visualized paranasal sinuses and mastoids. Prevertebral and paraspinal soft tissues appear normal. Calcified atherosclerosis re- demonstrated at the carotid bifurcations. Negative lung apices. Calcified atherosclerosis of the visible aortic arch. C2-C3: Subtle disc bulging and mild ligament flavum hypertrophy may be increased. Mild right facet hypertrophy is stable. No significant stenosis. C3-C4: C3 and C4 cortical screws appear intact without loosening. Interbody implant placement appears stable. There is evidence of developing interbody ossification to the left of midline  (coronal image 23), but no convincing arthrodesis at this time. Mild uncovertebral hypertrophy on the left. C4-C5: C4 and C5 cortical screws appear intact without loosening. Interbody implant placement is stable. No interbody arthrodesis at this time. Mild to moderate facet and uncovertebral hypertrophy re- demonstrated greater on the left. C5-C6: C5 and C6 cortical screws appear intact without loosening. Stable interbody implant. No interbody arthrodesis at this time. Left side facet and uncovertebral hypertrophy re- demonstrated. C6-C7: C6 and C7 cortical screws appear intact. There is mild lucency surrounding the left C7 screw best seen on coronal image 18. Interbody implant positioning is stable. No interbody arthrodesis at this time. Mild-to-moderate facet and uncovertebral hypertrophy re - demonstrated Ing greater on the left. C7-T1: Stable mild to moderate facet and mild uncovertebral hypertrophy. IMPRESSION: 1. ACDF from the C3 to the C7 level. There is mild lucencies surrounding the left C7 cortical screw which may indicate loosening. Otherwise no adverse hardware features, and interbody implant placement appears stable. 2. No new findings in the cervical spine. Electronically Signed   By: Genevie Ann M.D.   On: 09/26/2015 13:56    Meds  The patient has a current medication list which includes the following prescription(s): amlodipine, aspirin, biotin, diphenhydramine, hyoscyamine, lactobacillus rhamnosus (gg), levothyroxine, magnesium, meclizine, multiple vitamins-minerals, nitroglycerin, pantoprazole, potassium, pregabalin, and triamterene-hydrochlorothiazide.  Current Outpatient Prescriptions on File Prior to Visit  Medication  Sig  . amLODipine (NORVASC) 2.5 MG tablet Take 1 tablet (2.5 mg total) by mouth daily.  Marland Kitchen aspirin 81 MG tablet Take 81 mg by mouth. Twice a week  . Biotin 1000 MCG tablet Take 1,000 mcg by mouth daily.  . diphenhydrAMINE (BENADRYL) 25 mg capsule Take 25 mg by mouth at  bedtime.   . hyoscyamine (LEVSIN, ANASPAZ) 0.125 MG tablet Take 1 tablet (0.125 mg total) by mouth 2 (two) times daily as needed.  . Lactobacillus Rhamnosus, GG, (CULTURELLE PO) Take 1 capsule by mouth daily.  Marland Kitchen levothyroxine (SYNTHROID, LEVOTHROID) 50 MCG tablet take 1 tablet by mouth once daily TAKE EXTRA TABLET ON MONDAY AND FRIDAY  . Magnesium 250 MG TABS Take by mouth daily.  . meclizine (ANTIVERT) 25 MG tablet take 1 tablet by mouth three times a day if needed  . Multiple Vitamins-Minerals (CENTRUM SILVER PO) Take by mouth daily.   . nitroGLYCERIN (NITROSTAT) 0.4 MG SL tablet Place 0.4 mg under the tongue every 5 (five) minutes as needed.  . pantoprazole (PROTONIX) 40 MG tablet Take 1 tablet (40 mg total) by mouth daily.  . Potassium 99 MG TABS Take 2 tablets by mouth daily.  Marland Kitchen triamterene-hydrochlorothiazide (MAXZIDE-25) 37.5-25 MG tablet take 1 tablet by mouth once daily   No current facility-administered medications on file prior to visit.     ROS  Constitutional: Denies any fever or chills Gastrointestinal: No reported hemesis, hematochezia, vomiting, or acute GI distress Musculoskeletal: Denies any acute onset joint swelling, redness, loss of ROM, or weakness Neurological: No reported episodes of acute onset apraxia, aphasia, dysarthria, agnosia, amnesia, paralysis, loss of coordination, or loss of consciousness  Allergies  Cheyenne Pierce is allergic to codeine; penicillins; prednisone; zoloft [sertraline hcl]; amoxicillin; codeine phosphate; lactose intolerance (gi); metronidazole; perindopril erbumine; potassium-containing compounds; prozac [fluoxetine hcl]; tramadol hcl; and oxcarbazepine.  [place]  Medical:  Cheyenne Pierce  has a past medical history of Allergy; Angina pectoris ([place]); Anxiety; Cataract; Chronic back pain; DDD (degenerative disc disease), cervical; DDD (degenerative disc disease), cervical; DDD (degenerative disc disease), lumbar; Dental bridge present; Depression;  Diverticulosis; GERD (gastroesophageal reflux disease); Headache; Heart murmur; History of bronchitis; History of colon polyps; History of gastric ulcer; History of hiatal hernia; Hypertension; Hypothyroidism; IBS (irritable bowel syndrome); Insomnia; Joint pain; Joint swelling; Mass of vagina; Nocturia; Peripheral neuropathy ([place]); Peripheral neuropathy ([place]); Urinary frequency; Urinary urgency; and Vertigo. Family: family history includes Heart disease in her mother; Stroke in her brother. Surgical:  has a past surgical history that includes Appendectomy (1948); Abdominal hysterectomy (1968); Ovarian cyst surgery (1963); Hernia repair; Colostomy w/ rectocele repair (06/2004); Ventral hernia repair (08/2008); Colonoscopy (07/2009); epidural steroid injections (multiple latest 05/2014, 07/2014); US ECHOCARDIOGRAPHY (2009); NM MYOVIEW LTD (2009); DEXA (2013); Colonoscopy (06/2014); Tonsillectomy; Cardiac catheterization (09/26/1998); bladder suspension with vaginal sling; epidural infections; Esophagogastroduodenoscopy; Anterior cervical decompression/discectomy fusion 4 level (N/A, 06/05/2015); Cataract extraction w/PHACO (Left, 10/15/2015); and Eye surgery (Left, 10/15/2015). Tobacco:  reports that she has quit smoking. She has a 30.00 pack-year smoking history. She has never used smokeless tobacco. Alcohol:  reports that she does not drink alcohol. Drug:  reports that she does not use drugs.  Constitutional Exam  Vitals: Blood pressure (!) 149/74, pulse 66, temperature 98.4 F (36.9 C), temperature source Oral, resp. rate 16, height 5' (1.524 m), weight 125 lb (56.7 kg), SpO2 98 %. General appearance: Well nourished, well developed, and well hydrated. In no acute distress Calculated BMI/Body habitus: Body mass index is 24.41 kg/m. (18.5-24.9 kg/m2) Ideal body  weight Psych/Mental status: Alert and oriented x 3 (person, place, & time) Eyes: PERLA Respiratory: No evidence of acute respiratory  distress  Cervical Spine Exam  Inspection: No masses, redness, or swelling Alignment: Symmetrical Functional ROM: ROM appears unrestricted Stability: No instability detected Muscle strength & Tone: Functionally intact Sensory: Unimpaired Palpation: Non-contributory  Upper Extremity (UE) Exam    Side: Right upper extremity  Side: Left upper extremity  Inspection: No masses, redness, swelling, or asymmetry  Inspection: No masses, redness, swelling, or asymmetry  Functional ROM: ROM appears unrestricted  Functional ROM: ROM appears unrestricted  Muscle strength & Tone: Functionally intact  Muscle strength & Tone: Functionally intact  Sensory: Unimpaired  Sensory: Unimpaired  Palpation: Non-contributory  Palpation: Non-contributory   Thoracic Spine Exam  Inspection: No masses, redness, or swelling Alignment: Symmetrical Functional ROM: ROM appears unrestricted Stability: No instability detected Sensory: Unimpaired Muscle strength & Tone: Functionally intact Palpation: Non-contributory  Lumbar Spine Exam  Inspection: No masses, redness, or swelling Alignment: Symmetrical Functional ROM: ROM appears unrestricted Stability: No instability detected Muscle strength & Tone: Functionally intact Sensory: Unimpaired Palpation: Non-contributory Provocative Tests: Lumbar Hyperextension and rotation test: evaluation deferred today       Patrick's Maneuver: evaluation deferred today              Gait & Posture Assessment  Ambulation: Patient ambulates using a cane Gait: Relatively normal for age and body habitus Posture: WNL   Lower Extremity Exam    Side: Right lower extremity  Side: Left lower extremity  Inspection: No masses, redness, swelling, or asymmetry  Inspection: No masses, redness, swelling, or asymmetry  Functional ROM: ROM appears unrestricted  Functional ROM: ROM appears unrestricted  Muscle strength & Tone: Functionally intact  Muscle strength & Tone: Functionally  intact  Sensory: Unimpaired  Sensory: Unimpaired  Palpation: Non-contributory  Palpation: Non-contributory    Assessment & Plan  Primary Diagnosis & Pertinent Problem List: The primary encounter diagnosis was Chronic Lumbar Radicular pain (Left). Diagnoses of Neurogenic pain and Fibromyalgia were also pertinent to this visit.  Visit Diagnosis: 1. Chronic Lumbar Radicular pain (Left)   2. Neurogenic pain   3. Fibromyalgia     Problems updated and reviewed during this visit: No problems updated.  Problem-specific Plan(s): No problem-specific Assessment & Plan notes found for this encounter.  No new Assessment & Plan notes have been filed under this hospital service since the last note was generated. Service: Pain Management   Plan of Care   Problem List Items Addressed This Visit      High   Chronic Lumbar Radicular pain (Left) - Primary (Chronic)   Relevant Orders   Lumbar Epidural Injection   Fibromyalgia (Chronic)   Relevant Medications   pregabalin (LYRICA) 75 MG capsule   Neurogenic pain (Chronic)   Relevant Medications   pregabalin (LYRICA) 75 MG capsule    Other Visit Diagnoses   None.      Pharmacotherapy (Medications Ordered): Meds ordered this encounter  Medications  . pregabalin (LYRICA) 75 MG capsule    Sig: Take 1 capsule (75 mg total) by mouth 3 (three) times daily.    Dispense:  90 capsule    Refill:  5    Do not add this medication to the electronic "Automatic Refill" notification system. Patient may have prescription filled one day early if pharmacy is closed on scheduled refill date.    Lab-work & Procedure Ordered: Orders Placed This Encounter  Procedures  . Lumbar Epidural Injection  Imaging Ordered: None  Interventional Therapies: Scheduled:  None at this point.    Considering:   1.  PRN Palliative cervical epidural steroid injections under fluoroscopic guidance, with a without sedation.  2.  PRN Palliative left L4-5 lumbar  epidural steroid injection under fluoroscopic guidance, without sedation    PRN Procedures:   1.  PRN Palliative cervical epidural steroid injections under fluoroscopic guidance, with a without sedation.  2.  PRN Palliative left L4-5 lumbar epidural steroid injection under fluoroscopic guidance, without sedation    Referral(s) or Consult(s): None at this time.  New Prescriptions   No medications on file    Medications administered during this visit: Cheyenne Pierce had no medications administered during this visit.  Requested PM Follow-up: Return in 6 months (on 05/05/2016) for (6-Mo) Med-Mgmt, In addition, (PRN) Procedure.  Future Appointments Date Time Provider [place]  04/18/2016 12:00 PM Ria Bush, MD [id number]  04/30/2016 1:00 PM Milinda Pointer, MD [facility] None    Primary Care Physician: Ria Bush, MD Location: [facility] Note by: Kathlen Brunswick. [name], M.D, DABA, DABAPM, DABPM, DABIPP, FIPP  Pain Score Disclaimer: We use the NRS-11 scale. This is a self-reported, subjective measurement of pain severity with only modest accuracy. It is used primarily to identify changes within a particular patient. It must be understood that outpatient pain scales are significantly less accurate that those used for research, where they can be applied under ideal controlled circumstances with minimal exposure to variables. In reality, the score is likely to be a combination of pain intensity and pain affect, where pain affect describes the degree of emotional arousal or changes in action readiness caused by the sensory experience of pain. Factors such as social and work situation, setting, emotional state, anxiety levels, expectation, and prior pain experience may influence pain perception and show large inter-individual differences that may also be affected by time variables.  Patient instructions provided at this appointment:: There  are no Patient Instructions on file for this visit.

## 2015-11-20 DIAGNOSIS — H2511 Age-related nuclear cataract, right eye: Secondary | ICD-10-CM | POA: Diagnosis not present

## 2015-11-22 NOTE — Discharge Instructions (Signed)

## 2015-11-26 ENCOUNTER — Ambulatory Visit: Payer: Medicare Other | Admitting: Anesthesiology

## 2015-11-26 ENCOUNTER — Encounter
Admission: RE | Disposition: A | Discharge status: Home / Self Care | Payer: Self-pay | Source: Ambulatory Visit | Attending: Ophthalmology

## 2015-11-26 ENCOUNTER — Ambulatory Visit
Admission: RE | Admit: 2015-11-26 | Discharge: 2015-11-26 | Disposition: A | Discharge status: Home / Self Care | Payer: Medicare Other | Source: Ambulatory Visit | Attending: Ophthalmology | Admitting: Ophthalmology

## 2015-11-26 ENCOUNTER — Encounter: Payer: Self-pay | Admitting: Anesthesiology

## 2015-11-26 DIAGNOSIS — M1990 Unspecified osteoarthritis, unspecified site: Secondary | ICD-10-CM | POA: Insufficient documentation

## 2015-11-26 DIAGNOSIS — Z881 Allergy status to other antibiotic agents status: Secondary | ICD-10-CM | POA: Diagnosis not present

## 2015-11-26 DIAGNOSIS — Z87891 Personal history of nicotine dependence: Secondary | ICD-10-CM | POA: Insufficient documentation

## 2015-11-26 DIAGNOSIS — R011 Cardiac murmur, unspecified: Secondary | ICD-10-CM | POA: Insufficient documentation

## 2015-11-26 DIAGNOSIS — Z9842 Cataract extraction status, left eye: Secondary | ICD-10-CM | POA: Insufficient documentation

## 2015-11-26 DIAGNOSIS — Z981 Arthrodesis status: Secondary | ICD-10-CM | POA: Diagnosis not present

## 2015-11-26 DIAGNOSIS — Z885 Allergy status to narcotic agent status: Secondary | ICD-10-CM | POA: Insufficient documentation

## 2015-11-26 DIAGNOSIS — K219 Gastro-esophageal reflux disease without esophagitis: Secondary | ICD-10-CM | POA: Insufficient documentation

## 2015-11-26 DIAGNOSIS — F418 Other specified anxiety disorders: Secondary | ICD-10-CM | POA: Insufficient documentation

## 2015-11-26 DIAGNOSIS — I110 Hypertensive heart disease with heart failure: Secondary | ICD-10-CM | POA: Insufficient documentation

## 2015-11-26 DIAGNOSIS — G629 Polyneuropathy, unspecified: Secondary | ICD-10-CM | POA: Diagnosis not present

## 2015-11-26 DIAGNOSIS — I351 Nonrheumatic aortic (valve) insufficiency: Secondary | ICD-10-CM | POA: Insufficient documentation

## 2015-11-26 DIAGNOSIS — E039 Hypothyroidism, unspecified: Secondary | ICD-10-CM | POA: Insufficient documentation

## 2015-11-26 DIAGNOSIS — M797 Fibromyalgia: Secondary | ICD-10-CM | POA: Insufficient documentation

## 2015-11-26 DIAGNOSIS — Z888 Allergy status to other drugs, medicaments and biological substances status: Secondary | ICD-10-CM | POA: Insufficient documentation

## 2015-11-26 DIAGNOSIS — F329 Major depressive disorder, single episode, unspecified: Secondary | ICD-10-CM | POA: Diagnosis not present

## 2015-11-26 DIAGNOSIS — I509 Heart failure, unspecified: Secondary | ICD-10-CM | POA: Diagnosis not present

## 2015-11-26 DIAGNOSIS — H2511 Age-related nuclear cataract, right eye: Secondary | ICD-10-CM | POA: Insufficient documentation

## 2015-11-26 HISTORY — PX: CATARACT EXTRACTION W/PHACO: SHX586

## 2015-11-26 SURGERY — PHACOEMULSIFICATION, CATARACT, WITH IOL INSERTION
Anesthesia: Monitor Anesthesia Care | Site: Eye | Laterality: Right | Wound class: Clean

## 2015-11-26 MED ORDER — BRIMONIDINE TARTRATE 0.2 % OP SOLN
OPHTHALMIC | Status: DC | PRN
  Administered 2015-11-26: 1 [drp] via OPHTHALMIC

## 2015-11-26 MED ORDER — MOXIFLOXACIN HCL 0.5 % OP SOLN
OPHTHALMIC | Status: DC | PRN
  Administered 2015-11-26: .3 [drp] via OPHTHALMIC

## 2015-11-26 MED ORDER — POVIDONE-IODINE 5 % OP SOLN
1.0000 "application " | OPHTHALMIC | Status: DC | PRN
  Administered 2015-11-26: 1 via OPHTHALMIC

## 2015-11-26 MED ORDER — MIDAZOLAM HCL 2 MG/2ML IJ SOLN
INTRAMUSCULAR | Status: DC | PRN
  Administered 2015-11-26: 2 mg via INTRAVENOUS

## 2015-11-26 MED ORDER — TETRACAINE HCL 0.5 % OP SOLN
1.0000 [drp] | OPHTHALMIC | Status: DC | PRN
  Administered 2015-11-26: 1 [drp] via OPHTHALMIC

## 2015-11-26 MED ORDER — EPINEPHRINE HCL 1 MG/ML IJ SOLN
INTRAOCULAR | Status: DC | PRN
  Administered 2015-11-26: 106 mL via OPHTHALMIC

## 2015-11-26 MED ORDER — FENTANYL CITRATE (PF) 100 MCG/2ML IJ SOLN
INTRAMUSCULAR | Status: DC | PRN
  Administered 2015-11-26: 50 ug via INTRAVENOUS

## 2015-11-26 MED ORDER — ARMC OPHTHALMIC DILATING GEL
1.0000 "application " | OPHTHALMIC | Status: DC | PRN
  Administered 2015-11-26 (×2): 1 via OPHTHALMIC

## 2015-11-26 MED ORDER — LIDOCAINE HCL (PF) 4 % IJ SOLN
INTRAMUSCULAR | Status: DC | PRN
  Administered 2015-11-26: 1 mL via OPHTHALMIC

## 2015-11-26 MED ORDER — NA HYALUR & NA CHOND-NA HYALUR 0.4-0.35 ML IO KIT
PACK | INTRAOCULAR | Status: DC | PRN
  Administered 2015-11-26: 1 mL via INTRAOCULAR

## 2015-11-26 MED ORDER — TIMOLOL MALEATE 0.5 % OP SOLN
OPHTHALMIC | Status: DC | PRN
  Administered 2015-11-26: 1 [drp] via OPHTHALMIC

## 2015-11-26 SURGICAL SUPPLY — 31 items
APL FBRTP 3 NS LF CTTN WD (MISCELLANEOUS) ×1
APPLICATOR COTTON TIP 3IN (MISCELLANEOUS) ×2 IMPLANT
CANNULA ANT/CHMB 27G (MISCELLANEOUS) ×1 IMPLANT
CANNULA ANT/CHMB 27GA (MISCELLANEOUS) ×2 IMPLANT
DISSECTOR HYDRO NUCLEUS 50X22 (MISCELLANEOUS) ×2 IMPLANT
GLOVE BIO SURGEON STRL SZ7 (GLOVE) ×3 IMPLANT
GLOVE SURG LX 6.5 MICRO (GLOVE) ×1
GLOVE SURG LX STRL 6.5 MICRO (GLOVE) ×1 IMPLANT
GOWN STRL REUS W/ TWL LRG LVL3 (GOWN DISPOSABLE) ×2 IMPLANT
GOWN STRL REUS W/TWL LRG LVL3 (GOWN DISPOSABLE) ×4
LENS IOL ACRYSOF IQ 20.0 (Intraocular Lens) ×1 IMPLANT
MARKER SKIN DUAL TIP RULER LAB (MISCELLANEOUS) ×2 IMPLANT
NDL FILTER BLUNT 18X1 1/2 (NEEDLE) ×1 IMPLANT
NEEDLE FILTER BLUNT 18X 1/2SAF (NEEDLE) ×1
NEEDLE FILTER BLUNT 18X1 1/2 (NEEDLE) ×1 IMPLANT
PACK CATARACT BRASINGTON (MISCELLANEOUS) ×2 IMPLANT
PACK EYE AFTER SURG (MISCELLANEOUS) ×2 IMPLANT
PACK OPTHALMIC (MISCELLANEOUS) ×2 IMPLANT
RING MALYGIN 7.0 (MISCELLANEOUS) IMPLANT
SOL BAL SALT 15ML (MISCELLANEOUS)
SOLUTION BAL SALT 15ML (MISCELLANEOUS) IMPLANT
SUT ETHILON 10-0 CS-B-6CS-B-6 (SUTURE)
SUT VICRYL  9 0 (SUTURE)
SUT VICRYL 9 0 (SUTURE) IMPLANT
SUTURE EHLN 10-0 CS-B-6CS-B-6 (SUTURE) IMPLANT
SYR 3ML LL SCALE MARK (SYRINGE) ×2 IMPLANT
SYR TB 1ML LUER SLIP (SYRINGE) ×2 IMPLANT
WATER STERILE IRR 250ML POUR (IV SOLUTION) ×2 IMPLANT
WATER STERILE IRR 500ML POUR (IV SOLUTION) IMPLANT
WICK EYE OCUCEL (MISCELLANEOUS) IMPLANT
WIPE NON LINTING 3.25X3.25 (MISCELLANEOUS) ×2 IMPLANT

## 2015-11-26 NOTE — Anesthesia Postprocedure Evaluation (Signed)
Anesthesia Post Note  Patient: Cheyenne Pierce  Procedure(s) Performed: Procedure(s) (LRB): CATARACT EXTRACTION PHACO AND INTRAOCULAR LENS PLACEMENT (IOC) (Right)  Patient location during evaluation: PACU Anesthesia Type: MAC Level of consciousness: awake and alert Pain management: pain level controlled Vital Signs Assessment: post-procedure vital signs reviewed and stable Respiratory status: spontaneous breathing, nonlabored ventilation, respiratory function stable and patient connected to nasal cannula oxygen Cardiovascular status: stable and blood pressure returned to baseline Anesthetic complications: no    Odyssey Vasbinder

## 2015-11-26 NOTE — H&P (Signed)
H+P reviewed and is up to date, please see paper chart.  

## 2015-11-26 NOTE — Op Note (Signed)
Date of Surgery: 11/26/2015  PREOPERATIVE DIAGNOSES: Visually significant nuclear sclerotic cataract, right eye.  POSTOPERATIVE DIAGNOSES: Same  PROCEDURES PERFORMED: Cataract extraction with intraocular lens implant, right eye.  SURGEON: Almon Hercules, M.D.  ANESTHESIA: MAC and topical  IMPLANTS: [id number] +20.0 D  Implant Name Type Inv. Item Serial No. Manufacturer Lot No. LRB No. Used  LENS IOL ACRYSOF IQ 20.0 - [id number] Intraocular Lens LENS IOL ACRYSOF IQ 20.0 [id number] ALCON   Right 1     COMPLICATIONS: None.  DESCRIPTION OF PROCEDURE: Therapeutic options were discussed with the patient preoperatively, including a discussion of risks and benefits of surgery. Informed consent was obtained. An IOL-Master and immersion biometry were used to take the lens measurements, and a dilated fundus exam was performed within 6 months of the surgical date.  The patient was premedicated and brought to the operating room and placed on the operating table in the supine position. After adequate anesthesia, the patient was prepped and draped in the usual sterile ophthalmic fashion. A wire lid speculum was inserted and the microscope was positioned. A Superblade was used to create a paracentesis site at the limbus and a small amount of dilute preservative free lidocaine was instilled into the anterior chamber, followed by dispersive viscoelastic. A clear corneal incision was created temporally using a 2.4 mm keratome blade. Capsulorrhexis was then performed. In situ phacoemulsification was performed.  Cortical material was removed with the irrigation-aspiration unit. Dispersive viscoelastic was instilled to open the capsular bag. A posterior chamber intraocular lens with the specifications above was inserted and positioned. Irrigation-aspiration was used to remove all viscoelastic. Vigamox 1cc was instilled into the anterior chamber, and the corneal incision was checked and found to be water tight. The  eyelid speculum was removed.  The operative eye was covered with protective goggles after instilling 1 drop of timolol and brimonidine. The patient tolerated the procedure well. There were no complications.

## 2015-11-26 NOTE — Anesthesia Preprocedure Evaluation (Addendum)
Anesthesia Evaluation  Patient identified by MRN, date of birth, ID band Patient awake    Reviewed: Allergy & Precautions, H&P , NPO status   History of Anesthesia Complications Negative for: history of anesthetic complications  Airway Mallampati: II  TM Distance: >3 FB Neck ROM: full    Dental no notable dental hx.    Pulmonary neg pulmonary ROS, former smoker,    Pulmonary exam normal        Cardiovascular Exercise Tolerance: Good hypertension, Normal cardiovascular exam+ Valvular Problems/Murmurs (trivial AR) AI      Neuro/Psych  Headaches, PSYCHIATRIC DISORDERS Anxiety Depression Neuropathy feet    GI/Hepatic Neg liver ROS, GERD  Controlled,ibs;   Endo/Other  Hypothyroidism   Renal/GU negative Renal ROS  negative genitourinary   Musculoskeletal  (+) Arthritis , Fibromyalgia -Ddd;  acdf c4-6   Abdominal   Peds  Hematology negative hematology ROS (+)   Anesthesia Other Findings Echo: 05/2015: - Left ventricle: The cavity size was normal. Wall thickness was   normal. Systolic function was normal. The estimated ejection   fraction was in the range of 60% to 65%. Wall motion was normal;   there were no regional wall motion abnormalities. Doppler   parameters are consistent with abnormal left ventricular   relaxation (grade 1 diastolic dysfunction). - Aortic valve: Trileaflet; normal thickness, mildly calcified   leaflets. There was trivial regurgitation. - Pulmonary arteries: Systolic pressure was within the normal range.  Reproductive/Obstetrics                            Anesthesia Physical Anesthesia Plan  ASA: III  Anesthesia Plan: MAC   Post-op Pain Management:    Induction:   Airway Management Planned:   Additional Equipment:   Intra-op Plan:   Post-operative Plan:   Informed Consent: I have reviewed the patients History and Physical, chart, labs and discussed the  procedure including the risks, benefits and alternatives for the proposed anesthesia with the patient or authorized representative who has indicated his/her understanding and acceptance.     Plan Discussed with: CRNA  Anesthesia Plan Comments:         Anesthesia Quick Evaluation

## 2015-11-26 NOTE — Transfer of Care (Signed)
Immediate Anesthesia Transfer of Care Note  Patient: Cheyenne Pierce  Procedure(s) Performed: Procedure(s) with comments: CATARACT EXTRACTION PHACO AND INTRAOCULAR LENS PLACEMENT (IOC) (Right) - RIGHT  Patient Location: PACU  Anesthesia Type: MAC  Level of Consciousness: awake, alert  and patient cooperative  Airway and Oxygen Therapy: Patient Spontanous Breathing and Patient connected to supplemental oxygen  Post-op Assessment: Post-op Vital signs reviewed, Patient's Cardiovascular Status Stable, Respiratory Function Stable, Patent Airway and No signs of Nausea or vomiting  Post-op Vital Signs: Reviewed and stable  Complications: No apparent anesthesia complications

## 2015-11-26 NOTE — Anesthesia Procedure Notes (Signed)
Procedure Name: MAC Performed by: Dayani Winbush Pre-anesthesia Checklist: Patient identified, Emergency Drugs available, Suction available, Timeout performed and Patient being monitored Patient Re-evaluated:Patient Re-evaluated prior to inductionOxygen Delivery Method: Nasal cannula Placement Confirmation: positive ETCO2     

## 2015-11-27 ENCOUNTER — Encounter: Payer: Self-pay | Admitting: Ophthalmology

## 2015-12-13 DIAGNOSIS — Z1231 Encounter for screening mammogram for malignant neoplasm of breast: Secondary | ICD-10-CM | POA: Diagnosis not present

## 2015-12-14 LAB — HM MAMMOGRAPHY

## 2015-12-18 ENCOUNTER — Ambulatory Visit: Payer: Medicare Other | Attending: Pain Medicine | Admitting: Pain Medicine

## 2015-12-18 ENCOUNTER — Encounter: Payer: Self-pay | Admitting: Pain Medicine

## 2015-12-18 VITALS — BP 164/86 | HR 59 | Temp 97.9°F | Resp 10 | Ht 60.0 in | Wt 125.0 lb

## 2015-12-18 DIAGNOSIS — M8580 Other specified disorders of bone density and structure, unspecified site: Secondary | ICD-10-CM | POA: Diagnosis not present

## 2015-12-18 DIAGNOSIS — M545 Low back pain, unspecified: Secondary | ICD-10-CM

## 2015-12-18 DIAGNOSIS — R7303 Prediabetes: Secondary | ICD-10-CM | POA: Insufficient documentation

## 2015-12-18 DIAGNOSIS — R202 Paresthesia of skin: Secondary | ICD-10-CM | POA: Diagnosis not present

## 2015-12-18 DIAGNOSIS — K589 Irritable bowel syndrome without diarrhea: Secondary | ICD-10-CM | POA: Insufficient documentation

## 2015-12-18 DIAGNOSIS — I493 Ventricular premature depolarization: Secondary | ICD-10-CM | POA: Diagnosis not present

## 2015-12-18 DIAGNOSIS — M79604 Pain in right leg: Secondary | ICD-10-CM | POA: Insufficient documentation

## 2015-12-18 DIAGNOSIS — M79602 Pain in left arm: Secondary | ICD-10-CM

## 2015-12-18 DIAGNOSIS — K219 Gastro-esophageal reflux disease without esophagitis: Secondary | ICD-10-CM | POA: Diagnosis not present

## 2015-12-18 DIAGNOSIS — R61 Generalized hyperhidrosis: Secondary | ICD-10-CM | POA: Diagnosis not present

## 2015-12-18 DIAGNOSIS — M4806 Spinal stenosis, lumbar region: Secondary | ICD-10-CM | POA: Diagnosis not present

## 2015-12-18 DIAGNOSIS — F411 Generalized anxiety disorder: Secondary | ICD-10-CM | POA: Diagnosis not present

## 2015-12-18 DIAGNOSIS — I10 Essential (primary) hypertension: Secondary | ICD-10-CM | POA: Insufficient documentation

## 2015-12-18 DIAGNOSIS — G609 Hereditary and idiopathic neuropathy, unspecified: Secondary | ICD-10-CM | POA: Insufficient documentation

## 2015-12-18 DIAGNOSIS — E039 Hypothyroidism, unspecified: Secondary | ICD-10-CM | POA: Insufficient documentation

## 2015-12-18 DIAGNOSIS — R531 Weakness: Secondary | ICD-10-CM | POA: Diagnosis not present

## 2015-12-18 DIAGNOSIS — N3020 Other chronic cystitis without hematuria: Secondary | ICD-10-CM | POA: Diagnosis not present

## 2015-12-18 DIAGNOSIS — F3289 Other specified depressive episodes: Secondary | ICD-10-CM | POA: Insufficient documentation

## 2015-12-18 DIAGNOSIS — M25551 Pain in right hip: Secondary | ICD-10-CM | POA: Insufficient documentation

## 2015-12-18 DIAGNOSIS — R011 Cardiac murmur, unspecified: Secondary | ICD-10-CM | POA: Insufficient documentation

## 2015-12-18 DIAGNOSIS — M4722 Other spondylosis with radiculopathy, cervical region: Secondary | ICD-10-CM | POA: Insufficient documentation

## 2015-12-18 DIAGNOSIS — M797 Fibromyalgia: Secondary | ICD-10-CM | POA: Insufficient documentation

## 2015-12-18 DIAGNOSIS — M79605 Pain in left leg: Secondary | ICD-10-CM | POA: Diagnosis not present

## 2015-12-18 DIAGNOSIS — E785 Hyperlipidemia, unspecified: Secondary | ICD-10-CM | POA: Diagnosis not present

## 2015-12-18 DIAGNOSIS — F39 Unspecified mood [affective] disorder: Secondary | ICD-10-CM | POA: Insufficient documentation

## 2015-12-18 DIAGNOSIS — M542 Cervicalgia: Secondary | ICD-10-CM | POA: Insufficient documentation

## 2015-12-18 DIAGNOSIS — K4090 Unilateral inguinal hernia, without obstruction or gangrene, not specified as recurrent: Secondary | ICD-10-CM | POA: Diagnosis not present

## 2015-12-18 DIAGNOSIS — M4726 Other spondylosis with radiculopathy, lumbar region: Secondary | ICD-10-CM | POA: Insufficient documentation

## 2015-12-18 DIAGNOSIS — R413 Other amnesia: Secondary | ICD-10-CM | POA: Diagnosis not present

## 2015-12-18 DIAGNOSIS — M79606 Pain in leg, unspecified: Secondary | ICD-10-CM | POA: Diagnosis present

## 2015-12-18 DIAGNOSIS — M5416 Radiculopathy, lumbar region: Secondary | ICD-10-CM

## 2015-12-18 DIAGNOSIS — M4802 Spinal stenosis, cervical region: Secondary | ICD-10-CM | POA: Insufficient documentation

## 2015-12-18 DIAGNOSIS — G8929 Other chronic pain: Secondary | ICD-10-CM

## 2015-12-18 DIAGNOSIS — M48061 Spinal stenosis, lumbar region without neurogenic claudication: Secondary | ICD-10-CM

## 2015-12-18 MED ORDER — SODIUM CHLORIDE 0.9% FLUSH: 2.0000 mL | Freq: Once | INTRAVENOUS | Status: DC

## 2015-12-18 MED ORDER — ROPIVACAINE HCL 2 MG/ML IJ SOLN
2.0000 mL | Freq: Once | INTRAMUSCULAR | Status: AC
Start: 2015-12-18 — End: 2015-12-18
  Administered 2015-12-18: 2 mL via EPIDURAL
  Filled 2015-12-18: qty 10

## 2015-12-18 MED ORDER — LIDOCAINE HCL (PF) 1 % IJ SOLN
10.0000 mL | Freq: Once | INTRAMUSCULAR | Status: AC
  Administered 2015-12-18: 10 mL
  Filled 2015-12-18: qty 10

## 2015-12-18 MED ORDER — TRIAMCINOLONE ACETONIDE 40 MG/ML IJ SUSP
40.0000 mg | Freq: Once | INTRAMUSCULAR | Status: AC
  Administered 2015-12-18: 40 mg
  Filled 2015-12-18: qty 1

## 2015-12-18 MED ORDER — IOPAMIDOL (ISOVUE-M 200) INJECTION 41%
10.0000 mL | Freq: Once | INTRAMUSCULAR | Status: AC
  Administered 2015-12-18: 10 mL via EPIDURAL
  Filled 2015-12-18: qty 10

## 2015-12-18 MED ORDER — SODIUM CHLORIDE 0.9 % IJ SOLN
INTRAMUSCULAR | Status: AC
  Administered 2015-12-18: 15:00:00
  Filled 2015-12-18: qty 10

## 2015-12-18 NOTE — Progress Notes (Signed)
Patient's Name: Cheyenne Pierce  MRN: [id number]  Referring Provider: Milinda Pointer, MD  DOB: [birth year]  PCP: Ria Bush, MD  DOS: 12/18/2015  Note by: Kathlen Brunswick. [name], MD  Service setting: Ambulatory outpatient  Location: [facility] (AMB) Pain Management Facility  Visit type: Procedure  Specialty: Interventional Pain Management  Patient type: Established   Primary Reason for Visit: Interventional Pain Management Treatment. CC: Back Pain (left low) and Leg Pain (leg)  Procedure:  Anesthesia, Analgesia, Anxiolysis:  Type: Therapeutic Inter-Laminar Epidural Steroid Injection Region: Lumbar Level: L4-5 Level. Laterality: Left Paramedial  Type: Local Anesthesia Local Anesthetic: Lidocaine 1% Route: Infiltration ([place]/IM) IV Access: Declined Sedation: Declined  Indication(s): Analgesia          Indications: 1. Chronic Lumbar Radiculopathy (Left)   2. Chronic Lumbar Radicular pain (Left)   3. Chronic pain of lower extremity, left   4. Lumbar spinal stenosis (L3-4)   5. Lumbar foraminal stenosis (Bilateral L3-4) (Right L4-5)   6. Chronic low back pain     Pre-procedure Pain Score: 8/10 Post-procedure Pain Score: 0-No pain/10  Pre-Procedure Assessment:  Cheyenne Pierce is a 81 y.o. year old, female patient, seen today for interventional treatment. She has Hypothyroidism; GAD (generalized anxiety disorder); Hereditary and idiopathic peripheral neuropathy; Essential hypertension; Reflux esophagitis; GERD; INGUINAL HERNIA, LEFT; Irritable bowel syndrome; OTHER CHRONIC CYSTITIS; Osteopenia; Systolic murmur; Medicare annual wellness visit, subsequent; PVCs (premature ventricular contractions); Prediabetes; Mood swings ([place]); Memory deficit; Advanced care planning/counseling discussion; HLD (hyperlipidemia); Health maintenance examination; Recurrent falls; Night sweats; Chronic pain syndrome; Encounter for therapeutic drug level monitoring; Encounter for chronic pain management; Chronic low back  pain; Chronic neck pain (R>L); Cervical spondylosis; Chronic cervical radicular pain (Bilateral) (R>L); Numbness of upper extremity (Bilateral) (R>L); Upper extremity weakness (Bilateral); Neurogenic pain; Neuropathic pain; Inflammatory pain; Chronic Lumbar Radicular pain (Left); Allodynia; Encounter for long-term current use of medication; Lumbar spondylosis; Chronic Lumbar Radiculopathy (Left); Chronic lower extremity pain (Bilateral) (L>R); Chronic hip pain (Right); Abnormal Cervical MRI (01/24/15); Cervical spinal stenosis (C3 through C7); Cervical facet hypertrophy; Cervical foraminal stenosis (C3-4 through C6-7) (R>L); Chronic cervical radiculopathy (Bilateral) (R>L) (pain, numbness, and weakness); Abnormal Lumbar MRI (11/10/2014); Lumbar spinal stenosis (L3-4); Lumbar foraminal stenosis (Bilateral L3-4) (Right L4-5); MDD (major depressive disorder), recurrent episode, moderate (HCC); and Fibromyalgia on her problem list.. Her primarily concern today is the Back Pain (left low) and Leg Pain (leg)   Pain Location: Back Pain Orientation: Lower Pain Descriptors / Indicators: Burning, Constant, Aching Pain Frequency: Constant  Date of Last Visit: 11/13/15 Service Provided on Last Visit: Med Refill  Coagulation Parameters Lab Results  Component Value Date   INR 1.0 11/08/2012   LABPROT 13.8 11/08/2012   APTT 32.6 11/08/2012   PLT 286 05/22/2015    Verification of the correct person, correct site (including marking of site), and correct procedure were performed and confirmed by the patient.  Consent: Secured. Under the influence of no sedatives a written informed consent was obtained, after having provided information on the risks and possible complications. To fulfill our ethical and legal obligations, as recommended by the American Medical Association's Code of Ethics, we have provided information to the patient about our clinical impression; the nature and purpose of the treatment or  procedure; the risks, benefits, and possible complications of the intervention; alternatives; the risk(s) and benefit(s) of the alternative treatment(s) or procedure(s); and the risk(s) and benefit(s) of doing nothing. The patient was provided information about the risks and possible complications associated with the procedure. These  include, but are not limited to, failure to achieve desired goals, infection, bleeding, organ or nerve damage, allergic reactions, paralysis, and death. In the case of spinal procedures these may include, but are not limited to, failure to achieve desired goals, infection, bleeding, organ or nerve damage, allergic reactions, paralysis, and death. In addition, the patient was informed that Medicine is not an exact science; therefore, there is also the possibility of unforeseen risks and possible complications that may result in a catastrophic outcome. The patient indicated having understood very clearly. We have given the patient no guarantees and we have made no promises. Enough time was given to the patient to ask questions, all of which were answered to the patient's satisfaction.  Consent Attestation: I, the ordering provider, attest that I have discussed with the patient the benefits, risks, side-effects, alternatives, likelihood of achieving goals, and potential problems during recovery for the procedure that I have provided informed consent.  Pre-Procedure Preparation: Safety Precautions: Allergies reviewed. Appropriate site, procedure, and patient were confirmed by following the Joint Commission's Universal Protocol (UP.01.01.01), in the form of a "Time Out". The patient was asked to confirm marked site and procedure, before commencing. The patient was asked about blood thinners, or active infections, both of which were denied. Patient was assessed for positional comfort and all pressure points were checked before starting procedure. Infection Control Precautions: Sterile  technique used. Standard Universal Precautions were taken as recommended by the Department of [facility] for Disease Control and Prevention (CDC). Standard pre-surgical skin prep was conducted. Respiratory hygiene and cough etiquette was practiced. Hand hygiene observed. Safe injection practices and needle disposal techniques followed. SDV (single dose vial) medications used. Medications properly checked for expiration dates and contaminants. Personal protective equipment (PPE) used: Surgical mask. Sterile Radiation-resistant gloves. Monitoring:  As per clinic protocol. Vitals:   12/18/15 1417 12/18/15 1511 12/18/15 1517  BP: (!) 151/74 (!) 164/83 (!) 164/86  Pulse: 60 65 (!) 59  Resp: _0 Temp: 97.9 F (36.6 C)    SpO2: 97% 98% 100%  Weight: 125 lb (56.7 kg)    Height: 5' (1.524 m)    Calculated BMI: Body mass index is 24.41 kg/m. Allergies: She is allergic to codeine; penicillins; prednisone; zoloft [sertraline hcl]; amoxicillin; codeine phosphate; lactose intolerance (gi); metronidazole; perindopril erbumine; potassium-containing compounds; prozac [fluoxetine hcl]; tramadol hcl; and oxcarbazepine.. Allergy Precautions: None required  Description of Procedure Process:  Time-out: "Time-out" completed before starting procedure, as per protocol. Position: Prone Target Area: For Epidural Steroid injections, the target area is the  interlaminar space, initially targeting the lower border of the superior vertebral body lamina. Approach: Posterior approach. Area Prepped: Entire Posterior Lumbosacral Region Prepping solution: ChloraPrep (2% chlorhexidine gluconate and 70% isopropyl alcohol) Safety Precautions: Aspiration looking for blood return was conducted prior to all injections. At no point did we inject any substances, as a needle was being advanced. No attempts were made at seeking any paresthesias. Safe injection practices and needle disposal techniques used. Medications  properly checked for expiration dates. SDV (single dose vial) medications used. Description of the Procedure: Protocol guidelines were followed. The patient was placed in position over the fluoroscopy table. The target area was identified and the area prepped in the usual manner. Skin desensitized using vapocoolant spray. Skin & deeper tissues infiltrated with local anesthetic. Appropriate amount of time allowed to pass for local anesthetics to take effect. The procedure needle was introduced through the skin, ipsilateral to the reported pain, and advanced  to the target area. Bone was contacted and the needle walked caudad, until the lamina was cleared. The epidural space was identified using "loss-of-resistance technique" with 2-3 ml of PF-NaCl (0.9% NSS), in a 5cc LOR glass syringe. Proper needle placement secured. Negative aspiration confirmed. Solution injected in intermittent fashion, asking for systemic symptoms every 0.5cc of injectate. The needles were then removed and the area cleansed, making sure to leave some of the prepping solution back to take advantage of its long term bactericidal properties. EBL: None Materials & Medications Used:  Needle(s) Used: 20g - 10cm, Tuohy-style epidural needle Medication(s): Please see chart orders for medication and dosing details.  Imaging Guidance:  Type of Imaging Technique: Fluoroscopy Guidance (Spinal) Indication(s): Assistance in needle guidance and placement for procedures requiring needle placement in or near specific anatomical locations not easily accessible without such assistance. Exposure Time: Please see nurses notes. Contrast: Before injecting any contrast, we confirmed that the patient did not have an allergy to iodine, shellfish, or radiological contrast. Once satisfactory needle placement was completed at the desired level, radiological contrast was injected. Injection was conducted under continuous fluoroscopic guidance. Injection of  contrast accomplished without complications. See chart for type and volume of contrast used. Fluoroscopic Guidance: I was personally present in the fluoroscopy suite, where the patient was placed in position for the procedure, over the fluoroscopy-compatible table. Fluoroscopy was manipulated, using "Tunnel Vision Technique", to obtain the best possible view of the target area, on the affected side. Parallax error was corrected before commencing the procedure. A "direction-depth-direction" technique was used to introduce the needle under continuous pulsed fluoroscopic guidance. Once the target was reached, antero-posterior, oblique, and lateral fluoroscopic projection views were taken to confirm needle placement in all planes. Permanently recorded images stored by scanning into EMR. Interpretation: Intraoperative imaging interpretation by performing Physician. Adequate needle placement confirmed in AP & Oblique Views. Appropriate spread of contrast to desired area. No evidence of afferent or efferent intravascular uptake. No intrathecal or subarachnoid spread observed. Permanent images scanned into the patient's record.  Antibiotic Prophylaxis:  Indication(s): No indications identified. Type:  Antibiotics Given (last 72 hours)    None       Post-operative Assessment:   Complications: No immediate post-treatment complications were observed. Relevant Post-operative Information:  Disposition: Return to clinic for follow-up evaluation. The patient tolerated the entire procedure well. A repeat set of vitals were taken after the procedure and the patient was kept under observation following institutional policy, for this procedure. Post-procedural neurological assessment was performed, showing return to baseline, prior to discharge. The patient was discharged home, once institutional criteria were met. The patient was provided with post-procedure discharge instructions, including a section on how to  identify potential problems. Should any problems arise concerning this procedure, the patient was given instructions to immediately contact us, at any time, without hesitation. In any case, we plan to contact the patient by telephone for a follow-up status report regarding this interventional procedure. Comments:  No additional relevant information.  Plan of Care   Problem List Items Addressed This Visit      High   Chronic low back pain (Chronic)   Relevant Medications   triamcinolone acetonide (KENALOG-40) injection 40 mg (Completed)   Chronic lower extremity pain (Bilateral) (L>R) (Chronic)   Chronic Lumbar Radicular pain (Left) (Chronic)   Chronic Lumbar Radiculopathy (Left) - Primary (Chronic)   Relevant Medications   triamcinolone acetonide (KENALOG-40) injection 40 mg (Completed)   iopamidol (ISOVUE-M) 41 % intrathecal injection 10  mL (Completed)   lidocaine (PF) (XYLOCAINE) 1 % injection 10 mL (Completed)   sodium chloride flush (NS) 0.9 % injection 2 mL   ropivacaine (PF) 2 mg/ml (0.2%) (NAROPIN) epidural 2 mL (Completed)   Other Relevant Orders   DG C-Arm 1-60 Min-No Report   Lumbar foraminal stenosis (Bilateral L3-4) (Right L4-5) (Chronic)   Lumbar spinal stenosis (L3-4) (Chronic)    Other Visit Diagnoses   None.     Requested PM Follow-up: Return in about 2 weeks (around 01/01/2016) for Post-Procedure evaluation.  Future Appointments Date Time Provider [place]  01/21/2016 11:00 AM Milinda Pointer, MD [facility] None  04/18/2016 12:00 PM Ria Bush, MD [id number]  04/30/2016 1:00 PM Milinda Pointer, MD [facility] None    Primary Care Physician: Ria Bush, MD Location: [facility] Note by: Kathlen Brunswick. [name], M.D, DABA, DABAPM, DABPM, DABIPP, FIPP  Disclaimer:  Medicine is not an exact science. The only guarantee in medicine is that nothing is guaranteed. It is important to note that the decision to  proceed with this intervention was based on the information collected from the patient. The Data and conclusions were drawn from the patient's questionnaire, the interview, and the physical examination. Because the information was provided in large part by the patient, it cannot be guaranteed that it has not been purposely or unconsciously manipulated. Every effort has been made to obtain as much relevant data as possible for this evaluation. It is important to note that the conclusions that lead to this procedure are derived in large part from the available data. Always take into account that the treatment will also be dependent on availability of resources and existing treatment guidelines, considered by other Pain Management Practitioners as being common knowledge and practice, at the time of the intervention. For Medico-Legal purposes, it is also important to point out that variation in procedural techniques and pharmacological choices are the acceptable norm. The indications, contraindications, technique, and results of the above procedure should only be interpreted and judged by a Board-Certified Interventional Pain Specialist with extensive familiarity and expertise in the same exact procedure and technique. Attempts at providing opinions without similar or greater experience and expertise than that of the treating physician will be considered as inappropriate and unethical, and shall result in a formal complaint to the state medical board and applicable specialty societies.

## 2015-12-18 NOTE — Patient Instructions (Signed)
Pain Management Discharge Instructions  General Discharge Instructions :  If you need to reach your doctor call: Monday-Friday 8:00 am - 4:00 pm at [phone] or toll free [phone].  After clinic hours [phone] to have operator reach doctor.  Bring all of your medication bottles to all your appointments in the pain clinic.  To cancel or reschedule your appointment with Pain Management please remember to call 24 hours in advance to avoid a fee.  Refer to the educational materials which you have been given on: General Risks, I had my Procedure. Discharge Instructions, Post Sedation.  Post Procedure Instructions:  The drugs you were given will stay in your system until tomorrow, so for the next 24 hours you should not drive, make any legal decisions or drink any alcoholic beverages.  You may eat anything you prefer, but it is better to start with liquids then soups and crackers, and gradually work up to solid foods.  Please notify your doctor immediately if you have any unusual bleeding, trouble breathing or pain that is not related to your normal pain.  Depending on the type of procedure that was done, some parts of your body may feel week and/or numb.  This usually clears up by tonight or the next day.  Walk with the use of an assistive device or accompanied by an adult for the 24 hours.  You may use ice on the affected area for the first 24 hours.  Put ice in a Ziploc bag and cover with a towel and place against area 15 minutes on 15 minutes off.  You may switch to heat after 24 hours.Epidural Steroid Injection Patient Information  Description: The epidural space surrounds the nerves as they exit the spinal cord.  In some patients, the nerves can be compressed and inflamed by a bulging disc or a tight spinal canal (spinal stenosis).  By injecting steroids into the epidural space, we can bring irritated nerves into direct contact with a potentially helpful medication.  These  steroids act directly on the irritated nerves and can reduce swelling and inflammation which often leads to decreased pain.  Epidural steroids may be injected anywhere along the spine and from the neck to the low back depending upon the location of your pain.   After numbing the skin with local anesthetic (like Novocaine), a small needle is passed into the epidural space slowly.  You may experience a sensation of pressure while this is being done.  The entire block usually last less than 10 minutes.  Conditions which may be treated by epidural steroids:   Low back and leg pain  Neck and arm pain  Spinal stenosis  Post-laminectomy syndrome  Herpes zoster (shingles) pain  Pain from compression fractures  Preparation for the injection:  1. Do not eat any solid food or dairy products within 8 hours of your appointment.  2. You may drink clear liquids up to 3 hours before appointment.  Clear liquids include water, black coffee, juice or soda.  No milk or cream please. 3. You may take your regular medication, including pain medications, with a sip of water before your appointment  Diabetics should hold regular insulin (if taken separately) and take 1/2 normal NPH dos the morning of the procedure.  Carry some sugar containing items with you to your appointment. 4. A driver must accompany you and be prepared to drive you home after your procedure.  5. Bring all your current medications with your. 6. An IV may be inserted and   sedation may be given at the discretion of the physician.   7. A blood pressure cuff, EKG and other monitors will often be applied during the procedure.  Some patients may need to have extra oxygen administered for a short period. 8. You will be asked to provide medical information, including your allergies, prior to the procedure.  We must know immediately if you are taking blood thinners (like Coumadin/Warfarin)  Or if you are allergic to IV iodine contrast (dye). We must  know if you could possible be pregnant.  Possible side-effects:  Bleeding from needle site  Infection (rare, may require surgery)  Nerve injury (rare)  Numbness & tingling (temporary)  Difficulty urinating (rare, temporary)  Spinal headache ( a headache worse with upright posture)  Light -headedness (temporary)  Pain at injection site (several days)  Decreased blood pressure (temporary)  Weakness in arm/leg (temporary)  Pressure sensation in back/neck (temporary)  Call if you experience:  Fever/chills associated with headache or increased back/neck pain.  Headache worsened by an upright position.  New onset weakness or numbness of an extremity below the injection site  Hives or difficulty breathing (go to the emergency room)  Inflammation or drainage at the infection site  Severe back/neck pain  Any new symptoms which are concerning to you  Please note:  Although the local anesthetic injected can often make your back or neck feel good for several hours after the injection, the pain will likely return.  It takes 3-7 days for steroids to work in the epidural space.  You may not notice any pain relief for at least that one week.  If effective, we will often do a series of three injections spaced 3-6 weeks apart to maximally decrease your pain.  After the initial series, we generally will wait several months before considering a repeat injection of the same type.  If you have any questions, please call [phone] [facility] 

## 2015-12-18 NOTE — Progress Notes (Signed)
Safety precautions to be maintained throughout the outpatient stay will include: orient to surroundings, keep bed in low position, maintain call bell within reach at all times, provide assistance with transfer out of bed and ambulation.  

## 2015-12-19 ENCOUNTER — Telehealth: Payer: Self-pay | Admitting: *Deleted

## 2015-12-19 NOTE — Telephone Encounter (Signed)
No problems post procedure. 

## 2016-01-01 ENCOUNTER — Encounter: Payer: Self-pay | Admitting: *Deleted

## 2016-01-04 ENCOUNTER — Ambulatory Visit (INDEPENDENT_AMBULATORY_CARE_PROVIDER_SITE_OTHER): Payer: Medicare Other

## 2016-01-04 DIAGNOSIS — Z23 Encounter for immunization: Secondary | ICD-10-CM | POA: Diagnosis not present

## 2016-01-07 ENCOUNTER — Encounter: Payer: Self-pay | Admitting: Family Medicine

## 2016-01-07 ENCOUNTER — Ambulatory Visit (INDEPENDENT_AMBULATORY_CARE_PROVIDER_SITE_OTHER): Payer: Medicare Other | Admitting: Family Medicine

## 2016-01-07 VITALS — BP 124/68 | HR 76 | Temp 97.7°F | Wt 132.8 lb

## 2016-01-07 DIAGNOSIS — R61 Generalized hyperhidrosis: Secondary | ICD-10-CM

## 2016-01-07 DIAGNOSIS — M542 Cervicalgia: Secondary | ICD-10-CM | POA: Insufficient documentation

## 2016-01-07 DIAGNOSIS — E039 Hypothyroidism, unspecified: Secondary | ICD-10-CM | POA: Diagnosis not present

## 2016-01-07 LAB — BASIC METABOLIC PANEL
BUN: 10 mg/dL (ref 6–23)
CO2: 31 mEq/L (ref 19–32)
Calcium: 9.4 mg/dL (ref 8.4–10.5)
Chloride: 98 mEq/L (ref 96–112)
Creatinine, Ser: 0.64 mg/dL (ref 0.40–1.20)
GFR: 95.02 mL/min (ref >60.00)
Glucose, Bld: 79 mg/dL (ref 70–99)
POTASSIUM: 3.8 meq/L (ref 3.5–5.1)
SODIUM: 135 meq/L (ref 135–145)

## 2016-01-07 LAB — CBC WITH DIFFERENTIAL/PLATELET
BASOS PCT: 0.9 % (ref 0.0–3.0)
Basophils Absolute: 0 10*3/uL (ref 0.0–0.1)
EOS ABS: 0.1 10*3/uL (ref 0.0–0.7)
Eosinophils Relative: 1.4 % (ref 0.0–5.0)
HCT: 40.9 % (ref 36.0–46.0)
Hemoglobin: 13.8 g/dL (ref 12.0–15.0)
Lymphocytes Relative: 34.3 % (ref 12.0–46.0)
Lymphs Abs: 1.7 10*3/uL (ref 0.7–4.0)
MCHC: 33.8 g/dL (ref 30.0–36.0)
MCV: 89.2 fl (ref 78.0–100.0)
MONO ABS: 0.4 10*3/uL (ref 0.1–1.0)
Monocytes Relative: 8.6 % (ref 3.0–12.0)
NEUTROS ABS: 2.7 10*3/uL (ref 1.4–7.7)
NEUTROS PCT: 54.8 % (ref 43.0–77.0)
PLATELETS: 337 10*3/uL (ref 150.0–400.0)
RBC: 4.58 Mil/uL (ref 3.87–5.11)
RDW: 15.3 % (ref 11.5–15.5)
WBC: 4.9 10*3/uL (ref 4.0–10.5)

## 2016-01-07 LAB — SEDIMENTATION RATE: SED RATE: 11 mm/h (ref 0–30)

## 2016-01-07 LAB — TSH: TSH: 4.38 u[IU]/mL (ref 0.35–4.50)

## 2016-01-07 LAB — T4, FREE: Free T4: 1.03 ng/dL (ref 0.60–1.60)

## 2016-01-07 NOTE — Assessment & Plan Note (Signed)
New and worsening over last 3 weeks.  Marked tenderness to palpation along entire R lateral neck beginning at posterior R ear. Mild edema/warmth compared to left. ?infectious cause vs neuralgia given known cervical degenerative history.  Continue lyrica and tylenol, check labs (CBC, ESR, BMP, TSH).  Check R neck CT with contrast to further evaluate for other cause like tumor or lymphoma.

## 2016-01-07 NOTE — Progress Notes (Signed)
BP 124/68   Pulse 76   Temp 97.7 F (36.5 C) (Oral)   Wt 132 lb 12 oz (60.2 kg)   BMI 25.93 kg/m    CC: new neck pain  Subjective:    Patient ID: Cheyenne Pierce, female    DOB: 03-Jul-1934, 81 y.o.   MRN: [id number]  HPI: Cheyenne Pierce is a 81 y.o. female presenting on 01/07/2016 for Neck Pain (x 3 weeks)   3 wk h/o R sided neck pain different from her chronic neck pain. Describes sudden gripping pain with any sudden head movements. Points to lateral neck at R postauricular region. No shoulder or posterior midline neck pain. Staying very sore to R anterior cervical region. Some new choking even with saliva. Also noticing 2am night sweats that wakes her up over last 3 weeks. Increased fatigue present as well. Worse pain with movement.   No fevers/chills, recent URI, cough, ear or tooth pain, ST, PNDrainage. No unexpected weight loss.   She takes lyrica and tylenol for her chronic pain, as well as has started max freeze cream to R neck.   Known degenerative spinal disease with scoliosis and cervical central canal stenosis followed by Dr [name] s/p multiple ESI and latest ACDF by Dr Annette Stable 05/2015.   Relevant past medical, surgical, family and social history reviewed and updated as indicated. Interim medical history since our last visit reviewed. Allergies and medications reviewed and updated. Current Outpatient Prescriptions on File Prior to Visit  Medication Sig  . amLODipine (NORVASC) 2.5 MG tablet Take 1 tablet (2.5 mg total) by mouth daily.  Marland Kitchen aspirin 81 MG tablet Take 81 mg by mouth. Twice a week  . Biotin 1000 MCG tablet Take 1,000 mcg by mouth daily.  . calcium-vitamin D (OSCAL WITH D) 500-200 MG-UNIT tablet Take 1 tablet by mouth.  . diphenhydrAMINE (BENADRYL) 25 mg capsule Take 25 mg by mouth at bedtime.   . hyoscyamine (LEVSIN, ANASPAZ) 0.125 MG tablet Take 1 tablet (0.125 mg total) by mouth 2 (two) times daily as needed.  . Lactobacillus Rhamnosus, GG, (CULTURELLE PO)  Take 1 capsule by mouth daily.  Marland Kitchen levothyroxine (SYNTHROID, LEVOTHROID) 50 MCG tablet take 1 tablet by mouth once daily TAKE EXTRA TABLET ON MONDAY AND FRIDAY  . loperamide (IMODIUM) 2 MG capsule Take by mouth as needed for diarrhea or loose stools.  . Magnesium 250 MG TABS Take by mouth daily.  . meclizine (ANTIVERT) 25 MG tablet take 1 tablet by mouth three times a day if needed  . Multiple Vitamins-Minerals (CENTRUM SILVER PO) Take by mouth daily.   . nitroGLYCERIN (NITROSTAT) 0.4 MG SL tablet Place 0.4 mg under the tongue every 5 (five) minutes as needed.  . pantoprazole (PROTONIX) 40 MG tablet Take 1 tablet (40 mg total) by mouth daily.  . Potassium 99 MG TABS Take 2 tablets by mouth daily.  . pregabalin (LYRICA) 75 MG capsule Take 1 capsule (75 mg total) by mouth 3 (three) times daily.  Marland Kitchen triamterene-hydrochlorothiazide (MAXZIDE-25) 37.5-25 MG tablet take 1 tablet by mouth once daily   Current Facility-Administered Medications on File Prior to Visit  Medication  . sodium chloride flush (NS) 0.9 % injection 2 mL    Review of Systems Per HPI unless specifically indicated in ROS section     Objective:    BP 124/68   Pulse 76   Temp 97.7 F (36.5 C) (Oral)   Wt 132 lb 12 oz (60.2 kg)   BMI 25.93 kg/m  Wt Readings from Last 3 Encounters:  01/07/16 132 lb 12 oz (60.2 kg)  12/18/15 125 lb (56.7 kg)  11/26/15 129 lb (58.5 kg)    Physical Exam  Constitutional: She appears well-developed and well-nourished. No distress.  HENT:  Head: Normocephalic and atraumatic.  Right Ear: Hearing, tympanic membrane, external ear and ear canal normal.  Left Ear: Hearing, tympanic membrane, external ear and ear canal normal.  Nose: Nose normal.  Mouth/Throat: Uvula is midline, oropharynx is clear and moist and mucous membranes are normal. No oropharyngeal exudate, posterior oropharyngeal edema, posterior oropharyngeal erythema or tonsillar abscesses.  No pain to palpation at temporal  arteries Point tender to light palpation from R postauricular region down lateral and anterior neck without obvious erythema or induration. Mild warmth and edema to postauricular and upper lateral neck skin R>L.  Eyes: Conjunctivae and EOM are normal. Pupils are equal, round, and reactive to light. No scleral icterus.  Neck: Normal range of motion. Neck supple. No thyromegaly present.  Musculoskeletal:  No significant midline cervical pain to palpation.  Lymphadenopathy:    She has no cervical adenopathy (none obvious).  Skin: Skin is warm and dry. No rash noted.  Nursing note and vitals reviewed.  Results for orders placed or performed in visit on 01/01/16  [facility] MAMMOGRAPHY  Result Value Ref Range   [facility] Mammogram 0-4 Bi-Rad 0-4 Bi-Rad, Self Reported Normal   Lab Results  Component Value Date   CREATININE 0.79 10/11/2015       Assessment & Plan:   Problem List Items Addressed This Visit    Hypothyroidism   Relevant Orders   TSH   T4, free   Neck pain on right side - Primary    New and worsening over last 3 weeks.  Marked tenderness to palpation along entire R lateral neck beginning at posterior R ear. Mild edema/warmth compared to left. ?infectious cause vs neuralgia given known cervical degenerative history.  Continue lyrica and tylenol, check labs (CBC, ESR, BMP, TSH).  Check R neck CT with contrast to further evaluate for other cause like tumor or lymphoma.       Relevant Orders   CBC with Differential/Platelet   Basic metabolic panel   Sedimentation rate   CT Soft Tissue Neck W Contrast   Night sweats   Relevant Orders   Basic metabolic panel   Sedimentation rate   CT Soft Tissue Neck W Contrast    Other Visit Diagnoses   None.      Follow up plan: Return if symptoms worsen or fail to improve.  Ria Bush, MD

## 2016-01-07 NOTE — Patient Instructions (Signed)
Labs today We will check neck CT scan with contrast as well. We will be in touch with results.

## 2016-01-07 NOTE — Progress Notes (Signed)
Pre visit review using our clinic review tool, if applicable. No additional management support is needed unless otherwise documented below in the visit note. 

## 2016-01-11 ENCOUNTER — Ambulatory Visit
Admission: RE | Admit: 2016-01-11 | Discharge: 2016-01-11 | Disposition: A | Discharge status: Home / Self Care | Payer: Medicare Other | Source: Ambulatory Visit | Attending: Family Medicine | Admitting: Family Medicine

## 2016-01-11 DIAGNOSIS — R221 Localized swelling, mass and lump, neck: Secondary | ICD-10-CM | POA: Diagnosis not present

## 2016-01-11 DIAGNOSIS — M542 Cervicalgia: Secondary | ICD-10-CM | POA: Diagnosis not present

## 2016-01-11 DIAGNOSIS — I700 Atherosclerosis of aorta: Secondary | ICD-10-CM | POA: Insufficient documentation

## 2016-01-11 DIAGNOSIS — M7989 Other specified soft tissue disorders: Secondary | ICD-10-CM | POA: Diagnosis not present

## 2016-01-11 DIAGNOSIS — R61 Generalized hyperhidrosis: Secondary | ICD-10-CM

## 2016-01-11 MED ORDER — IOPAMIDOL (ISOVUE-300) INJECTION 61%
75.0000 mL | Freq: Once | INTRAVENOUS | Status: AC | PRN
  Administered 2016-01-11: 75 mL via INTRAVENOUS

## 2016-01-15 ENCOUNTER — Telehealth: Payer: Self-pay

## 2016-01-15 NOTE — Telephone Encounter (Signed)
Pt left v/m requesting copy of recent labs and imaging to be picked up at front desk; pt has appt with specialist on 01/21/16. Pt voiced understanding,.

## 2016-01-21 ENCOUNTER — Ambulatory Visit: Payer: Medicare Other | Attending: Pain Medicine | Admitting: Pain Medicine

## 2016-01-21 ENCOUNTER — Encounter: Payer: Self-pay | Admitting: Pain Medicine

## 2016-01-21 VITALS — BP 143/76 | HR 67 | Temp 97.9°F | Resp 16 | Ht 60.0 in | Wt 130.0 lb

## 2016-01-21 DIAGNOSIS — E039 Hypothyroidism, unspecified: Secondary | ICD-10-CM | POA: Diagnosis not present

## 2016-01-21 DIAGNOSIS — Z90710 Acquired absence of both cervix and uterus: Secondary | ICD-10-CM | POA: Insufficient documentation

## 2016-01-21 DIAGNOSIS — M48061 Spinal stenosis, lumbar region without neurogenic claudication: Secondary | ICD-10-CM | POA: Insufficient documentation

## 2016-01-21 DIAGNOSIS — E785 Hyperlipidemia, unspecified: Secondary | ICD-10-CM | POA: Diagnosis not present

## 2016-01-21 DIAGNOSIS — Z87891 Personal history of nicotine dependence: Secondary | ICD-10-CM | POA: Diagnosis not present

## 2016-01-21 DIAGNOSIS — R200 Anesthesia of skin: Secondary | ICD-10-CM | POA: Diagnosis not present

## 2016-01-21 DIAGNOSIS — R61 Generalized hyperhidrosis: Secondary | ICD-10-CM | POA: Diagnosis not present

## 2016-01-21 DIAGNOSIS — M25551 Pain in right hip: Secondary | ICD-10-CM | POA: Insufficient documentation

## 2016-01-21 DIAGNOSIS — K219 Gastro-esophageal reflux disease without esophagitis: Secondary | ICD-10-CM | POA: Insufficient documentation

## 2016-01-21 DIAGNOSIS — M5116 Intervertebral disc disorders with radiculopathy, lumbar region: Secondary | ICD-10-CM | POA: Insufficient documentation

## 2016-01-21 DIAGNOSIS — Z888 Allergy status to other drugs, medicaments and biological substances status: Secondary | ICD-10-CM | POA: Diagnosis not present

## 2016-01-21 DIAGNOSIS — Z8249 Family history of ischemic heart disease and other diseases of the circulatory system: Secondary | ICD-10-CM | POA: Insufficient documentation

## 2016-01-21 DIAGNOSIS — R7303 Prediabetes: Secondary | ICD-10-CM | POA: Insufficient documentation

## 2016-01-21 DIAGNOSIS — K589 Irritable bowel syndrome without diarrhea: Secondary | ICD-10-CM | POA: Insufficient documentation

## 2016-01-21 DIAGNOSIS — Z981 Arthrodesis status: Secondary | ICD-10-CM | POA: Insufficient documentation

## 2016-01-21 DIAGNOSIS — M1288 Other specific arthropathies, not elsewhere classified, other specified site: Secondary | ICD-10-CM | POA: Diagnosis not present

## 2016-01-21 DIAGNOSIS — Z9842 Cataract extraction status, left eye: Secondary | ICD-10-CM | POA: Diagnosis not present

## 2016-01-21 DIAGNOSIS — Z86010 Personal history of colonic polyps: Secondary | ICD-10-CM | POA: Insufficient documentation

## 2016-01-21 DIAGNOSIS — M47892 Other spondylosis, cervical region: Secondary | ICD-10-CM | POA: Diagnosis not present

## 2016-01-21 DIAGNOSIS — I700 Atherosclerosis of aorta: Secondary | ICD-10-CM | POA: Insufficient documentation

## 2016-01-21 DIAGNOSIS — I10 Essential (primary) hypertension: Secondary | ICD-10-CM | POA: Insufficient documentation

## 2016-01-21 DIAGNOSIS — K449 Diaphragmatic hernia without obstruction or gangrene: Secondary | ICD-10-CM | POA: Insufficient documentation

## 2016-01-21 DIAGNOSIS — M545 Low back pain: Secondary | ICD-10-CM | POA: Diagnosis not present

## 2016-01-21 DIAGNOSIS — Z880 Allergy status to penicillin: Secondary | ICD-10-CM | POA: Diagnosis not present

## 2016-01-21 DIAGNOSIS — M5412 Radiculopathy, cervical region: Secondary | ICD-10-CM | POA: Diagnosis not present

## 2016-01-21 DIAGNOSIS — Z91011 Allergy to milk products: Secondary | ICD-10-CM | POA: Diagnosis not present

## 2016-01-21 DIAGNOSIS — Z9841 Cataract extraction status, right eye: Secondary | ICD-10-CM | POA: Insufficient documentation

## 2016-01-21 DIAGNOSIS — Z823 Family history of stroke: Secondary | ICD-10-CM | POA: Insufficient documentation

## 2016-01-21 DIAGNOSIS — Z7982 Long term (current) use of aspirin: Secondary | ICD-10-CM | POA: Diagnosis not present

## 2016-01-21 DIAGNOSIS — Z8719 Personal history of other diseases of the digestive system: Secondary | ICD-10-CM | POA: Insufficient documentation

## 2016-01-21 DIAGNOSIS — Z885 Allergy status to narcotic agent status: Secondary | ICD-10-CM | POA: Insufficient documentation

## 2016-01-21 DIAGNOSIS — G8929 Other chronic pain: Secondary | ICD-10-CM | POA: Diagnosis not present

## 2016-01-21 DIAGNOSIS — M47812 Spondylosis without myelopathy or radiculopathy, cervical region: Secondary | ICD-10-CM

## 2016-01-21 DIAGNOSIS — M542 Cervicalgia: Secondary | ICD-10-CM | POA: Insufficient documentation

## 2016-01-21 NOTE — Progress Notes (Signed)
Patient's Name: Cheyenne Pierce  MRN: [id number]  Referring Provider: Ria Bush, MD  DOB: 1934/12/18  PCP: Ria Bush, MD  DOS: 01/21/2016  Note by: Kathlen Brunswick. [name], MD  Service setting: Ambulatory outpatient  Specialty: Interventional Pain Management  Location: [facility] (AMB) Pain Management Facility    Patient type: Established   Primary Reason(s) for Visit: Encounter for post-procedure evaluation of chronic illness with mild to moderate exacerbation CC: Back Pain (lower) and Neck Pain (right  radiates to shoulder)  HPI  Cheyenne Pierce is a 81 y.o. year old, female patient, who comes today for an initial evaluation. She has Hypothyroidism; GAD (generalized anxiety disorder); Hereditary and idiopathic peripheral neuropathy; Essential hypertension; Reflux esophagitis; GERD; INGUINAL HERNIA, LEFT; Irritable bowel syndrome; OTHER CHRONIC CYSTITIS; Osteopenia; Systolic murmur; Medicare annual wellness visit, subsequent; PVCs (premature ventricular contractions); Prediabetes; Mood swings ([place]); Memory deficit; Advanced care planning/counseling discussion; HLD (hyperlipidemia); Health maintenance examination; Recurrent falls; Night sweats; Chronic pain syndrome; Encounter for therapeutic drug level monitoring; Encounter for chronic pain management; Chronic low back pain; Chronic neck pain (R>L); Cervical spondylosis; Chronic cervical radicular pain (Bilateral) (R>L); Numbness of upper extremity (Bilateral) (R>L); Upper extremity weakness (Bilateral); Neurogenic pain; Neuropathic pain; Inflammatory pain; Chronic Lumbar Radicular pain (Left); Allodynia; Encounter for long-term current use of medication; Lumbar spondylosis; Chronic Lumbar Radiculopathy (Left); Chronic lower extremity pain (Bilateral) (L>R); Chronic hip pain (Right); Abnormal Cervical MRI (01/24/15); Cervical spinal stenosis (C3 through C7); Cervical facet hypertrophy; Cervical foraminal stenosis (C3-4 through C6-7) (R>L); Chronic  cervical radiculopathy (Bilateral) (R>L) (pain, numbness, and weakness); Abnormal Lumbar MRI (11/10/2014); Lumbar spinal stenosis (L3-4); Lumbar foraminal stenosis (Bilateral L3-4) (Right L4-5); MDD (major depressive disorder), recurrent episode, moderate ([place]); Fibromyalgia; Neck pain on right side; and Cervical facet syndrome on her problem list.. Her primarily concern today is the Back Pain (lower) and Neck Pain (right  radiates to shoulder)  Pain Assessment: Self-Reported Pain Score: 4 /10             Reported level is compatible with observation.       Pain Location: Back Pain Orientation: Lower Pain Descriptors / Indicators: Burning, Aching Pain Frequency: Constant  [name] comes in today for post-procedure evaluation after the treatment done on 12/18/2015.  Post-Procedure Assessment  Procedure done on 12/18/2015: Palliative left L4-5 interlaminar lumbar epidural steroid injection under fluoroscopic guidance, no sedation. Complications experienced at the time of the procedure: None Side-effects or Adverse reactions: None reported Sedation: No sedation used. When no sedatives are used, the analgesic levels obtained are directly associated with the effectiveness of the local anesthetics. On the other hand, when sedation is provided, the level of analgesia obtained during the initial 1 hour, immediately following the intervention, is believed to be the result of a combination of factors. These factors may include, but are not limited to: 1. The effectiveness of the local anesthetics used. 2. The effects of the analgesic(s) and/or anxiolytic(s) used. 3. The degree of discomfort experienced by the patient at the time of the procedure. 4. The patients ability and reliability in recalling and recording the events. 5. The presence and influence of possible secondary gains. Results: Relief during the 1st hour after the procedure: 90 % (pt states she was numb) (Ultra-Short Term  Relief) Interpretative note: No IV Analgesics or Anxiolytics given, therefore the benefit is completely due to Local Anesthetics Local Anesthesia: Long-acting (4-6 hours) anesthetics used. The analgesic levels attained during this period are directly associated to the localized infiltration of local anesthetics and therefore  cary significant diagnostic value as to the etiological location or origin of the pain. Results: Relief during the next 4 to 6 hour after the procedure: 75 % (Short Term Relief) Interpretative note: Complete relief confirms area to be the source of pain Long-Term Therapy: Steroids used. Results: Extended relief following procedure: 95 % (Pain better during day than night) Interpretative note: Long-term benefit would suggest an inflammatory etiology to the pain.         Long-Term Benefits:  Current Relief (Now): 90%  Interpretative note: Ongoing improvement of symptoms would suggest either persistent anti-inflammatory benefits or prolonged sympathectomy, in the case of sympathetically-mediated pain Interpretation of Results: The results would suggest therapy to be positively impacting the patient's condition.         Laboratory Chemistry  Inflammation Markers Lab Results  Component Value Date   ESRSEDRATE 11 01/07/2016   CRP 0.7 09/12/2014   Renal Function Lab Results  Component Value Date   BUN 10 01/07/2016   CREATININE 0.64 01/07/2016   GFRAA >60 05/22/2015   GFRNONAA >60 05/22/2015   Hepatic Function Lab Results  Component Value Date   AST 27 02/07/2015   ALT 21 02/07/2015   ALBUMIN 4.0 02/07/2015   Electrolytes Lab Results  Component Value Date   NA 135 01/07/2016   K 3.8 01/07/2016   CL 98 01/07/2016   CALCIUM 9.4 01/07/2016   Pain Modulating Vitamins Lab Results  Component Value Date   VD25OH 47.39 10/04/2013   VITAMINB12 >1500 (H) 09/22/2012   Coagulation Parameters Lab Results  Component Value Date   INR 1.0 11/08/2012   LABPROT 13.8  11/08/2012   APTT 32.6 11/08/2012   PLT 337.0 01/07/2016   Cardiovascular Lab Results  Component Value Date   HGB 13.8 01/07/2016   HCT 40.9 01/07/2016   Note: Lab results reviewed.  Recent Diagnostic Imaging Review  Ct Soft Tissue Neck W Contrast Result Date: 01/11/2016 CLINICAL DATA:  Right-sided neck pain radiating to the right ear for 2-3 weeks. Right neck swelling. Night sweats. Cervical spine fusion 05/2015. EXAM: CT NECK WITH CONTRAST TECHNIQUE: Multidetector CT imaging of the neck was performed using the standard protocol following the bolus administration of intravenous contrast. CONTRAST:  49mL ISOVUE-300 IOPAMIDOL (ISOVUE-300) INJECTION 61% COMPARISON:  Cervical spine CT 09/26/2015. CTA neck 11/08/2012. Soft tissue neck CT 05/05/2006. FINDINGS: Pharynx and larynx: No evidence of pharyngeal mass or inflammation within limitations of dental and cervical spine streak artifact. Unremarkable larynx. Salivary glands: Submandibular and parotid glands are unremarkable. Thyroid: Normal in size without mass identified. Posterior thyroid partially obscured by streak artifact. Lymph nodes: No enlarged lymph nodes are identified in the neck. Mildly prominent left subpectoral lymph nodes measure up to 1 cm in short axis, unchanged from the prior cervical spine CT and nonspecific. Vascular: Major vascular structures of the neck appear patent, although portions of the carotid and vertebral arteries rae obscured by streak artifact. Mild aortic arch atherosclerosis. Limited intracranial: Unremarkable. Visualized orbits: Prior bilateral cataract extraction. Mastoids and visualized paranasal sinuses: Clear. Skeleton: Sequelae of C3-C7 ACDF are again identified. No prevertebral fluid collection. Upper chest: Clear lung apices. Other: No right-sided neck mass, fluid collection, or inflammatory change. IMPRESSION: 1. No etiology of right-sided neck pain or swelling identified in the soft tissues. 2. C3-C7 ACDF.   No fluid collection. 3. Aortic atherosclerosis. Electronically Signed   By: Logan Bores M.D.   On: 01/11/2016 17:06   Meds  The patient has a current medication list which includes the  following prescription(s): amlodipine, aspirin, astragalus, biotin, calcium-vitamin d, diphenhydramine, hyoscyamine, lactobacillus rhamnosus (gg), levothyroxine, loperamide, magnesium, meclizine, multiple vitamins-minerals, nitroglycerin, pantoprazole, potassium, pregabalin, and triamterene-hydrochlorothiazide.  Current Outpatient Prescriptions on File Prior to Visit  Medication Sig  . amLODipine (NORVASC) 2.5 MG tablet Take 1 tablet (2.5 mg total) by mouth daily.  Marland Kitchen aspirin 81 MG tablet Take 81 mg by mouth. Twice a week  . ASTRAGALUS PO Take 500 mg by mouth daily.  . Biotin 1000 MCG tablet Take 1,000 mcg by mouth daily.  . calcium-vitamin D (OSCAL WITH D) 500-200 MG-UNIT tablet Take 1 tablet by mouth.  . diphenhydrAMINE (BENADRYL) 25 mg capsule Take 25 mg by mouth at bedtime.   . hyoscyamine (LEVSIN, ANASPAZ) 0.125 MG tablet Take 1 tablet (0.125 mg total) by mouth 2 (two) times daily as needed.  . Lactobacillus Rhamnosus, GG, (CULTURELLE PO) Take 1 capsule by mouth daily.  Marland Kitchen levothyroxine (SYNTHROID, LEVOTHROID) 50 MCG tablet take 1 tablet by mouth once daily TAKE EXTRA TABLET ON MONDAY AND FRIDAY  . loperamide (IMODIUM) 2 MG capsule Take by mouth as needed for diarrhea or loose stools.  . Magnesium 250 MG TABS Take by mouth daily.  . meclizine (ANTIVERT) 25 MG tablet take 1 tablet by mouth three times a day if needed  . Multiple Vitamins-Minerals (CENTRUM SILVER PO) Take by mouth daily.   . nitroGLYCERIN (NITROSTAT) 0.4 MG SL tablet Place 0.4 mg under the tongue every 5 (five) minutes as needed.  . pantoprazole (PROTONIX) 40 MG tablet Take 1 tablet (40 mg total) by mouth daily.  . Potassium 99 MG TABS Take 2 tablets by mouth daily.  . pregabalin (LYRICA) 75 MG capsule Take 1 capsule (75 mg total) by mouth 3  (three) times daily.  Marland Kitchen triamterene-hydrochlorothiazide (MAXZIDE-25) 37.5-25 MG tablet take 1 tablet by mouth once daily   No current facility-administered medications on file prior to visit.    ROS  Constitutional: Denies any fever or chills Gastrointestinal: No reported hemesis, hematochezia, vomiting, or acute GI distress Musculoskeletal: Denies any acute onset joint swelling, redness, loss of ROM, or weakness Neurological: No reported episodes of acute onset apraxia, aphasia, dysarthria, agnosia, amnesia, paralysis, loss of coordination, or loss of consciousness  Allergies  Ms. Kitchens is allergic to codeine; penicillins; prednisone; zoloft [sertraline hcl]; amoxicillin; codeine phosphate; lactose intolerance (gi); metronidazole; perindopril erbumine; potassium-containing compounds; prozac [fluoxetine hcl]; tramadol hcl; and oxcarbazepine.  PFSH  Drug: Ms. Gensheimer  reports that she does not use drugs. Alcohol:  reports that she does not drink alcohol. Tobacco:  reports that she has quit smoking. She has a 30.00 pack-year smoking history. She has never used smokeless tobacco. Medical:  has a past medical history of Allergy; Angina pectoris ([place]); Anxiety; Cataract; Chronic back pain; DDD (degenerative disc disease), cervical; DDD (degenerative disc disease), cervical; DDD (degenerative disc disease), lumbar; Dental bridge present; Depression; Diverticulosis; GERD (gastroesophageal reflux disease); Headache; Heart murmur; History of bronchitis; History of colon polyps; History of gastric ulcer; History of hiatal hernia; Hypertension; Hypothyroidism; IBS (irritable bowel syndrome); Insomnia; Joint pain; Joint swelling; Mass of vagina; Nocturia; Peripheral neuropathy ([place]); Peripheral neuropathy ([place]); Urinary frequency; Urinary urgency; and Vertigo. Family: family history includes Heart disease in her mother; Stroke in her brother.  Past Surgical History:  Procedure Laterality Date  . ABDOMINAL  HYSTERECTOMY  1968   left ovary remains  . ANTERIOR CERVICAL DECOMPRESSION/DISCECTOMY FUSION 4 LEVELS N/A 06/05/2015   Procedure: ANTERIOR CERVICAL DECOMPRESSION/DISCECTOMY FUSION CERVICAL THREE-FOUR,CERVICAL FOUR-FIVE,CERVICAL FIVE-SIX,CERVICAL SIX-SEVEN;  Surgeon: Mallie Mussel  Pool, MD;  Location: [facility];  Service: Neurosurgery;  Laterality: N/A;  right side approach  . APPENDECTOMY  1948  . bladder suspension with vaginal sling    . CARDIAC CATHETERIZATION  09/26/1998  . CATARACT EXTRACTION W/PHACO Left 10/15/2015   Procedure: CATARACT EXTRACTION PHACO AND INTRAOCULAR LENS PLACEMENT (IOC) LEFT EYE;  Surgeon: Ronnell Freshwater, MD;  Location: [facility];  Service: Ophthalmology;  Laterality: Left;  LEFT LEAVE PT LAST  . CATARACT EXTRACTION W/PHACO Right 11/26/2015   Procedure: CATARACT EXTRACTION PHACO AND INTRAOCULAR LENS PLACEMENT (IOC);  Surgeon: Ronnell Freshwater, MD;  Location: [facility];  Service: Ophthalmology;  Laterality: Right;  RIGHT  . COLONOSCOPY  07/2009   [facility]), normal per pt  . COLONOSCOPY  06/2014   int hem, rpt 5 yrs ([name])  . COLOSTOMY W/ RECTOCELE REPAIR  06/2004  . DEXA  2013   T -1.0  . epidural infections    . epidural steroid injections  multiple latest 05/2014, 07/2014   help periph neuropathy [name])  . ESOPHAGOGASTRODUODENOSCOPY    . EYE SURGERY Left 10/15/2015   cataract removal  . HERNIA REPAIR    . NM MYOVIEW LTD  2009   WNL per report  . OVARIAN CYST SURGERY  1963   cyst on ovaries  . TONSILLECTOMY    . US ECHOCARDIOGRAPHY  2009   mild aortic/mitral insuff, ER [id number], mild diastolic dysfunction  . VENTRAL HERNIA REPAIR  08/2008   abdominal wall, lysis of adhesions   Constitutional Exam  General appearance: Well nourished, well developed, and well hydrated. In no apparent acute distress Vitals:   01/21/16 1115  BP: (!) 143/76  Pulse: 67  Resp: 16  Temp: 97.9 F (36.6 C)  SpO2: 100%  Weight: 130 lb (59 kg)   Height: 5' (1.524 m)   BMI Assessment: Estimated body mass index is 25.39 kg/m as calculated from the following:   Height as of this encounter: 5' (1.524 m).   Weight as of this encounter: 130 lb (59 kg).  BMI interpretation table: BMI level Category Range association with higher incidence of chronic pain  <18 kg/m2 Underweight   18.5-24.9 kg/m2 Ideal body weight   25-29.9 kg/m2 Overweight Increased incidence by 20%  30-34.9 kg/m2 Obese (Class I) Increased incidence by 68%  35-39.9 kg/m2 Severe obesity (Class II) Increased incidence by 136%  >40 kg/m2 Extreme obesity (Class III) Increased incidence by 254%   BMI Readings from Last 4 Encounters:  01/21/16 25.39 kg/m  01/07/16 25.93 kg/m  12/18/15 24.41 kg/m  11/26/15 25.19 kg/m   Wt Readings from Last 4 Encounters:  01/21/16 130 lb (59 kg)  01/07/16 132 lb 12 oz (60.2 kg)  12/18/15 125 lb (56.7 kg)  11/26/15 129 lb (58.5 kg)  Psych/Mental status: Alert, oriented x 3 (person, place, & time) Eyes: PERLA Respiratory: No evidence of acute respiratory distress  Cervical Spine Exam  Inspection: No masses, redness, or swelling Alignment: Symmetrical Functional ROM: Unrestricted ROM Stability: No instability detected Muscle strength & Tone: Functionally intact Sensory: Unimpaired Palpation: Non-contributory  Upper Extremity (UE) Exam    Side: Right upper extremity  Side: Left upper extremity  Inspection: No masses, redness, swelling, or asymmetry  Inspection: No masses, redness, swelling, or asymmetry  Functional ROM: Unrestricted ROM         Functional ROM: Unrestricted ROM          Muscle strength & Tone: Functionally intact  Muscle strength & Tone: Functionally intact  Sensory: Unimpaired  Sensory: Unimpaired  Palpation: Non-contributory  Palpation: Non-contributory   Thoracic Spine Exam  Inspection: No masses, redness, or swelling Alignment: Symmetrical Functional ROM: Unrestricted ROM Stability: No instability  detected Sensory: Unimpaired Muscle strength & Tone: Functionally intact Palpation: Non-contributory  Lumbar Spine Exam  Inspection: No masses, redness, or swelling Alignment: Symmetrical Functional ROM: Unrestricted ROM Stability: No instability detected Muscle strength & Tone: Functionally intact Sensory: Unimpaired Palpation: Non-contributory Provocative Tests: Lumbar Hyperextension and rotation test: evaluation deferred today       Patrick's Maneuver: evaluation deferred today              Gait & Posture Assessment  Ambulation: Unassisted Gait: Relatively normal for age and body habitus Posture: WNL   Lower Extremity Exam    Side: Right lower extremity  Side: Left lower extremity  Inspection: No masses, redness, swelling, or asymmetry  Inspection: No masses, redness, swelling, or asymmetry  Functional ROM: Unrestricted ROM          Functional ROM: Unrestricted ROM          Muscle strength & Tone: Functionally intact  Muscle strength & Tone: Functionally intact  Sensory: Unimpaired  Sensory: Unimpaired  Palpation: Non-contributory  Palpation: Non-contributory   Assessment & Plan  Primary Diagnosis & Pertinent Problem List: The primary encounter diagnosis was Cervical facet hypertrophy. Diagnoses of Cervical facet syndrome and Chronic neck pain (R>L) were also pertinent to this visit.  Visit Diagnosis: 1. Cervical facet hypertrophy   2. Cervical facet syndrome   3. Chronic neck pain (R>L)    Problems updated and reviewed during this visit: Problem  Cervical Facet Syndrome   Problem-specific Plan(s): No problem-specific Assessment & Plan notes found for this encounter.  No new Assessment & Plan notes have been filed under this hospital service since the last note was generated. Service: Pain Management  Plan of Care  Pharmacotherapy (Medications Ordered): No orders of the defined types were placed in this encounter.  New Prescriptions   No medications on file    Medications administered during this visit: Ms. Gendreau had no medications administered during this visit. Lab-work, Procedure(s), & Referral(s) Ordered: Orders Placed This Encounter  Procedures  . CERVICAL FACET (MEDIAL BRANCH NERVE BLOCK)    Imaging & Referral(s) Ordered: None  Interventional Therapies: Pending/Scheduled/Planned:   Right cervical facet block under fluoro and IV sedation.   Considering:   Right cervical facet block under fluoro and IV sedation. PRN Palliative right cervical epidural steroid injections under fluoroscopic guidance, with a without sedation.  PRN Palliative left L4-5 lumbar epidural steroid injection under fluoroscopic guidance, without sedation    PRN Procedures:   PRN Palliative right cervical epidural steroid injections under fluoroscopic guidance, with a without sedation.   PRN Palliative left L4-5 lumbar epidural steroid injection under fluoroscopic guidance, without sedation    Requested PM Follow-up: Return for Schedule Procedure, (ASAA).  Future Appointments Date Time Provider [place]  01/29/2016 1:30 PM Milinda Pointer, MD [facility] None  04/18/2016 12:00 PM Ria Bush, MD [id number]  05/01/2016 1:00 PM Milinda Pointer, MD [facility] None   Primary Care Physician: Ria Bush, MD Location: [facility] Note by: Kathlen Brunswick. [name], M.D, DABA, DABAPM, DABPM, DABIPP, FIPP  Pain Score Disclaimer: We use the NRS-11 scale. This is a self-reported, subjective measurement of pain severity with only modest accuracy. It is used primarily to identify changes within a particular patient. It must be understood that outpatient pain scales are significantly less accurate  that those used for research, where they can be applied under ideal controlled circumstances with minimal exposure to variables. In reality, the score is likely to be a combination of pain intensity and pain affect, where  pain affect describes the degree of emotional arousal or changes in action readiness caused by the sensory experience of pain. Factors such as social and work situation, setting, emotional state, anxiety levels, expectation, and prior pain experience may influence pain perception and show large inter-individual differences that may also be affected by time variables.  Patient instructions provided during this appointment: Patient Instructions   Facet Blocks Patient Information  Description: The facets are joints in the spine between the vertebrae.  Like any joints in the body, facets can become irritated and painful.  Arthritis can also effect the facets.  By injecting steroids and local anesthetic in and around these joints, we can temporarily block the nerve supply to them.  Steroids act directly on irritated nerves and tissues to reduce selling and inflammation which often leads to decreased pain.  Facet blocks may be done anywhere along the spine from the neck to the low back depending upon the location of your pain.   After numbing the skin with local anesthetic (like Novocaine), a small needle is passed onto the facet joints under x-Pierce guidance.  You may experience a sensation of pressure while this is being done.  The entire block usually lasts about 15-25 minutes.   Conditions which may be treated by facet blocks:   Low back/buttock pain  Neck/shoulder pain  Certain types of headaches  Preparation for the injection:  1. Do not eat any solid food or dairy products within 8 hours of your appointment. 2. You may drink clear liquid up to 3 hours before appointment.  Clear liquids include water, black coffee, juice or soda.  No milk or cream please. 3. You may take your regular medication, including pain medications, with a sip of water before your appointment.  Diabetics should hold regular insulin (if taken separately) and take 1/2 normal NPH dose the morning of the procedure.  Carry  some sugar containing items with you to your appointment. 4. A driver must accompany you and be prepared to drive you home after your procedure. 5. Bring all your current medications with you. 6. An IV may be inserted and sedation may be given at the discretion of the physician. 7. A blood pressure cuff, EKG and other monitors will often be applied during the procedure.  Some patients may need to have extra oxygen administered for a short period. 8. You will be asked to provide medical information, including your allergies and medications, prior to the procedure.  We must know immediately if you are taking blood thinners (like Coumadin/Warfarin) or if you are allergic to IV iodine contrast (dye).  We must know if you could possible be pregnant.  Possible side-effects:   Bleeding from needle site  Infection (rare, may require surgery)  Nerve injury (rare)  Numbness & tingling (temporary)  Difficulty urinating (rare, temporary)  Spinal headache (a headache worse with upright posture)  Light-headedness (temporary)  Pain at injection site (serveral days)  Decreased blood pressure (rare, temporary)  Weakness in arm/leg (temporary)  Pressure sensation in back/neck (temporary)   Call if you experience:   Fever/chills associated with headache or increased back/neck pain  Headache worsened by an upright position  New onset, weakness or numbness of an extremity below the injection site  Hives or difficulty breathing (go  to the emergency room)  Inflammation or drainage at the injection site(s)  Severe back/neck pain greater than usual  New symptoms which are concerning to you  Please note:  Although the local anesthetic injected can often make your back or neck feel good for several hours after the injection, the pain will likely return. It takes 3-7 days for steroids to work.  You may not notice any pain relief for at least one week.  If effective, we will often do a  series of 2-3 injections spaced 3-6 weeks apart to maximally decrease your pain.  After the initial series, you may be a candidate for a more permanent nerve block of the facets.  If you have any questions, please call #[phone] [place]  What are the risk, side effects and possible complications? Generally speaking, most procedures are safe.  However, with any procedure there are risks, side effects, and the possibility of complications.  The risks and complications are dependent upon the sites that are lesioned, or the type of nerve block to be performed.  The closer the procedure is to the spine, the more serious the risks are.  Great care is taken when placing the radio frequency needles, block needles or lesioning probes, but sometimes complications can occur. 1. Infection: Any time there is an injection through the skin, there is a risk of infection.  This is why sterile conditions are used for these blocks.  There are four possible types of infection. 1. Localized skin infection. 2. Central Nervous System Infection-This can be in the form of Meningitis, which can be deadly. 3. Epidural Infections-This can be in the form of an epidural abscess, which can cause pressure inside of the spine, causing compression of the spinal cord with subsequent paralysis. This would require an emergency surgery to decompress, and there are no guarantees that the patient would recover from the paralysis. 4. Discitis-This is an infection of the intervertebral discs.  It occurs in about 1% of discography procedures.  It is difficult to treat and it may lead to surgery.        2. Pain: the needles have to go through skin and soft tissues, will cause soreness.       3. Damage to internal structures:  The nerves to be lesioned may be near blood vessels or    other nerves which can be potentially damaged.       4. Bleeding: Bleeding is more common  if the patient is taking blood thinners such as  aspirin, Coumadin, Ticiid, Plavix, etc., or if he/she have some genetic predisposition  such as hemophilia. Bleeding into the spinal canal can cause compression of the spinal  cord with subsequent paralysis.  This would require an emergency surgery to  decompress and there are no guarantees that the patient would recover from the  paralysis.       5. Pneumothorax:  Puncturing of a lung is a possibility, every time a needle is introduced in  the area of the chest or upper back.  Pneumothorax refers to free air around the  collapsed lung(s), inside of the thoracic cavity (chest cavity).  Another two possible  complications related to a similar event would include: Hemothorax and Chylothorax.   These are variations of the Pneumothorax, where instead of air around the collapsed  lung(s), you may have blood or chyle, respectively.       6. Spinal headaches: They may occur with any  procedures in the area of the spine.       7. Persistent CSF (Cerebro-Spinal Fluid) leakage: This is a rare problem, but may occur  with prolonged intrathecal or epidural catheters either due to the formation of a fistulous  track or a dural tear.       8. Nerve damage: By working so close to the spinal cord, there is always a possibility of  nerve damage, which could be as serious as a permanent spinal cord injury with  paralysis.       9. Death:  Although rare, severe deadly allergic reactions known as "Anaphylactic  reaction" can occur to any of the medications used.      10. Worsening of the symptoms:  We can always make thing worse.  What are the chances of something like this happening? Chances of any of this occuring are extremely low.  By statistics, you have more of a chance of getting killed in a motor vehicle accident: while driving to the hospital than any of the above occurring .  Nevertheless, you should be aware that they are possibilities.  In general, it is similar to  taking a shower.  Everybody knows that you can slip, hit your head and get killed.  Does that mean that you should not shower again?  Nevertheless always keep in mind that statistics do not mean anything if you happen to be on the wrong side of them.  Even if a procedure has a 1 (one) in a 1,000,000 (million) chance of going wrong, it you happen to be that one..Also, keep in mind that by statistics, you have more of a chance of having something go wrong when taking medications.  Who should not have this procedure? If you are on a blood thinning medication (e.g. Coumadin, Plavix, see list of "Blood Thinners"), or if you have an active infection going on, you should not have the procedure.  If you are taking any blood thinners, please inform your physician.  How should I prepare for this procedure?  Do not eat or drink anything at least six hours prior to the procedure.  Bring a driver with you .  It cannot be a taxi.  Come accompanied by an adult that can drive you back, and that is strong enough to help you if your legs get weak or numb from the local anesthetic.  Take all of your medicines the morning of the procedure with just enough water to swallow them.  If you have diabetes, make sure that you are scheduled to have your procedure done first thing in the morning, whenever possible.  If you have diabetes, take only half of your insulin dose and notify our nurse that you have done so as soon as you arrive at the clinic.  If you are diabetic, but only take blood sugar pills (oral hypoglycemic), then do not take them on the morning of your procedure.  You may take them after you have had the procedure.  Do not take aspirin or any aspirin-containing medications, at least eleven (11) days prior to the procedure.  They may prolong bleeding.  Wear loose fitting clothing that may be easy to take off and that you would not mind if it got stained with Betadine or blood.  Do not wear any jewelry or  perfume  Remove any nail coloring.  It will interfere with some of our monitoring equipment.  NOTE: Remember that this is not meant to be interpreted as a complete  list of all possible complications.  Unforeseen problems may occur.  BLOOD THINNERS The following drugs contain aspirin or other products, which can cause increased bleeding during surgery and should not be taken for 2 weeks prior to and 1 week after surgery.  If you should need take something for relief of minor pain, you may take acetaminophen which is found in Tylenol,m Datril, Anacin-3 and Panadol. It is not blood thinner. The products listed below are.  Do not take any of the products listed below in addition to any listed on your instruction sheet.  A.P.C or A.P.C with Codeine Codeine Phosphate Capsules #3 Ibuprofen Ridaura  ABC compound Congesprin Imuran rimadil  Advil Cope Indocin Robaxisal  Alka-Seltzer Effervescent Pain Reliever and Antacid Coricidin or Coricidin-D  Indomethacin Rufen  Alka-Seltzer plus Cold Medicine Cosprin Ketoprofen S-A-C Tablets  Anacin Analgesic Tablets or Capsules Coumadin Korlgesic Salflex  Anacin Extra Strength Analgesic tablets or capsules CP-2 Tablets Lanoril Salicylate  Anaprox Cuprimine Capsules Levenox Salocol  Anexsia-D Dalteparin Magan Salsalate  Anodynos Darvon compound Magnesium Salicylate Sine-off  Ansaid Dasin Capsules Magsal Sodium Salicylate  Anturane Depen Capsules Marnal Soma  APF Arthritis pain formula Dewitt's Pills Measurin Stanback  Argesic Dia-Gesic Meclofenamic Sulfinpyrazone  Arthritis Bayer Timed Release Aspirin Diclofenac Meclomen Sulindac  Arthritis pain formula Anacin Dicumarol Medipren Supac  Analgesic (Safety coated) Arthralgen Diffunasal Mefanamic Suprofen  Arthritis Strength Bufferin Dihydrocodeine Mepro Compound Suprol  Arthropan liquid Dopirydamole Methcarbomol with Aspirin Synalgos  ASA tablets/Enseals Disalcid Micrainin Tagament  Ascriptin Doan's Midol  Talwin  Ascriptin A/D Dolene Mobidin Tanderil  Ascriptin Extra Strength Dolobid Moblgesic Ticlid  Ascriptin with Codeine Doloprin or Doloprin with Codeine Momentum Tolectin  Asperbuf Duoprin Mono-gesic Trendar  Aspergum Duradyne Motrin or Motrin IB Triminicin  Aspirin plain, buffered or enteric coated Durasal Myochrisine Trigesic  Aspirin Suppositories Easprin Nalfon Trillsate  Aspirin with Codeine Ecotrin Regular or Extra Strength Naprosyn Uracel  Atromid-S Efficin Naproxen Ursinus  Auranofin Capsules Elmiron Neocylate Vanquish  Axotal Emagrin Norgesic Verin  Azathioprine Empirin or Empirin with Codeine Normiflo Vitamin E  Azolid Emprazil Nuprin Voltaren  Bayer Aspirin plain, buffered or children's or timed BC Tablets or powders Encaprin Orgaran Warfarin Sodium  Buff-a-Comp Enoxaparin Orudis Zorpin  Buff-a-Comp with Codeine Equegesic Os-Cal-Gesic   Buffaprin Excedrin plain, buffered or Extra Strength Oxalid   Bufferin Arthritis Strength Feldene Oxphenbutazone   Bufferin plain or Extra Strength Feldene Capsules Oxycodone with Aspirin   Bufferin with Codeine Fenoprofen Fenoprofen Pabalate or Pabalate-SF   Buffets II Flogesic Panagesic   Buffinol plain or Extra Strength Florinal or Florinal with Codeine Panwarfarin   Buf-Tabs Flurbiprofen Penicillamine   Butalbital Compound Four-way cold tablets Penicillin   Butazolidin Fragmin Pepto-Bismol   Carbenicillin Geminisyn Percodan   Carna Arthritis Reliever Geopen Persantine   Carprofen Gold's salt Persistin   Chloramphenicol Goody's Phenylbutazone   Chloromycetin Haltrain Piroxlcam   Clmetidine heparin Plaquenil   Cllnoril Hyco-pap Ponstel   Clofibrate Hydroxy chloroquine Propoxyphen         Before stopping any of these medications, be sure to consult the physician who ordered them.  Some, such as Coumadin (Warfarin) are ordered to prevent or treat serious conditions such as "deep thrombosis", "pumonary embolisms", and other heart  problems.  The amount of time that you may need off of the medication may also vary with the medication and the reason for which you were taking it.  If you are taking any of these medications, please make sure you notify your pain physician before you undergo  any procedures.

## 2016-01-21 NOTE — Patient Instructions (Signed)

## 2016-01-21 NOTE — Progress Notes (Signed)
Safety precautions to be maintained throughout the outpatient stay will include: orient to surroundings, keep bed in low position, maintain call bell within reach at all times, provide assistance with transfer out of bed and ambulation.   Remaining pills: Lyrica75mg  24/90  Filled9/19/17  Pt having severe pain in right side of neck and shoulder  Had CT 01/11/16  In Epic

## 2016-01-29 ENCOUNTER — Encounter: Payer: Self-pay | Admitting: Pain Medicine

## 2016-01-29 ENCOUNTER — Ambulatory Visit (HOSPITAL_BASED_OUTPATIENT_CLINIC_OR_DEPARTMENT_OTHER): Payer: Medicare Other | Admitting: Pain Medicine

## 2016-01-29 ENCOUNTER — Ambulatory Visit
Admission: RE | Admit: 2016-01-29 | Discharge: 2016-01-29 | Disposition: A | Discharge status: Home / Self Care | Payer: Medicare Other | Source: Ambulatory Visit | Attending: Pain Medicine | Admitting: Pain Medicine

## 2016-01-29 VITALS — BP 137/64 | HR 64 | Temp 98.1°F | Resp 14 | Ht 60.0 in | Wt 130.0 lb

## 2016-01-29 DIAGNOSIS — M1288 Other specific arthropathies, not elsewhere classified, other specified site: Secondary | ICD-10-CM

## 2016-01-29 DIAGNOSIS — M47892 Other spondylosis, cervical region: Secondary | ICD-10-CM

## 2016-01-29 DIAGNOSIS — M542 Cervicalgia: Secondary | ICD-10-CM | POA: Diagnosis not present

## 2016-01-29 DIAGNOSIS — M47812 Spondylosis without myelopathy or radiculopathy, cervical region: Secondary | ICD-10-CM | POA: Diagnosis not present

## 2016-01-29 DIAGNOSIS — M25511 Pain in right shoulder: Secondary | ICD-10-CM | POA: Diagnosis not present

## 2016-01-29 MED ORDER — ROPIVACAINE HCL 2 MG/ML IJ SOLN: 9.0000 mL | Freq: Once | INTRAMUSCULAR | Status: DC

## 2016-01-29 MED ORDER — ROPIVACAINE HCL 2 MG/ML IJ SOLN
INTRAMUSCULAR | Status: AC
  Administered 2016-01-29: 14:00:00
  Filled 2016-01-29: qty 10

## 2016-01-29 MED ORDER — MIDAZOLAM HCL 5 MG/5ML IJ SOLN: 1.0000 mg | INTRAMUSCULAR | Status: DC | PRN

## 2016-01-29 MED ORDER — LIDOCAINE HCL (PF) 1 % IJ SOLN
INTRAMUSCULAR | Status: AC
  Administered 2016-01-29: 14:00:00
  Filled 2016-01-29: qty 5

## 2016-01-29 MED ORDER — MIDAZOLAM HCL 5 MG/5ML IJ SOLN
INTRAMUSCULAR | Status: AC
  Administered 2016-01-29: 3 mg
  Filled 2016-01-29: qty 5

## 2016-01-29 MED ORDER — DEXAMETHASONE SODIUM PHOSPHATE 10 MG/ML IJ SOLN: 10.0000 mg | Freq: Once | INTRAMUSCULAR | Status: DC

## 2016-01-29 MED ORDER — LACTATED RINGERS IV SOLN: 1000.0000 mL | Freq: Once | INTRAVENOUS | Status: DC

## 2016-01-29 MED ORDER — DEXAMETHASONE SODIUM PHOSPHATE 10 MG/ML IJ SOLN
INTRAMUSCULAR | Status: AC
  Administered 2016-01-29: 14:00:00
  Filled 2016-01-29: qty 1

## 2016-01-29 MED ORDER — LIDOCAINE HCL (PF) 1 % IJ SOLN: 10.0000 mL | Freq: Once | INTRAMUSCULAR | Status: DC

## 2016-01-29 NOTE — Patient Instructions (Signed)
Please complete your post procedure diary and bring it with you to your follow-up appointment. Pain Management Discharge Instructions  General Discharge Instructions :  If you need to reach your doctor call: Monday-Friday 8:00 am - 4:00 pm at [phone] or toll free [phone].  After clinic hours [phone] to have operator reach doctor.  Bring all of your medication bottles to all your appointments in the pain clinic.  To cancel or reschedule your appointment with Pain Management please remember to call 24 hours in advance to avoid a fee.  Refer to the educational materials which you have been given on: General Risks, I had my Procedure. Discharge Instructions, Post Sedation.  Post Procedure Instructions:  The drugs you were given will stay in your system until tomorrow, so for the next 24 hours you should not drive, make any legal decisions or drink any alcoholic beverages.  You may eat anything you prefer, but it is better to start with liquids then soups and crackers, and gradually work up to solid foods.  Please notify your doctor immediately if you have any unusual bleeding, trouble breathing or pain that is not related to your normal pain.  Depending on the type of procedure that was done, some parts of your body may feel week and/or numb.  This usually clears up by tonight or the next day.  Walk with the use of an assistive device or accompanied by an adult for the 24 hours.  You may use ice on the affected area for the first 24 hours.  Put ice in a Ziploc bag and cover with a towel and place against area 15 minutes on 15 minutes off.  You may switch to heat after 24 hours.

## 2016-01-29 NOTE — Progress Notes (Signed)
Patient's Name: Cheyenne Pierce  MRN: [id number]  Referring Provider: Ria Bush, MD  DOB: 24-Dec-1934  PCP: Ria Bush, MD  DOS: 01/29/2016  Note by: Kathlen Brunswick. [name], MD  Service setting: Ambulatory outpatient  Location: [facility] (AMB) Pain Management Facility  Visit type: Procedure  Specialty: Interventional Pain Management  Patient type: Established   Primary Reason for Visit: Interventional Pain Management Treatment. CC: Neck Pain (right) and Shoulder Pain (right)  Procedure:  Anesthesia, Analgesia, Anxiolysis:  Type: Diagnostic Cervical Facet Medial Branch Block(s) Region: Posterolateral cervical spine region Level: C3, C4, C5, C6, & C7 Medial Branch Level(s) Laterality: Right-Sided Paraspinal  Type: Local Anesthesia with Moderate (Conscious) Sedation Local Anesthetic: Lidocaine 1% Route: Intravenous (IV) IV Access: Secured Sedation: Meaningful verbal contact was maintained at all times during the procedure  Indication(s): Analgesia and Anxiety  Indications: 1. Cervical facet syndrome   2. Cervical facet hypertrophy   3. Spondylosis of cervical region without myelopathy or radiculopathy    Pain Score: Pre-procedure: 6 /10 Post-procedure: 3 /10  Pre-Procedure Assessment:  Ms. Cheyenne Pierce is a 81 y.o. (year old), female patient, seen today for interventional treatment. She  has a past surgical history that includes Appendectomy (1948); Abdominal hysterectomy (1968); Ovarian cyst surgery (1963); Hernia repair; Colostomy w/ rectocele repair (06/2004); Ventral hernia repair (08/2008); Colonoscopy (07/2009); epidural steroid injections (multiple latest 05/2014, 07/2014); US ECHOCARDIOGRAPHY (2009); NM MYOVIEW LTD (2009); DEXA (2013); Colonoscopy (06/2014); Tonsillectomy; Cardiac catheterization (09/26/1998); bladder suspension with vaginal sling; epidural infections; Esophagogastroduodenoscopy; Anterior cervical decompression/discectomy fusion 4 level (N/A, 06/05/2015); Cataract  extraction w/PHACO (Left, 10/15/2015); Eye surgery (Left, 10/15/2015); and Cataract extraction w/PHACO (Right, 11/26/2015).. Her primarily concern today is the Neck Pain (right) and Shoulder Pain (right) The primary encounter diagnosis was Cervical facet syndrome. Diagnoses of Cervical facet hypertrophy and Spondylosis of cervical region without myelopathy or radiculopathy were also pertinent to this visit.  Pain Type: Chronic pain Pain Descriptors / Indicators: Aching, Constant Pain Frequency: Constant  Date of Last Visit: 01/21/16    Coagulation Parameters Lab Results  Component Value Date   INR 1.0 11/08/2012   LABPROT 13.8 11/08/2012   APTT 32.6 11/08/2012   PLT 337.0 01/07/2016   Verification of the correct person, correct site (including marking of site), and correct procedure were performed and confirmed by the patient.  Consent: Before the procedure and under the influence of no sedative(s), amnesic(s), or anxiolytics, the patient was informed of the treatment options, risks and possible complications. To fulfill our ethical and legal obligations, as recommended by the American Medical Association's Code of Ethics, I have informed the patient of my clinical impression; the nature and purpose of the treatment or procedure; the risks, benefits, and possible complications of the intervention; the alternatives, including doing nothing; the risk(s) and benefit(s) of the alternative treatment(s) or procedure(s); and the risk(s) and benefit(s) of doing nothing. The patient was provided information about the general risks and possible complications associated with the procedure. These may include, but are not limited to: failure to achieve desired goals, infection, bleeding, organ or nerve damage, allergic reactions, paralysis, and death. In addition, the patient was informed of those risks and complications associated to Spine-related procedures, such as failure to decrease pain; infection  (i.e.: Meningitis, epidural or intraspinal abscess); bleeding (i.e.: epidural hematoma, subarachnoid hemorrhage, or any other type of intraspinal or peri-dural bleeding); organ or nerve damage (i.e.: Any type of peripheral nerve, nerve root, or spinal cord injury) with subsequent damage to sensory, motor, and/or autonomic systems, resulting in  permanent pain, numbness, and/or weakness of one or several areas of the body; allergic reactions; (i.e.: anaphylactic reaction); and/or death. Furthermore, the patient was informed of those risks and complications associated with the medications. These include, but are not limited to: allergic reactions (i.e.: anaphylactic or anaphylactoid reaction(s)); adrenal axis suppression; blood sugar elevation that in diabetics may result in ketoacidosis or comma; water retention that in patients with history of congestive heart failure may result in shortness of breath, pulmonary edema, and decompensation with resultant heart failure; weight gain; swelling or edema; medication-induced neural toxicity; particulate matter embolism and blood vessel occlusion with resultant organ, and/or nervous system infarction; and/or aseptic necrosis of one or more joints. Finally, the patient was informed that Medicine is not an exact science; therefore, there is also the possibility of unforeseen or unpredictable risks and/or possible complications that may result in a catastrophic outcome. The patient indicated having understood very clearly. We have given the patient no guarantees and we have made no promises. Enough time was given to the patient to ask questions, all of which were answered to the patient's satisfaction. Ms. Lipton has indicated that she wanted to continue with the procedure.  Consent Attestation: I, the ordering provider, attest that I have discussed with the patient the benefits, risks, side-effects, alternatives, likelihood of achieving goals, and potential problems during  recovery for the procedure that I have provided informed consent.  Pre-Procedure Preparation:  Safety Precautions: Allergies reviewed. The patient was asked about blood thinners, or active infections, both of which were denied. The patient was asked to confirm the procedure and laterality, before marking the site, and again before commencing the procedure. Appropriate site, procedure, and patient were confirmed by following the Joint Commission's Universal Protocol (UP.01.01.01), in the form of a "Time Out". The patient was asked to participate by confirming the accuracy of the "Time Out" information. Patient was assessed for positional comfort and pressure points before starting the procedure. Allergies: She is allergic to codeine; penicillins; prednisone; zoloft [sertraline hcl]; amoxicillin; codeine phosphate; lactose intolerance (gi); metronidazole; perindopril erbumine; potassium-containing compounds; prozac [fluoxetine hcl]; tramadol hcl; and oxcarbazepine.. Allergy Precautions: None required Infection Control Precautions: Sterile technique used. Standard Universal Precautions were taken as recommended by the Department of [facility] for Disease Control and Prevention (CDC). Standard pre-surgical skin prep was conducted. Respiratory hygiene and cough etiquette was practiced. Hand hygiene observed. Safe injection practices and needle disposal techniques followed. SDV (single dose vial) medications used. Medications properly checked for expiration dates and contaminants. Personal protective equipment (PPE) used as per protocol. Monitoring:  As per clinic protocol. Vitals:   01/29/16 1416 01/29/16 1425 01/29/16 1435 01/29/16 1445  BP: 129/73 134/60 126/60 137/64  Pulse: 70 63 61 64  Resp: (!) 24 14 14 14   Temp:      TempSrc:      SpO2: 97% 97% 95% 96%  Weight:      Height:      Calculated BMI: Body mass index is 25.39 kg/m. Time-out: "Time-out" completed before starting procedure,  as per protocol.  Description of Procedure Process:   Time-out: "Time-out" completed before starting procedure, as per protocol. Position: Prone with head of the table was raised to facilitate breathing. Target Area: For Cervical Facet blocks, the target is the postero-lateral waist of the articular pillars at the C3, C4, C5, C6, & C7 levels. Approach: Posterior approach. Area Prepped: Entire Posterior Cervico-thoracic Region Prepping solution: ChloraPrep (2% chlorhexidine gluconate and 70% isopropyl alcohol) Safety Precautions: Aspiration looking  for blood return was conducted prior to all injections. At no point did we inject any substances, as a needle was being advanced. No attempts were made at seeking any paresthesias. Safe injection practices and needle disposal techniques used. Medications properly checked for expiration dates. SDV (single dose vial) medications used. Description of the Procedure: Protocol guidelines were followed. The patient was placed in position over the fluoroscopy table. The target area was identified and the area prepped in the usual manner. Skin desensitized using vapocoolant spray. Skin & deeper tissues infiltrated with local anesthetic. Appropriate amount of time allowed to pass for local anesthetics to take effect. The procedure needle was introduced through the skin, ipsilateral to the reported pain, and advanced to the target area. Bone was contacted on the posterior aspect of the articular pillars and the needle walked lateral, until the border was cleared. Lateral views taken to make sure the needle tip did not advance past the posterior third of the lateral mass of the posterior columns. The procedure was repeated in identical fashion for each level. Negative aspiration confirmed. Solution injected in intermittent fashion, asking for systemic symptoms every 0.5cc of injectate. The needles were then removed and the area cleansed, making sure to leave some of the  prepping solution back to take advantage of its long term bactericidal properties. EBL: None Materials & Medications Used:  Needle(s) Used: 22g - 3.5" Spinal Needle(s) Medication(s): Please see chart orders for medication and dosing details.  Imaging Guidance (Spinal):  Type of Imaging Technique: Fluoroscopy Guidance (Spinal) Indication(s): Assistance in needle guidance and placement for procedures requiring needle placement in or near specific anatomical locations not easily accessible without such assistance. Exposure Time: Please see nurses notes. Contrast: None used. Fluoroscopic Guidance: I was personally present during the use of fluoroscopy. "Tunnel Vision Technique" used to obtain the best possible view of the target area. Parallax error corrected before commencing the procedure. "Direction-depth-direction" technique used to introduce the needle under continuous pulsed fluoroscopy. Once target was reached, antero-posterior, oblique, and lateral fluoroscopic projection used confirm needle placement in all planes. Images permanently stored in EMR. Interpretation: No contrast injected. I personally interpreted the imaging intraoperatively. Adequate needle placement confirmed in multiple planes. Permanent images saved into the patient's record.  Antibiotic Prophylaxis:  Indication(s): No indications identified. Type:  Antibiotics Given (last 72 hours)    None      Post-operative Assessment:  Complications: No immediate post-treatment complications observed by team, or reported by patient. Disposition: The patient tolerated the entire procedure well. A repeat set of vitals were taken after the procedure and the patient was kept under observation following institutional policy, for this type of procedure. Post-procedural neurological assessment was performed, showing return to baseline, prior to discharge. The patient was provided with post-procedure discharge instructions, including a  section on how to identify potential problems. Should any problems arise concerning this procedure, the patient was given instructions to immediately contact us, at any time, without hesitation. In any case, we plan to contact the patient by telephone for a follow-up status report regarding this interventional procedure. Comments:  No additional relevant information.  Plan of Care  Discharge to: Discharge home  Medications ordered for procedure: Meds ordered this encounter  Medications  . midazolam (VERSED) 5 MG/5ML injection    COCHRAN, HANNAH: cabinet override  . lactated ringers infusion 1,000 mL  . midazolam (VERSED) 5 MG/5ML injection 1-2 mg    Make sure Flumazenil is available in the pyxis when using this medication. If  oversedation occurs, administer 0.2 mg IV over 15 sec. If after 45 sec no response, administer 0.2 mg again over 1 min; may repeat at 1 min intervals; not to exceed 4 doses (1 mg)  . dexamethasone (DECADRON) injection 10 mg  . lidocaine (PF) (XYLOCAINE) 1 % injection 10 mL  . ropivacaine (PF) 2 mg/ml (0.2%) (NAROPIN) epidural 9 mL  . ropivacaine (PF) 2 mg/ml (0.2%) (NAROPIN) 2 MG/ML epidural    COCHRAN, HANNAH: cabinet override  . lidocaine (PF) (XYLOCAINE) 1 % injection    COCHRAN, HANNAH: cabinet override  . dexamethasone (DECADRON) 10 MG/ML injection    COCHRAN, HANNAH: cabinet override   Medications administered: (For more details, see medical record) We administered midazolam, ropivacaine (PF) 2 mg/ml (0.2%), lidocaine (PF), and dexamethasone.  Imaging Ordered: No results found for this or any previous visit. New Prescriptions   No medications on file   Physician-requested Follow-up:  Return in about 2 weeks (around 02/12/2016) for Post-Procedure evaluation.  Future Appointments Date Time Provider [place]  03/05/2016 1:00 PM Milinda Pointer, MD [facility] None  04/18/2016 12:00 PM Ria Bush, MD [facility]  05/01/2016 1:00 PM  Milinda Pointer, MD [facility] None   Primary Care Physician: Ria Bush, MD Location: [facility] Outpatient Pain Management Facility Note by: Kathlen Brunswick. [name], M.D, DABA, DABAPM, DABPM, DABIPP, FIPP  Disclaimer:  Medicine is not an exact science. The only guarantee in medicine is that nothing is guaranteed. It is important to note that the decision to proceed with this intervention was based on the information collected from the patient. The Data and conclusions were drawn from the patient's questionnaire, the interview, and the physical examination. Because the information was provided in large part by the patient, it cannot be guaranteed that it has not been purposely or unconsciously manipulated. Every effort has been made to obtain as much relevant data as possible for this evaluation. It is important to note that the conclusions that lead to this procedure are derived in large part from the available data. Always take into account that the treatment will also be dependent on availability of resources and existing treatment guidelines, considered by other Pain Management Practitioners as being common knowledge and practice, at the time of the intervention. For Medico-Legal purposes, it is also important to point out that variation in procedural techniques and pharmacological choices are the acceptable norm. The indications, contraindications, technique, and results of the above procedure should only be interpreted and judged by a Board-Certified Interventional Pain Specialist with extensive familiarity and expertise in the same exact procedure and technique. Attempts at providing opinions without similar or greater experience and expertise than that of the treating physician will be considered as inappropriate and unethical, and shall result in a formal complaint to the state medical board and applicable specialty societies.  Instructions provided at this appointment: Patient Instructions   Please complete your post procedure diary and bring it with you to your follow-up appointment. Pain Management Discharge Instructions  General Discharge Instructions :  If you need to reach your doctor call: Monday-Friday 8:00 am - 4:00 pm at [phone] or toll free [phone].  After clinic hours [phone] to have operator reach doctor.  Bring all of your medication bottles to all your appointments in the pain clinic.  To cancel or reschedule your appointment with Pain Management please remember to call 24 hours in advance to avoid a fee.  Refer to the educational materials which you have been given on: General Risks, I had my Procedure.  Discharge Instructions, Post Sedation.  Post Procedure Instructions:  The drugs you were given will stay in your system until tomorrow, so for the next 24 hours you should not drive, make any legal decisions or drink any alcoholic beverages.  You may eat anything you prefer, but it is better to start with liquids then soups and crackers, and gradually work up to solid foods.  Please notify your doctor immediately if you have any unusual bleeding, trouble breathing or pain that is not related to your normal pain.  Depending on the type of procedure that was done, some parts of your body may feel week and/or numb.  This usually clears up by tonight or the next day.  Walk with the use of an assistive device or accompanied by an adult for the 24 hours.  You may use ice on the affected area for the first 24 hours.  Put ice in a Ziploc bag and cover with a towel and place against area 15 minutes on 15 minutes off.  You may switch to heat after 24 hours.

## 2016-01-29 NOTE — Progress Notes (Signed)
Safety precautions to be maintained throughout the outpatient stay will include: orient to surroundings, keep bed in low position, maintain call bell within reach at all times, provide assistance with transfer out of bed and ambulation.  

## 2016-01-30 ENCOUNTER — Telehealth: Payer: Self-pay | Admitting: *Deleted

## 2016-01-30 NOTE — Telephone Encounter (Signed)
Spoke with patient states she is very sore and felt a little unsteady yesterday but otherwise is doing okay. Is eating and drinking and using ice to affected area.

## 2016-02-12 ENCOUNTER — Other Ambulatory Visit: Payer: Self-pay | Admitting: Family Medicine

## 2016-03-05 ENCOUNTER — Encounter: Payer: Self-pay | Admitting: Pain Medicine

## 2016-03-05 ENCOUNTER — Ambulatory Visit: Payer: Medicare Other | Attending: Pain Medicine | Admitting: Pain Medicine

## 2016-03-05 VITALS — BP 150/79 | HR 72 | Temp 98.0°F | Resp 16 | Ht 60.0 in | Wt 130.0 lb

## 2016-03-05 DIAGNOSIS — Z79899 Other long term (current) drug therapy: Secondary | ICD-10-CM | POA: Insufficient documentation

## 2016-03-05 DIAGNOSIS — R51 Headache: Secondary | ICD-10-CM | POA: Diagnosis not present

## 2016-03-05 DIAGNOSIS — M545 Low back pain: Secondary | ICD-10-CM | POA: Diagnosis not present

## 2016-03-05 DIAGNOSIS — M4722 Other spondylosis with radiculopathy, cervical region: Secondary | ICD-10-CM | POA: Insufficient documentation

## 2016-03-05 DIAGNOSIS — Z7982 Long term (current) use of aspirin: Secondary | ICD-10-CM | POA: Insufficient documentation

## 2016-03-05 DIAGNOSIS — Z90710 Acquired absence of both cervix and uterus: Secondary | ICD-10-CM | POA: Diagnosis not present

## 2016-03-05 DIAGNOSIS — G894 Chronic pain syndrome: Secondary | ICD-10-CM | POA: Insufficient documentation

## 2016-03-05 DIAGNOSIS — M47812 Spondylosis without myelopathy or radiculopathy, cervical region: Secondary | ICD-10-CM | POA: Diagnosis not present

## 2016-03-05 DIAGNOSIS — Z9889 Other specified postprocedural states: Secondary | ICD-10-CM | POA: Insufficient documentation

## 2016-03-05 DIAGNOSIS — Z5189 Encounter for other specified aftercare: Secondary | ICD-10-CM | POA: Diagnosis not present

## 2016-03-05 DIAGNOSIS — M542 Cervicalgia: Secondary | ICD-10-CM | POA: Insufficient documentation

## 2016-03-05 MED ORDER — BUPRENORPHINE 5 MCG/HR TD PTWK: 5.0000 ug | MEDICATED_PATCH | TRANSDERMAL | 0 refills | Status: DC

## 2016-03-05 NOTE — Progress Notes (Signed)
Safety precautions to be maintained throughout the outpatient stay will include: orient to surroundings, keep bed in low position, maintain call bell within reach at all times, provide assistance with transfer out of bed and ambulation.  

## 2016-03-05 NOTE — Patient Instructions (Signed)
Buprenorphine transdermal patch What is this medicine? BUPRENORPHINE (byoo pre NOR feen) is a pain reliever. It is used to treat moderate to severe pain. COMMON BRAND NAME(S): Butrans What should I tell my health care provider before I take this medicine? They need to know if you have any of these conditions: -blockage in your bowel -brain tumor -drink more than 3 alcohol-containing drinks per day -drug abuse or addiction -fever -head injury -kidney disease -liver disease -lung or breathing disease, like asthma -thyroid disease -trouble passing urine or change in the amount of urine -an unusual or allergic reaction to buprenorphine, other medicines, foods, dyes, or preservatives -pregnant or trying to get pregnant -breast-feeding How should I use this medicine? Apply the patch to your skin. Do not cut or damage the patch. A cut or damaged patch can be very dangerous because you may get too much medicine. Select a clean, dry area of skin on your upper outer arm, upper chest, upper back, or the side of the chest. Do not apply the patch to broken, burned, cut, or irritated skin. Use only water to clean the area. Do not use soap or alcohol to clean the skin because this can increase the effects of the medicine. If the area is hairy, clip the hair with scissors, but do not shave. Take the patch out of its wrapper. Bend the patch along the faint line and slowly peel the outer portion of the liner, which covers the sticky surface of the patch. Press the patch onto the skin and slowly peel off the protective liner. Do not use a patch if the packaging or backing is damaged. Do not touch the sticky part with your fingers. Press the patch to the skin using the palm of your hand. Press the patch to the skin for 15 seconds. Wash your hands at once. Keep patches far away from children. Do not let children see you apply the patch and do not apply it where children can see it. Do not call the patch a sticker,  tattoo, or bandage, as this could encourage the child to mimic your actions. Used patches still contain medicine. Children or pets can have serious side effects or die from putting used patches in their mouth or on their bodies. Take off the old patch before putting on a new patch. Apply each new patch to a different area of skin. If a patch comes off or causes irritation, remove it and apply a new patch to a different site. If the edges of the patch start to loosen, first apply first aid tape to the edges of the patch. If problems with the patch not sticking continue, cover the patch with a see-through adhesive dressing (like Bioclusive or Tegaderm). Never cover the patch with any other bandage or tape. To get rid of used patches, fold the patch in half with the sticky sides together. Then, flush it down the toilet. Alternately, you may dispose of the patch in the Patch-Disposal Unit provided. Never throw the patch away in the trash without sealing it in the Patch-Disposal unit. Replace the patch every 7 days. Follow the directions on the prescription label. Do not use more medicine than you are told to use. A special MedGuide will be given to you by the pharmacist with each prescription and refill. Be sure to read this information carefully each time. Talk to your pediatrician regarding the use of this medicine in children. Special care may be needed. If a patch accidentally touches the skin,  use only water to clean the area. Do not use soap or alcohol to clean the skin because this can increase the effects of the medicine. If someone accidentally uses a buprenorphine patch and is not awake and alert, immediately call 911 for help. If the person is awake and alert, call a doctor, health care professional, or the [employer]. What if I miss a dose? If you forget to replace your patch, take off the old patch and put on a new patch as soon as you can. Do not apply an extra patch to your skin. Do not  wear more than one patch at the same time unless told to do so by your doctor or health care professional. What may interact with this medicine? Do not take this medication with any of the following medicines: -cisapride -certain medicines for fungal infections like ketoconazole and itraconazole -dofetilide -dronedarone -pimozide -ritonavir -thioridazine -ziprasidone This medicine may interact with the following medications: -alcohol -antihistamines for allergy, cough and cold -antiviral medicines for HIV or AIDS -atropine -certain antibiotics like clarithromycin, erythromycin, linezold, rifampin -certain medicines for anxiety or sleep -certain medicines for bladder problems like oxybutynin, tolterodine -certain medicines for depression like amitriptyline, fluoxetine, sertraline -certain medicines for migraine headache like almotriptan, eletriptan, frovatriptan, naratriptan, rizatriptan, sumatriptan, zolmitriptan -certain medicines for nausea or vomiting like dolasetron, ondansetron, palonosetron -certain medicines for Parkinson's disease like benztropine, trihexyphenidyl -certain medicines for seizures like phenobarbital, primidone -certain medicines for stomach problems like cimetidine, dicyclomine, hyoscyamine -certain medicines for travel sickness like scopolamine -diuretics -general anesthetics like halothane, isoflurane, methoxyflurane, propofol -ipratropium -local anesthetics like lidocaine, pramoxine, tetracaine -MAOIs like Carbex, Eldepryl, Marplan, Nardil, and Parnate -medicines that relax muscles for surgery -methylene blue -other medicines that prolong the QT interval (cause an abnormal heart rhythm) -other narcotic medicines for pain or cough -phenothiazines like chlorpromazine, mesoridazine, prochlorperazine, thioridazine What should I watch for while using this medicine? Other pain medicine may be needed when you first start using the patch because the patch can  take some time to start working. Tell your doctor or health care professional if your pain does not go away, if it gets worse, or if you have new or a different type of pain. You may develop tolerance to the medicine. Tolerance means that you will need a higher dose of the medicine for pain relief. Tolerance is normal and is expected if you take the medicine for a long time. Do not suddenly stop taking your medicine because you may develop a severe reaction. Your body becomes used to the medicine. This does NOT mean you are addicted. Addiction is a behavior related to getting and using a drug for a non-medical reason. If you have pain, you have a medical reason to take pain medicine. Your doctor will tell you how much medicine to take. If your doctor wants you to stop the medicine, the dose will be slowly lowered over time to avoid any side effects. If you are also taking a narcotic medicine for pain or cough or another medicine that also causes drowsiness, you may have more side effects. Give your health care provider a list of all medicines you use. Your doctor will tell you how much medicine to take. Do not take more medicine than directed. Call emergency for help if you have problems breathing or unusual sleepiness. You may get drowsy or dizzy. Do not drive, use machinery, or do anything that needs mental alertness until you know how this medicine affects you. Do not stand  or sit up quickly, especially if you are an older patient. This reduces the risk of dizzy or fainting spells. Alcohol may interfere with the effect of this medicine. Avoid alcoholic drinks. The medicine will cause constipation. Try to have a bowel movement at least every 2 to 3 days. If you do not have a bowel movement for 3 days, call your doctor or health care professional. Your mouth may get dry. Chewing sugarless gum or sucking hard candy, and drinking plenty of water may help. Contact your doctor if the problem does not go away or is  severe. Heat can increase the amount of medicine released from the patch. Do not get the patch hot by using heating pads, heated water beds, electric blankets, and heat lamps. You can bathe or swim while using the patch. But, do not use a sauna or hot tub. Tell you doctor or health care professional if you get a fever. What side effects may I notice from receiving this medicine? Side effects that you should report to your doctor or health care professional as soon as possible: -allergic reactions like skin rash, itching or hives, swelling of the face, lips, or tongue -breathing problems -confusion -signs and symptoms of a dangerous change in heartbeat or heart rhythm like chest pain; dizziness; fast or irregular heartbeat; palpitations; feeling faint or lightheaded, falls; breathing problems -signs and symptoms of liver injury like dark yellow or brown urine; general ill feeling or flu-like symptoms; light-colored stools; loss of appetite; nausea; right upper belly pain; unusually weak or tired; yellow of the eyes or skin -signs and symptoms of low blood pressure like dizziness; feeling faint or lightheaded, falls; unusually weak or tired -trouble passing urine or change in the amount of urine Side effects that usually do not require medical attention (report to your doctor or health care professional if they continue or are bothersome): -constipation -dry mouth -itching, redness, or rash at the patch site -nausea, vomiting -tiredness Where should I keep my medicine? Keep out of the reach of children. This medicine can be abused. Keep your medicine in a safe place to protect it from theft. Do not share this medicine with anyone. Selling or giving away this medicine is dangerous and against the law. Store at room temperature between 59 and 86 degrees F (15 and 30 degrees C). Do not store the patches out of their wrappers. This medicine may cause accidental overdose and death if it is taken by  other adults, children, or pets. Flush any unused medicine down the toilet as instructed above to reduce the chance of harm. Alternately, you may dispose of the patch in the Patch-Disposal Unit provided. Do not use the medicine after the expiration date.  2017 [employer] (2015-04-26 10:13:00)

## 2016-03-05 NOTE — Progress Notes (Signed)
Patient's Name: Cheyenne Pierce  MRN: [id number]  Referring Provider: Ria Bush, MD  DOB: 04/08/1934  PCP: Ria Bush, MD  DOS: 03/05/2016  Note by: Kathlen Brunswick. [name], MD  Service setting: Ambulatory outpatient  Specialty: Interventional Pain Management  Location: [facility] (AMB) Pain Management Facility    Patient type: Established   Primary Reason(s) for Visit: Encounter for post-procedure evaluation of chronic illness with mild to moderate exacerbation CC: Back Pain (left, lower); Headache; and Neck Pain (center, both sides)  HPI  Cheyenne Pierce is a 81 y.o. year old, female patient, who comes today for a post-procedure evaluation. She has Hypothyroidism; GAD (generalized anxiety disorder); Hereditary and idiopathic peripheral neuropathy; Essential hypertension; Reflux esophagitis; GERD; INGUINAL HERNIA, LEFT; Irritable bowel syndrome; OTHER CHRONIC CYSTITIS; Osteopenia; Systolic murmur; Medicare annual wellness visit, subsequent; PVCs (premature ventricular contractions); Prediabetes; Mood swings ([place]); Memory deficit; Advanced care planning/counseling discussion; HLD (hyperlipidemia); Health maintenance examination; Recurrent falls; Night sweats; Chronic pain syndrome; Encounter for therapeutic drug level monitoring; Encounter for chronic pain management; Chronic low back pain; Chronic neck pain (R>L); Cervical spondylosis; Chronic cervical radicular pain (Bilateral) (R>L); Numbness of upper extremity (Bilateral) (R>L); Upper extremity weakness (Bilateral); Neurogenic pain; Neuropathic pain; Inflammatory pain; Chronic Lumbar Radicular pain (Left); Allodynia; Encounter for long-term current use of medication; Lumbar spondylosis; Chronic Lumbar Radiculopathy (Left); Chronic lower extremity pain (Bilateral) (L>R); Chronic hip pain (Right); Abnormal Cervical MRI (01/24/15); Cervical spinal stenosis (C3 through C7); Cervical facet hypertrophy; Cervical foraminal stenosis (C3-4 through C6-7) (R>L);  Chronic cervical radiculopathy (Bilateral) (R>L) (pain, numbness, and weakness); Abnormal Lumbar MRI (11/10/2014); Lumbar spinal stenosis (L3-4); Lumbar foraminal stenosis (Bilateral L3-4) (Right L4-5); MDD (major depressive disorder), recurrent episode, moderate ([place]); Fibromyalgia; Neck pain on right side; and Cervical facet syndrome on her problem list. Her primarily concern today is the Back Pain (left, lower); Headache; and Neck Pain (center, both sides)  Pain Assessment: Self-Reported Pain Score: 5 /10             Reported level is compatible with observation.       Pain Type: Chronic pain Pain Location: Neck Pain Orientation: Mid Pain Descriptors / Indicators: Constant Pain Frequency: Constant  Cheyenne Pierce comes in today for post-procedure evaluation after the treatment done on 01/29/2016. Butrans 5 mg trial x 2 wks.  Further details on both, my assessment(s), as well as the proposed treatment plan, please see below.  Post-Procedure Assessment  01/29/2016 Procedure: Diagnostic right-sided cervical facet block under fluoroscopic guidance and IV sedation. Influential Factors: BMI: 25.39 kg/m Intra-procedural challenges: None observed Assessment challenges: None detected         Post-procedural side-effects, adverse reactions, or complications: None reported Reported issues: None  Sedation: Please see nurses note. When no sedatives are used, the analgesic levels obtained are directly associated to the effectiveness of the local anesthetics. However, when sedation is provided, the level of analgesia obtained during the initial 1 hour following the intervention, is believed to be the result of a combination of factors. These factors may include, but are not limited to: 1. The effectiveness of the local anesthetics used. 2. The effects of the analgesic(s) and/or anxiolytic(s) used. 3. The degree of discomfort experienced by the patient at the time of the procedure. 4. The patients  ability and reliability in recalling and recording the events. 5. The presence and influence of possible secondary gains and/or psychosocial factors. Reported result: Relief experienced during the 1st hour after the procedure: 50 % (Ultra-Short Term Relief) Interpretative annotation: Analgesia during this  period is likely to be Local Anesthetic and/or IV Sedative (Analgesic/Anxiolitic) related.          Effects of local anesthetic: The analgesic effects attained during this period are directly associated to the localized infiltration of local anesthetics and therefore cary significant diagnostic value as to the etiological location, or anatomical origin, of the pain. Expected duration of relief is directly dependent on the pharmacodynamics of the local anesthetic used. Long-acting (4-6 hours) anesthetics used.  Reported result: Relief during the next 4 to 6 hour after the procedure: 0 % (Short-Term Relief) Interpretative annotation: No analgesic effect. This would suggest pain etiology to reside elsewhere.          Long-term benefit: Defined as the period of time past the expected duration of local anesthetics. With the possible exception of prolonged sympathetic blockade from the local anesthetics, benefits during this period are typically attributed to, or associated with, other factors such as analgesic sensory neuropraxia, antiinflammatory effects, or beneficial biochemical changes provided by agents other than the local anesthetics Reported result: Extended relief following procedure: 0 % (Long-Term Relief) Interpretative annotation: No benefit. This could suggest algesic mechanism to be mechanical rather than inflammatory.          Current benefits: Defined as persistent relief that continues at this point in time.   Reported results: Treated area: 0 %       Interpretative annotation: No benefit whatsoever. This would argue against repeating therapy  Interpretation: Results would suggest  non-involvement of the tested area.          Recent Diagnostic Imaging Review  Dg C-arm 1-60 Min-no Report Result Date: 01/29/2016 CLINICAL DATA: Assistance in needle guidance and placement for procedures requiring needle placement in or near specific anatomical locations not easily accessible without such assistance. C-ARM 1-60 MINUTES Fluoroscopy was utilized by the requesting physician.  No radiographic interpretation.   Cervical Imaging: Cervical MR wo contrast:  Results for orders placed in visit on 06/13/14  MR Cervical Spine Wo Contrast   Cervical MR wo contrast:  Results for orders placed in visit on 05/04/14  MR C Spine Ltd W/O Cm   Narrative * PRIOR REPORT IMPORTED FROM AN EXTERNAL SYSTEM *   CLINICAL DATA:  Neck pain. RIGHT scapular in RIGHT shoulder pain  with numbness in the RIGHT hand for 6 months. RIGHT upper extremity  radiculopathy.   EXAM:  MRI CERVICAL SPINE WITHOUT CONTRAST   TECHNIQUE:  Multiplanar, multisequence MR imaging of the cervical spine was  performed. No intravenous contrast was administered.   COMPARISON:  Radiographs 01/04/2013.   FINDINGS:  Alignment: There is a mild dextroconvex torticollis. 1 mm  retrolisthesis of C3 on C4, C4 on C5 and C5 on C6. Between 1 mm and  2 mm anterolisthesis of C7 on T1. Spondylolisthesis appears  degenerative.   Vertebrae: Degenerative endplate changes. No aggressive osseous  lesions.   Cord: No cord edema or intramedullary lesions.   Posterior Fossa: Within normal limits.   Vertebral Arteries: RIGHT dominant vertebral system. Flow voids  present bilaterally.   Paraspinal tissues: Mild nonspecific thickening of the esophagus at  the thoracic inlet. This may represent retained fluid based on the  axial images. Correlate for dysphagia.   Disc levels:   Diffuse disc desiccation is present, commonly seen in this age  group.   C2-C3:  Negative.   C3-C4: Shallow disc osteophyte complex with very mild  central  stenosis. No cord deformity. LEFT foraminal stenosis due to  uncovertebral  spurring and short pedicles.   C4-C5: Mild central stenosis. Broad-based disc osteophyte complex  with collapse of the disc. Bilateral LEFT-greater-than- RIGHT  foraminal stenosis due to uncovertebral spurring.   C5-C6: Moderate central stenosis associated with broad-based disc  osteophyte complex and posterior ligamentum flavum redundancy.  Flattening of the ventral cord. Bilateral foraminal stenosis due to  uncovertebral spurring, greater on the LEFT than RIGHT.   C6-C7: RIGHT eccentric disc osteophyte complex. Central stenosis is  mild. There is bilateral LEFT-greater-than-RIGHT foraminal stenosis  secondary to uncovertebral spurring and short pedicles.   C7-T1: Bilateral facet arthrosis accounts for the anterolisthesis.  Central canal is patent. Bilateral foraminal stenosis due to  uncovertebral spurring and facet arthrosis potentially affecting  both C8 nerves.   IMPRESSION:  Moderate to severe multilevel cervical spine degenerative disease,  detailed above. In this patient with potential RIGHT upper extremity  radiculopathy, there is RIGHT foraminal stenosis at multiple levels  the could be implicated. The most severe central stenosis is at  C5-C6.    Electronically Signed    By: Dereck Ligas M.D.    On: 05/04/2014 15:19       Cervical CT wo contrast:  Results for orders placed during the hospital encounter of 09/26/15  CT Cervical Spine Wo Contrast   Narrative CLINICAL DATA:  81 year old female with severe cervical neck pain radiating to the shoulder blades. Progressive symptoms since cervical fusion in February. Subsequent encounter.  EXAM: CT CERVICAL SPINE WITHOUT CONTRAST  TECHNIQUE: Multidetector CT imaging of the cervical spine was performed without intravenous contrast. Multiplanar CT image reconstructions were also generated.  COMPARISON:  Outside cervical spine MRI  01/24/2015. Preoperative cervical spine CT 04/12/2015. Intraoperative images [id number]. Postoperative [place] [facility] cervical radiographs 07/04/2015.  FINDINGS: Improved cervical lordosis compared to the preoperative studies. Visualized skull base is intact. No atlanto-occipital dissociation. Bilateral posterior element alignment is within normal limits. Trace anterolisthesis at C7-T1 and T1-T2 is stable with associated mild to moderate chronic facet hypertrophy. Visible upper thoracic levels appear stable and intact.  Negative visualized posterior fossa. Stable and well pneumatized visualized paranasal sinuses and mastoids. Prevertebral and paraspinal soft tissues appear normal. Calcified atherosclerosis re- demonstrated at the carotid bifurcations. Negative lung apices. Calcified atherosclerosis of the visible aortic arch.  C2-C3: Subtle disc bulging and mild ligament flavum hypertrophy may be increased. Mild right facet hypertrophy is stable. No significant stenosis.  C3-C4: C3 and C4 cortical screws appear intact without loosening. Interbody implant placement appears stable. There is evidence of developing interbody ossification to the left of midline (coronal image 23), but no convincing arthrodesis at this time. Mild uncovertebral hypertrophy on the left.  C4-C5: C4 and C5 cortical screws appear intact without loosening. Interbody implant placement is stable. No interbody arthrodesis at this time. Mild to moderate facet and uncovertebral hypertrophy re- demonstrated greater on the left.  C5-C6: C5 and C6 cortical screws appear intact without loosening. Stable interbody implant. No interbody arthrodesis at this time. Left side facet and uncovertebral hypertrophy re- demonstrated.  C6-C7: C6 and C7 cortical screws appear intact. There is mild lucency surrounding the left C7 screw best seen on coronal image 18. Interbody implant positioning is stable. No interbody  arthrodesis at this time. Mild-to-moderate facet and uncovertebral hypertrophy re - demonstrated Ing greater on the left.  C7-T1: Stable mild to moderate facet and mild uncovertebral hypertrophy.  IMPRESSION: 1. ACDF from the C3 to the C7 level. There is mild lucencies surrounding the left C7 cortical screw which may indicate  loosening. Otherwise no adverse hardware features, and interbody implant placement appears stable. 2. No new findings in the cervical spine.   Electronically Signed   By: Genevie Ann M.D.   On: 09/26/2015 13:56    Cervical DG 1 view:  Results for orders placed during the hospital encounter of 06/05/15  DG Cervical Spine 1 View   Narrative CLINICAL DATA:  ACDF C3-7  EXAM: DG C-ARM 61-120 MIN; DG CERVICAL SPINE - 1 VIEW  COMPARISON:  None  FLUOROSCOPY TIME:  15 seconds  FINDINGS: Intraoperative fluoroscopic spot image demonstrates interval ACDF from C3 through C7 with plate and screw fixation.  IMPRESSION: Interval ACDF C3-7.   Electronically Signed   By: Kathreen Devoid   On: 06/05/2015 13:32    Cervical DG Bending/F/E views:  Results for orders placed in visit on 10/26/08  DG Cervical Spine With Flex & Extend   Narrative Clinical Data: The patient feels like something is stuck in her throat.   CERVICAL SPINE COMPLETE WITH FLEXION AND EXTENSION VIEWS   Comparison: None available.   Findings: There is straightening of the normal cervical lordosis. The patient has multilevel degenerative disc disease with involvement most notable from C3-4 to C6-7.  There are both anterior and posterior osteophytes.  Anterior osteophytes are most prominent at C5-6 and C6-7.  No pathologic motion is seen with flexion or extension.  Vertebral body height and alignment are maintained.  Lung apices are clear.   IMPRESSION: Marked multilevel spondylosis of the cervical spine with anterior endplate spurring most prominent at C5-6 and C6-7.  Provider: [name], Lelon Huh   Cervical DG complete:  Results for orders placed in visit on 05/02/13  DG Cervical Spine Complete   Shoulder Imaging: Shoulder-R DG:  Results for orders placed in visit on 06/13/14  DG Shoulder Right   Thoracic Imaging: Thoracic DG w/swimmers view:  Results for orders placed during the hospital encounter of 05/13/12  [facility] Thoracic Spine W/Swimmers   Narrative *RADIOLOGY REPORT*  Clinical Data: Thoracic spine pain after two falls 1 month ago.  THORACIC SPINE - 2 VIEW + SWIMMERS  Comparison: Chest x-ray dated 08/23/2008  Findings: There is no fracture or bone destruction.  The patient has multilevel degenerative disc disease throughout the upper and mid thoracic spine, essentially unchanged since 08/23/2008.  No acute abnormalities.  IMPRESSION: No acute abnormalities of the thoracic spine.   Original Report Authenticated By: Lorriane Shire, M.D.    Lumbosacral Imaging: Lumbar MR wo contrast:  Results for orders placed in visit on 06/13/14  MR Lumbar Spine Wo Contrast   Lumbar MR wo contrast:  Results for orders placed in visit on 10/29/09  MR L Spine Ltd W/O Cm   Narrative * PRIOR REPORT IMPORTED FROM AN EXTERNAL SYSTEM *   PRIOR REPORT IMPORTED FROM THE [facility] [place] EXAM:    low back pain and lumbar radiculitis  COMMENTS:   PROCEDURE:     MR  - MR LUMBAR SPINE WO CONTRAST  - Oct 29 2009  3:08PM   RESULT:   Multiplanar and multisequence imaging of the lumbar spine was obtained  without the administration of gadolinium.   The conus medullaris terminates at an L1 level. The cauda equina  demonstrate  no evidence of clumping nor thickening.   At the T11-T12, T12-L1 levels there is no evidence of thecal sac stenosis  or  neural foraminal narrowing.   At the L1-2 level a mild broad-based lobulated disc  bulge is appreciated  causing partial effacement of the anterior CSF space. There is no evidence  of  significant neural foraminal narrowing.   At the L2-L3 disc space level a mild broad-based disc bulge is appreciated  causing partial effacement of the anterior CSF space without evidence of  significant thecal sac stenosis. The neural foramen appears patent.   At the L3-4 disc space level a lateralized disc bulge is appreciated with  extension to the left. There is mild effacement of the anterior CSF space.  Neural foraminal narrowing on the left is appreciated which is moderate  with  encroachment upon the exiting nerve root sheath with possible exiting  nerve  root compromise as well as a component of mild compression. The neural  foramen on the right is patent.   At the L4-L5 disc space level mild grade 1 anterolisthesis is appreciated  with a pseudodisc bulge. There is no evidence of significant thecal sac  stenosis. There is mild neural foraminal narrowing on the left without  evidence of exiting nerve root compression though mild compromise cannot  be  excluded. Mild to moderate neural foraminal narrowing is appreciated on  the  right with exiting nerve root compromise as well as possibly a component  of  mild compression.   At the L5-S1 level there is no evidence of thecal sac stenosis nor neural  foraminal narrowing.   IMPRESSION:      There is no evidence of significant thecal sac stenosis.  There are regions of mild to moderate neural foraminal narrowing with  possible exiting nerve root compromise and possible mild compression as  described above.   Thank you for the opportunity to contribute to the care of your patient.       Lumbar DG Bending views:  Results for orders placed in visit on 06/13/14  DG Lumbar Spine Complete W/Bend   Sacroiliac Joint Imaging: Sacroiliac Joint DG:  Results for orders placed in visit on 10/29/09  DG Si Joints   Narrative * PRIOR REPORT IMPORTED FROM AN EXTERNAL SYSTEM *   PRIOR REPORT IMPORTED FROM THE [facility] WORKFLOW SYSTEM    REASON FOR EXAM:    sacroilliac joint lt side x-ray per DR.  COMMENTS:   PROCEDURE:     DXR - DXR SACROILIAC JOINTS  - Oct 29 2009  4:09PM   RESULT:     Image of the pelvis demonstrates multiple coils from previous  abdominal surgery, presumably hernia repair. Degenerative changes are seen  in the pubic symphysis and to a lesser extent in the sacroiliac joints and  lumbosacral facets. No bony destruction is evident. The sacral arches  appear  intact.   IMPRESSION:      Degenerative and postsurgical changes as described.       Note: Imaging results reviewed.  Laboratory Chemistry  Inflammation Markers Lab Results  Component Value Date   ESRSEDRATE 11 01/07/2016   CRP 0.7 09/12/2014   Renal Function Lab Results  Component Value Date   BUN 10 01/07/2016   CREATININE 0.64 01/07/2016   GFRAA >60 05/22/2015   GFRNONAA >60 05/22/2015   Hepatic Function Lab Results  Component Value Date   AST 27 02/07/2015   ALT 21 02/07/2015   ALBUMIN 4.0 02/07/2015   Electrolytes Lab Results  Component Value Date   NA 135 01/07/2016   K 3.8 01/07/2016   CL 98 01/07/2016   CALCIUM 9.4 01/07/2016   Pain Modulating Vitamins Lab Results  Component Value Date  VD25OH 47.39 10/04/2013   VITAMINB12 >1500 (H) 09/22/2012   Coagulation Parameters Lab Results  Component Value Date   INR 1.0 11/08/2012   LABPROT 13.8 11/08/2012   APTT 32.6 11/08/2012   PLT 337.0 01/07/2016   Cardiovascular Lab Results  Component Value Date   HGB 13.8 01/07/2016   HCT 40.9 01/07/2016   Note: Lab results reviewed.  Recent Diagnostic Imaging Review  Dg C-arm 1-60 Min-no Report  Result Date: 01/29/2016 CLINICAL DATA: Assistance in needle guidance and placement for procedures requiring needle placement in or near specific anatomical locations not easily accessible without such assistance. C-ARM 1-60 MINUTES Fluoroscopy was utilized by the requesting physician.  No radiographic interpretation.    Note: Imaging results reviewed.  Meds  The patient has a current medication list which includes the following prescription(s): amlodipine, aspirin, astragalus, biotin, buprenorphine, calcium-vitamin d, diphenhydramine, hyoscyamine, lactobacillus rhamnosus (gg), levothyroxine, loperamide, magnesium, meclizine, multiple vitamins-minerals, nitroglycerin, pantoprazole, potassium, pregabalin, and triamterene-hydrochlorothiazide.  Current Outpatient Prescriptions on File Prior to Visit  Medication Sig  . amLODipine (NORVASC) 2.5 MG tablet Take 1 tablet (2.5 mg total) by mouth daily.  Marland Kitchen aspirin 81 MG tablet Take 81 mg by mouth. Twice a week  . ASTRAGALUS PO Take 500 mg by mouth daily.  . Biotin 1000 MCG tablet Take 1,000 mcg by mouth daily.  . calcium-vitamin D (OSCAL WITH D) 500-200 MG-UNIT tablet Take 1 tablet by mouth.  . diphenhydrAMINE (BENADRYL) 25 mg capsule Take 25 mg by mouth at bedtime.   . hyoscyamine (LEVSIN, ANASPAZ) 0.125 MG tablet Take 1 tablet (0.125 mg total) by mouth 2 (two) times daily as needed.  . Lactobacillus Rhamnosus, GG, (CULTURELLE PO) Take 1 capsule by mouth daily.  Marland Kitchen levothyroxine (SYNTHROID, LEVOTHROID) 50 MCG tablet take 1 tablet by mouth once daily TAKE EXTRA TABLET ON MONDAY AND FRIDAY  . loperamide (IMODIUM) 2 MG capsule Take by mouth as needed for diarrhea or loose stools.  . Magnesium 250 MG TABS Take by mouth daily.  . meclizine (ANTIVERT) 25 MG tablet take 1 tablet by mouth three times a day if needed  . Multiple Vitamins-Minerals (CENTRUM SILVER PO) Take by mouth daily.   . nitroGLYCERIN (NITROSTAT) 0.4 MG SL tablet Place 0.4 mg under the tongue every 5 (five) minutes as needed.  . pantoprazole (PROTONIX) 40 MG tablet Take 1 tablet (40 mg total) by mouth daily.  . Potassium 99 MG TABS Take 2 tablets by mouth daily.  . pregabalin (LYRICA) 75 MG capsule Take 1 capsule (75 mg total) by mouth 3 (three) times daily.  Marland Kitchen triamterene-hydrochlorothiazide  (MAXZIDE-25) 37.5-25 MG tablet take 1 tablet by mouth once daily   No current facility-administered medications on file prior to visit.    ROS  Constitutional: Denies any fever or chills Gastrointestinal: No reported hemesis, hematochezia, vomiting, or acute GI distress Musculoskeletal: Denies any acute onset joint swelling, redness, loss of ROM, or weakness Neurological: No reported episodes of acute onset apraxia, aphasia, dysarthria, agnosia, amnesia, paralysis, loss of coordination, or loss of consciousness  Allergies  Cheyenne Pierce is allergic to codeine; penicillins; prednisone; zoloft [sertraline hcl]; amoxicillin; codeine phosphate; lactose intolerance (gi); metronidazole; perindopril erbumine; potassium-containing compounds; prozac [fluoxetine hcl]; tramadol hcl; and oxcarbazepine.  PFSH  Drug: Cheyenne Pierce  reports that she does not use drugs. Alcohol:  reports that she does not drink alcohol. Tobacco:  reports that she has quit smoking. She has a 30.00 pack-year smoking history. She has never used smokeless tobacco. Medical:  has a past  medical history of Allergy; Angina pectoris ([place]); Anxiety; Cataract; Chronic back pain; DDD (degenerative disc disease), cervical; DDD (degenerative disc disease), cervical; DDD (degenerative disc disease), lumbar; Dental bridge present; Depression; Diverticulosis; GERD (gastroesophageal reflux disease); Headache; Heart murmur; History of bronchitis; History of colon polyps; History of gastric ulcer; History of hiatal hernia; Hypertension; Hypothyroidism; IBS (irritable bowel syndrome); Insomnia; Joint pain; Joint swelling; Mass of vagina; Nocturia; Peripheral neuropathy ([place]); Peripheral neuropathy ([place]); Urinary frequency; Urinary urgency; and Vertigo. Family: family history includes Heart disease in her mother; Stroke in her brother.  Past Surgical History:  Procedure Laterality Date  . ABDOMINAL HYSTERECTOMY  1968   left ovary remains  . ANTERIOR  CERVICAL DECOMPRESSION/DISCECTOMY FUSION 4 LEVELS N/A 06/05/2015   Procedure: ANTERIOR CERVICAL DECOMPRESSION/DISCECTOMY FUSION CERVICAL THREE-FOUR,CERVICAL FOUR-FIVE,CERVICAL FIVE-SIX,CERVICAL SIX-SEVEN;  Surgeon: Earnie Larsson, MD;  Location: [place];  Service: Neurosurgery;  Laterality: N/A;  right side approach  . APPENDECTOMY  1948  . bladder suspension with vaginal sling    . CARDIAC CATHETERIZATION  09/26/1998  . CATARACT EXTRACTION W/PHACO Left 10/15/2015   Procedure: CATARACT EXTRACTION PHACO AND INTRAOCULAR LENS PLACEMENT (IOC) LEFT EYE;  Surgeon: Ronnell Freshwater, MD;  Location: [facility];  Service: Ophthalmology;  Laterality: Left;  LEFT LEAVE PT LAST  . CATARACT EXTRACTION W/PHACO Right 11/26/2015   Procedure: CATARACT EXTRACTION PHACO AND INTRAOCULAR LENS PLACEMENT (IOC);  Surgeon: Ronnell Freshwater, MD;  Location: [facility];  Service: Ophthalmology;  Laterality: Right;  RIGHT  . COLONOSCOPY  07/2009   [facility]), normal per pt  . COLONOSCOPY  06/2014   int hem, rpt 5 yrs ([name])  . COLOSTOMY W/ RECTOCELE REPAIR  06/2004  . DEXA  2013   T -1.0  . epidural infections    . epidural steroid injections  multiple latest 05/2014, 07/2014   help periph neuropathy [name])  . ESOPHAGOGASTRODUODENOSCOPY    . EYE SURGERY Left 10/15/2015   cataract removal  . HERNIA REPAIR    . NM MYOVIEW LTD  2009   WNL per report  . OVARIAN CYST SURGERY  1963   cyst on ovaries  . TONSILLECTOMY    . US ECHOCARDIOGRAPHY  2009   mild aortic/mitral insuff, ER [id number], mild diastolic dysfunction  . VENTRAL HERNIA REPAIR  08/2008   abdominal wall, lysis of adhesions   Constitutional Exam  General appearance: Well nourished, well developed, and well hydrated. In no apparent acute distress Vitals:   03/05/16 1233  BP: (!) 150/79  Pulse: 72  Resp: 16  Temp: 98 F (36.7 C)  TempSrc: Oral  SpO2: 100%  Weight: 130 lb (59 kg)  Height: 5' (1.524 m)   BMI  Assessment: Estimated body mass index is 25.39 kg/m as calculated from the following:   Height as of this encounter: 5' (1.524 m).   Weight as of this encounter: 130 lb (59 kg).  BMI interpretation table: BMI level Category Range association with higher incidence of chronic pain  <18 kg/m2 Underweight   18.5-24.9 kg/m2 Ideal body weight   25-29.9 kg/m2 Overweight Increased incidence by 20%  30-34.9 kg/m2 Obese (Class I) Increased incidence by 68%  35-39.9 kg/m2 Severe obesity (Class II) Increased incidence by 136%  >40 kg/m2 Extreme obesity (Class III) Increased incidence by 254%   BMI Readings from Last 4 Encounters:  03/05/16 25.39 kg/m  01/29/16 25.39 kg/m  01/21/16 25.39 kg/m  01/07/16 25.93 kg/m   Wt Readings from Last 4 Encounters:  03/05/16 130 lb (59 kg)  01/29/16 130 lb (59 kg)  01/21/16 130 lb (59 kg)  01/07/16 132 lb 12 oz (60.2 kg)  Psych/Mental status: Alert, oriented x 3 (person, place, & time) Eyes: PERLA Respiratory: No evidence of acute respiratory distress  Cervical Spine Exam  Inspection: No masses, redness, or swelling Alignment: Symmetrical Functional ROM: Unrestricted ROM Stability: No instability detected Muscle strength & Tone: Functionally intact Sensory: Unimpaired Palpation: Non-contributory  Upper Extremity (UE) Exam    Side: Right upper extremity  Side: Left upper extremity  Inspection: No masses, redness, swelling, or asymmetry  Inspection: No masses, redness, swelling, or asymmetry  Functional ROM: Unrestricted ROM          Functional ROM: Unrestricted ROM          Muscle strength & Tone: Functionally intact  Muscle strength & Tone: Functionally intact  Sensory: Unimpaired  Sensory: Unimpaired  Palpation: Non-contributory  Palpation: Non-contributory   Thoracic Spine Exam  Inspection: No masses, redness, or swelling Alignment: Symmetrical Functional ROM: Unrestricted ROM Stability: No instability detected Sensory:  Unimpaired Muscle strength & Tone: Functionally intact Palpation: Non-contributory  Lumbar Spine Exam  Inspection: No masses, redness, or swelling Alignment: Symmetrical Functional ROM: Unrestricted ROM Stability: No instability detected Muscle strength & Tone: Functionally intact Sensory: Unimpaired Palpation: Non-contributory Provocative Tests: Lumbar Hyperextension and rotation test: evaluation deferred today       Patrick's Maneuver: evaluation deferred today              Gait & Posture Assessment  Ambulation: Unassisted Gait: Relatively normal for age and body habitus Posture: WNL   Lower Extremity Exam    Side: Right lower extremity  Side: Left lower extremity  Inspection: No masses, redness, swelling, or asymmetry  Inspection: No masses, redness, swelling, or asymmetry  Functional ROM: Unrestricted ROM          Functional ROM: Unrestricted ROM          Muscle strength & Tone: Functionally intact  Muscle strength & Tone: Functionally intact  Sensory: Unimpaired  Sensory: Unimpaired  Palpation: Non-contributory  Palpation: Non-contributory   Assessment  Primary Diagnosis & Pertinent Problem List: The primary encounter diagnosis was Chronic pain syndrome. A diagnosis of Spondylosis of cervical region without myelopathy or radiculopathy was also pertinent to this visit.  Visit Diagnosis: 1. Chronic pain syndrome   2. Spondylosis of cervical region without myelopathy or radiculopathy    Plan of Care  Pharmacotherapy (Medications Ordered): Meds ordered this encounter  Medications  . buprenorphine (BUTRANS - DOSED MCG/HR) 5 MCG/HR PTWK patch    Sig: Place 1 patch (5 mcg total) onto the skin once a week.    Dispense:  2 patch    Refill:  0    Do not place this medication, or any other prescription from our practice, on "Automatic Refill". Patient may have prescription filled one day early if pharmacy is closed on scheduled refill date. Do not fill until: 03/05/16 To last  until: 03/20/16   New Prescriptions   BUPRENORPHINE (BUTRANS - DOSED MCG/HR) 5 MCG/HR PTWK PATCH    Place 1 patch (5 mcg total) onto the skin once a week.   Medications administered today: Cheyenne Pierce had no medications administered during this visit. Lab-work, procedure(s), and/or referral(s): No orders of the defined types were placed in this encounter.  Imaging and/or referral(s): None  Interventional therapies: Planned, scheduled, and/or pending:   None at this time.    Considering:   Right cervical facet block under fluoro and IV sedation.  PRN Palliative right cervical epidural steroid injections under fluoroscopic guidance, with a without sedation.  PRN Palliative left L4-5 lumbar epidural steroid injection under fluoroscopic guidance, without sedation    Palliative PRN treatment(s):   PRN Palliative right cervical epidural steroid injections under fluoroscopic guidance, with a without sedation.  PRN Palliative left L4-5 lumbar epidural steroid injection under fluoroscopic guidance, without sedation    Provider-requested follow-up: Return in about 2 weeks (around 03/19/2016) for Med-Mgmt.  Future Appointments Date Time Provider [place]  04/18/2016 12:00 PM Ria Bush, MD [facility]  05/01/2016 1:00 PM Milinda Pointer, MD [facility] None   Primary Care Physician: Ria Bush, MD Location: [facility] Note by: Kathlen Brunswick. [name], M.D, DABA, DABAPM, DABPM, DABIPP, FIPP  Pain Score Disclaimer: We use the NRS-11 scale. This is a self-reported, subjective measurement of pain severity with only modest accuracy. It is used primarily to identify changes within a particular patient. It must be understood that outpatient pain scales are significantly less accurate that those used for research, where they can be applied under ideal controlled circumstances with minimal exposure to variables. In reality, the score is likely  to be a combination of pain intensity and pain affect, where pain affect describes the degree of emotional arousal or changes in action readiness caused by the sensory experience of pain. Factors such as social and work situation, setting, emotional state, anxiety levels, expectation, and prior pain experience may influence pain perception and show large inter-individual differences that may also be affected by time variables.  Patient instructions provided during this appointment: Patient Instructions  Buprenorphine transdermal patch What is this medicine? BUPRENORPHINE (byoo pre NOR feen) is a pain reliever. It is used to treat moderate to severe pain. COMMON BRAND NAME(S): Butrans What should I tell my health care provider before I take this medicine? They need to know if you have any of these conditions: -blockage in your bowel -brain tumor -drink more than 3 alcohol-containing drinks per day -drug abuse or addiction -fever -head injury -kidney disease -liver disease -lung or breathing disease, like asthma -thyroid disease -trouble passing urine or change in the amount of urine -an unusual or allergic reaction to buprenorphine, other medicines, foods, dyes, or preservatives -pregnant or trying to get pregnant -breast-feeding How should I use this medicine? Apply the patch to your skin. Do not cut or damage the patch. A cut or damaged patch can be very dangerous because you may get too much medicine. Select a clean, dry area of skin on your upper outer arm, upper chest, upper back, or the side of the chest. Do not apply the patch to broken, burned, cut, or irritated skin. Use only water to clean the area. Do not use soap or alcohol to clean the skin because this can increase the effects of the medicine. If the area is hairy, clip the hair with scissors, but do not shave. Take the patch out of its wrapper. Bend the patch along the faint line and slowly peel the outer portion of the liner,  which covers the sticky surface of the patch. Press the patch onto the skin and slowly peel off the protective liner. Do not use a patch if the packaging or backing is damaged. Do not touch the sticky part with your fingers. Press the patch to the skin using the palm of your hand. Press the patch to the skin for 15 seconds. Wash your hands at once. Keep patches far away from children. Do not  let children see you apply the patch and do not apply it where children can see it. Do not call the patch a sticker, tattoo, or bandage, as this could encourage the child to mimic your actions. Used patches still contain medicine. Children or pets can have serious side effects or die from putting used patches in their mouth or on their bodies. Take off the old patch before putting on a new patch. Apply each new patch to a different area of skin. If a patch comes off or causes irritation, remove it and apply a new patch to a different site. If the edges of the patch start to loosen, first apply first aid tape to the edges of the patch. If problems with the patch not sticking continue, cover the patch with a see-through adhesive dressing (like Bioclusive or Tegaderm). Never cover the patch with any other bandage or tape. To get rid of used patches, fold the patch in half with the sticky sides together. Then, flush it down the toilet. Alternately, you may dispose of the patch in the Patch-Disposal Unit provided. Never throw the patch away in the trash without sealing it in the Patch-Disposal unit. Replace the patch every 7 days. Follow the directions on the prescription label. Do not use more medicine than you are told to use. A special MedGuide will be given to you by the pharmacist with each prescription and refill. Be sure to read this information carefully each time. Talk to your pediatrician regarding the use of this medicine in children. Special care may be needed. If a patch accidentally touches the skin, use only water  to clean the area. Do not use soap or alcohol to clean the skin because this can increase the effects of the medicine. If someone accidentally uses a buprenorphine patch and is not awake and alert, immediately call 911 for help. If the person is awake and alert, call a doctor, health care professional, or the [employer]. What if I miss a dose? If you forget to replace your patch, take off the old patch and put on a new patch as soon as you can. Do not apply an extra patch to your skin. Do not wear more than one patch at the same time unless told to do so by your doctor or health care professional. What may interact with this medicine? Do not take this medication with any of the following medicines: -cisapride -certain medicines for fungal infections like ketoconazole and itraconazole -dofetilide -dronedarone -pimozide -ritonavir -thioridazine -ziprasidone This medicine may interact with the following medications: -alcohol -antihistamines for allergy, cough and cold -antiviral medicines for HIV or AIDS -atropine -certain antibiotics like clarithromycin, erythromycin, linezold, rifampin -certain medicines for anxiety or sleep -certain medicines for bladder problems like oxybutynin, tolterodine -certain medicines for depression like amitriptyline, fluoxetine, sertraline -certain medicines for migraine headache like almotriptan, eletriptan, frovatriptan, naratriptan, rizatriptan, sumatriptan, zolmitriptan -certain medicines for nausea or vomiting like dolasetron, ondansetron, palonosetron -certain medicines for Parkinson's disease like benztropine, trihexyphenidyl -certain medicines for seizures like phenobarbital, primidone -certain medicines for stomach problems like cimetidine, dicyclomine, hyoscyamine -certain medicines for travel sickness like scopolamine -diuretics -general anesthetics like halothane, isoflurane, methoxyflurane, propofol -ipratropium -local anesthetics  like lidocaine, pramoxine, tetracaine -MAOIs like Carbex, Eldepryl, Marplan, Nardil, and Parnate -medicines that relax muscles for surgery -methylene blue -other medicines that prolong the QT interval (cause an abnormal heart rhythm) -other narcotic medicines for pain or cough -phenothiazines like chlorpromazine, mesoridazine, prochlorperazine, thioridazine What should I watch for while  using this medicine? Other pain medicine may be needed when you first start using the patch because the patch can take some time to start working. Tell your doctor or health care professional if your pain does not go away, if it gets worse, or if you have new or a different type of pain. You may develop tolerance to the medicine. Tolerance means that you will need a higher dose of the medicine for pain relief. Tolerance is normal and is expected if you take the medicine for a long time. Do not suddenly stop taking your medicine because you may develop a severe reaction. Your body becomes used to the medicine. This does NOT mean you are addicted. Addiction is a behavior related to getting and using a drug for a non-medical reason. If you have pain, you have a medical reason to take pain medicine. Your doctor will tell you how much medicine to take. If your doctor wants you to stop the medicine, the dose will be slowly lowered over time to avoid any side effects. If you are also taking a narcotic medicine for pain or cough or another medicine that also causes drowsiness, you may have more side effects. Give your health care provider a list of all medicines you use. Your doctor will tell you how much medicine to take. Do not take more medicine than directed. Call emergency for help if you have problems breathing or unusual sleepiness. You may get drowsy or dizzy. Do not drive, use machinery, or do anything that needs mental alertness until you know how this medicine affects you. Do not stand or sit up quickly, especially if  you are an older patient. This reduces the risk of dizzy or fainting spells. Alcohol may interfere with the effect of this medicine. Avoid alcoholic drinks. The medicine will cause constipation. Try to have a bowel movement at least every 2 to 3 days. If you do not have a bowel movement for 3 days, call your doctor or health care professional. Your mouth may get dry. Chewing sugarless gum or sucking hard candy, and drinking plenty of water may help. Contact your doctor if the problem does not go away or is severe. Heat can increase the amount of medicine released from the patch. Do not get the patch hot by using heating pads, heated water beds, electric blankets, and heat lamps. You can bathe or swim while using the patch. But, do not use a sauna or hot tub. Tell you doctor or health care professional if you get a fever. What side effects may I notice from receiving this medicine? Side effects that you should report to your doctor or health care professional as soon as possible: -allergic reactions like skin rash, itching or hives, swelling of the face, lips, or tongue -breathing problems -confusion -signs and symptoms of a dangerous change in heartbeat or heart rhythm like chest pain; dizziness; fast or irregular heartbeat; palpitations; feeling faint or lightheaded, falls; breathing problems -signs and symptoms of liver injury like dark yellow or brown urine; general ill feeling or flu-like symptoms; light-colored stools; loss of appetite; nausea; right upper belly pain; unusually weak or tired; yellow of the eyes or skin -signs and symptoms of low blood pressure like dizziness; feeling faint or lightheaded, falls; unusually weak or tired -trouble passing urine or change in the amount of urine Side effects that usually do not require medical attention (report to your doctor or health care professional if they continue or are bothersome): -constipation -dry mouth -  itching, redness, or rash at the  patch site -nausea, vomiting -tiredness Where should I keep my medicine? Keep out of the reach of children. This medicine can be abused. Keep your medicine in a safe place to protect it from theft. Do not share this medicine with anyone. Selling or giving away this medicine is dangerous and against the law. Store at room temperature between 59 and 86 degrees F (15 and 30 degrees C). Do not store the patches out of their wrappers. This medicine may cause accidental overdose and death if it is taken by other adults, children, or pets. Flush any unused medicine down the toilet as instructed above to reduce the chance of harm. Alternately, you may dispose of the patch in the Patch-Disposal Unit provided. Do not use the medicine after the expiration date.  2017 [employer] Standard (2015-04-26 10:13:00)

## 2016-03-07 ENCOUNTER — Telehealth: Payer: Self-pay | Admitting: Pain Medicine

## 2016-03-07 NOTE — Telephone Encounter (Signed)
Patient has not started new meds right now due to financial difficulties paying for meds Having to pay high co pay for Lyrica, would appreciate it if we could get her a number to call company and get some help with the cost of her meds, please call

## 2016-03-07 NOTE — Telephone Encounter (Signed)
Called patient and instructed her to call Medication Management at [phone]

## 2016-04-11 ENCOUNTER — Other Ambulatory Visit (HOSPITAL_COMMUNITY): Payer: Self-pay | Admitting: Neurosurgery

## 2016-04-11 DIAGNOSIS — R52 Pain, unspecified: Secondary | ICD-10-CM

## 2016-04-18 ENCOUNTER — Ambulatory Visit: Payer: Medicare Other | Admitting: Family Medicine

## 2016-04-21 ENCOUNTER — Ambulatory Visit
Admission: RE | Admit: 2016-04-21 | Discharge: 2016-04-21 | Disposition: A | Discharge status: Home / Self Care | Payer: Medicare Other | Source: Ambulatory Visit | Attending: Neurosurgery | Admitting: Neurosurgery

## 2016-04-21 DIAGNOSIS — M5134 Other intervertebral disc degeneration, thoracic region: Secondary | ICD-10-CM | POA: Insufficient documentation

## 2016-04-21 DIAGNOSIS — R52 Pain, unspecified: Secondary | ICD-10-CM | POA: Diagnosis not present

## 2016-04-21 DIAGNOSIS — M5126 Other intervertebral disc displacement, lumbar region: Secondary | ICD-10-CM | POA: Diagnosis not present

## 2016-04-21 DIAGNOSIS — M48061 Spinal stenosis, lumbar region without neurogenic claudication: Secondary | ICD-10-CM | POA: Diagnosis not present

## 2016-04-21 DIAGNOSIS — M5124 Other intervertebral disc displacement, thoracic region: Secondary | ICD-10-CM | POA: Diagnosis not present

## 2016-04-21 DIAGNOSIS — M542 Cervicalgia: Secondary | ICD-10-CM | POA: Diagnosis not present

## 2016-04-21 DIAGNOSIS — M4802 Spinal stenosis, cervical region: Secondary | ICD-10-CM | POA: Insufficient documentation

## 2016-04-21 DIAGNOSIS — K8020 Calculus of gallbladder without cholecystitis without obstruction: Secondary | ICD-10-CM | POA: Diagnosis not present

## 2016-04-30 ENCOUNTER — Encounter: Payer: Medicare Other | Admitting: Pain Medicine

## 2016-05-01 ENCOUNTER — Encounter: Payer: Self-pay | Admitting: Pain Medicine

## 2016-05-01 ENCOUNTER — Ambulatory Visit: Payer: Medicare Other | Attending: Pain Medicine | Admitting: Pain Medicine

## 2016-05-01 VITALS — BP 138/73 | HR 74 | Temp 98.0°F | Resp 18 | Ht 60.0 in | Wt 135.0 lb

## 2016-05-01 DIAGNOSIS — R7303 Prediabetes: Secondary | ICD-10-CM | POA: Diagnosis not present

## 2016-05-01 DIAGNOSIS — G8929 Other chronic pain: Secondary | ICD-10-CM | POA: Diagnosis not present

## 2016-05-01 DIAGNOSIS — M4802 Spinal stenosis, cervical region: Secondary | ICD-10-CM | POA: Insufficient documentation

## 2016-05-01 DIAGNOSIS — M5412 Radiculopathy, cervical region: Secondary | ICD-10-CM | POA: Diagnosis not present

## 2016-05-01 DIAGNOSIS — F411 Generalized anxiety disorder: Secondary | ICD-10-CM | POA: Insufficient documentation

## 2016-05-01 DIAGNOSIS — R531 Weakness: Secondary | ICD-10-CM | POA: Diagnosis not present

## 2016-05-01 DIAGNOSIS — M25551 Pain in right hip: Secondary | ICD-10-CM | POA: Insufficient documentation

## 2016-05-01 DIAGNOSIS — Z5181 Encounter for therapeutic drug level monitoring: Secondary | ICD-10-CM | POA: Diagnosis not present

## 2016-05-01 DIAGNOSIS — Z8719 Personal history of other diseases of the digestive system: Secondary | ICD-10-CM | POA: Insufficient documentation

## 2016-05-01 DIAGNOSIS — Z79899 Other long term (current) drug therapy: Secondary | ICD-10-CM | POA: Diagnosis not present

## 2016-05-01 DIAGNOSIS — M5416 Radiculopathy, lumbar region: Secondary | ICD-10-CM | POA: Diagnosis not present

## 2016-05-01 DIAGNOSIS — Z87891 Personal history of nicotine dependence: Secondary | ICD-10-CM | POA: Diagnosis not present

## 2016-05-01 DIAGNOSIS — R200 Anesthesia of skin: Secondary | ICD-10-CM | POA: Diagnosis not present

## 2016-05-01 DIAGNOSIS — M797 Fibromyalgia: Secondary | ICD-10-CM

## 2016-05-01 DIAGNOSIS — M5116 Intervertebral disc disorders with radiculopathy, lumbar region: Secondary | ICD-10-CM | POA: Diagnosis not present

## 2016-05-01 DIAGNOSIS — M792 Neuralgia and neuritis, unspecified: Secondary | ICD-10-CM

## 2016-05-01 DIAGNOSIS — Z9889 Other specified postprocedural states: Secondary | ICD-10-CM | POA: Insufficient documentation

## 2016-05-01 DIAGNOSIS — Z7982 Long term (current) use of aspirin: Secondary | ICD-10-CM | POA: Diagnosis not present

## 2016-05-01 DIAGNOSIS — Z90710 Acquired absence of both cervix and uterus: Secondary | ICD-10-CM | POA: Insufficient documentation

## 2016-05-01 DIAGNOSIS — Z86010 Personal history of colonic polyps: Secondary | ICD-10-CM | POA: Insufficient documentation

## 2016-05-01 MED ORDER — PREGABALIN 100 MG PO CAPS: 100.0000 mg | ORAL_CAPSULE | Freq: Three times a day (TID) | ORAL | 0 refills | Status: DC

## 2016-05-01 NOTE — Progress Notes (Signed)
Patient's Name: Cheyenne Pierce  MRN: [id number]  Referring Provider: Ria Bush, MD  DOB: [birth year]  PCP: Ria Bush, MD  DOS: 05/01/2016  Note by: Kathlen Brunswick. [name], MD  Service setting: Ambulatory outpatient  Specialty: Interventional Pain Management  Location: [facility] (AMB) Pain Management Facility    Patient type: Established   Primary Reason(s) for Visit: Encounter for prescription drug management (Level of risk: moderate) CC: Back Pain (low)  HPI  Cheyenne Pierce is a 82 y.o. year old, female patient, who comes today for a medication management evaluation. She has Hypothyroidism; GAD (generalized anxiety disorder); Hereditary and idiopathic peripheral neuropathy; Essential hypertension; Reflux esophagitis; GERD; INGUINAL HERNIA, LEFT; Irritable bowel syndrome; OTHER CHRONIC CYSTITIS; Osteopenia; Systolic murmur; Medicare annual wellness visit, subsequent; PVCs (premature ventricular contractions); Prediabetes; Mood swings ([place]); Memory deficit; Advanced care planning/counseling discussion; HLD (hyperlipidemia); Health maintenance examination; Recurrent falls; Night sweats; Chronic pain syndrome; Encounter for therapeutic drug level monitoring; Encounter for chronic pain management; Chronic low back pain; Chronic neck pain (R>L); Cervical spondylosis; Chronic cervical radicular pain (Bilateral) (R>L); Numbness of upper extremity (Bilateral) (R>L); Upper extremity weakness (Bilateral); Neurogenic pain; Neuropathic pain; Inflammatory pain; Chronic Lumbar Radicular pain (Left); Allodynia; Encounter for long-term current use of medication; Lumbar spondylosis; Chronic Lumbar Radiculopathy (Left); Chronic lower extremity pain (Bilateral) (L>R); Chronic hip pain (Right); Abnormal Cervical MRI (01/24/15); Cervical spinal stenosis (C3 through C7); Cervical facet hypertrophy; Cervical foraminal stenosis (C3-4 through C6-7) (R>L); Chronic cervical radiculopathy (Bilateral) (R>L) (pain, numbness, and  weakness); Abnormal Lumbar MRI (11/10/2014); Lumbar spinal stenosis (L3-4); Lumbar foraminal stenosis (Bilateral L3-4) (Right L4-5); MDD (major depressive disorder), recurrent episode, moderate ([place]); Fibromyalgia; Neck pain on right side; and Cervical facet syndrome on her problem list. Her primarily concern today is the Back Pain (low)  Pain Assessment: Self-Reported Pain Score: 4 /10             Reported level is compatible with observation.       Pain Type: Chronic pain Pain Location: Back Pain Orientation: Lower Pain Descriptors / Indicators: Aching (stinging) Pain Frequency: Intermittent  Cheyenne Pierce was last seen on 03/07/2016 for medication management. During today's appointment we reviewed Cheyenne Pierce's chronic pain status, as well as her outpatient medication regimen. Pending evaluation by Dr. Deri Fuelling next Tuesday Jan 30th, 4:15PM. He will be evaluating her for the lower back. He did her cervical surgery Feb 28th, 2017. She is about 75% better since her surgery.  The patient  reports that she does not use drugs. Her body mass index is 26.37 kg/m.  Further details on both, my assessment(s), as well as the proposed treatment plan, please see below.  Controlled Substance Pharmacotherapy Assessment REMS (Risk Evaluation and Mitigation Strategy)  Analgesic: No opioids MME/day: 0 mg/day.  Zenovia Jarred, RN  05/01/2016  1:09 PM  Signed Safety precautions to be maintained throughout the outpatient stay will include: orient to surroundings, keep bed in low position, maintain call bell within reach at all times, provide assistance with transfer out of bed and ambulation.  Monitoring: [place] PMP: Online review of the past 64-month period conducted. Compliant with practice rules and regulations List of all UDS test(s) done:  Lab Results  Component Value Date   TOXASSSELUR FINAL 03/19/2015   Last UDS on record: [employer] Select 13  Date Value Ref Range Status  03/19/2015 FINAL  Final     Comment:    ==================================================================== TOXASSURE SELECT 13 (MW) ==================================================================== Test  Result       Flag       Units Drug Present not Declared for Prescription Verification   Oxazepam                       612          UNEXPECTED ng/mg creat   Temazepam                      400          UNEXPECTED ng/mg creat    Oxazepam and temazepam are expected metabolites of diazepam.    Oxazepam is also an expected metabolite of other benzodiazepine    drugs, including chlordiazepoxide, prazepam, clorazepate,    halazepam, and temazepam.  Oxazepam and temazepam are available    as scheduled prescription medications. ==================================================================== Test                      Result    Flag   Units      Ref Range   Creatinine              26               mg/dL      >=20 ==================================================================== Declared Medications:  The flagging and interpretation on this report are based on the  following declared medications.  Unexpected results may arise from  inaccuracies in the declared medications.  **Note: The testing scope of this panel does not include following  reported medications:  Bupropion (Wellbutrin)  Gabapentin (Neurontin) ==================================================================== For clinical consultation, please call [phone]. ====================================================================    Pharmacologic Plan: No opioid therapy  Laboratory Chemistry  Inflammation Markers Lab Results  Component Value Date   ESRSEDRATE 11 01/07/2016   CRP 0.7 09/12/2014   Renal Function Lab Results  Component Value Date   BUN 10 01/07/2016   CREATININE 0.64 01/07/2016   GFRAA >60 05/22/2015   GFRNONAA >60 05/22/2015   Hepatic Function Lab Results  Component Value Date   AST  27 02/07/2015   ALT 21 02/07/2015   ALBUMIN 4.0 02/07/2015   Electrolytes Lab Results  Component Value Date   NA 135 01/07/2016   K 3.8 01/07/2016   CL 98 01/07/2016   CALCIUM 9.4 01/07/2016   Pain Modulating Vitamins Lab Results  Component Value Date   VD25OH 47.39 10/04/2013   VITAMINB12 >1500 (H) 09/22/2012   Coagulation Parameters Lab Results  Component Value Date   INR 1.0 11/08/2012   LABPROT 13.8 11/08/2012   APTT 32.6 11/08/2012   PLT 337.0 01/07/2016   Cardiovascular Lab Results  Component Value Date   HGB 13.8 01/07/2016   HCT 40.9 01/07/2016   Note: Lab results reviewed.  Recent Diagnostic Imaging Review  Mr Cervical Spine Wo Contrast  Result Date: 04/21/2016 CLINICAL DATA:  Neck pain.  Cervical fusion EXAM: MRI CERVICAL SPINE WITHOUT CONTRAST TECHNIQUE: Multiplanar, multisequence MR imaging of the cervical spine was performed. No intravenous contrast was administered. COMPARISON:  CT cervical spine 09/26/2015, MRI cervical spine 01/24/2015 outside study FINDINGS: Alignment: Normal Vertebrae: ACDF with anterior plate and interbody bone grafts C3 through C7 causing artifact. Negative for fracture or mass lesion. Cord: Normal signal and morphology Posterior Fossa, vertebral arteries, paraspinal tissues: No fluid collection. No mass or adenopathy in the neck. Posterior fossa negative Disc levels: C2-3:  Negative C3-4:  ACDF.  No significant stenosis. C4-5:  ACDF.  No significant stenosis C5-6:  ACDF.  No significant stenosis C6-7:  ACDF without significant stenosis C7-T1: Mild disc and facet degeneration. Facet overgrowth is contributing to moderate foraminal narrowing bilaterally. IMPRESSION: ACDF at C3 through C7 without significant stenosis Bilateral foraminal stenosis C7-T1 due to disc and facet degeneration. Electronically Signed   By: Franchot Gallo M.D.   On: 04/21/2016 14:54   Mr Thoracic Spine Wo Contrast  Result Date: 04/21/2016 CLINICAL DATA:  Back pain  EXAM: MRI THORACIC SPINE WITHOUT CONTRAST TECHNIQUE: Multiplanar, multisequence MR imaging of the thoracic spine was performed. No intravenous contrast was administered. COMPARISON:  None. FINDINGS: Alignment:  Mild anterior slip T3-4 and T4-5.  Mild kyphosis at T6. Vertebrae: Negative for fracture or mass. Cord:  Normal signal and morphology Paraspinal and other soft tissues: Negative Disc levels: T1-2:  Mild degenerative change without stenosis T2-3:  Mild disc degeneration with disc bulging and spurring. T3-4: Mild anterior slip. Diffuse disc bulging and spurring. No cord deformity T4-5: Mild anterior slip.  Mild degenerative change without stenosis T5-6:  Mild degenerative change T6-7:  Mild degenerative change T7-8:  Mild disc degeneration T8-9:  Mild disc degeneration T9-10: Disc degeneration with disc bulging and spurring. No significant stenosis T10-11:  Negative T11-12: Negative T12-L1: Negative IMPRESSION: Diffuse thoracic disc degeneration. No focal disc protrusion or spinal stenosis. No cord compression. Electronically Signed   By: Franchot Gallo M.D.   On: 04/21/2016 15:16   Mr Lumbar Spine Wo Contrast  Result Date: 04/21/2016 CLINICAL DATA:  Back pain EXAM: MRI LUMBAR SPINE WITHOUT CONTRAST TECHNIQUE: Multiplanar, multisequence MR imaging of the lumbar spine was performed. No intravenous contrast was administered. COMPARISON:  Outside lumbar MRI 11/10/2014.  MRI 10/29/2009 FINDINGS: Segmentation:  Lowest disc space L5-S1. Alignment: Mild retrolisthesis L1-2 and L2-3. 5 mm anterolisthesis L4-5 Vertebrae:  Negative for fracture or mass. Conus medullaris: Extends to the T12-L1 level and appears normal. Paraspinal and other soft tissues: Negative for mass or adenopathy. Multiple small gallstones. Disc levels: L1-2: Moderate to advanced disc degeneration with disc space narrowing and disc bulging. No significant stenosis L2-3: Mild retrolisthesis. Mild disc and facet degeneration without stenosis L3-4:  Mild disc bulging and mild facet degeneration. Mild spinal stenosis. Small extraforaminal disc protrusion on the left. No significant foraminal stenosis L4-5: 5 mm anterolisthesis. Moderate facet degeneration. Extraforaminal disc protrusion on the right with impingement of the right L4 nerve root in the foramen which is flattened. Disc protrusion and neural impingement is unchanged from the prior study. Mild spinal stenosis unchanged from the prior study. L5-S1: Negative IMPRESSION: Mild spinal stenosis L3-4. Small extraforaminal disc protrusion on the left without significant neural impingement. 5 mm anterolisthesis L4-5 unchanged from 2016. Right extraforaminal disc protrusion and impingement of the right L4 nerve root unchanged from the prior MRI. Cholelithiasis Electronically Signed   By: Franchot Gallo M.D.   On: 04/21/2016 15:12   Note: Imaging results reviewed.          Meds  The patient has a current medication list which includes the following prescription(s): amlodipine, aspirin, astragalus, biotin, calcium-vitamin d, diphenhydramine, hyoscyamine, lactobacillus rhamnosus (gg), levothyroxine, loperamide, magnesium, meclizine, multiple vitamins-minerals, nitroglycerin, pantoprazole, potassium, pregabalin, and triamterene-hydrochlorothiazide.  Current Outpatient Prescriptions on File Prior to Visit  Medication Sig  . amLODipine (NORVASC) 2.5 MG tablet Take 1 tablet (2.5 mg total) by mouth daily.  Marland Kitchen aspirin 81 MG tablet Take 81 mg by mouth. Twice a week  . ASTRAGALUS PO Take 500 mg by mouth daily.  . Biotin 1000 MCG tablet Take  1,000 mcg by mouth daily.  . calcium-vitamin D (OSCAL WITH D) 500-200 MG-UNIT tablet Take 1 tablet by mouth.  . diphenhydrAMINE (BENADRYL) 25 mg capsule Take 25 mg by mouth at bedtime.   . hyoscyamine (LEVSIN, ANASPAZ) 0.125 MG tablet Take 1 tablet (0.125 mg total) by mouth 2 (two) times daily as needed.  . Lactobacillus Rhamnosus, GG, (CULTURELLE PO) Take 1 capsule by  mouth daily.  Marland Kitchen levothyroxine (SYNTHROID, LEVOTHROID) 50 MCG tablet take 1 tablet by mouth once daily TAKE EXTRA TABLET ON MONDAY AND FRIDAY  . loperamide (IMODIUM) 2 MG capsule Take by mouth as needed for diarrhea or loose stools.  . Magnesium 250 MG TABS Take by mouth daily.  . meclizine (ANTIVERT) 25 MG tablet take 1 tablet by mouth three times a day if needed  . Multiple Vitamins-Minerals (CENTRUM SILVER PO) Take by mouth daily.   . nitroGLYCERIN (NITROSTAT) 0.4 MG SL tablet Place 0.4 mg under the tongue every 5 (five) minutes as needed.  . pantoprazole (PROTONIX) 40 MG tablet Take 1 tablet (40 mg total) by mouth daily.  . Potassium 99 MG TABS Take 2 tablets by mouth daily.  Marland Kitchen triamterene-hydrochlorothiazide (MAXZIDE-25) 37.5-25 MG tablet take 1 tablet by mouth once daily   No current facility-administered medications on file prior to visit.    ROS  Constitutional: Denies any fever or chills Gastrointestinal: No reported hemesis, hematochezia, vomiting, or acute GI distress Musculoskeletal: Denies any acute onset joint swelling, redness, loss of ROM, or weakness Neurological: No reported episodes of acute onset apraxia, aphasia, dysarthria, agnosia, amnesia, paralysis, loss of coordination, or loss of consciousness  Allergies  Cheyenne Pierce is allergic to codeine; penicillins; prednisone; zoloft [sertraline hcl]; amoxicillin; codeine phosphate; lactose intolerance (gi); metronidazole; perindopril erbumine; potassium-containing compounds; prozac [fluoxetine hcl]; tramadol hcl; and oxcarbazepine.  PFSH  Drug: Cheyenne Pierce  reports that she does not use drugs. Alcohol:  reports that she does not drink alcohol. Tobacco:  reports that she has quit smoking. She has a 30.00 pack-year smoking history. She has never used smokeless tobacco. Medical:  has a past medical history of Allergy; Angina pectoris ([place]); Anxiety; Cataract; Chronic back pain; DDD (degenerative disc disease), cervical; DDD  (degenerative disc disease), cervical; DDD (degenerative disc disease), lumbar; Dental bridge present; Depression; Diverticulosis; GERD (gastroesophageal reflux disease); Headache; Heart murmur; History of bronchitis; History of colon polyps; History of gastric ulcer; History of hiatal hernia; Hypertension; Hypothyroidism; IBS (irritable bowel syndrome); Insomnia; Joint pain; Joint swelling; Mass of vagina; Nocturia; Peripheral neuropathy ([place]); Peripheral neuropathy ([place]); Urinary frequency; Urinary urgency; and Vertigo. Family: family history includes Heart disease in her mother; Stroke in her brother.  Past Surgical History:  Procedure Laterality Date  . ABDOMINAL HYSTERECTOMY  1968   left ovary remains  . ANTERIOR CERVICAL DECOMPRESSION/DISCECTOMY FUSION 4 LEVELS N/A 06/05/2015   Procedure: ANTERIOR CERVICAL DECOMPRESSION/DISCECTOMY FUSION CERVICAL THREE-FOUR,CERVICAL FOUR-FIVE,CERVICAL FIVE-SIX,CERVICAL SIX-SEVEN;  Surgeon: Earnie Larsson, MD;  Location: [place] [facility];  Service: Neurosurgery;  Laterality: N/A;  right side approach  . APPENDECTOMY  1948  . bladder suspension with vaginal sling    . CARDIAC CATHETERIZATION  09/26/1998  . CATARACT EXTRACTION W/PHACO Left 10/15/2015   Procedure: CATARACT EXTRACTION PHACO AND INTRAOCULAR LENS PLACEMENT (IOC) LEFT EYE;  Surgeon: Ronnell Freshwater, MD;  Location: [place];  Service: Ophthalmology;  Laterality: Left;  LEFT LEAVE PT LAST  . CATARACT EXTRACTION W/PHACO Right 11/26/2015   Procedure: CATARACT EXTRACTION PHACO AND INTRAOCULAR LENS PLACEMENT (IOC);  Surgeon: Ronnell Freshwater, MD;  Location: [place];  Service: Ophthalmology;  Laterality: Right;  RIGHT  . COLONOSCOPY  07/2009   [facility]), normal per pt  . COLONOSCOPY  06/2014   int hem, rpt 5 yrs ([name])  . COLOSTOMY W/ RECTOCELE REPAIR  06/2004  . DEXA  2013   T -1.0  . epidural infections    . epidural steroid injections  multiple latest 05/2014,  07/2014   help periph neuropathy [name])  . ESOPHAGOGASTRODUODENOSCOPY    . EYE SURGERY Left 10/15/2015   cataract removal  . HERNIA REPAIR    . NM MYOVIEW LTD  2009   WNL per report  . OVARIAN CYST SURGERY  1963   cyst on ovaries  . TONSILLECTOMY    . US ECHOCARDIOGRAPHY  2009   mild aortic/mitral insuff, ER [id number], mild diastolic dysfunction  . VENTRAL HERNIA REPAIR  08/2008   abdominal wall, lysis of adhesions   Constitutional Exam  General appearance: Well nourished, well developed, and well hydrated. In no apparent acute distress Vitals:   05/01/16 1304  BP: 138/73  Pulse: 74  Resp: 18  Temp: 98 F (36.7 C)  SpO2: 100%  Weight: 135 lb (61.2 kg)  Height: 5' (1.524 m)   BMI Assessment: Estimated body mass index is 26.37 kg/m as calculated from the following:   Height as of this encounter: 5' (1.524 m).   Weight as of this encounter: 135 lb (61.2 kg).  BMI interpretation table: BMI level Category Range association with higher incidence of chronic pain  <18 kg/m2 Underweight   18.5-24.9 kg/m2 Ideal body weight   25-29.9 kg/m2 Overweight Increased incidence by 20%  30-34.9 kg/m2 Obese (Class I) Increased incidence by 68%  35-39.9 kg/m2 Severe obesity (Class II) Increased incidence by 136%  >40 kg/m2 Extreme obesity (Class III) Increased incidence by 254%   BMI Readings from Last 4 Encounters:  05/01/16 26.37 kg/m  03/05/16 25.39 kg/m  01/29/16 25.39 kg/m  01/21/16 25.39 kg/m   Wt Readings from Last 4 Encounters:  05/01/16 135 lb (61.2 kg)  03/05/16 130 lb (59 kg)  01/29/16 130 lb (59 kg)  01/21/16 130 lb (59 kg)  Psych/Mental status: Alert, oriented x 3 (person, place, & time)       Eyes: PERLA Respiratory: No evidence of acute respiratory distress  Cervical Spine Exam  Inspection: No masses, redness, or swelling Alignment: Symmetrical Functional ROM: Unrestricted ROM Stability: No instability detected Muscle strength & Tone: Functionally  intact Sensory: Unimpaired Palpation: Non-contributory  Upper Extremity (UE) Exam    Side: Right upper extremity  Side: Left upper extremity  Inspection: No masses, redness, swelling, or asymmetry  Inspection: No masses, redness, swelling, or asymmetry  Functional ROM: Unrestricted ROM          Functional ROM: Unrestricted ROM          Muscle strength & Tone: Functionally intact  Muscle strength & Tone: Functionally intact  Sensory: Unimpaired  Sensory: Unimpaired  Palpation: Non-contributory  Palpation: Non-contributory   Thoracic Spine Exam  Inspection: No masses, redness, or swelling Alignment: Symmetrical Functional ROM: Unrestricted ROM Stability: No instability detected Sensory: Unimpaired Muscle strength & Tone: Functionally intact Palpation: Non-contributory  Lumbar Spine Exam  Inspection: No masses, redness, or swelling Alignment: Symmetrical Functional ROM: Unrestricted ROM Stability: No instability detected Muscle strength & Tone: Functionally intact Sensory: Unimpaired Palpation: Non-contributory Provocative Tests: Lumbar Hyperextension and rotation test: evaluation deferred today       Patrick's Maneuver: evaluation deferred today  Gait & Posture Assessment  Ambulation: Patient ambulates using a walker Gait: Antalgic Posture: WNL   Lower Extremity Exam    Side: Right lower extremity  Side: Left lower extremity  Inspection: No masses, redness, swelling, or asymmetry  Inspection: No masses, redness, swelling, or asymmetry  Functional ROM: Unrestricted ROM          Functional ROM: Unrestricted ROM          Muscle strength & Tone: Functionally intact  Muscle strength & Tone: Functionally intact  Sensory: Unimpaired  Sensory: Unimpaired  Palpation: Non-contributory  Palpation: Non-contributory   Assessment  Primary Diagnosis & Pertinent Problem List: The primary encounter diagnosis was Fibromyalgia. Diagnoses of Neurogenic pain, Chronic Lumbar  Radiculopathy (Left), Chronic Lumbar Radicular pain (Left), Chronic cervical radiculopathy (Bilateral) (R>L) (pain, numbness, and weakness), and Chronic cervical radicular pain (Bilateral) (R>L) were also pertinent to this visit.  Status Diagnosis  Controlled Controlled Controlled 1. Fibromyalgia   2. Neurogenic pain   3. Chronic Lumbar Radiculopathy (Left)   4. Chronic Lumbar Radicular pain (Left)   5. Chronic cervical radiculopathy (Bilateral) (R>L) (pain, numbness, and weakness)   6. Chronic cervical radicular pain (Bilateral) (R>L)      Plan of Care  Pharmacotherapy (Medications Ordered): Meds ordered this encounter  Medications  . pregabalin (LYRICA) 100 MG capsule    Sig: Take 1 capsule (100 mg total) by mouth 3 (three) times daily.    Dispense:  90 capsule    Refill:  0    Do not place this medication, or any other prescription from our practice, on "Automatic Refill". Patient may have prescription filled one day early if pharmacy is closed on scheduled refill date.   New Prescriptions   PREGABALIN (LYRICA) 100 MG CAPSULE    Take 1 capsule (100 mg total) by mouth 3 (three) times daily.   Medications administered today: Cheyenne Pierce had no medications administered during this visit. Lab-work, procedure(s), and/or referral(s): Orders Placed This Encounter  Procedures  . Lumbar Epidural Injection  . CERVICAL FACET (MEDIAL BRANCH NERVE BLOCK)    Imaging and/or referral(s): None  Interventional therapies: Planned, scheduled, and/or pending:   Not at this time.   Considering:   Right cervical facet block under fluoro and IV sedation. PRN Palliative right cervical epidural steroid injections under fluoroscopic guidance, with a without sedation.  PRN Palliative left L4-5 lumbar epidural steroid injection under fluoroscopic guidance, without sedation    Palliative PRN treatment(s):   PRN Palliative right cervical epidural steroid injections under fluoroscopic guidance, with  a without sedation.  PRN Palliative left L4-5 lumbar epidural steroid injection under fluoroscopic guidance, without sedation    Provider-requested follow-up: Return in about 1 month (around 06/01/2016) for (MD) Med-Mgmt, evaluation of new medication trial/regimen, in addition, (PRN) procedure.  Future Appointments Date Time Provider [place]  06/05/2016 1:00 PM Ria Bush, MD [id number]   Primary Care Physician: Ria Bush, MD Location: [facility] Out[facility] Note by: Beatriz Chancellor A. [name], M.D, DABA, DABAPM, DABPM, DABIPP, FIPP Date: 05/01/2016; Time: 12:00 AM  Pain Score Disclaimer: We use the NRS-11 scale. This is a self-reported, subjective measurement of pain severity with only modest accuracy. It is used primarily to identify changes within a particular patient. It must be understood that outpatient pain scales are significantly less accurate that those used for research, where they can be applied under ideal controlled circumstances with minimal exposure to variables. In reality, the score is likely to be a combination of pain intensity  and pain affect, where pain affect describes the degree of emotional arousal or changes in action readiness caused by the sensory experience of pain. Factors such as social and work situation, setting, emotional state, anxiety levels, expectation, and prior pain experience may influence pain perception and show large inter-individual differences that may also be affected by time variables.  Patient instructions provided during this appointment: Patient Instructions  You were given one prescription for Lyrica today.

## 2016-05-01 NOTE — Patient Instructions (Signed)
You were given one prescription for Lyrica today. 

## 2016-05-01 NOTE — Progress Notes (Signed)
Safety precautions to be maintained throughout the outpatient stay will include: orient to surroundings, keep bed in low position, maintain call bell within reach at all times, provide assistance with transfer out of bed and ambulation.  

## 2016-05-02 ENCOUNTER — Ambulatory Visit: Payer: Medicare Other | Admitting: Family Medicine

## 2016-05-06 DIAGNOSIS — M4310 Spondylolisthesis, site unspecified: Secondary | ICD-10-CM | POA: Diagnosis not present

## 2016-05-08 ENCOUNTER — Other Ambulatory Visit: Payer: Self-pay | Admitting: Family Medicine

## 2016-05-27 ENCOUNTER — Ambulatory Visit: Payer: Medicare Other | Attending: Pain Medicine | Admitting: Pain Medicine

## 2016-05-27 ENCOUNTER — Encounter: Payer: Self-pay | Admitting: Pain Medicine

## 2016-05-27 VITALS — BP 140/67 | HR 83 | Temp 98.0°F | Resp 16 | Ht 60.0 in | Wt 136.0 lb

## 2016-05-27 DIAGNOSIS — Q761 Klippel-Feil syndrome: Secondary | ICD-10-CM

## 2016-05-27 DIAGNOSIS — M9981 Other biomechanical lesions of cervical region: Secondary | ICD-10-CM

## 2016-05-27 DIAGNOSIS — M150 Primary generalized (osteo)arthritis: Secondary | ICD-10-CM | POA: Diagnosis not present

## 2016-05-27 DIAGNOSIS — Z9842 Cataract extraction status, left eye: Secondary | ICD-10-CM | POA: Diagnosis not present

## 2016-05-27 DIAGNOSIS — Z880 Allergy status to penicillin: Secondary | ICD-10-CM | POA: Insufficient documentation

## 2016-05-27 DIAGNOSIS — M4802 Spinal stenosis, cervical region: Secondary | ICD-10-CM | POA: Diagnosis not present

## 2016-05-27 DIAGNOSIS — M47892 Other spondylosis, cervical region: Secondary | ICD-10-CM

## 2016-05-27 DIAGNOSIS — M1991 Primary osteoarthritis, unspecified site: Secondary | ICD-10-CM | POA: Insufficient documentation

## 2016-05-27 DIAGNOSIS — M792 Neuralgia and neuritis, unspecified: Secondary | ICD-10-CM | POA: Diagnosis not present

## 2016-05-27 DIAGNOSIS — Z9841 Cataract extraction status, right eye: Secondary | ICD-10-CM | POA: Insufficient documentation

## 2016-05-27 DIAGNOSIS — Z888 Allergy status to other drugs, medicaments and biological substances status: Secondary | ICD-10-CM | POA: Insufficient documentation

## 2016-05-27 DIAGNOSIS — M47812 Spondylosis without myelopathy or radiculopathy, cervical region: Secondary | ICD-10-CM

## 2016-05-27 DIAGNOSIS — M4803 Spinal stenosis, cervicothoracic region: Secondary | ICD-10-CM | POA: Insufficient documentation

## 2016-05-27 DIAGNOSIS — M797 Fibromyalgia: Secondary | ICD-10-CM | POA: Diagnosis not present

## 2016-05-27 DIAGNOSIS — K2100 Gastro-esophageal reflux disease with esophagitis, without bleeding: Secondary | ICD-10-CM

## 2016-05-27 DIAGNOSIS — Z7982 Long term (current) use of aspirin: Secondary | ICD-10-CM | POA: Insufficient documentation

## 2016-05-27 DIAGNOSIS — Z885 Allergy status to narcotic agent status: Secondary | ICD-10-CM | POA: Diagnosis not present

## 2016-05-27 DIAGNOSIS — M1288 Other specific arthropathies, not elsewhere classified, other specified site: Secondary | ICD-10-CM | POA: Diagnosis not present

## 2016-05-27 DIAGNOSIS — K580 Irritable bowel syndrome with diarrhea: Secondary | ICD-10-CM | POA: Diagnosis not present

## 2016-05-27 DIAGNOSIS — Z9889 Other specified postprocedural states: Secondary | ICD-10-CM | POA: Diagnosis not present

## 2016-05-27 DIAGNOSIS — M5116 Intervertebral disc disorders with radiculopathy, lumbar region: Secondary | ICD-10-CM | POA: Diagnosis not present

## 2016-05-27 DIAGNOSIS — M4722 Other spondylosis with radiculopathy, cervical region: Secondary | ICD-10-CM | POA: Diagnosis not present

## 2016-05-27 DIAGNOSIS — M48061 Spinal stenosis, lumbar region without neurogenic claudication: Secondary | ICD-10-CM

## 2016-05-27 DIAGNOSIS — K219 Gastro-esophageal reflux disease without esophagitis: Secondary | ICD-10-CM

## 2016-05-27 DIAGNOSIS — Z79899 Other long term (current) drug therapy: Secondary | ICD-10-CM | POA: Insufficient documentation

## 2016-05-27 DIAGNOSIS — Z5181 Encounter for therapeutic drug level monitoring: Secondary | ICD-10-CM | POA: Insufficient documentation

## 2016-05-27 DIAGNOSIS — K582 Mixed irritable bowel syndrome: Secondary | ICD-10-CM | POA: Diagnosis not present

## 2016-05-27 DIAGNOSIS — M9983 Other biomechanical lesions of lumbar region: Secondary | ICD-10-CM | POA: Diagnosis not present

## 2016-05-27 DIAGNOSIS — Z90710 Acquired absence of both cervix and uterus: Secondary | ICD-10-CM | POA: Insufficient documentation

## 2016-05-27 DIAGNOSIS — M159 Polyosteoarthritis, unspecified: Secondary | ICD-10-CM | POA: Insufficient documentation

## 2016-05-27 MED ORDER — MELOXICAM 7.5 MG PO TABS: 7.5000 mg | ORAL_TABLET | Freq: Every day | ORAL | 0 refills | Status: DC

## 2016-05-27 MED ORDER — PREGABALIN 150 MG PO CAPS: 150.0000 mg | ORAL_CAPSULE | Freq: Three times a day (TID) | ORAL | 2 refills | Status: DC

## 2016-05-27 MED ORDER — ESOMEPRAZOLE MAGNESIUM 40 MG PO CPDR: 40.0000 mg | DELAYED_RELEASE_CAPSULE | Freq: Two times a day (BID) | ORAL | 0 refills | Status: DC

## 2016-05-27 NOTE — Progress Notes (Signed)
Safety precautions to be maintained throughout the outpatient stay will include: orient to surroundings, keep bed in low position, maintain call bell within reach at all times, provide assistance with transfer out of bed and ambulation.  

## 2016-05-27 NOTE — Patient Instructions (Addendum)
Pain Score  Introduction: The pain score used by this practice is the Verbal Numerical Rating Scale (VNRS-11). This is an 11-point scale. It is for adults and children 10 years or older. There are significant differences in how the pain score is reported, used, and applied. Forget everything you learned in the past and learn this scoring system.  General Information: The scale should reflect your current level of pain. Unless you are specifically asked for the level of your worst pain, or your average pain. If you are asked for one of these two, then it should be understood that it is over the past 24 hours.  Basic Activities of Daily Living (ADL): Personal hygiene, dressing, eating, transferring, and using restroom.  Instructions: Most patients tend to report their level of pain as a combination of two factors, their physical pain and their psychosocial pain. This last one is also known as "suffering" and it is reflection of how physical pain affects you socially and psychologically. From now on, report them separately. From this point on, when asked to report your pain level, report only your physical pain. Use the following table for reference.  Pain Clinic Pain Levels (0-5/10)  Pain Level Score Description  No Pain 0   Mild pain 1 Nagging, annoying, but does not interfere with basic activities of daily living (ADL). Patients are able to eat, bathe, get dressed, toileting (being able to get on and off the toilet and perform personal hygiene functions), transfer (move in and out of bed or a chair without assistance), and maintain continence (able to control bladder and bowel functions). Blood pressure and heart rate are unaffected. A normal heart rate for a healthy adult ranges from 60 to 100 bpm (beats per minute).   Mild to moderate pain 2 Noticeable and distracting. Impossible to hide from other people. More frequent flare-ups. Still possible to adapt and function close to normal. It can be very  annoying and may have occasional stronger flare-ups. With discipline, patients may get used to it and adapt.   Moderate pain 3 Interferes significantly with activities of daily living (ADL). It becomes difficult to feed, bathe, get dressed, get on and off the toilet or to perform personal hygiene functions. Difficult to get in and out of bed or a chair without assistance. Very distracting. With effort, it can be ignored when deeply involved in activities.   Moderately severe pain 4 Impossible to ignore for more than a few minutes. With effort, patients may still be able to manage work or participate in some social activities. Very difficult to concentrate. Signs of autonomic nervous system discharge are evident: dilated pupils (mydriasis); mild sweating (diaphoresis); sleep interference. Heart rate becomes elevated (>115 bpm). Diastolic blood pressure (lower number) rises above 100 mmHg. Patients find relief in laying down and not moving.   Severe pain 5 Intense and extremely unpleasant. Associated with frowning face and frequent crying. Pain overwhelms the senses.  Ability to do any activity or maintain social relationships becomes significantly limited. Conversation becomes difficult. Pacing back and forth is common, as getting into a comfortable position is nearly impossible. Pain wakes you up from deep sleep. Physical signs will be obvious: pupillary dilation; increased sweating; goosebumps; brisk reflexes; cold, clammy hands and feet; nausea, vomiting or dry heaves; loss of appetite; significant sleep disturbance with inability to fall asleep or to remain asleep. When persistent, significant weight loss is observed due to the complete loss of appetite and sleep deprivation.  Blood pressure and heart   rate becomes significantly elevated. Caution: If elevated blood pressure triggers a pounding headache associated with blurred vision, then the patient should immediately seek attention at an urgent or  emergency care unit, as these may be signs of an impending stroke.    Emergency Department Pain Levels (6-10/10)  Emergency Room Pain 6 Severely limiting. Requires emergency care and should not be seen or managed at an outpatient pain management facility. Communication becomes difficult and requires great effort. Assistance to reach the emergency department may be required. Facial flushing and profuse sweating along with potentially dangerous increases in heart rate and blood pressure will be evident.   Distressing pain 7 Self-care is very difficult. Assistance is required to transport, or use restroom. Assistance to reach the emergency department will be required. Tasks requiring coordination, such as bathing and getting dressed become very difficult.   Disabling pain 8 Self-care is no longer possible. At this level, pain is disabling. The individual is unable to do even the most "basic" activities such as walking, eating, bathing, dressing, transferring to a bed, or toileting. Fine motor skills are lost. It is difficult to think clearly.   Incapacitating pain 9 Pain becomes incapacitating. Thought processing is no longer possible. Difficult to remember your own name. Control of movement and coordination are lost.   The worst pain imaginable 10 At this level, most patients pass out from pain. When this level is reached, collapse of the autonomic nervous system occurs, leading to a sudden drop in blood pressure and heart rate. This in turn results in a temporary and dramatic drop in blood flow to the brain, leading to a loss of consciousness. Fainting is one of the body's self defense mechanisms. Passing out puts the brain in a calmed state and causes it to shut down for a while, in order to begin the healing process.    Summary: 1. Refer to this scale when providing [place] with your pain level. 2. Be accurate and careful when reporting your pain level. This will help with your care. 3. Over-reporting  your pain level will lead to loss of credibility. 4. Even a level of 1/10 means that there is pain and will be treated at our facility. 5. High, inaccurate reporting will be documented as "Symptom Exaggeration", leading to loss of credibility and suspicions of possible secondary gains such as obtaining more narcotics, or wanting to appear disabled, for fraudulent reasons. 6. Only pain levels of 5 or below will be seen at our facility. 7. Pain levels of 6 and above will be sent to the Emergency Department and the appointment cancelled.  Prescriptions for Meloxicam and Nexium were sent to your pharmacy. You received a prescription for Lyrica today. Facet Blocks Patient Information  Description: The facets are joints in the spine between the vertebrae.  Like any joints in the body, facets can become irritated and painful.  Arthritis can also effect the facets.  By injecting steroids and local anesthetic in and around these joints, we can temporarily block the nerve supply to them.  Steroids act directly on irritated nerves and tissues to reduce selling and inflammation which often leads to decreased pain.  Facet blocks may be done anywhere along the spine from the neck to the low back depending upon the location of your pain.   After numbing the skin with local anesthetic (like Novocaine), a small needle is passed onto the facet joints under x-ray guidance.  You may experience a sensation of pressure while this is being done.  The entire block usually lasts about 15-25 minutes.   Conditions which may be treated by facet blocks:   Low back/buttock pain  Neck/shoulder pain  Certain types of headaches  Preparation for the injection:  1. Do not eat any solid food or dairy products within 8 hours of your appointment. 2. You may drink clear liquid up to 3 hours before appointment.  Clear liquids include water, black coffee, juice or soda.  No milk or cream please. 3. You may take your regular  medication, including pain medications, with a sip of water before your appointment.  Diabetics should hold regular insulin (if taken separately) and take 1/2 normal NPH dose the morning of the procedure.  Carry some sugar containing items with you to your appointment. 4. A driver must accompany you and be prepared to drive you home after your procedure. 5. Bring all your current medications with you. 6. An IV may be inserted and sedation may be given at the discretion of the physician. 7. A blood pressure cuff, EKG and other monitors will often be applied during the procedure.  Some patients may need to have extra oxygen administered for a short period. 8. You will be asked to provide medical information, including your allergies and medications, prior to the procedure.  We must know immediately if you are taking blood thinners (like Coumadin/Warfarin) or if you are allergic to IV iodine contrast (dye).  We must know if you could possible be pregnant.  Possible side-effects:   Bleeding from needle site  Infection (rare, may require surgery)  Nerve injury (rare)  Numbness & tingling (temporary)  Difficulty urinating (rare, temporary)  Spinal headache (a headache worse with upright posture)  Light-headedness (temporary)  Pain at injection site (serveral days)  Decreased blood pressure (rare, temporary)  Weakness in arm/leg (temporary)  Pressure sensation in back/neck (temporary)   Call if you experience:   Fever/chills associated with headache or increased back/neck pain  Headache worsened by an upright position  New onset, weakness or numbness of an extremity below the injection site  Hives or difficulty breathing (go to the emergency room)  Inflammation or drainage at the injection site(s)  Severe back/neck pain greater than usual  New symptoms which are concerning to you  Please note:  Although the local anesthetic injected can often make your back or neck feel  good for several hours after the injection, the pain will likely return. It takes 3-7 days for steroids to work.  You may not notice any pain relief for at least one week.  If effective, we will often do a series of 2-3 injections spaced 3-6 weeks apart to maximally decrease your pain.  After the initial series, you may be a candidate for a more permanent nerve block of the facets.  If you have any questions, please call #[phone]) [facility] _____________________________________________________________________________________________

## 2016-05-27 NOTE — Progress Notes (Signed)
Patient's Name: Cheyenne Pierce  MRN: [id number]  Referring Provider: Ria Bush, MD  DOB: 10-29-1934  PCP: Ria Bush, MD  DOS: 05/27/2016  Note by: Kathlen Brunswick. [name], MD  Service setting: Ambulatory outpatient  Specialty: Interventional Pain Management  Location: [facility] (AMB) Pain Management Facility    Patient type: Established   Primary Reason(s) for Visit: Encounter for prescription drug management (Level of risk: moderate) CC: Neck Pain and Back Pain (lower, down left leg and left foot)  HPI  Cheyenne Pierce is a 82 y.o. year old, female patient, who comes today for a medication management evaluation. She has Hypothyroidism; GAD (generalized anxiety disorder); Hereditary and idiopathic peripheral neuropathy; Essential hypertension; Reflux esophagitis; GERD; INGUINAL HERNIA, LEFT; Irritable bowel syndrome; Other chronic cystitis; Osteopenia; Systolic murmur; Medicare annual wellness visit, subsequent; PVCs (premature ventricular contractions); Prediabetes; Mood swings ([place]); Memory deficit; Advanced care planning/counseling discussion; HLD (hyperlipidemia); Health maintenance examination; Recurrent falls; Night sweats; Chronic pain syndrome; Encounter for therapeutic drug level monitoring; Encounter for chronic pain management; Chronic midline low back pain with left-sided sciatica; Chronic neck pain (Location of Primary Source of Pain) (Bilateral) (R>L); Cervical spondylosis; Chronic cervical radicular pain (Bilateral) (R>L); Numbness of upper extremity (Bilateral) (R>L); Upper extremity weakness (Bilateral); Neurogenic pain; Neuropathic pain; Inflammatory pain; Chronic Lumbar Radicular pain (Location of Secondary source of pain) (S1) (Left); Allodynia; Encounter for long-term current use of medication; Lumbar spondylosis; Chronic Lumbar Radiculopathy (Left); Chronic lower extremity pain (Bilateral) (L>R); Chronic hip pain (Location of Tertiary source of pain) (Right); Abnormal Cervical MRI  (01/24/15); Cervical spinal stenosis (C3 through C7); Cervical facet hypertrophy (Bilateral); Cervical foraminal stenosis (C7-T1) (Bilateral); Chronic cervical radiculopathy (Bilateral) (R>L) (pain, numbness, and weakness); Abnormal Lumbar MRI (11/10/2014); Lumbar spinal stenosis (L3-4); Lumbar foraminal stenosis (Bilateral L3-4) (Right L4-5); MDD (major depressive disorder), recurrent episode, moderate ([place]); Fibromyalgia; Neck pain on right side; Cervical facet syndrome (Right); Cervical fusion syndrome (C3-C7 ACDF); and Primary osteoarthritis involving multiple joints on her problem list. Her primarily concern today is the Neck Pain and Back Pain (lower, down left leg and left foot)  Pain Assessment: Self-Reported Pain Score: 5 /10 Clinically the patient looks like a 2/10 Reported level is inconsistent with clinical observations. Information on the proper use of the pain scale provided to the patient today Pain Type: Chronic pain Pain Location: Neck Pain Orientation: Right Pain Descriptors / Indicators: Aching, Sharp Pain Frequency: Constant  Ms. Hosie was last seen on 05/01/2016 for medication management. During today's appointment we reviewed [name]'s chronic pain status, as well as her outpatient medication regimen. The patient is currently not taking any nonsteroidal anti-inflammatory drugs, primarily because of her sensitive stomach. Today I will start the patient on Nexium 40 mg by mouth twice a day and I will see her back in one month at which time I will then lower the dose to 20 mg at bedtime. I will also start her on Mobic 7.5 mg by mouth daily and if she tolerates this well then I will increase it to 15 mg by mouth daily. She was able to tolerate the Lyrica 100 mg by mouth 3 times a day with excellent results. The patient indicates that the burning sensation and the sensitivity that she had over her shoulders is now gone. She continues to have pain over the right neck area. I believe  this to be secondary to a cervical facet syndrome. I will bring her back for a diagnostic right-sided cervical facet block under fluoroscopic guidance and IV sedation. If she  tolerates this well, then we will consider radiofrequency. Meanwhile, I will proceed with a trial of Lyrica 150 mg by mouth 3 times a day to see if he can tolerate this. The patient was instructed to give it a try but if by any chance she has any problems with it, she can just take the 150 mg twice a day which should be the same daily dose as taking the 100 mg 3 times a day that she was able to tolerate. Today we reviewed her diagnosis and she indicates the neck to be the primary pain problem followed by the left S1 radiculopathy, which in turn is followed by her hip pain. We'll work first with the neck, follow by the lower extremity and then the hips.  The patient  reports that she does not use drugs. Her body mass index is 26.56 kg/m.  Further details on both, my assessment(s), as well as the proposed treatment plan, please see below.  Controlled Substance Pharmacotherapy Assessment REMS (Risk Evaluation and Mitigation Strategy)  Analgesic: No opioids MME/day: 0 mg/day.  Landis Martins, RN  05/27/2016  1:05 PM  Sign at close encounter Safety precautions to be maintained throughout the outpatient stay will include: orient to surroundings, keep bed in low position, maintain call bell within reach at all times, provide assistance with transfer out of bed and ambulation.    Pharmacokinetics: Liberation and absorption (onset of action): WNL Distribution (time to peak effect): WNL Metabolism and excretion (duration of action): WNL         Pharmacodynamics: Desired effects: Analgesia: Cheyenne Pierce reports >50% benefit. Functional ability: Patient reports that medication allows her to accomplish basic ADLs Clinically meaningful improvement in function (CMIF): Sustained CMIF goals met Perceived effectiveness: Described as  relatively effective, allowing for increase in activities of daily living (ADL) Undesirable effects: Side-effects or Adverse reactions: None reported Monitoring: [place] PMP: Online review of the past 54-monthperiod conducted. Compliant with practice rules and regulations List of all UDS test(s) done:  Lab Results  Component Value Date   TOXASSSELUR FINAL 03/19/2015   Last UDS on record: [employer]ox[employer] Select 13  Date Value Ref Range Status  03/19/2015 FINAL  Final    Comment:    ==================================================================== TOXASSURE SELECT 13 (MW) ==================================================================== Test                             Result       Flag       Units Drug Present not Declared for Prescription Verification   Oxazepam                       612          UNEXPECTED ng/mg creat   Temazepam                      400          UNEXPECTED ng/mg creat    Oxazepam and temazepam are expected metabolites of diazepam.    Oxazepam is also an expected metabolite of other benzodiazepine    drugs, including chlordiazepoxide, prazepam, clorazepate,    halazepam, and temazepam.  Oxazepam and temazepam are available    as scheduled prescription medications. ==================================================================== Test                      Result    Flag   Units  Ref Range   Creatinine              26               mg/dL      >=20 ==================================================================== Declared Medications:  The flagging and interpretation on this report are based on the  following declared medications.  Unexpected results may arise from  inaccuracies in the declared medications.  **Note: The testing scope of this panel does not include following  reported medications:  Bupropion (Wellbutrin)  Gabapentin (Neurontin) ==================================================================== For clinical consultation, please call [phone]. ====================================================================    UDS interpretation: Compliant          Medication Assessment Form: Reviewed. Patient indicates being compliant with therapy Treatment compliance: Compliant Risk Assessment Profile: Aberrant behavior: See prior evaluations. None observed or detected today Comorbid factors increasing risk of overdose: See prior notes. No additional risks detected today Risk of substance use disorder (SUD): Low Opioid Risk Tool (ORT) Total Score:    Interpretation Table:  Score <3 = Low Risk for SUD  Score between 4-7 = Moderate Risk for SUD  Score >8 = High Risk for Opioid Abuse   Risk Mitigation Strategies:  Patient Counseling: Covered Patient-Prescriber Agreement (PPA): Present and active  Notification to other healthcare providers: Done  Pharmacologic Plan: No change in therapy, at this time  Laboratory Chemistry  Inflammation Markers Lab Results  Component Value Date   ESRSEDRATE 11 01/07/2016   CRP 0.7 09/12/2014   Renal Function Lab Results  Component Value Date   BUN 10 01/07/2016   CREATININE 0.64 01/07/2016   GFRAA >60 05/22/2015   GFRNONAA >60 05/22/2015   Hepatic Function Lab Results  Component Value Date   AST 27 02/07/2015   ALT 21 02/07/2015   ALBUMIN 4.0 02/07/2015   Electrolytes Lab Results  Component Value Date   NA 135 01/07/2016   K 3.8 01/07/2016   CL 98 01/07/2016   CALCIUM 9.4 01/07/2016   Pain Modulating Vitamins Lab Results  Component Value Date   VD25OH 47.39 10/04/2013   VITAMINB12 >1500 (H) 09/22/2012   Coagulation Parameters Lab Results  Component Value Date   INR 1.0 11/08/2012   LABPROT 13.8 11/08/2012   APTT 32.6 11/08/2012   PLT 337.0 01/07/2016   Cardiovascular Lab Results  Component Value Date   HGB 13.8 01/07/2016   HCT 40.9 01/07/2016   Note: Lab results reviewed.  Recent Diagnostic Imaging Review  Mr Cervical Spine Wo Contrast Result Date:  04/21/2016 CLINICAL DATA:  Neck pain.  Cervical fusion EXAM: MRI CERVICAL SPINE WITHOUT CONTRAST TECHNIQUE: Multiplanar, multisequence MR imaging of the cervical spine was performed. No intravenous contrast was administered. COMPARISON:  CT cervical spine 09/26/2015, MRI cervical spine 01/24/2015 outside study FINDINGS: Alignment: Normal Vertebrae: ACDF with anterior plate and interbody bone grafts C3 through C7 causing artifact. Negative for fracture or mass lesion. Cord: Normal signal and morphology Posterior Fossa, vertebral arteries, paraspinal tissues: No fluid collection. No mass or adenopathy in the neck. Posterior fossa negative Disc levels: C2-3:  Negative C3-4:  ACDF.  No significant stenosis. C4-5:  ACDF.  No significant stenosis C5-6:  ACDF.  No significant stenosis C6-7:  ACDF without significant stenosis C7-T1: Mild disc and facet degeneration. Facet overgrowth is contributing to moderate foraminal narrowing bilaterally. IMPRESSION: ACDF at C3 through C7 without significant stenosis Bilateral foraminal stenosis C7-T1 due to disc and facet degeneration. Electronically Signed   By: Franchot Gallo M.D.   On: 04/21/2016 14:54  Mr Thoracic Spine Wo Contrast Result Date: 04/21/2016 CLINICAL DATA:  Back pain EXAM: MRI THORACIC SPINE WITHOUT CONTRAST TECHNIQUE: Multiplanar, multisequence MR imaging of the thoracic spine was performed. No intravenous contrast was administered. COMPARISON:  None. FINDINGS: Alignment:  Mild anterior slip T3-4 and T4-5.  Mild kyphosis at T6. Vertebrae: Negative for fracture or mass. Cord:  Normal signal and morphology Paraspinal and other soft tissues: Negative Disc levels: T1-2:  Mild degenerative change without stenosis T2-3:  Mild disc degeneration with disc bulging and spurring. T3-4: Mild anterior slip. Diffuse disc bulging and spurring. No cord deformity T4-5: Mild anterior slip.  Mild degenerative change without stenosis T5-6:  Mild degenerative change T6-7:  Mild  degenerative change T7-8:  Mild disc degeneration T8-9:  Mild disc degeneration T9-10: Disc degeneration with disc bulging and spurring. No significant stenosis T10-11:  Negative T11-12: Negative T12-L1: Negative IMPRESSION: Diffuse thoracic disc degeneration. No focal disc protrusion or spinal stenosis. No cord compression. Electronically Signed   By: Franchot Gallo M.D.   On: 04/21/2016 15:16   Mr Lumbar Spine Wo Contrast Result Date: 04/21/2016 CLINICAL DATA:  Back pain EXAM: MRI LUMBAR SPINE WITHOUT CONTRAST TECHNIQUE: Multiplanar, multisequence MR imaging of the lumbar spine was performed. No intravenous contrast was administered. COMPARISON:  Outside lumbar MRI 11/10/2014.  MRI 10/29/2009 FINDINGS: Segmentation:  Lowest disc space L5-S1. Alignment: Mild retrolisthesis L1-2 and L2-3. 5 mm anterolisthesis L4-5 Vertebrae:  Negative for fracture or mass. Conus medullaris: Extends to the T12-L1 level and appears normal. Paraspinal and other soft tissues: Negative for mass or adenopathy. Multiple small gallstones. Disc levels: L1-2: Moderate to advanced disc degeneration with disc space narrowing and disc bulging. No significant stenosis L2-3: Mild retrolisthesis. Mild disc and facet degeneration without stenosis L3-4: Mild disc bulging and mild facet degeneration. Mild spinal stenosis. Small extraforaminal disc protrusion on the left. No significant foraminal stenosis L4-5: 5 mm anterolisthesis. Moderate facet degeneration. Extraforaminal disc protrusion on the right with impingement of the right L4 nerve root in the foramen which is flattened. Disc protrusion and neural impingement is unchanged from the prior study. Mild spinal stenosis unchanged from the prior study. L5-S1: Negative IMPRESSION: Mild spinal stenosis L3-4. Small extraforaminal disc protrusion on the left without significant neural impingement. 5 mm anterolisthesis L4-5 unchanged from 2016. Right extraforaminal disc protrusion and impingement of  the right L4 nerve root unchanged from the prior MRI. Cholelithiasis Electronically Signed   By: Franchot Gallo M.D.   On: 04/21/2016 15:12   Note: Imaging results reviewed.          Meds  The patient has a current medication list which includes the following prescription(s): acetaminophen, amlodipine, amlodipine, aspirin, astragalus, biotin, calcium-vitamin d, diphenhydramine, esomeprazole, estradiol, hyoscyamine, lactobacillus rhamnosus (gg), levothyroxine, levothyroxine, loperamide, magnesium, meclizine, meloxicam, multiple vitamins-minerals, nitroglycerin, pantoprazole, potassium, pregabalin, and triamterene-hydrochlorothiazide.  Current Outpatient Prescriptions on File Prior to Visit  Medication Sig  . amLODipine (NORVASC) 2.5 MG tablet take 1 tablet by mouth once daily  . aspirin 81 MG tablet Take 81 mg by mouth. Twice a week  . ASTRAGALUS PO Take 500 mg by mouth daily.  . Biotin 1000 MCG tablet Take 1,000 mcg by mouth daily.  . calcium-vitamin D (OSCAL WITH D) 500-200 MG-UNIT tablet Take 1 tablet by mouth.  . diphenhydrAMINE (BENADRYL) 25 mg capsule Take 25 mg by mouth at bedtime.   . hyoscyamine (LEVSIN, ANASPAZ) 0.125 MG tablet Take 1 tablet (0.125 mg total) by mouth 2 (two) times daily as needed.  Marland Kitchen  Lactobacillus Rhamnosus, GG, (CULTURELLE PO) Take 1 capsule by mouth daily.  Marland Kitchen levothyroxine (SYNTHROID, LEVOTHROID) 50 MCG tablet take 1 tablet by mouth once daily TAKE EXTRA TABLET ON MONDAY AND FRIDAY  . loperamide (IMODIUM) 2 MG capsule Take by mouth as needed for diarrhea or loose stools.  . Magnesium 250 MG TABS Take by mouth daily.  . meclizine (ANTIVERT) 25 MG tablet take 1 tablet by mouth three times a day if needed  . Multiple Vitamins-Minerals (CENTRUM SILVER PO) Take by mouth daily.   . nitroGLYCERIN (NITROSTAT) 0.4 MG SL tablet Place 0.4 mg under the tongue every 5 (five) minutes as needed.  . pantoprazole (PROTONIX) 40 MG tablet Take 1 tablet (40 mg total) by mouth daily.   . Potassium 99 MG TABS Take 2 tablets by mouth daily.  Marland Kitchen triamterene-hydrochlorothiazide (MAXZIDE-25) 37.5-25 MG tablet take 1 tablet by mouth once daily   No current facility-administered medications on file prior to visit.    ROS  Constitutional: Denies any fever or chills Gastrointestinal: No reported hemesis, hematochezia, vomiting, or acute GI distress Musculoskeletal: Denies any acute onset joint swelling, redness, loss of ROM, or weakness Neurological: No reported episodes of acute onset apraxia, aphasia, dysarthria, agnosia, amnesia, paralysis, loss of coordination, or loss of consciousness  Allergies  Ms. Drawdy is allergic to codeine; penicillins; prednisone; zoloft [sertraline hcl]; amoxicillin; codeine phosphate; lactose intolerance (gi); metronidazole; perindopril erbumine; potassium-containing compounds; prozac [fluoxetine hcl]; tramadol hcl; and oxcarbazepine.  PFSH  Drug: Ms. Pyeatt  reports that she does not use drugs. Alcohol:  reports that she does not drink alcohol. Tobacco:  reports that she has quit smoking. She has a 30.00 pack-year smoking history. She has never used smokeless tobacco. Medical:  has a past medical history of Allergy; Angina pectoris ([place]); Anxiety; Cataract; Chronic back pain; DDD (degenerative disc disease), cervical; DDD (degenerative disc disease), cervical; DDD (degenerative disc disease), lumbar; Dental bridge present; Depression; Diverticulosis; GERD (gastroesophageal reflux disease); Headache; Heart murmur; History of bronchitis; History of colon polyps; History of gastric ulcer; History of hiatal hernia; Hypertension; Hypothyroidism; IBS (irritable bowel syndrome); Insomnia; Joint pain; Joint swelling; Mass of vagina; Nocturia; Peripheral neuropathy ([place]); Peripheral neuropathy ([place]); Urinary frequency; Urinary urgency; and Vertigo. Family: family history includes Heart disease in her mother; Stroke in her brother.  Past Surgical History:   Procedure Laterality Date  . ABDOMINAL HYSTERECTOMY  1968   left ovary remains  . ANTERIOR CERVICAL DECOMPRESSION/DISCECTOMY FUSION 4 LEVELS N/A 06/05/2015   Procedure: ANTERIOR CERVICAL DECOMPRESSION/DISCECTOMY FUSION CERVICAL THREE-FOUR,CERVICAL FOUR-FIVE,CERVICAL FIVE-SIX,CERVICAL SIX-SEVEN;  Surgeon: Earnie Larsson, MD;  Location: [facility];  Service: Neurosurgery;  Laterality: N/A;  right side approach  . APPENDECTOMY  1948  . bladder suspension with vaginal sling    . CARDIAC CATHETERIZATION  09/26/1998  . CATARACT EXTRACTION W/PHACO Left 10/15/2015   Procedure: CATARACT EXTRACTION PHACO AND INTRAOCULAR LENS PLACEMENT (IOC) LEFT EYE;  Surgeon: Ronnell Freshwater, MD;  Location: [facility];  Service: Ophthalmology;  Laterality: Left;  LEFT LEAVE PT LAST  . CATARACT EXTRACTION W/PHACO Right 11/26/2015   Procedure: CATARACT EXTRACTION PHACO AND INTRAOCULAR LENS PLACEMENT (IOC);  Surgeon: Ronnell Freshwater, MD;  Location: [facility];  Service: Ophthalmology;  Laterality: Right;  RIGHT  . COLONOSCOPY  07/2009   [facility]), normal per pt  . COLONOSCOPY  06/2014   int hem, rpt 5 yrs ([name])  . COLOSTOMY W/ RECTOCELE REPAIR  06/2004  . DEXA  2013   T -1.0  . epidural infections    .  epidural steroid injections  multiple latest 05/2014, 07/2014   help periph neuropathy [name])  . ESOPHAGOGASTRODUODENOSCOPY    . EYE SURGERY Left 10/15/2015   cataract removal  . HERNIA REPAIR    . NM MYOVIEW LTD  2009   WNL per report  . OVARIAN CYST SURGERY  1963   cyst on ovaries  . TONSILLECTOMY    . US ECHOCARDIOGRAPHY  2009   mild aortic/mitral insuff, ER 91%, mild diastolic dysfunction  . VENTRAL HERNIA REPAIR  08/2008   abdominal wall, lysis of adhesions   Constitutional Exam  General appearance: Well nourished, well developed, and well hydrated. In no apparent acute distress Vitals:   05/27/16 1256  BP: 140/67  Pulse: 83  Resp: 16  Temp: 98 F (36.7 C)   TempSrc: Oral  SpO2: 99%  Weight: 136 lb (61.7 kg)  Height: 5' (1.524 m)   BMI Assessment: Estimated body mass index is 26.56 kg/m as calculated from the following:   Height as of this encounter: 5' (1.524 m).   Weight as of this encounter: 136 lb (61.7 kg).  BMI interpretation table: BMI level Category Range association with higher incidence of chronic pain  <18 kg/m2 Underweight   18.5-24.9 kg/m2 Ideal body weight   25-29.9 kg/m2 Overweight Increased incidence by 20%  30-34.9 kg/m2 Obese (Class I) Increased incidence by 68%  35-39.9 kg/m2 Severe obesity (Class II) Increased incidence by 136%  >40 kg/m2 Extreme obesity (Class III) Increased incidence by 254%   BMI Readings from Last 4 Encounters:  05/27/16 26.56 kg/m  05/01/16 26.37 kg/m  03/05/16 25.39 kg/m  01/29/16 25.39 kg/m   Wt Readings from Last 4 Encounters:  05/27/16 136 lb (61.7 kg)  05/01/16 135 lb (61.2 kg)  03/05/16 130 lb (59 kg)  01/29/16 130 lb (59 kg)  Psych/Mental status: Alert, oriented x 3 (person, place, & time)       Eyes: PERLA Respiratory: No evidence of acute respiratory distress  Cervical Spine Exam  Inspection: No masses, redness, or swelling Alignment: Symmetrical Functional ROM: Unrestricted ROM Stability: No instability detected Muscle strength & Tone: Functionally intact Sensory: Unimpaired Palpation: Non-contributory  Upper Extremity (UE) Exam    Side: Right upper extremity  Side: Left upper extremity  Inspection: No masses, redness, swelling, or asymmetry. No contractures  Inspection: No masses, redness, swelling, or asymmetry. No contractures  Functional ROM: Unrestricted ROM          Functional ROM: Unrestricted ROM          Muscle strength & Tone: Functionally intact  Muscle strength & Tone: Functionally intact  Sensory: Unimpaired  Sensory: Unimpaired  Palpation: Euthermic  Palpation: Euthermic  Specialized Test(s): Deferred         Specialized Test(s): Deferred           Thoracic Spine Exam  Inspection: No masses, redness, or swelling Alignment: Symmetrical Functional ROM: Unrestricted ROM Stability: No instability detected Sensory: Unimpaired Muscle strength & Tone: Functionally intact Palpation: Non-contributory  Lumbar Spine Exam  Inspection: No masses, redness, or swelling Alignment: Symmetrical Functional ROM: Unrestricted ROM Stability: No instability detected Muscle strength & Tone: Functionally intact Sensory: Unimpaired Palpation: Non-contributory Provocative Tests: Lumbar Hyperextension and rotation test: evaluation deferred today       Patrick's Maneuver: evaluation deferred today              Gait & Posture Assessment  Ambulation: Patient ambulates using a walker Gait: Limited. Using assistive device to ambulate Posture: WNL   Lower  Extremity Exam    Side: Right lower extremity  Side: Left lower extremity  Inspection: No masses, redness, swelling, or asymmetry. No contractures  Inspection: No masses, redness, swelling, or asymmetry. No contractures  Functional ROM: Unrestricted ROM          Functional ROM: Unrestricted ROM          Muscle strength & Tone: Functionally intact  Muscle strength & Tone: Functionally intact  Sensory: Unimpaired  Sensory: Unimpaired  Palpation: No palpable anomalies  Palpation: No palpable anomalies   Assessment  Primary Diagnosis & Pertinent Problem List: The primary encounter diagnosis was Cervical facet syndrome. Diagnoses of Cervical facet hypertrophy, Osteoarthritis of spine with radiculopathy, cervical region, Gastroesophageal reflux disease without esophagitis, Irritable bowel syndrome with both constipation and diarrhea, Neurogenic pain, Fibromyalgia, Cervical fusion syndrome (C3-C7 ACDF), Cervical foraminal stenosis (C7-T1) (B), Reflux esophagitis, Primary osteoarthritis involving multiple joints, and Lumbar foraminal stenosis (Bilateral L3-4) (Right L4-5) were also pertinent to this  visit.  Status Diagnosis  Controlled Controlled Controlled 1. Cervical facet syndrome   2. Cervical facet hypertrophy   3. Osteoarthritis of spine with radiculopathy, cervical region   4. Gastroesophageal reflux disease without esophagitis   5. Irritable bowel syndrome with both constipation and diarrhea   6. Neurogenic pain   7. Fibromyalgia   8. Cervical fusion syndrome (C3-C7 ACDF)   9. Cervical foraminal stenosis (C7-T1) (B)   10. Reflux esophagitis   11. Primary osteoarthritis involving multiple joints   12. Lumbar foraminal stenosis (Bilateral L3-4) (Right L4-5)      Plan of Care  Pharmacotherapy (Medications Ordered): Meds ordered this encounter  Medications  . esomeprazole (NEXIUM) 40 MG capsule    Sig: Take 1 capsule (40 mg total) by mouth 2 (two) times daily before a meal. Take while on ibuprofen.    Dispense:  28 capsule    Refill:  0  . pregabalin (LYRICA) 150 MG capsule    Sig: Take 1 capsule (150 mg total) by mouth 3 (three) times daily.    Dispense:  90 capsule    Refill:  2    Do not place this medication, or any other prescription from our practice, on "Automatic Refill". Patient may have prescription filled one day early if pharmacy is closed on scheduled refill date.  . meloxicam (MOBIC) 7.5 MG tablet    Sig: Take 1 tablet (7.5 mg total) by mouth daily.    Dispense:  30 tablet    Refill:  0    Do not place medication on "Automatic Refill". Fill one day early if pharmacy is closed on scheduled refill date.   New Prescriptions   ESOMEPRAZOLE (NEXIUM) 40 MG CAPSULE    Take 1 capsule (40 mg total) by mouth 2 (two) times daily before a meal. Take while on ibuprofen.   MELOXICAM (MOBIC) 7.5 MG TABLET    Take 1 tablet (7.5 mg total) by mouth daily.   PREGABALIN (LYRICA) 150 MG CAPSULE    Take 1 capsule (150 mg total) by mouth 3 (three) times daily.   Medications administered today: Ms. Oakey had no medications administered during this visit. Lab-work,  procedure(s), and/or referral(s): Orders Placed This Encounter  Procedures  . CERVICAL FACET (MEDIAL BRANCH NERVE BLOCK)    Imaging and/or referral(s): None  Interventional therapies: Planned, scheduled, and/or pending:   Diagnostic right-sided cervical facet block under fluoroscopic guidance and IV sedation.   Considering:   Right cervical facet block Palliative right cervical epidural steroid injections Palliative left L4-5  lumbar epidural steroid injection   Palliative PRN treatment(s):   Palliative right cervical epidural steroid injections Palliative left L4-5 lumbar epidural steroid injection   Provider-requested follow-up: Return in about 1 month (around 06/24/2016) for in addition, procedure (ASAP).  Future Appointments Date Time Provider [place]  06/02/2016 10:15 AM Milinda Pointer, MD [facility] None  06/05/2016 1:00 PM Ria Bush, MD [facility]  08/21/2016 1:15 PM Milinda Pointer, MD [facility] None   Primary Care Physician: Ria Bush, MD Location: [facility] Note by: Kathlen Brunswick. [name], M.D, DABA, DABAPM, DABPM, DABIPP, FIPP Date: 05/27/2016; Time: 3:00 PM  Pain Score Disclaimer: We use the NRS-11 scale. This is a self-reported, subjective measurement of pain severity with only modest accuracy. It is used primarily to identify changes within a particular patient. It must be understood that outpatient pain scales are significantly less accurate that those used for research, where they can be applied under ideal controlled circumstances with minimal exposure to variables. In reality, the score is likely to be a combination of pain intensity and pain affect, where pain affect describes the degree of emotional arousal or changes in action readiness caused by the sensory experience of pain. Factors such as social and work situation, setting, emotional state, anxiety levels, expectation, and prior pain experience  may influence pain perception and show large inter-individual differences that may also be affected by time variables.  Patient instructions provided during this appointment: Patient Instructions   Pain Score  Introduction: The pain score used by this practice is the Verbal Numerical Rating Scale (VNRS-11). This is an 11-point scale. It is for adults and children 10 years or older. There are significant differences in how the pain score is reported, used, and applied. Forget everything you learned in the past and learn this scoring system.  General Information: The scale should reflect your current level of pain. Unless you are specifically asked for the level of your worst pain, or your average pain. If you are asked for one of these two, then it should be understood that it is over the past 24 hours.  Basic Activities of Daily Living (ADL): Personal hygiene, dressing, eating, transferring, and using restroom.  Instructions: Most patients tend to report their level of pain as a combination of two factors, their physical pain and their psychosocial pain. This last one is also known as "suffering" and it is reflection of how physical pain affects you socially and psychologically. From now on, report them separately. From this point on, when asked to report your pain level, report only your physical pain. Use the following table for reference.  Pain Clinic Pain Levels (0-5/10)  Pain Level Score Description  No Pain 0   Mild pain 1 Nagging, annoying, but does not interfere with basic activities of daily living (ADL). Patients are able to eat, bathe, get dressed, toileting (being able to get on and off the toilet and perform personal hygiene functions), transfer (move in and out of bed or a chair without assistance), and maintain continence (able to control bladder and bowel functions). Blood pressure and heart rate are unaffected. A normal heart rate for a healthy adult ranges from 60 to 100 bpm  (beats per minute).   Mild to moderate pain 2 Noticeable and distracting. Impossible to hide from other people. More frequent flare-ups. Still possible to adapt and function close to normal. It can be very annoying and may have occasional stronger flare-ups. With discipline, patients may get used to it and  adapt.   Moderate pain 3 Interferes significantly with activities of daily living (ADL). It becomes difficult to feed, bathe, get dressed, get on and off the toilet or to perform personal hygiene functions. Difficult to get in and out of bed or a chair without assistance. Very distracting. With effort, it can be ignored when deeply involved in activities.   Moderately severe pain 4 Impossible to ignore for more than a few minutes. With effort, patients may still be able to manage work or participate in some social activities. Very difficult to concentrate. Signs of autonomic nervous system discharge are evident: dilated pupils (mydriasis); mild sweating (diaphoresis); sleep interference. Heart rate becomes elevated (>115 bpm). Diastolic blood pressure (lower number) rises above 100 mmHg. Patients find relief in laying down and not moving.   Severe pain 5 Intense and extremely unpleasant. Associated with frowning face and frequent crying. Pain overwhelms the senses.  Ability to do any activity or maintain social relationships becomes significantly limited. Conversation becomes difficult. Pacing back and forth is common, as getting into a comfortable position is nearly impossible. Pain wakes you up from deep sleep. Physical signs will be obvious: pupillary dilation; increased sweating; goosebumps; brisk reflexes; cold, clammy hands and feet; nausea, vomiting or dry heaves; loss of appetite; significant sleep disturbance with inability to fall asleep or to remain asleep. When persistent, significant weight loss is observed due to the complete loss of appetite and sleep deprivation.  Blood pressure and heart  rate becomes significantly elevated. Caution: If elevated blood pressure triggers a pounding headache associated with blurred vision, then the patient should immediately seek attention at an urgent or emergency care unit, as these may be signs of an impending stroke.    Emergency Department Pain Levels (6-10/10)  Emergency Room Pain 6 Severely limiting. Requires emergency care and should not be seen or managed at an outpatient pain management facility. Communication becomes difficult and requires great effort. Assistance to reach the emergency department may be required. Facial flushing and profuse sweating along with potentially dangerous increases in heart rate and blood pressure will be evident.   Distressing pain 7 Self-care is very difficult. Assistance is required to transport, or use restroom. Assistance to reach the emergency department will be required. Tasks requiring coordination, such as bathing and getting dressed become very difficult.   Disabling pain 8 Self-care is no longer possible. At this level, pain is disabling. The individual is unable to do even the most "basic" activities such as walking, eating, bathing, dressing, transferring to a bed, or toileting. Fine motor skills are lost. It is difficult to think clearly.   Incapacitating pain 9 Pain becomes incapacitating. Thought processing is no longer possible. Difficult to remember your own name. Control of movement and coordination are lost.   The worst pain imaginable 10 At this level, most patients pass out from pain. When this level is reached, collapse of the autonomic nervous system occurs, leading to a sudden drop in blood pressure and heart rate. This in turn results in a temporary and dramatic drop in blood flow to the brain, leading to a loss of consciousness. Fainting is one of the body's self defense mechanisms. Passing out puts the brain in a calmed state and causes it to shut down for a while, in order to begin the  healing process.    Summary: 1. Refer to this scale when providing [place] with your pain level. 2. Be accurate and careful when reporting your pain level. This will help with  your care. 3. Over-reporting your pain level will lead to loss of credibility. 4. Even a level of 1/10 means that there is pain and will be treated at our facility. 5. High, inaccurate reporting will be documented as "Symptom Exaggeration", leading to loss of credibility and suspicions of possible secondary gains such as obtaining more narcotics, or wanting to appear disabled, for fraudulent reasons. 6. Only pain levels of 5 or below will be seen at our facility. 7. Pain levels of 6 and above will be sent to the Emergency Department and the appointment cancelled.  Prescriptions for Meloxicam and Nexium were sent to your pharmacy. You received a prescription for Lyrica today. Facet Blocks Patient Information  Description: The facets are joints in the spine between the vertebrae.  Like any joints in the body, facets can become irritated and painful.  Arthritis can also effect the facets.  By injecting steroids and local anesthetic in and around these joints, we can temporarily block the nerve supply to them.  Steroids act directly on irritated nerves and tissues to reduce selling and inflammation which often leads to decreased pain.  Facet blocks may be done anywhere along the spine from the neck to the low back depending upon the location of your pain.   After numbing the skin with local anesthetic (like Novocaine), a small needle is passed onto the facet joints under x-ray guidance.  You may experience a sensation of pressure while this is being done.  The entire block usually lasts about 15-25 minutes.   Conditions which may be treated by facet blocks:   Low back/buttock pain  Neck/shoulder pain  Certain types of headaches  Preparation for the injection:  1. Do not eat any solid food or dairy products within 8 hours  of your appointment. 2. You may drink clear liquid up to 3 hours before appointment.  Clear liquids include water, black coffee, juice or soda.  No milk or cream please. 3. You may take your regular medication, including pain medications, with a sip of water before your appointment.  Diabetics should hold regular insulin (if taken separately) and take 1/2 normal NPH dose the morning of the procedure.  Carry some sugar containing items with you to your appointment. 4. A driver must accompany you and be prepared to drive you home after your procedure. 5. Bring all your current medications with you. 6. An IV may be inserted and sedation may be given at the discretion of the physician. 7. A blood pressure cuff, EKG and other monitors will often be applied during the procedure.  Some patients may need to have extra oxygen administered for a short period. 8. You will be asked to provide medical information, including your allergies and medications, prior to the procedure.  We must know immediately if you are taking blood thinners (like Coumadin/Warfarin) or if you are allergic to IV iodine contrast (dye).  We must know if you could possible be pregnant.  Possible side-effects:   Bleeding from needle site  Infection (rare, may require surgery)  Nerve injury (rare)  Numbness & tingling (temporary)  Difficulty urinating (rare, temporary)  Spinal headache (a headache worse with upright posture)  Light-headedness (temporary)  Pain at injection site (serveral days)  Decreased blood pressure (rare, temporary)  Weakness in arm/leg (temporary)  Pressure sensation in back/neck (temporary)   Call if you experience:   Fever/chills associated with headache or increased back/neck pain  Headache worsened by an upright position  New onset, weakness or numbness  of an extremity below the injection site  Hives or difficulty breathing (go to the emergency room)  Inflammation or drainage at the  injection site(s)  Severe back/neck pain greater than usual  New symptoms which are concerning to you  Please note:  Although the local anesthetic injected can often make your back or neck feel good for several hours after the injection, the pain will likely return. It takes 3-7 days for steroids to work.  You may not notice any pain relief for at least one week.  If effective, we will often do a series of 2-3 injections spaced 3-6 weeks apart to maximally decrease your pain.  After the initial series, you may be a candidate for a more permanent nerve block of the facets.  If you have any questions, please call #[phone]) [facility] _____________________________________________________________________________________________

## 2016-06-02 ENCOUNTER — Ambulatory Visit (HOSPITAL_BASED_OUTPATIENT_CLINIC_OR_DEPARTMENT_OTHER): Payer: Medicare Other | Admitting: Pain Medicine

## 2016-06-02 ENCOUNTER — Ambulatory Visit
Admission: RE | Admit: 2016-06-02 | Discharge: 2016-06-02 | Disposition: A | Discharge status: Home / Self Care | Payer: Medicare Other | Source: Ambulatory Visit | Attending: Pain Medicine | Admitting: Pain Medicine

## 2016-06-02 ENCOUNTER — Encounter: Payer: Self-pay | Admitting: Pain Medicine

## 2016-06-02 VITALS — BP 130/63 | HR 67 | Temp 96.4°F | Resp 16 | Ht 60.0 in | Wt 135.0 lb

## 2016-06-02 DIAGNOSIS — M47812 Spondylosis without myelopathy or radiculopathy, cervical region: Secondary | ICD-10-CM

## 2016-06-02 DIAGNOSIS — Z79899 Other long term (current) drug therapy: Secondary | ICD-10-CM | POA: Diagnosis not present

## 2016-06-02 DIAGNOSIS — Z9889 Other specified postprocedural states: Secondary | ICD-10-CM | POA: Insufficient documentation

## 2016-06-02 DIAGNOSIS — Z880 Allergy status to penicillin: Secondary | ICD-10-CM | POA: Insufficient documentation

## 2016-06-02 DIAGNOSIS — Z885 Allergy status to narcotic agent status: Secondary | ICD-10-CM | POA: Diagnosis not present

## 2016-06-02 DIAGNOSIS — M47892 Other spondylosis, cervical region: Secondary | ICD-10-CM

## 2016-06-02 DIAGNOSIS — Z90710 Acquired absence of both cervix and uterus: Secondary | ICD-10-CM | POA: Diagnosis not present

## 2016-06-02 DIAGNOSIS — Z888 Allergy status to other drugs, medicaments and biological substances status: Secondary | ICD-10-CM | POA: Diagnosis not present

## 2016-06-02 DIAGNOSIS — M1288 Other specific arthropathies, not elsewhere classified, other specified site: Secondary | ICD-10-CM | POA: Insufficient documentation

## 2016-06-02 MED ORDER — LIDOCAINE HCL (PF) 1 % IJ SOLN: 10.0000 mL | Freq: Once | INTRAMUSCULAR | Status: DC

## 2016-06-02 MED ORDER — LACTATED RINGERS IV SOLN
1000.0000 mL | Freq: Once | INTRAVENOUS | Status: AC
  Administered 2016-06-02: 1000 mL via INTRAVENOUS

## 2016-06-02 MED ORDER — DEXAMETHASONE SODIUM PHOSPHATE 4 MG/ML IJ SOLN
10.0000 mg | Freq: Once | INTRAMUSCULAR | Status: AC
Start: 2016-06-02 — End: 2016-06-02
  Administered 2016-06-02: 10 mg
  Filled 2016-06-02: qty 3

## 2016-06-02 MED ORDER — ROPIVACAINE HCL 5 MG/ML IJ SOLN
INTRAMUSCULAR | Status: AC
  Filled 2016-06-02: qty 20

## 2016-06-02 MED ORDER — FENTANYL CITRATE (PF) 100 MCG/2ML IJ SOLN
25.0000 ug | INTRAMUSCULAR | Status: DC | PRN
  Administered 2016-06-02: 100 ug via INTRAVENOUS
  Filled 2016-06-02: qty 2

## 2016-06-02 MED ORDER — MIDAZOLAM HCL 5 MG/5ML IJ SOLN
1.0000 mg | INTRAMUSCULAR | Status: DC | PRN
  Administered 2016-06-02: 2 mg via INTRAVENOUS
  Filled 2016-06-02: qty 5

## 2016-06-02 MED ORDER — ROPIVACAINE HCL 5 MG/ML IJ SOLN
5.0000 mL | Freq: Once | INTRAMUSCULAR | Status: AC
  Administered 2016-06-02: 25 mL via EPIDURAL

## 2016-06-02 NOTE — Progress Notes (Signed)
Safety precautions to be maintained throughout the outpatient stay will include: orient to surroundings, keep bed in low position, maintain call bell within reach at all times, provide assistance with transfer out of bed and ambulation.  

## 2016-06-02 NOTE — Progress Notes (Signed)
1208 O2 sat at 73% on room air. Oxygen applied at 4L/min per [place].

## 2016-06-02 NOTE — Progress Notes (Signed)
Patient's Name: Cheyenne Pierce  MRN: [id number]  Referring Provider: Milinda Pointer, MD  DOB: [birth year]  PCP: Ria Bush, MD  DOS: 06/02/2016  Note by: Kathlen Brunswick. [name], MD  Service setting: Ambulatory outpatient  Location: [facility] (AMB) Pain Management Facility  Visit type: Procedure  Specialty: Interventional Pain Management  Patient type: Established   Primary Reason for Visit: Interventional Pain Management Treatment. CC: Neck Pain (right)  Procedure:  Anesthesia, Analgesia, Anxiolysis:  Type: Diagnostic Cervical Facet Medial Branch Block(s) Region: Posterolateral cervical spine region Level: C3, C4, C5, C6, & C7 Medial Branch Level(s) Laterality: Right-Sided Paraspinal  Type: Local Anesthesia with Moderate (Conscious) Sedation Local Anesthetic: Lidocaine 1% Route: Intravenous (IV) IV Access: Secured Sedation: Meaningful verbal contact was maintained at all times during the procedure  Indication(s): Analgesia and Anxiety  Indications: 1. Cervical facet syndrome   2. Cervical facet hypertrophy    Pain Score: Pre-procedure: 4 /10 Post-procedure: 0-No pain/10  Pre-op Assessment:  Previous date of service: 05/27/16 Service provided: Med Refill Cheyenne Pierce is a 82 y.o. (year old), female patient, seen today for interventional treatment. She  has a past surgical history that includes Appendectomy (1948); Abdominal hysterectomy (1968); Ovarian cyst surgery (1963); Hernia repair; Colostomy w/ rectocele repair (06/2004); Ventral hernia repair (08/2008); Colonoscopy (07/2009); epidural steroid injections (multiple latest 05/2014, 07/2014); US ECHOCARDIOGRAPHY (2009); NM MYOVIEW LTD (2009); DEXA (2013); Colonoscopy (06/2014); Tonsillectomy; Cardiac catheterization (09/26/1998); bladder suspension with vaginal sling; epidural infections; Esophagogastroduodenoscopy; Anterior cervical decompression/discectomy fusion 4 level (N/A, 06/05/2015); Cataract extraction w/PHACO (Left, 10/15/2015); Eye  surgery (Left, 10/15/2015); and Cataract extraction w/PHACO (Right, 11/26/2015). Her primarily concern today is the Neck Pain (right)  Initial Vital Signs: There were no vitals taken for this visit. BMI: 26.37 kg/m  Risk Assessment: Allergies: Reviewed. She is allergic to codeine; penicillins; prednisone; zoloft [sertraline hcl]; amoxicillin; codeine phosphate; lactose intolerance (gi); metronidazole; perindopril erbumine; potassium-containing compounds; prozac [fluoxetine hcl]; tramadol hcl; and oxcarbazepine.  Allergy Precautions: None required Coagulopathies: "Reviewed. None identified.  Blood-thinner therapy: None at this time Active Infection(s): Reviewed. None identified. Cheyenne Pierce is afebrile  Site Confirmation: Cheyenne Pierce was asked to confirm the procedure and laterality before marking the site Procedure checklist: Completed Consent: Before the procedure and under the influence of no sedative(s), amnesic(s), or anxiolytics, the patient was informed of the treatment options, risks and possible complications. To fulfill our ethical and legal obligations, as recommended by the American Medical Association's Code of Ethics, I have informed the patient of my clinical impression; the nature and purpose of the treatment or procedure; the risks, benefits, and possible complications of the intervention; the alternatives, including doing nothing; the risk(s) and benefit(s) of the alternative treatment(s) or procedure(s); and the risk(s) and benefit(s) of doing nothing. The patient was provided information about the general risks and possible complications associated with the procedure. These may include, but are not limited to: failure to achieve desired goals, infection, bleeding, organ or nerve damage, allergic reactions, paralysis, and death. In addition, the patient was informed of those risks and complications associated to Spine-related procedures, such as failure to decrease pain; infection  (i.e.: Meningitis, epidural or intraspinal abscess); bleeding (i.e.: epidural hematoma, subarachnoid hemorrhage, or any other type of intraspinal or peri-dural bleeding); organ or nerve damage (i.e.: Any type of peripheral nerve, nerve root, or spinal cord injury) with subsequent damage to sensory, motor, and/or autonomic systems, resulting in permanent pain, numbness, and/or weakness of one or several areas of the body; allergic reactions; (i.e.: anaphylactic reaction); and/or death.  Furthermore, the patient was informed of those risks and complications associated with the medications. These include, but are not limited to: allergic reactions (i.e.: anaphylactic or anaphylactoid reaction(s)); adrenal axis suppression; blood sugar elevation that in diabetics may result in ketoacidosis or comma; water retention that in patients with history of congestive heart failure may result in shortness of breath, pulmonary edema, and decompensation with resultant heart failure; weight gain; swelling or edema; medication-induced neural toxicity; particulate matter embolism and blood vessel occlusion with resultant organ, and/or nervous system infarction; and/or aseptic necrosis of one or more joints. Finally, the patient was informed that Medicine is not an exact science; therefore, there is also the possibility of unforeseen or unpredictable risks and/or possible complications that may result in a catastrophic outcome. The patient indicated having understood very clearly. We have given the patient no guarantees and we have made no promises. Enough time was given to the patient to ask questions, all of which were answered to the patient's satisfaction. Cheyenne Pierce has indicated that she wanted to continue with the procedure. Attestation: I, the ordering provider, attest that I have discussed with the patient the benefits, risks, side-effects, alternatives, likelihood of achieving goals, and potential problems during recovery  for the procedure that I have provided informed consent. Date: 06/02/2016; Time: 9:46 AM  Pre-Procedure Preparation:  Monitoring: As per clinic protocol. Respiration, ETCO2, SpO2, BP, heart rate and rhythm monitor placed and checked for adequate function Safety Precautions: Patient was assessed for positional comfort and pressure points before starting the procedure. Time-out: I initiated and conducted the "Time-out" before starting the procedure, as per protocol. The patient was asked to participate by confirming the accuracy of the "Time Out" information. Verification of the correct person, site, and procedure were performed and confirmed by me, the nursing staff, and the patient. "Time-out" conducted as per Joint Commission's Universal Protocol (UP.01.01.01). "Time-out" Date & Time: 06/02/2016; 1159 hrs.  Description of Procedure Process:   Position: Prone with head of the table was raised to facilitate breathing. Target Area: For Cervical Facet blocks, the target is the postero-lateral waist of the articular pillars at the C3, C4, C5, C6, & C7 levels. Approach: Posterior approach. Area Prepped: Entire Posterior Cervico-thoracic Region Prepping solution: ChloraPrep (2% chlorhexidine gluconate and 70% isopropyl alcohol) Safety Precautions: Aspiration looking for blood return was conducted prior to all injections. At no point did we inject any substances, as a needle was being advanced. No attempts were made at seeking any paresthesias. Safe injection practices and needle disposal techniques used. Medications properly checked for expiration dates. SDV (single dose vial) medications used. Description of the Procedure: Protocol guidelines were followed. The patient was placed in position over the fluoroscopy table. The target area was identified and the area prepped in the usual manner. Skin desensitized using vapocoolant spray. Skin & deeper tissues infiltrated with local anesthetic. Appropriate amount  of time allowed to pass for local anesthetics to take effect. The procedure needle was introduced through the skin, ipsilateral to the reported pain, and advanced to the target area. Bone was contacted on the posterior aspect of the articular pillars and the needle walked lateral, until the border was cleared. Lateral views taken to make sure the needle tip did not advance past the posterior third of the lateral mass of the posterior columns. The procedure was repeated in identical fashion for each level. Negative aspiration confirmed. Solution injected in intermittent fashion, asking for systemic symptoms every 0.5cc of injectate. The needles were then removed and  the area cleansed, making sure to leave some of the prepping solution back to take advantage of its long term bactericidal properties. Vitals:   06/02/16 1212 06/02/16 1222 06/02/16 1232 06/02/16 1242  BP: 132/74 129/60 (!) 119/59 130/63  Pulse: 70 73 64 67  Resp: 12 12 16 16   Temp: (!) 96.4 F (35.8 C)     SpO2: 94% 98% 94% 95%  Weight:      Height:        Start Time: 1200 hrs. End Time: 1208 hrs. Materials:  Needle(s) Type: Regular needle Gauge: 25G Length: 3.5-in Medication(s): We administered fentaNYL, lactated ringers, midazolam, dexamethasone, and ropivacaine (PF) 5 mg/mL (0.5%). Please see chart orders for dosing details.  Imaging Guidance (Spinal):  Type of Imaging Technique: Fluoroscopy Guidance (Spinal) Indication(s): Assistance in needle guidance and placement for procedures requiring needle placement in or near specific anatomical locations not easily accessible without such assistance. Exposure Time: Please see nurses notes. Contrast: None used. Fluoroscopic Guidance: I was personally present during the use of fluoroscopy. "Tunnel Vision Technique" used to obtain the best possible view of the target area. Parallax error corrected before commencing the procedure. "Direction-depth-direction" technique used to introduce  the needle under continuous pulsed fluoroscopy. Once target was reached, antero-posterior, oblique, and lateral fluoroscopic projection used confirm needle placement in all planes. Images permanently stored in EMR. Interpretation: No contrast injected. I personally interpreted the imaging intraoperatively. Adequate needle placement confirmed in multiple planes. Permanent images saved into the patient's record.  Antibiotic Prophylaxis:  Indication(s): None identified Antibiotic given: None  Post-operative Assessment:  EBL: None Complications: No immediate post-treatment complications observed by team, or reported by patient. Note: The patient tolerated the entire procedure well. A repeat set of vitals were taken after the procedure and the patient was kept under observation following institutional policy, for this type of procedure. Post-procedural neurological assessment was performed, showing return to baseline, prior to discharge. The patient was provided with post-procedure discharge instructions, including a section on how to identify potential problems. Should any problems arise concerning this procedure, the patient was given instructions to immediately contact us, at any time, without hesitation. In any case, we plan to contact the patient by telephone for a follow-up status report regarding this interventional procedure. Comments:  No additional relevant information.  Plan of Care  Disposition: Discharge home  Discharge Date & Time: 06/02/2016; 1242 hrs.  Physician-requested Follow-up:  Return in about 2 weeks (around 06/16/2016) for Post-Procedure evaluation.  Future Appointments Date Time Provider [place]  06/05/2016 1:00 PM Ria Bush, MD [facility]  07/01/2016 2:00 PM Milinda Pointer, MD [facility] None  08/21/2016 1:15 PM Milinda Pointer, MD [facility] None   Medications ordered for procedure: Meds ordered this encounter  Medications  . fentaNYL  (SUBLIMAZE) injection 25-50 mcg    Make sure Narcan is available in the pyxis when using this medication. In the event of respiratory depression (RR< 8/min): Titrate NARCAN (naloxone) in increments of 0.1 to 0.2 mg IV at 2-3 minute intervals, until desired degree of reversal.  . lactated ringers infusion 1,000 mL  . midazolam (VERSED) 5 MG/5ML injection 1-2 mg    Make sure Flumazenil is available in the pyxis when using this medication. If oversedation occurs, administer 0.2 mg IV over 15 sec. If after 45 sec no response, administer 0.2 mg again over 1 min; may repeat at 1 min intervals; not to exceed 4 doses (1 mg)  . dexamethasone (DECADRON) injection 10 mg  . lidocaine (PF) (  XYLOCAINE) 1 % injection 10 mL  . ropivacaine (PF) 5 mg/mL (0.5%) (NAROPIN) injection 5 mL   Medications administered: We administered fentaNYL, lactated ringers, midazolam, dexamethasone, and ropivacaine (PF) 5 mg/mL (0.5%).  See the medical record for exact dosing, route, and time of administration.  Lab-work, Procedure(s), & Referral(s) Ordered: Orders Placed This Encounter  Procedures  . DG C-Arm 1-60 Min-No Report  . Discharge instructions  . Follow-up  . Informed Consent Details: Transcribe to consent form and obtain patient signature  . Provider attestation of informed consent for procedure/surgical case  . Verify informed consent   Imaging Ordered: Results for orders placed in visit on 01/29/16  DG C-Arm 1-60 Min-No Report   Narrative CLINICAL DATA: Assistance in needle guidance and placement for procedures  requiring needle placement in or near specific anatomical locations not  easily accessible without such assistance.   C-ARM 1-60 MINUTES  Fluoroscopy was utilized by the requesting physician.  No radiographic  interpretation.     New Prescriptions   No medications on file   Primary Care Physician: Ria Bush, MD Location: [facility] Note by: Kathlen Brunswick.  [name], M.D, DABA, DABAPM, DABPM, DABIPP, FIPP Date: 06/02/2016; Time: 1:18 PM  Disclaimer:  Medicine is not an Chief Strategy Officer. The only guarantee in medicine is that nothing is guaranteed. It is important to note that the decision to proceed with this intervention was based on the information collected from the patient. The Data and conclusions were drawn from the patient's questionnaire, the interview, and the physical examination. Because the information was provided in large part by the patient, it cannot be guaranteed that it has not been purposely or unconsciously manipulated. Every effort has been made to obtain as much relevant data as possible for this evaluation. It is important to note that the conclusions that lead to this procedure are derived in large part from the available data. Always take into account that the treatment will also be dependent on availability of resources and existing treatment guidelines, considered by other Pain Management Practitioners as being common knowledge and practice, at the time of the intervention. For Medico-Legal purposes, it is also important to point out that variation in procedural techniques and pharmacological choices are the acceptable norm. The indications, contraindications, technique, and results of the above procedure should only be interpreted and judged by a Board-Certified Interventional Pain Specialist with extensive familiarity and expertise in the same exact procedure and technique. Attempts at providing opinions without similar or greater experience and expertise than that of the treating physician will be considered as inappropriate and unethical, and shall result in a formal complaint to the state medical board and applicable specialty societies.  Instructions provided at this appointment: Patient Instructions  Pain Management Discharge Instructions  General Discharge Instructions :  If you need to reach your doctor call: Monday-Friday  8:00 am - 4:00 pm at [phone] or toll free [phone].  After clinic hours [phone] to have operator reach doctor.  Bring all of your medication bottles to all your appointments in the pain clinic.  To cancel or reschedule your appointment with Pain Management please remember to call 24 hours in advance to avoid a fee.  Refer to the educational materials which you have been given on: General Risks, I had my Procedure. Discharge Instructions, Post Sedation.  Post Procedure Instructions:  The drugs you were given will stay in your system until tomorrow, so for the next 24 hours you should not drive, make any  legal decisions or drink any alcoholic beverages.  You may eat anything you prefer, but it is better to start with liquids then soups and crackers, and gradually work up to solid foods.  Please notify your doctor immediately if you have any unusual bleeding, trouble breathing or pain that is not related to your normal pain.  Depending on the type of procedure that was done, some parts of your body may feel week and/or numb.  This usually clears up by tonight or the next day.  Walk with the use of an assistive device or accompanied by an adult for the 24 hours.  You may use ice on the affected area for the first 24 hours.  Put ice in a Ziploc bag and cover with a towel and place against area 15 minutes on 15 minutes off.  You may switch to heat after 24 hours.Facet Joint Block The facet joints connect the bones of the spine (vertebrae). They make it possible for you to bend, twist, and make other movements with your spine. They also prevent you from overbending, overtwisting, and making other excessive movements.  A facet joint block is a procedure where a numbing medicine (anesthetic) is injected into a facet joint. Often, a type of anti-inflammatory medicine called a steroid is also injected. A facet joint block may be done for two reasons:   Diagnosis. A facet joint block may  be done as a test to see whether neck or back pain is caused by a worn-down or infected facet joint. If the pain gets better after a facet joint block, it means the pain is probably coming from the facet joint. If the pain does not get better, it means the pain is probably not coming from the facet joint.   Therapy. A facet joint block may be done to relieve neck or back pain caused by a facet joint. A facet joint block is only done as a therapy if the pain does not improve with medicine, exercise programs, physical therapy, and other forms of pain management. LET [facility] CARE PROVIDER KNOW ABOUT:   Any allergies you have.   All medicines you are taking, including vitamins, herbs, eyedrops, and over-the-counter medicines and creams.   Previous problems you or members of your family have had with the use of anesthetics.   Any blood disorders you have had.   Other health problems you have. RISKS AND COMPLICATIONS Generally, having a facet joint block is safe. However, as with any procedure, complications can occur. Possible complications associated with having a facet joint block include:   Bleeding.   Injury to a nerve near the injection site.   Pain at the injection site.   Weakness or numbness in areas controlled by nerves near the injection site.   Infection.   Temporary fluid retention.   Allergic reaction to anesthetics or medicines used during the procedure. BEFORE THE PROCEDURE   Follow your health care provider's instructions if you are taking dietary supplements or medicines. You may need to stop taking them or reduce your dosage.   Do not take any new dietary supplements or medicines without asking your health care provider first.   Follow your health care provider's instructions about eating and drinking before the procedure. You may need to stop eating and drinking several hours before the procedure.   Arrange to have an adult drive you home after  the procedure. PROCEDURE  You may need to remove your clothing and dress in an open-back gown  so that your health care provider can access your spine.   The procedure will be done while you are lying on an X-ray table. Most of the time you will be asked to lie on your stomach, but you may be asked to lie in a different position if an injection will be made in your neck.   Special machines will be used to monitor your oxygen levels, heart rate, and blood pressure.   If an injection will be made in your neck, an intravenous (IV) tube will be inserted into one of your veins. Fluids and medicine will flow directly into your body through the IV tube.   The area over the facet joint where the injection will be made will be cleaned with an antiseptic soap. The surrounding skin will be covered with sterile drapes.   An anesthetic will be applied to your skin to make the injection area numb. You may feel a temporary stinging or burning sensation.   A video X-ray machine will be used to locate the joint. A contrast dye may be injected into the facet joint area to help with locating the joint.   When the joint is located, an anesthetic medicine will be injected into the joint through the needle.   Your health care provider will ask you whether you feel pain relief. If you do feel relief, a steroid may be injected to provide pain relief for a longer period of time. If you do not feel relief or feel only partial relief, additional injections of an anesthetic may be made in other facet joints.   The needle will be removed, the skin will be cleansed, and bandages will be applied.  AFTER THE PROCEDURE   You will be observed for 15-30 minutes before being allowed to go home. Do not drive. Have an adult drive you or take a taxi or public transportation instead.   If you feel pain relief, the pain will return in several hours or days when the anesthetic wears off.   You may feel pain relief 2-14  days after the procedure. The amount of time this relief lasts varies from person to person.   It is normal to feel some tenderness over the injected area(s) for 2 days following the procedure.   If you have diabetes, you may have a temporary increase in blood sugar. This information is not intended to replace advice given to you by your health care provider. Make sure you discuss any questions you have with your health care provider. Document Released: 08/13/2006 Document Revised: 04/14/2014 Document Reviewed: 12/18/2014 Elsevier Interactive Patient Education  2017 [place] After Refer to this sheet in the next few weeks. These instructions provide you with information on caring for yourself after your procedure. Your health care provider may also give you more specific instructions. Your treatment has been planned according to current medical practices, but problems sometimes occur. Call your health care provider if you have any problems or questions after your procedure. HOME CARE INSTRUCTIONS   Keep track of the amount of pain relief you feel and how long it lasts.  Limit pain medicine within the first 4-6 hours after the procedure as directed by your health care provider.  Resume taking dietary supplements and medicines as directed by your health care provider.  You may resume your regular diet.  Do not apply heat near or over the injection site(s) for 24 hours.   Do not take a bath or soak  in water (such as a pool or lake) for 24 hours.  Do not drive for 24 hours unless approved by your health care provider.  Avoid strenuous activity for 24 hours.  Remove your bandages the morning after the procedure.   If the injection site is tender, applying an ice pack may relieve some tenderness. To do this:  Put ice in a bag.  Place a towel between your skin and the bag.  Leave the ice on for 15-20 minutes, 3-4 times a day.  Keep follow-up  appointments as directed by your health care provider. SEEK MEDICAL CARE IF:   Your pain is not controlled by your medicines.   There is drainage from the injection site.   There is significant bleeding or swelling at the injection site.  You have diabetes and your blood sugar is above 180 mg/dL. SEEK IMMEDIATE MEDICAL CARE IF:   You develop a fever of 101F (38.3C) or greater.   You have worsening pain or swelling around the injection site.   You have red streaking around the injection site.   You develop severe pain that is not controlled by your medicines.   You develop a headache, stiff neck, nausea, or vomiting.   Your eyes become very sensitive to light.   You have weakness, paralysis, or tingling in your arms or legs that was not present before the procedure.   You develop difficulty urinating or breathing.  This information is not intended to replace advice given to you by your health care provider. Make sure you discuss any questions you have with your health care provider. Document Released: 03/10/2012 Document Revised: 04/14/2014 Document Reviewed: 12/18/2014 Elsevier Interactive Patient Education  2017 [employer].

## 2016-06-02 NOTE — Patient Instructions (Signed)
Pain Management Discharge Instructions  General Discharge Instructions :  If you need to reach your doctor call: Monday-Friday 8:00 am - 4:00 pm at [phone] or toll free [phone].  After clinic hours [phone] to have operator reach doctor.  Bring all of your medication bottles to all your appointments in the pain clinic.  To cancel or reschedule your appointment with Pain Management please remember to call 24 hours in advance to avoid a fee.  Refer to the educational materials which you have been given on: General Risks, I had my Procedure. Discharge Instructions, Post Sedation.  Post Procedure Instructions:  The drugs you were given will stay in your system until tomorrow, so for the next 24 hours you should not drive, make any legal decisions or drink any alcoholic beverages.  You may eat anything you prefer, but it is better to start with liquids then soups and crackers, and gradually work up to solid foods.  Please notify your doctor immediately if you have any unusual bleeding, trouble breathing or pain that is not related to your normal pain.  Depending on the type of procedure that was done, some parts of your body may feel week and/or numb.  This usually clears up by tonight or the next day.  Walk with the use of an assistive device or accompanied by an adult for the 24 hours.  You may use ice on the affected area for the first 24 hours.  Put ice in a Ziploc bag and cover with a towel and place against area 15 minutes on 15 minutes off.  You may switch to heat after 24 hours.Facet Joint Block The facet joints connect the bones of the spine (vertebrae). They make it possible for you to bend, twist, and make other movements with your spine. They also prevent you from overbending, overtwisting, and making other excessive movements.  A facet joint block is a procedure where a numbing medicine (anesthetic) is injected into a facet joint. Often, a type of anti-inflammatory  medicine called a steroid is also injected. A facet joint block may be done for two reasons:   Diagnosis. A facet joint block may be done as a test to see whether neck or back pain is caused by a worn-down or infected facet joint. If the pain gets better after a facet joint block, it means the pain is probably coming from the facet joint. If the pain does not get better, it means the pain is probably not coming from the facet joint.   Therapy. A facet joint block may be done to relieve neck or back pain caused by a facet joint. A facet joint block is only done as a therapy if the pain does not improve with medicine, exercise programs, physical therapy, and other forms of pain management. LET YOUR HEALTH CARE PROVIDER KNOW ABOUT:   Any allergies you have.   All medicines you are taking, including vitamins, herbs, eyedrops, and over-the-counter medicines and creams.   Previous problems you or members of your family have had with the use of anesthetics.   Any blood disorders you have had.   Other health problems you have. RISKS AND COMPLICATIONS Generally, having a facet joint block is safe. However, as with any procedure, complications can occur. Possible complications associated with having a facet joint block include:   Bleeding.   Injury to a nerve near the injection site.   Pain at the injection site.   Weakness or numbness in areas controlled by nerves near   the injection site.   Infection.   Temporary fluid retention.   Allergic reaction to anesthetics or medicines used during the procedure. BEFORE THE PROCEDURE   Follow your health care provider's instructions if you are taking dietary supplements or medicines. You may need to stop taking them or reduce your dosage.   Do not take any new dietary supplements or medicines without asking your health care provider first.   Follow your health care provider's instructions about eating and drinking before the  procedure. You may need to stop eating and drinking several hours before the procedure.   Arrange to have an adult drive you home after the procedure. PROCEDURE  You may need to remove your clothing and dress in an open-back gown so that your health care provider can access your spine.   The procedure will be done while you are lying on an X-ray table. Most of the time you will be asked to lie on your stomach, but you may be asked to lie in a different position if an injection will be made in your neck.   Special machines will be used to monitor your oxygen levels, heart rate, and blood pressure.   If an injection will be made in your neck, an intravenous (IV) tube will be inserted into one of your veins. Fluids and medicine will flow directly into your body through the IV tube.   The area over the facet joint where the injection will be made will be cleaned with an antiseptic soap. The surrounding skin will be covered with sterile drapes.   An anesthetic will be applied to your skin to make the injection area numb. You may feel a temporary stinging or burning sensation.   A video X-ray machine will be used to locate the joint. A contrast dye may be injected into the facet joint area to help with locating the joint.   When the joint is located, an anesthetic medicine will be injected into the joint through the needle.   Your health care provider will ask you whether you feel pain relief. If you do feel relief, a steroid may be injected to provide pain relief for a longer period of time. If you do not feel relief or feel only partial relief, additional injections of an anesthetic may be made in other facet joints.   The needle will be removed, the skin will be cleansed, and bandages will be applied.  AFTER THE PROCEDURE   You will be observed for 15-30 minutes before being allowed to go home. Do not drive. Have an adult drive you or take a taxi or public transportation instead.    If you feel pain relief, the pain will return in several hours or days when the anesthetic wears off.   You may feel pain relief 2-14 days after the procedure. The amount of time this relief lasts varies from person to person.   It is normal to feel some tenderness over the injected area(s) for 2 days following the procedure.   If you have diabetes, you may have a temporary increase in blood sugar. This information is not intended to replace advice given to you by your health care provider. Make sure you discuss any questions you have with your health care provider. Document Released: 08/13/2006 Document Revised: 04/14/2014 Document Reviewed: 12/18/2014 Elsevier Interactive Patient Education  2017 [employer] Facet Joint Block, Care After Refer to this sheet in the next few weeks. These instructions provide you with information on caring   for yourself after your procedure. Your health care provider may also give you more specific instructions. Your treatment has been planned according to current medical practices, but problems sometimes occur. Call your health care provider if you have any problems or questions after your procedure. HOME CARE INSTRUCTIONS   Keep track of the amount of pain relief you feel and how long it lasts.  Limit pain medicine within the first 4-6 hours after the procedure as directed by your health care provider.  Resume taking dietary supplements and medicines as directed by your health care provider.  You may resume your regular diet.  Do not apply heat near or over the injection site(s) for 24 hours.   Do not take a bath or soak in water (such as a pool or lake) for 24 hours.  Do not drive for 24 hours unless approved by your health care provider.  Avoid strenuous activity for 24 hours.  Remove your bandages the morning after the procedure.   If the injection site is tender, applying an ice pack may relieve some tenderness. To do this:  Put  ice in a bag.  Place a towel between your skin and the bag.  Leave the ice on for 15-20 minutes, 3-4 times a day.  Keep follow-up appointments as directed by your health care provider. SEEK MEDICAL CARE IF:   Your pain is not controlled by your medicines.   There is drainage from the injection site.   There is significant bleeding or swelling at the injection site.  You have diabetes and your blood sugar is above 180 mg/dL. SEEK IMMEDIATE MEDICAL CARE IF:   You develop a fever of 101F (38.3C) or greater.   You have worsening pain or swelling around the injection site.   You have red streaking around the injection site.   You develop severe pain that is not controlled by your medicines.   You develop a headache, stiff neck, nausea, or vomiting.   Your eyes become very sensitive to light.   You have weakness, paralysis, or tingling in your arms or legs that was not present before the procedure.   You develop difficulty urinating or breathing.  This information is not intended to replace advice given to you by your health care provider. Make sure you discuss any questions you have with your health care provider. Document Released: 03/10/2012 Document Revised: 04/14/2014 Document Reviewed: 12/18/2014 Elsevier Interactive Patient Education  2017 [employer] Inc[employer]  

## 2016-06-03 ENCOUNTER — Telehealth: Payer: Self-pay | Admitting: *Deleted

## 2016-06-03 NOTE — Telephone Encounter (Signed)
Spoke with patient re; procedure on yesterday.  Verbalizes no questions or concerns.  

## 2016-06-04 DIAGNOSIS — H04123 Dry eye syndrome of bilateral lacrimal glands: Secondary | ICD-10-CM | POA: Diagnosis not present

## 2016-06-05 ENCOUNTER — Encounter: Payer: Self-pay | Admitting: Family Medicine

## 2016-06-05 ENCOUNTER — Encounter: Payer: Self-pay | Admitting: *Deleted

## 2016-06-05 ENCOUNTER — Ambulatory Visit (INDEPENDENT_AMBULATORY_CARE_PROVIDER_SITE_OTHER): Payer: Medicare Other | Admitting: Family Medicine

## 2016-06-05 VITALS — BP 118/76 | HR 72 | Temp 97.9°F | Wt 142.2 lb

## 2016-06-05 DIAGNOSIS — K582 Mixed irritable bowel syndrome: Secondary | ICD-10-CM | POA: Diagnosis not present

## 2016-06-05 DIAGNOSIS — G894 Chronic pain syndrome: Secondary | ICD-10-CM | POA: Diagnosis not present

## 2016-06-05 DIAGNOSIS — I10 Essential (primary) hypertension: Secondary | ICD-10-CM

## 2016-06-05 DIAGNOSIS — F331 Major depressive disorder, recurrent, moderate: Secondary | ICD-10-CM | POA: Diagnosis not present

## 2016-06-05 LAB — BASIC METABOLIC PANEL
BUN: 13 mg/dL (ref 6–23)
CO2: 31 meq/L (ref 19–32)
Calcium: 9.6 mg/dL (ref 8.4–10.5)
Chloride: 99 mEq/L (ref 96–112)
Creatinine, Ser: 0.85 mg/dL (ref 0.40–1.20)
GFR: 68.41 mL/min (ref >60.00)
GLUCOSE: 100 mg/dL — AB (ref 70–99)
POTASSIUM: 3.8 meq/L (ref 3.5–5.1)
SODIUM: 136 meq/L (ref 135–145)

## 2016-06-05 MED ORDER — AMLODIPINE BESYLATE 2.5 MG PO TABS: 2.5000 mg | ORAL_TABLET | Freq: Every day | ORAL | 3 refills | Status: DC

## 2016-06-05 MED ORDER — HYOSCYAMINE SULFATE 0.125 MG PO TABS: 0.1250 mg | ORAL_TABLET | Freq: Two times a day (BID) | ORAL | 3 refills | Status: DC | PRN

## 2016-06-05 NOTE — Assessment & Plan Note (Signed)
Chronic, stable. Continue current regimen. 

## 2016-06-05 NOTE — Assessment & Plan Note (Signed)
Back on high dose lyrica 150mg  TID. Will check Cr to ensure kidneys tolerating high dose.

## 2016-06-05 NOTE — Patient Instructions (Addendum)
Labs today. You are doing well today. Medicines refilled today.  Return in 5 months for medicare wellness visit and physical.

## 2016-06-05 NOTE — Assessment & Plan Note (Signed)
Off wellbutrin, off prozac. Doing well.

## 2016-06-05 NOTE — Progress Notes (Signed)
BP 118/76   Pulse 72 Comment: Irregular  Temp 97.9 F (36.6 C) (Oral)   Wt 142 lb 4 oz (64.5 kg)   BMI 27.78 kg/m    CC: 4 mo /fu visit Subjective:    Patient ID: Cheyenne Pierce, female    DOB: 1935/04/06, 82 y.o.   MRN: [id number]  HPI: Cheyenne Pierce is a 82 y.o. female presenting on 06/05/2016 for Follow-up   Had 5 cervical injections done this week. Followed by Dr [name]. Back on lyrica 150mg  TID and this is helping as well. Off gabapentin.   HTN - Compliant with current antihypertensive regimen of low dose amlodipine and maxzide. Does not check blood pressures at home. No low blood pressure readings or symptoms of dizziness/syncope. Denies HA, vision changes, CP/tightness, SOB, leg swelling.    Relevant past medical, surgical, family and social history reviewed and updated as indicated. Interim medical history since our last visit reviewed. Allergies and medications reviewed and updated. Outpatient Medications Prior to Visit  Medication Sig Dispense Refill  . acetaminophen (TYLENOL) 500 MG tablet Take 500 mg by mouth every 6 (six) hours as needed.    Marland Kitchen aspirin 81 MG tablet Take 81 mg by mouth. Twice a week    . ASTRAGALUS PO Take 500 mg by mouth daily.    . Biotin 1000 MCG tablet Take 1,000 mcg by mouth daily.    . calcium-vitamin D (OSCAL WITH D) 500-200 MG-UNIT tablet Take 1 tablet by mouth.    . diphenhydrAMINE (BENADRYL) 25 mg capsule Take 25 mg by mouth at bedtime.     Marland Kitchen esomeprazole (NEXIUM) 40 MG capsule Take 1 capsule (40 mg total) by mouth 2 (two) times daily before a meal. Take while on ibuprofen. 28 capsule 0  . Lactobacillus Rhamnosus, GG, (CULTURELLE PO) Take 1 capsule by mouth daily.    Marland Kitchen levothyroxine (LEVOXYL) 75 MCG tablet Take 75 mcg by mouth daily. Take extra pill on Wednesday and Sunday    . loperamide (IMODIUM) 2 MG capsule Take by mouth as needed for diarrhea or loose stools.    . Magnesium 250 MG TABS Take by mouth daily.    . meclizine (ANTIVERT)  25 MG tablet take 1 tablet by mouth three times a day if needed 30 tablet 1  . meloxicam (MOBIC) 7.5 MG tablet Take 1 tablet (7.5 mg total) by mouth daily. 30 tablet 0  . Multiple Vitamins-Minerals (CENTRUM SILVER PO) Take by mouth daily.     . nitroGLYCERIN (NITROSTAT) 0.4 MG SL tablet Place 0.4 mg under the tongue every 5 (five) minutes as needed.    . pantoprazole (PROTONIX) 40 MG tablet Take 1 tablet (40 mg total) by mouth daily. 90 tablet 3  . pregabalin (LYRICA) 150 MG capsule Take 1 capsule (150 mg total) by mouth 3 (three) times daily. 90 capsule 2  . triamterene-hydrochlorothiazide (MAXZIDE-25) 37.5-25 MG tablet take 1 tablet by mouth once daily 30 tablet 11  . amLODipine (NORVASC) 2.5 MG tablet take 1 tablet by mouth once daily 30 tablet 0  . hyoscyamine (LEVSIN, ANASPAZ) 0.125 MG tablet Take 1 tablet (0.125 mg total) by mouth 2 (two) times daily as needed. 30 tablet 0   No facility-administered medications prior to visit.      Per HPI unless specifically indicated in ROS section below Review of Systems     Objective:    BP 118/76   Pulse 72 Comment: Irregular  Temp 97.9 F (36.6 C) (Oral)   Wt  142 lb 4 oz (64.5 kg)   BMI 27.78 kg/m   Wt Readings from Last 3 Encounters:  06/05/16 142 lb 4 oz (64.5 kg)  06/02/16 135 lb (61.2 kg)  05/27/16 136 lb (61.7 kg)    Physical Exam  Constitutional: She appears well-developed and well-nourished. No distress.  HENT:  Mouth/Throat: Oropharynx is clear and moist. No oropharyngeal exudate.  Eyes: Conjunctivae and EOM are normal. Pupils are equal, round, and reactive to light. No scleral icterus.  Neck: Normal range of motion.  Cardiovascular: Normal rate, regular rhythm and intact distal pulses.   Occasional extrasystoles are present.  No murmur heard. Pulmonary/Chest: Effort normal and breath sounds normal. No respiratory distress. She has no wheezes. She has no rales.  Musculoskeletal: She exhibits no edema.  Skin: Skin is warm  and dry. No rash noted.  Psychiatric: She has a normal mood and affect.  Nursing note and vitals reviewed.  Results for orders placed or performed in visit on 01/07/16  CBC with Differential/Platelet  Result Value Ref Range   WBC 4.9 4.0 - 10.5 K/uL   RBC 4.58 3.87 - 5.11 Mil/uL   Hemoglobin 13.8 12.0 - 15.0 g/dL   HCT 40.9 36.0 - 46.0 %   MCV 89.2 78.0 - 100.0 fl   MCHC 33.8 30.0 - 36.0 g/dL   RDW 15.3 11.5 - 15.5 %   Platelets 337.0 150.0 - 400.0 K/uL   Neutrophils Relative % 54.8 43.0 - 77.0 %   Lymphocytes Relative 34.3 12.0 - 46.0 %   Monocytes Relative 8.6 3.0 - 12.0 %   Eosinophils Relative 1.4 0.0 - 5.0 %   Basophils Relative 0.9 0.0 - 3.0 %   Neutro Abs 2.7 1.4 - 7.7 K/uL   Lymphs Abs 1.7 0.7 - 4.0 K/uL   Monocytes Absolute 0.4 0.1 - 1.0 K/uL   Eosinophils Absolute 0.1 0.0 - 0.7 K/uL   Basophils Absolute 0.0 0.0 - 0.1 K/uL  Basic metabolic panel  Result Value Ref Range   Sodium 135 135 - 145 mEq/L   Potassium 3.8 3.5 - 5.1 mEq/L   Chloride 98 96 - 112 mEq/L   CO2 31 19 - 32 mEq/L   Glucose, Bld 79 70 - 99 mg/dL   BUN 10 6 - 23 mg/dL   Creatinine, Ser 0.64 0.40 - 1.20 mg/dL   Calcium 9.4 8.4 - 10.5 mg/dL   GFR 95.02 >60.00 mL/min  TSH  Result Value Ref Range   TSH 4.38 0.35 - 4.50 uIU/mL  T4, free  Result Value Ref Range   Free T4 1.03 0.60 - 1.60 ng/dL  Sedimentation rate  Result Value Ref Range   Sed Rate 11 0 - 30 mm/hr      Assessment & Plan:   Problem List Items Addressed This Visit    Chronic pain syndrome (Chronic)    Back on high dose lyrica 150mg  TID. Will check Cr to ensure kidneys tolerating high dose.       Essential hypertension - Primary    Chronic, stable. Continue current regimen.      Relevant Medications   amLODipine (NORVASC) 2.5 MG tablet   Other Relevant Orders   Basic metabolic panel   Irritable bowel syndrome    Refill hyoscyamine PRN. Rare use.       Relevant Medications   hyoscyamine (LEVSIN, ANASPAZ) 0.125 MG tablet     MDD (major depressive disorder), recurrent episode, moderate ([place])    Off wellbutrin, off prozac. Doing well.  Follow up plan: Return in about 5 months (around 11/05/2016) for annual exam, prior fasting for blood work, medicare wellness visit.  Ria Bush, MD

## 2016-06-05 NOTE — Progress Notes (Signed)
Pre visit review using our clinic review tool, if applicable. No additional management support is needed unless otherwise documented below in the visit note. 

## 2016-06-05 NOTE — Assessment & Plan Note (Signed)
Refill hyoscyamine PRN. Rare use.

## 2016-06-13 ENCOUNTER — Telehealth: Payer: Self-pay | Admitting: Cardiovascular Disease

## 2016-06-13 NOTE — Telephone Encounter (Signed)
Spoke with patient and she doesn't want to schedule an Echocardiogram (1 yr f/u)  right now due to other health concerns. She will call back later to schedule.

## 2016-07-01 ENCOUNTER — Ambulatory Visit: Payer: Medicare Other | Attending: Pain Medicine | Admitting: Pain Medicine

## 2016-07-01 ENCOUNTER — Encounter: Payer: Self-pay | Admitting: Pain Medicine

## 2016-07-01 VITALS — BP 127/80 | HR 77 | Temp 97.7°F | Resp 16 | Ht 60.0 in | Wt 135.0 lb

## 2016-07-01 DIAGNOSIS — G894 Chronic pain syndrome: Secondary | ICD-10-CM | POA: Insufficient documentation

## 2016-07-01 DIAGNOSIS — G8929 Other chronic pain: Secondary | ICD-10-CM | POA: Diagnosis not present

## 2016-07-01 DIAGNOSIS — M47812 Spondylosis without myelopathy or radiculopathy, cervical region: Secondary | ICD-10-CM | POA: Insufficient documentation

## 2016-07-01 DIAGNOSIS — I10 Essential (primary) hypertension: Secondary | ICD-10-CM | POA: Insufficient documentation

## 2016-07-01 DIAGNOSIS — M48061 Spinal stenosis, lumbar region without neurogenic claudication: Secondary | ICD-10-CM | POA: Insufficient documentation

## 2016-07-01 DIAGNOSIS — Z90710 Acquired absence of both cervix and uterus: Secondary | ICD-10-CM | POA: Diagnosis not present

## 2016-07-01 DIAGNOSIS — K2100 Gastro-esophageal reflux disease with esophagitis, without bleeding: Secondary | ICD-10-CM

## 2016-07-01 DIAGNOSIS — Z87891 Personal history of nicotine dependence: Secondary | ICD-10-CM | POA: Insufficient documentation

## 2016-07-01 DIAGNOSIS — M542 Cervicalgia: Secondary | ICD-10-CM | POA: Diagnosis not present

## 2016-07-01 DIAGNOSIS — M1288 Other specific arthropathies, not elsewhere classified, other specified site: Secondary | ICD-10-CM | POA: Diagnosis not present

## 2016-07-01 DIAGNOSIS — Z9889 Other specified postprocedural states: Secondary | ICD-10-CM | POA: Diagnosis not present

## 2016-07-01 DIAGNOSIS — Z7982 Long term (current) use of aspirin: Secondary | ICD-10-CM | POA: Diagnosis not present

## 2016-07-01 DIAGNOSIS — K219 Gastro-esophageal reflux disease without esophagitis: Secondary | ICD-10-CM | POA: Diagnosis not present

## 2016-07-01 DIAGNOSIS — M4802 Spinal stenosis, cervical region: Secondary | ICD-10-CM | POA: Diagnosis not present

## 2016-07-01 DIAGNOSIS — Z888 Allergy status to other drugs, medicaments and biological substances status: Secondary | ICD-10-CM | POA: Insufficient documentation

## 2016-07-01 DIAGNOSIS — Z880 Allergy status to penicillin: Secondary | ICD-10-CM | POA: Diagnosis not present

## 2016-07-01 DIAGNOSIS — M150 Primary generalized (osteo)arthritis: Secondary | ICD-10-CM | POA: Diagnosis not present

## 2016-07-01 DIAGNOSIS — M47892 Other spondylosis, cervical region: Secondary | ICD-10-CM | POA: Insufficient documentation

## 2016-07-01 DIAGNOSIS — K582 Mixed irritable bowel syndrome: Secondary | ICD-10-CM | POA: Diagnosis not present

## 2016-07-01 DIAGNOSIS — M1991 Primary osteoarthritis, unspecified site: Secondary | ICD-10-CM | POA: Insufficient documentation

## 2016-07-01 DIAGNOSIS — M5116 Intervertebral disc disorders with radiculopathy, lumbar region: Secondary | ICD-10-CM | POA: Diagnosis not present

## 2016-07-01 DIAGNOSIS — K580 Irritable bowel syndrome with diarrhea: Secondary | ICD-10-CM | POA: Diagnosis not present

## 2016-07-01 DIAGNOSIS — Z09 Encounter for follow-up examination after completed treatment for conditions other than malignant neoplasm: Secondary | ICD-10-CM | POA: Diagnosis present

## 2016-07-01 DIAGNOSIS — Z885 Allergy status to narcotic agent status: Secondary | ICD-10-CM | POA: Diagnosis not present

## 2016-07-01 DIAGNOSIS — M159 Polyosteoarthritis, unspecified: Secondary | ICD-10-CM

## 2016-07-01 MED ORDER — MELOXICAM 15 MG PO TABS: 15.0000 mg | ORAL_TABLET | Freq: Every day | ORAL | 0 refills | Status: DC

## 2016-07-01 MED ORDER — ESOMEPRAZOLE MAGNESIUM 40 MG PO CPDR: 40.0000 mg | DELAYED_RELEASE_CAPSULE | Freq: Every day | ORAL | 0 refills | Status: DC

## 2016-07-01 NOTE — Progress Notes (Signed)
Safety precautions to be maintained throughout the outpatient stay will include: orient to surroundings, keep bed in low position, maintain call bell within reach at all times, provide assistance with transfer out of bed and ambulation.  

## 2016-07-01 NOTE — Patient Instructions (Signed)
Radiofrequency Lesioning Radiofrequency lesioning is a procedure that is performed to relieve pain. The procedure is often used for back, neck, or arm pain. Radiofrequency lesioning involves the use of a machine that creates radio waves to make heat. During the procedure, the heat is applied to the nerve that carries the pain signal. The heat damages the nerve and interferes with the pain signal. Pain relief usually starts about 2 weeks after the procedure and lasts for 6 months to 1 year. Tell a health care provider about:  Any allergies you have.  All medicines you are taking, including vitamins, herbs, eye drops, creams, and over-the-counter medicines.  Any problems you or family members have had with anesthetic medicines.  Any blood disorders you have.  Any surgeries you have had.  Any medical conditions you have.  Whether you are pregnant or may be pregnant. What are the risks? Generally, this is a safe procedure. However, problems may occur, including:  Pain or soreness at the injection site.  Infection at the injection site.  Damage to nerves or blood vessels. What happens before the procedure?  Ask your health care provider about:  Changing or stopping your regular medicines. This is especially important if you are taking diabetes medicines or blood thinners.  Taking medicines such as aspirin and ibuprofen. These medicines can thin your blood. Do not take these medicines before your procedure if your health care provider instructs you not to.  Follow instructions from your health care provider about eating or drinking restrictions.  Plan to have someone take you home after the procedure.  If you go home right after the procedure, plan to have someone with you for 24 hours. What happens during the procedure?  You will be given one or more of the following:  A medicine to help you relax (sedative).  A medicine to numb the area (local anesthetic).  You will be awake  during the procedure. You will need to be able to talk with the health care provider during the procedure.  With the help of a type of X-ray (fluoroscopy), the health care provider will insert a radiofrequency needle into the area to be treated.  Next, a wire that carries the radio waves (electrode) will be put through the radiofrequency needle. An electrical pulse will be sent through the electrode to verify the correct nerve. You will feel a tingling sensation, and you may have muscle twitching.  Then, the tissue that is around the needle tip will be heated by an electric current that is passed using the radiofrequency machine. This will numb the nerves.  A bandage (dressing) will be put on the insertion area after the procedure is done. The procedure may vary among health care providers and hospitals. What happens after the procedure?  Your blood pressure, heart rate, breathing rate, and blood oxygen level will be monitored often until the medicines you were given have worn off.  Return to your normal activities as directed by your health care provider. This information is not intended to replace advice given to you by your health care provider. Make sure you discuss any questions you have with your health care provider. Document Released: 11/20/2010 Document Revised: 08/30/2015 Document Reviewed: 05/01/2014 Elsevier Interactive Patient Education  2017 [employer]  

## 2016-07-01 NOTE — Progress Notes (Signed)
Patient's Name: Cheyenne Pierce  MRN: [id number]  Referring Provider: Ria Bush, MD  DOB: 09-24-1934  PCP: Ria Bush, MD  DOS: 07/01/2016  Note by: Kathlen Brunswick. [name], MD  Service setting: Ambulatory outpatient  Specialty: Interventional Pain Management  Location: [facility] (AMB) Pain Management Facility    Patient type: Established   Primary Reason(s) for Visit: Encounter for post-procedure evaluation of chronic illness with mild to moderate exacerbation CC: Neck Pain (between shoulder blades and up her neck)  HPI  Cheyenne Pierce is a 82 y.o. year old, female patient, who comes today for a post-procedure evaluation. She has Hypothyroidism; GAD (generalized anxiety disorder); Hereditary and idiopathic peripheral neuropathy; Essential hypertension; Reflux esophagitis; GERD; INGUINAL HERNIA, LEFT; Irritable bowel syndrome; Other chronic cystitis; Osteopenia; Systolic murmur; Medicare annual wellness visit, subsequent; PVCs (premature ventricular contractions); Prediabetes; Memory deficit; Advanced care planning/counseling discussion; HLD (hyperlipidemia); Health maintenance examination; Recurrent falls; Night sweats; Chronic pain syndrome; Encounter for therapeutic drug level monitoring; Encounter for chronic pain management; Chronic midline low back pain with left-sided sciatica; Chronic neck pain (Location of Primary Source of Pain) (Bilateral) (R>L); Cervical spondylosis; Chronic cervical radicular pain (Bilateral) (R>L); Numbness of upper extremity (Bilateral) (R>L); Upper extremity weakness (Bilateral); Neurogenic pain; Neuropathic pain; Inflammatory pain; Chronic Lumbar Radicular pain (Location of Secondary source of pain) (S1) (Left); Allodynia; Encounter for long-term current use of medication; Lumbar spondylosis; Chronic Lumbar Radiculopathy (Left); Chronic lower extremity pain (Bilateral) (L>R); Chronic hip pain (Location of Tertiary source of pain) (Right); Abnormal Cervical MRI (01/24/15);  Cervical spinal stenosis (C3 through C7); Cervical facet hypertrophy (Bilateral); Cervical foraminal stenosis (C7-T1) (Bilateral); Chronic cervical radiculopathy (Bilateral) (R>L) (pain, numbness, and weakness); Abnormal Lumbar MRI (11/10/2014); Lumbar spinal stenosis (L3-4); Lumbar foraminal stenosis (Bilateral L3-4) (Right L4-5); MDD (major depressive disorder), recurrent episode, moderate ([place]); Fibromyalgia; Neck pain on right side; Cervical facet syndrome (Right); Cervical fusion syndrome (C3-C7 ACDF); and Primary osteoarthritis involving multiple joints on her problem list. Her primarily concern today is the Neck Pain (between shoulder blades and up her neck)  Pain Assessment: Self-Reported Pain Score: 4 /10             Reported level is compatible with observation.       Pain Type: Chronic pain Pain Location: Neck (lower back) Pain Orientation: Mid (patient states that she went to dinner last evening and road in the back of the car.  states the car ride and all the bumping flared the pain up again in her neck and the pain which was at a 0 and last night at a 6 and this morning she states she is at 4) Pain Descriptors / Indicators: Shooting, Radiating, Constant Pain Frequency: Constant  Cheyenne Pierce comes in today for post-procedure evaluation after the treatment done on 06/02/2016. The patient indicates that she was doing really well after the procedure until she strained herself and the pain started coming back. She was very happy with the results of the cervical facet block and would like to have it repeated. In addition, she indicates that she would like to have the same thing done in the lumbar region.  Further details on both, my assessment(s), as well as the proposed treatment plan, please see below.  Post-Procedure Assessment  06/02/2016 Procedure: Diagnostic right-sided cervical facet block under fluoroscopic guidance and IV sedation Pre-procedure pain score:  4/10 Post-procedure pain  score: 0/10 (100% relief) Influential Factors: BMI: 26.37 kg/m Intra-procedural challenges: None observed Assessment challenges: None detected  Post-procedural side-effects, adverse reactions, or complications: None reported Reported issues: None  Sedation: Sedation provided. When no sedatives are used, the analgesic levels obtained are directly associated to the effectiveness of the local anesthetics. However, when sedation is provided, the level of analgesia obtained during the initial 1 hour following the intervention, is believed to be the result of a combination of factors. These factors may include, but are not limited to: 1. The effectiveness of the local anesthetics used. 2. The effects of the analgesic(s) and/or anxiolytic(s) used. 3. The degree of discomfort experienced by the patient at the time of the procedure. 4. The patients ability and reliability in recalling and recording the events. 5. The presence and influence of possible secondary gains and/or psychosocial factors. Reported result: Relief experienced during the 1st hour after the procedure: 100 % (Ultra-Short Term Relief) Interpretative annotation: Analgesia during this period is likely to be Local Anesthetic and/or IV Sedative (Analgesic/Anxiolitic) related.          Effects of local anesthetic: The analgesic effects attained during this period are directly associated to the localized infiltration of local anesthetics and therefore cary significant diagnostic value as to the etiological location, or anatomical origin, of the pain. Expected duration of relief is directly dependent on the pharmacodynamics of the local anesthetic used. Long-acting (4-6 hours) anesthetics used.  Reported result: Relief during the next 4 to 6 hour after the procedure: 70 % (Short-Term Relief) Interpretative annotation: Complete relief would suggest area to be the source of the pain.          Long-term benefit: Defined as the period of  time past the expected duration of local anesthetics. With the possible exception of prolonged sympathetic blockade from the local anesthetics, benefits during this period are typically attributed to, or associated with, other factors such as analgesic sensory neuropraxia, antiinflammatory effects, or beneficial biochemical changes provided by agents other than the local anesthetics Reported result: Extended relief following procedure: 90 % (patient states the neck flared again after car ride last evening, see note in clinical intake. ) (Long-Term Relief) Interpretative annotation: Good relief. This could suggest inflammation to be a significant component in the etiology to the pain.          Current benefits: Defined as persistent relief that continues at this point in time.   Reported results: Treated area: <50 % Cheyenne Pierce reports improvement in function. The patient indicates that her pain was improved by 90% until last night when she had a flareup of her neck pain secondary to his straining movement that she made while she was in the car. However, she indicates that the pain that she was experiencing in her right ear and right occipital region has been completely gone since the procedure. Interpretative annotation: Ongoing benefits would suggest effective therapeutic approach  Interpretation: Results would suggest a successful diagnostic intervention.          Laboratory Chemistry  Inflammation Markers Lab Results  Component Value Date   CRP 0.7 09/12/2014   ESRSEDRATE 11 01/07/2016   (CRP: Acute Phase) (ESR: Chronic Phase) Renal Function Markers Lab Results  Component Value Date   BUN 13 06/05/2016   CREATININE 0.85 06/05/2016   GFRAA >60 05/22/2015   GFRNONAA >60 05/22/2015   Hepatic Function Markers Lab Results  Component Value Date   AST 27 02/07/2015   ALT 21 02/07/2015   ALBUMIN 4.0 02/07/2015   ALKPHOS 55 02/07/2015   Electrolytes Lab Results  Component Value Date  NA 136 06/05/2016   K 3.8 06/05/2016   CL 99 06/05/2016   CALCIUM 9.6 06/05/2016   Neuropathy Markers Lab Results  Component Value Date   VITAMINB12 >1500 (H) 09/22/2012   Bone Pathology Markers Lab Results  Component Value Date   ALKPHOS 55 02/07/2015   VD25OH 47.39 10/04/2013   CALCIUM 9.6 06/05/2016   Coagulation Parameters Lab Results  Component Value Date   INR 1.0 11/08/2012   LABPROT 13.8 11/08/2012   APTT 32.6 11/08/2012   PLT 337.0 01/07/2016   Cardiovascular Markers Lab Results  Component Value Date   HGB 13.8 01/07/2016   HCT 40.9 01/07/2016   Note: Lab results reviewed.  Recent Diagnostic Imaging Review  Dg C-arm 1-60 Min-no Report  Result Date: 06/02/2016 Fluoroscopy was utilized by the requesting physician.  No radiographic interpretation.   Note: Imaging results reviewed.          Meds  The patient has a current medication list which includes the following prescription(s): acetaminophen, amlodipine, aspirin, astragalus, biotin, calcium-vitamin d, diphenhydramine, esomeprazole, hyoscyamine, lactobacillus rhamnosus (gg), levothyroxine, loperamide, magnesium, meclizine, meloxicam, multiple vitamins-minerals, nitroglycerin, pantoprazole, pregabalin, and triamterene-hydrochlorothiazide.  Current Outpatient Prescriptions on File Prior to Visit  Medication Sig  . acetaminophen (TYLENOL) 500 MG tablet Take 500 mg by mouth every 6 (six) hours as needed.  Marland Kitchen amLODipine (NORVASC) 2.5 MG tablet Take 1 tablet (2.5 mg total) by mouth daily.  Marland Kitchen aspirin 81 MG tablet Take 81 mg by mouth. Twice a week  . ASTRAGALUS PO Take 500 mg by mouth daily.  . Biotin 1000 MCG tablet Take 1,000 mcg by mouth daily.  . calcium-vitamin D (OSCAL WITH D) 500-200 MG-UNIT tablet Take 1 tablet by mouth.  . diphenhydrAMINE (BENADRYL) 25 mg capsule Take 25 mg by mouth at bedtime.   . hyoscyamine (LEVSIN, ANASPAZ) 0.125 MG tablet Take 1 tablet (0.125 mg total) by mouth 2 (two) times daily as  needed.  . Lactobacillus Rhamnosus, GG, (CULTURELLE PO) Take 1 capsule by mouth daily.  Marland Kitchen levothyroxine (LEVOXYL) 75 MCG tablet Take 75 mcg by mouth daily. Take extra pill on Wednesday and Sunday  . loperamide (IMODIUM) 2 MG capsule Take by mouth as needed for diarrhea or loose stools.  . Magnesium 250 MG TABS Take by mouth daily.  . meclizine (ANTIVERT) 25 MG tablet take 1 tablet by mouth three times a day if needed  . Multiple Vitamins-Minerals (CENTRUM SILVER PO) Take by mouth daily.   . nitroGLYCERIN (NITROSTAT) 0.4 MG SL tablet Place 0.4 mg under the tongue every 5 (five) minutes as needed.  . pantoprazole (PROTONIX) 40 MG tablet Take 1 tablet (40 mg total) by mouth daily.  . pregabalin (LYRICA) 150 MG capsule Take 1 capsule (150 mg total) by mouth 3 (three) times daily. (Patient taking differently: Take 150 mg by mouth 2 (two) times daily. )  . triamterene-hydrochlorothiazide (MAXZIDE-25) 37.5-25 MG tablet take 1 tablet by mouth once daily   No current facility-administered medications on file prior to visit.    ROS  Constitutional: Denies any fever or chills Gastrointestinal: No reported hemesis, hematochezia, vomiting, or acute GI distress Musculoskeletal: Denies any acute onset joint swelling, redness, loss of ROM, or weakness Neurological: No reported episodes of acute onset apraxia, aphasia, dysarthria, agnosia, amnesia, paralysis, loss of coordination, or loss of consciousness  Allergies  Cheyenne Pierce is allergic to codeine; penicillins; prednisone; zoloft [sertraline hcl]; amoxicillin; codeine phosphate; lactose intolerance (gi); metronidazole; perindopril erbumine; potassium-containing compounds; prozac [fluoxetine hcl]; tramadol hcl; and oxcarbazepine.  PFSH  Drug: Cheyenne Pierce  reports that she does not use drugs. Alcohol:  reports that she does not drink alcohol. Tobacco:  reports that she has quit smoking. She has a 30.00 pack-year smoking history. She has never used smokeless  tobacco. Medical:  has a past medical history of Allergy; Angina pectoris ([place]); Anxiety; Cataract; Chronic back pain; DDD (degenerative disc disease), cervical; DDD (degenerative disc disease), cervical; DDD (degenerative disc disease), lumbar; Dental bridge present; Depression; Diverticulosis; GERD (gastroesophageal reflux disease); Headache; Heart murmur; History of bronchitis; History of colon polyps; History of gastric ulcer; History of hiatal hernia; Hypertension; Hypothyroidism; IBS (irritable bowel syndrome); Insomnia; Joint pain; Joint swelling; Mass of vagina; Nocturia; Peripheral neuropathy ([place]); Peripheral neuropathy ([place]); Urinary frequency; Urinary urgency; and Vertigo. Family: family history includes Heart disease in her mother; Stroke in her brother.  Past Surgical History:  Procedure Laterality Date  . ABDOMINAL HYSTERECTOMY  1968   left ovary remains  . ANTERIOR CERVICAL DECOMPRESSION/DISCECTOMY FUSION 4 LEVELS N/A 06/05/2015   Procedure: ANTERIOR CERVICAL DECOMPRESSION/DISCECTOMY FUSION CERVICAL THREE-FOUR,CERVICAL FOUR-FIVE,CERVICAL FIVE-SIX,CERVICAL SIX-SEVEN;  Surgeon: Earnie Larsson, MD;  Location: [facility];  Service: Neurosurgery;  Laterality: N/A;  right side approach  . APPENDECTOMY  1948  . bladder suspension with vaginal sling    . CARDIAC CATHETERIZATION  09/26/1998  . CATARACT EXTRACTION W/PHACO Left 10/15/2015   Procedure: CATARACT EXTRACTION PHACO AND INTRAOCULAR LENS PLACEMENT (IOC) LEFT EYE;  Surgeon: Ronnell Freshwater, MD;  Location: [place];  Service: Ophthalmology;  Laterality: Left;  LEFT LEAVE PT LAST  . CATARACT EXTRACTION W/PHACO Right 11/26/2015   Procedure: CATARACT EXTRACTION PHACO AND INTRAOCULAR LENS PLACEMENT (IOC);  Surgeon: Ronnell Freshwater, MD;  Location: [place];  Service: Ophthalmology;  Laterality: Right;  RIGHT  . COLONOSCOPY  07/2009   [facility]), normal per pt  . COLONOSCOPY  06/2014   int hem, rpt 5  yrs ([name])  . COLOSTOMY W/ RECTOCELE REPAIR  06/2004  . DEXA  2013   T -1.0  . epidural infections    . epidural steroid injections  multiple latest 05/2014, 07/2014   help periph neuropathy [name])  . ESOPHAGOGASTRODUODENOSCOPY    . EYE SURGERY Left 10/15/2015   cataract removal  . HERNIA REPAIR    . NM MYOVIEW LTD  2009   WNL per report  . OVARIAN CYST SURGERY  1963   cyst on ovaries  . TONSILLECTOMY    . US ECHOCARDIOGRAPHY  2009   mild aortic/mitral insuff, ER 54%, mild diastolic dysfunction  . VENTRAL HERNIA REPAIR  08/2008   abdominal wall, lysis of adhesions   Constitutional Exam  General appearance: Well nourished, well developed, and well hydrated. In no apparent acute distress Vitals:   07/01/16 1401  BP: 127/80  Pulse: 77  Resp: 16  Temp: 97.7 F (36.5 C)  TempSrc: Oral  SpO2: 98%  Weight: 135 lb (61.2 kg)  Height: 5' (1.524 m)   BMI Assessment: Estimated body mass index is 26.37 kg/m as calculated from the following:   Height as of this encounter: 5' (1.524 m).   Weight as of this encounter: 135 lb (61.2 kg).  BMI interpretation table: BMI level Category Range association with higher incidence of chronic pain  <18 kg/m2 Underweight   18.5-24.9 kg/m2 Ideal body weight   25-29.9 kg/m2 Overweight Increased incidence by 20%  30-34.9 kg/m2 Obese (Class I) Increased incidence by 68%  35-39.9 kg/m2 Severe obesity (Class II) Increased incidence by 136%  >40 kg/m2  Extreme obesity (Class III) Increased incidence by 254%   BMI Readings from Last 4 Encounters:  07/01/16 26.37 kg/m  06/05/16 27.78 kg/m  06/02/16 26.37 kg/m  05/27/16 26.56 kg/m   Wt Readings from Last 4 Encounters:  07/01/16 135 lb (61.2 kg)  06/05/16 142 lb 4 oz (64.5 kg)  06/02/16 135 lb (61.2 kg)  05/27/16 136 lb (61.7 kg)  Psych/Mental status: Alert, oriented x 3 (person, place, & time)       Eyes: PERLA Respiratory: No evidence of acute respiratory distress  Cervical Spine Exam   Inspection: No masses, redness, or swelling Alignment: Symmetrical Functional ROM: Minimal ROM Stability: No instability detected Muscle strength & Tone: Functionally intact Sensory: Movement-associated pain Palpation: Complains of area being tender to palpation  Upper Extremity (UE) Exam    Side: Right upper extremity  Side: Left upper extremity  Inspection: No masses, redness, swelling, or asymmetry. No contractures  Inspection: No masses, redness, swelling, or asymmetry. No contractures  Functional ROM: Unrestricted ROM          Functional ROM: Unrestricted ROM          Muscle strength & Tone: Functionally intact  Muscle strength & Tone: Functionally intact  Sensory: Unimpaired  Sensory: Unimpaired  Palpation: No palpable anomalies  Palpation: No palpable anomalies  Specialized Test(s): Deferred         Specialized Test(s): Deferred          Thoracic Spine Exam  Inspection: No masses, redness, or swelling Alignment: Symmetrical Functional ROM: Unrestricted ROM Stability: No instability detected Sensory: Unimpaired Muscle strength & Tone: No palpable anomalies  Lumbar Spine Exam  Inspection: No masses, redness, or swelling Alignment: Symmetrical Functional ROM: Unrestricted ROM Stability: No instability detected Muscle strength & Tone: Functionally intact Sensory: Unimpaired Palpation: No palpable anomalies Provocative Tests: Lumbar Hyperextension and rotation test: evaluation deferred today       Patrick's Maneuver: evaluation deferred today              Gait & Posture Assessment  Ambulation: Patient ambulates using a walker Gait: Very limited, using assistive device to ambulate Posture: WNL   Lower Extremity Exam    Side: Right lower extremity  Side: Left lower extremity  Inspection: No masses, redness, swelling, or asymmetry. No contractures  Inspection: No masses, redness, swelling, or asymmetry. No contractures  Functional ROM: Unrestricted ROM           Functional ROM: Unrestricted ROM          Muscle strength & Tone: Functionally intact  Muscle strength & Tone: Functionally intact  Sensory: Unimpaired  Sensory: Unimpaired  Palpation: No palpable anomalies  Palpation: No palpable anomalies   Assessment  Primary Diagnosis & Pertinent Problem List: The primary encounter diagnosis was Cervical facet syndrome (Right). Diagnoses of Cervical facet hypertrophy (Bilateral), Spondylosis of cervical region without myelopathy or radiculopathy, Chronic neck pain (Location of Primary Source of Pain) (Bilateral) (R>L), Gastroesophageal reflux disease without esophagitis, Irritable bowel syndrome with both constipation and diarrhea, Reflux esophagitis, and Primary osteoarthritis involving multiple joints were also pertinent to this visit.  Status Diagnosis  Controlled Controlled Controlled 1. Cervical facet syndrome (Right)   2. Cervical facet hypertrophy (Bilateral)   3. Spondylosis of cervical region without myelopathy or radiculopathy   4. Chronic neck pain (Location of Primary Source of Pain) (Bilateral) (R>L)   5. Gastroesophageal reflux disease without esophagitis   6. Irritable bowel syndrome with both constipation and diarrhea   7. Reflux esophagitis  8. Primary osteoarthritis involving multiple joints      Plan of Care  Pharmacotherapy (Medications Ordered): Meds ordered this encounter  Medications  . esomeprazole (NEXIUM) 40 MG capsule    Sig: Take 1 capsule (40 mg total) by mouth at bedtime. Take while on ibuprofen.    Dispense:  90 capsule    Refill:  0    Do not place medication on "Automatic Refill". Fill one day early if pharmacy is closed on scheduled refill date.  . meloxicam (MOBIC) 15 MG tablet    Sig: Take 1 tablet (15 mg total) by mouth daily.    Dispense:  90 tablet    Refill:  0    Do not place medication on "Automatic Refill". Fill one day early if pharmacy is closed on scheduled refill date.   New Prescriptions   No  medications on file   Medications administered today: Cheyenne Pierce had no medications administered during this visit. Lab-work, procedure(s), and/or referral(s): Orders Placed This Encounter  Procedures  . Radiofrequency,Cervical  . CERVICAL FACET (MEDIAL BRANCH NERVE BLOCK)    Imaging and/or referral(s): None  Interventional therapies: Planned, scheduled, and/or pending:   Right Cervical Facet RFA Palliative Right Cervical Facet Block #3   Considering:   Right cervical facet block Palliative right cervical epidural steroid injections Palliative left L4-5 lumbar epidural steroid injection   Palliative PRN treatment(s):   Palliative right cervical epidural steroid injections Palliative left L4-5 lumbar epidural steroid injection   Provider-requested follow-up: Return for keep scheduled Med-Mgmt appointment, procedure ([facility]).  Future Appointments Date Time Provider [place]  07/02/2016 10:00 AM Milinda Pointer, MD [facility] None  08/21/2016 1:15 PM Milinda Pointer, MD [facility] None  10/30/2016 11:15 AM Eustace Pen, LPN [facility]  11/06/2016 11:30 AM Ria Bush, MD [facility]   Primary Care Physician: Ria Bush, MD Location: [facility] Note by: Kathlen Brunswick. [name], M.D, DABA, DABAPM, DABPM, DABIPP, FIPP Date: 07/01/2016; Time: 4:49 PM  Pain Score Disclaimer: We use the NRS-11 scale. This is a self-reported, subjective measurement of pain severity with only modest accuracy. It is used primarily to identify changes within a particular patient. It must be understood that outpatient pain scales are significantly less accurate that those used for research, where they can be applied under ideal controlled circumstances with minimal exposure to variables. In reality, the score is likely to be a combination of pain intensity and pain affect, where pain affect describes the degree of emotional arousal or  changes in action readiness caused by the sensory experience of pain. Factors such as social and work situation, setting, emotional state, anxiety levels, expectation, and prior pain experience may influence pain perception and show large inter-individual differences that may also be affected by time variables.  Patient instructions provided during this appointment: Patient Instructions  Radiofrequency Lesioning Radiofrequency lesioning is a procedure that is performed to relieve pain. The procedure is often used for back, neck, or arm pain. Radiofrequency lesioning involves the use of a machine that creates radio waves to make heat. During the procedure, the heat is applied to the nerve that carries the pain signal. The heat damages the nerve and interferes with the pain signal. Pain relief usually starts about 2 weeks after the procedure and lasts for 6 months to 1 year. Tell a health care provider about:  Any allergies you have.  All medicines you are taking, including vitamins, herbs, eye drops, creams, and over-the-counter medicines.  Any problems you or  family members have had with anesthetic medicines.  Any blood disorders you have.  Any surgeries you have had.  Any medical conditions you have.  Whether you are pregnant or may be pregnant. What are the risks? Generally, this is a safe procedure. However, problems may occur, including:  Pain or soreness at the injection site.  Infection at the injection site.  Damage to nerves or blood vessels. What happens before the procedure?  Ask your health care provider about:  Changing or stopping your regular medicines. This is especially important if you are taking diabetes medicines or blood thinners.  Taking medicines such as aspirin and ibuprofen. These medicines can thin your blood. Do not take these medicines before your procedure if your health care provider instructs you not to.  Follow instructions from your health care  provider about eating or drinking restrictions.  Plan to have someone take you home after the procedure.  If you go home right after the procedure, plan to have someone with you for 24 hours. What happens during the procedure?  You will be given one or more of the following:  A medicine to help you relax (sedative).  A medicine to numb the area (local anesthetic).  You will be awake during the procedure. You will need to be able to talk with the health care provider during the procedure.  With the help of a type of X-ray (fluoroscopy), the health care provider will insert a radiofrequency needle into the area to be treated.  Next, a wire that carries the radio waves (electrode) will be put through the radiofrequency needle. An electrical pulse will be sent through the electrode to verify the correct nerve. You will feel a tingling sensation, and you may have muscle twitching.  Then, the tissue that is around the needle tip will be heated by an electric current that is passed using the radiofrequency machine. This will numb the nerves.  A bandage (dressing) will be put on the insertion area after the procedure is done. The procedure may vary among health care providers and hospitals. What happens after the procedure?  Your blood pressure, heart rate, breathing rate, and blood oxygen level will be monitored often until the medicines you were given have worn off.  Return to your normal activities as directed by your health care provider. This information is not intended to replace advice given to you by your health care provider. Make sure you discuss any questions you have with your health care provider. Document Released: 11/20/2010 Document Revised: 08/30/2015 Document Reviewed: 05/01/2014 Elsevier Interactive Patient Education  2017 [employer].

## 2016-07-02 ENCOUNTER — Encounter: Payer: Self-pay | Admitting: Pain Medicine

## 2016-07-02 ENCOUNTER — Ambulatory Visit (HOSPITAL_BASED_OUTPATIENT_CLINIC_OR_DEPARTMENT_OTHER): Payer: Medicare Other | Admitting: Pain Medicine

## 2016-07-02 ENCOUNTER — Ambulatory Visit
Admission: RE | Admit: 2016-07-02 | Discharge: 2016-07-02 | Disposition: A | Discharge status: Home / Self Care | Payer: Medicare Other | Source: Ambulatory Visit | Attending: Pain Medicine | Admitting: Pain Medicine

## 2016-07-02 VITALS — BP 126/60 | HR 67 | Temp 97.7°F | Resp 16 | Ht 60.0 in | Wt 135.0 lb

## 2016-07-02 DIAGNOSIS — M1288 Other specific arthropathies, not elsewhere classified, other specified site: Secondary | ICD-10-CM | POA: Diagnosis not present

## 2016-07-02 DIAGNOSIS — Z90710 Acquired absence of both cervix and uterus: Secondary | ICD-10-CM | POA: Insufficient documentation

## 2016-07-02 DIAGNOSIS — Z79899 Other long term (current) drug therapy: Secondary | ICD-10-CM | POA: Insufficient documentation

## 2016-07-02 DIAGNOSIS — Z885 Allergy status to narcotic agent status: Secondary | ICD-10-CM | POA: Insufficient documentation

## 2016-07-02 DIAGNOSIS — Z888 Allergy status to other drugs, medicaments and biological substances status: Secondary | ICD-10-CM | POA: Insufficient documentation

## 2016-07-02 DIAGNOSIS — M47812 Spondylosis without myelopathy or radiculopathy, cervical region: Secondary | ICD-10-CM

## 2016-07-02 DIAGNOSIS — M47892 Other spondylosis, cervical region: Secondary | ICD-10-CM | POA: Diagnosis present

## 2016-07-02 DIAGNOSIS — M542 Cervicalgia: Secondary | ICD-10-CM

## 2016-07-02 DIAGNOSIS — Z880 Allergy status to penicillin: Secondary | ICD-10-CM | POA: Insufficient documentation

## 2016-07-02 DIAGNOSIS — G8929 Other chronic pain: Secondary | ICD-10-CM

## 2016-07-02 DIAGNOSIS — Z9889 Other specified postprocedural states: Secondary | ICD-10-CM | POA: Diagnosis not present

## 2016-07-02 MED ORDER — MIDAZOLAM HCL 5 MG/5ML IJ SOLN
1.0000 mg | INTRAMUSCULAR | Status: DC | PRN
  Administered 2016-07-02: 2 mg via INTRAVENOUS
  Filled 2016-07-02: qty 5

## 2016-07-02 MED ORDER — ROPIVACAINE HCL 5 MG/ML IJ SOLN
5.0000 mL | Freq: Once | INTRAMUSCULAR | Status: AC
  Administered 2016-07-02: 5 mL via PERINEURAL
  Filled 2016-07-02: qty 20

## 2016-07-02 MED ORDER — LACTATED RINGERS IV SOLN
1000.0000 mL | Freq: Once | INTRAVENOUS | Status: AC
  Administered 2016-07-02: 1000 mL via INTRAVENOUS

## 2016-07-02 MED ORDER — LIDOCAINE HCL (PF) 1 % IJ SOLN
10.0000 mL | Freq: Once | INTRAMUSCULAR | Status: DC
Start: 2016-07-02 — End: 2016-07-02

## 2016-07-02 MED ORDER — DEXAMETHASONE SODIUM PHOSPHATE 4 MG/ML IJ SOLN
10.0000 mg | Freq: Once | INTRAMUSCULAR | Status: AC
  Administered 2016-07-02: 4 mg
  Filled 2016-07-02: qty 3

## 2016-07-02 MED ORDER — FENTANYL CITRATE (PF) 100 MCG/2ML IJ SOLN
25.0000 ug | INTRAMUSCULAR | Status: DC | PRN
  Administered 2016-07-02: 50 ug via INTRAVENOUS
  Filled 2016-07-02: qty 2

## 2016-07-02 MED ORDER — DEXAMETHASONE SODIUM PHOSPHATE 4 MG/ML IJ SOLN
INTRAMUSCULAR | Status: AC
Start: 2016-07-02 — End: 2016-07-02
  Filled 2016-07-02: qty 2

## 2016-07-02 NOTE — Progress Notes (Signed)
Safety precautions to be maintained throughout the outpatient stay will include: orient to surroundings, keep bed in low position, maintain call bell within reach at all times, provide assistance with transfer out of bed and ambulation.  

## 2016-07-02 NOTE — Patient Instructions (Addendum)
Post-Procedure instructions Instructions:  Apply ice: Fill a plastic sandwich bag with crushed ice. Cover it with a small towel and apply to injection site. Apply for 15 minutes then remove x 15 minutes. Repeat sequence on day of procedure, until you go to bed. The purpose is to minimize swelling and discomfort after procedure.  Apply heat: Apply heat to procedure site starting the day following the procedure. The purpose is to treat any soreness and discomfort from the procedure.  Food intake: Start with clear liquids (like water) and advance to regular food, as tolerated.   Physical activities: Keep activities to a minimum for the first 8 hours after the procedure.   Driving: If you have received any sedation, you are not allowed to drive for 24 hours after your procedure.  Blood thinner: Restart your blood thinner 6 hours after your procedure. (Only for those taking blood thinners)  Insulin: As soon as you can eat, you may resume your normal dosing schedule. (Only for those taking insulin)  Infection prevention: Keep procedure site clean and dry.  Post-procedure Pain Diary: Extremely important that this be done correctly and accurately. Recorded information will be used to determine the next step in treatment.  Pain evaluated is that of treated area only. Do not include pain from an untreated area.  Complete every hour, on the hour, for the initial 8 hours. Set an alarm to help you do this part accurately.  Do not go to sleep and have it completed later. It will not be accurate.  Follow-up appointment: Keep your follow-up appointment after the procedure. Usually 2 weeks for most procedures. (6 weeks in the case of radiofrequency.) Bring you pain diary.  Expect:  From numbing medicine (AKA: Local Anesthetics): Numbness or decrease in pain.  Onset: Full effect within 15 minutes of injected.  Duration: It will depend on the type of local anesthetic used. On the average, 1 to 8  hours.   From steroids: Decrease in swelling or inflammation. Once inflammation is improved, relief of the pain will follow.  Onset of benefits: Depends on the amount of swelling present. The more swelling, the longer it will take for the benefits to be seen.   Duration: Steroids will stay in the system x 2 weeks. Duration of benefits will depend on multiple posibilities including persistent irritating factors.  From procedure: Some discomfort is to be expected once the numbing medicine wears off. This should be minimal if ice and heat are applied as instructed. Call if:  You experience numbness and weakness that gets worse with time, as opposed to wearing off.  New onset bowel or bladder incontinence. (Spinal procedures only)  Emergency Numbers:  Durning business hours (Monday - Thursday, 8:00 AM - 4:00 PM) (Friday, 9:00 AM - 12:00 Noon): [phone]  After hours: [phone]   __________________________________________________________________________________________   Pain Management Discharge Instructions  General Discharge Instructions :  If you need to reach your doctor call: Monday-Friday 8:00 am - 4:00 pm at [phone] or toll free [phone].  After clinic hours [phone] to have operator reach doctor.  Bring all of your medication bottles to all your appointments in the pain clinic.  To cancel or reschedule your appointment with Pain Management please remember to call 24 hours in advance to avoid a fee.  Refer to the educational materials which you have been given on: General Risks, I had my Procedure. Discharge Instructions, Post Sedation.  Post Procedure Instructions:  The drugs you were given will stay in   your system until tomorrow, so for the next 24 hours you should not drive, make any legal decisions or drink any alcoholic beverages.  You may eat anything you prefer, but it is better to start with liquids then soups and crackers, and gradually  work up to solid foods.  Please notify your doctor immediately if you have any unusual bleeding, trouble breathing or pain that is not related to your normal pain.  Depending on the type of procedure that was done, some parts of your body may feel week and/or numb.  This usually clears up by tonight or the next day.  Walk with the use of an assistive device or accompanied by an adult for the 24 hours.  You may use ice on the affected area for the first 24 hours.  Put ice in a Ziploc bag and cover with a towel and place against area 15 minutes on 15 minutes off.  You may switch to heat after 24 hours.Pain Management Discharge Instructions  General Discharge Instructions :  If you need to reach your doctor call: Monday-Friday 8:00 am - 4:00 pm at [phone] or toll free [phone].  After clinic hours [phone] to have operator reach doctor.  Bring all of your medication bottles to all your appointments in the pain clinic.  To cancel or reschedule your appointment with Pain Management please remember to call 24 hours in advance to avoid a fee.  Refer to the educational materials which you have been given on: General Risks, I had my Procedure. Discharge Instructions, Post Sedation.  Post Procedure Instructions:  The drugs you were given will stay in your system until tomorrow, so for the next 24 hours you should not drive, make any legal decisions or drink any alcoholic beverages.  You may eat anything you prefer, but it is better to start with liquids then soups and crackers, and gradually work up to solid foods.  Please notify your doctor immediately if you have any unusual bleeding, trouble breathing or pain that is not related to your normal pain.  Depending on the type of procedure that was done, some parts of your body may feel week and/or numb.  This usually clears up by tonight or the next day.  Walk with the use of an assistive device or accompanied by an adult for the 24  hours.  You may use ice on the affected area for the first 24 hours.  Put ice in a Ziploc bag and cover with a towel and place against area 15 minutes on 15 minutes off.  You may switch to heat after 24 hours.

## 2016-07-02 NOTE — Progress Notes (Signed)
Patient's Name: Cheyenne Pierce  MRN: [id number]  Referring Provider: Ria Bush, MD  DOB: May 02, 1934  PCP: Ria Bush, MD  DOS: 07/02/2016  Note by: Kathlen Brunswick. [name], MD  Service setting: Ambulatory outpatient  Location: [facility] (AMB) Pain Management Facility  Visit type: Procedure  Specialty: Interventional Pain Management  Patient type: Established   Primary Reason for Visit: Interventional Pain Management Treatment. CC: Neck Pain  Procedure:  Anesthesia, Analgesia, Anxiolysis:  Type: Diagnostic Cervical Facet Medial Branch Block(s) Region: Posterolateral cervical spine region Level: C3, C4, C5, C6, & C7 Medial Branch Level(s) Laterality: Right-Sided Paraspinal  Type: Local Anesthesia with Moderate (Conscious) Sedation Local Anesthetic: Lidocaine 1% Route: Intravenous (IV) IV Access: Secured Sedation: Meaningful verbal contact was maintained at all times during the procedure  Indication(s): Analgesia and Anxiety  Indications: 1. Cervical facet syndrome (Right)   2. Cervical facet hypertrophy (Bilateral)   3. Other osteoarthritis of spine, cervical region   4. Chronic neck pain (Location of Primary Source of Pain) (Bilateral) (R>L)    Pain Score: Pre-procedure: 4 /10 Post-procedure: 0-No pain/10  Pre-op Assessment:  Previous date of service: 07/01/16 Service provided: Evaluation Ms. Shands is a 82 y.o. (year old), female patient, seen today for interventional treatment. She  has a past surgical history that includes Appendectomy (1948); Abdominal hysterectomy (1968); Ovarian cyst surgery (1963); Hernia repair; Colostomy w/ rectocele repair (06/2004); Ventral hernia repair (08/2008); Colonoscopy (07/2009); epidural steroid injections (multiple latest 05/2014, 07/2014); US ECHOCARDIOGRAPHY (2009); NM MYOVIEW LTD (2009); DEXA (2013); Colonoscopy (06/2014); Tonsillectomy; Cardiac catheterization (09/26/1998); bladder suspension with vaginal sling; epidural infections;  Esophagogastroduodenoscopy; Anterior cervical decompression/discectomy fusion 4 level (N/A, 06/05/2015); Cataract extraction w/PHACO (Left, 10/15/2015); Eye surgery (Left, 10/15/2015); and Cataract extraction w/PHACO (Right, 11/26/2015). Her primarily concern today is the Neck Pain  Initial Vital Signs: Blood pressure (!) 114/96, pulse 67, temperature 97.7 F (36.5 C), temperature source Oral, resp. rate 16, height 5' (1.524 m), weight 135 lb (61.2 kg), SpO2 99 %. BMI: 26.37 kg/m  Risk Assessment: Allergies: Reviewed. She is allergic to codeine; penicillins; prednisone; zoloft [sertraline hcl]; amoxicillin; codeine phosphate; lactose intolerance (gi); metronidazole; perindopril erbumine; potassium-containing compounds; prozac [fluoxetine hcl]; tramadol hcl; and oxcarbazepine.  Allergy Precautions: None required Coagulopathies: "Reviewed. None identified.  Blood-thinner therapy: None at this time Active Infection(s): Reviewed. None identified. Ms. Kinnick is afebrile  Site Confirmation: Ms. Scahill was asked to confirm the procedure and laterality before marking the site Procedure checklist: Completed Consent: Before the procedure and under the influence of no sedative(s), amnesic(s), or anxiolytics, the patient was informed of the treatment options, risks and possible complications. To fulfill our ethical and legal obligations, as recommended by the American Medical Association's Code of Ethics, I have informed the patient of my clinical impression; the nature and purpose of the treatment or procedure; the risks, benefits, and possible complications of the intervention; the alternatives, including doing nothing; the risk(s) and benefit(s) of the alternative treatment(s) or procedure(s); and the risk(s) and benefit(s) of doing nothing. The patient was provided information about the general risks and possible complications associated with the procedure. These may include, but are not limited to: failure to  achieve desired goals, infection, bleeding, organ or nerve damage, allergic reactions, paralysis, and death. In addition, the patient was informed of those risks and complications associated to Spine-related procedures, such as failure to decrease pain; infection (i.e.: Meningitis, epidural or intraspinal abscess); bleeding (i.e.: epidural hematoma, subarachnoid hemorrhage, or any other type of intraspinal or peri-dural bleeding); organ or nerve damage (  i.e.: Any type of peripheral nerve, nerve root, or spinal cord injury) with subsequent damage to sensory, motor, and/or autonomic systems, resulting in permanent pain, numbness, and/or weakness of one or several areas of the body; allergic reactions; (i.e.: anaphylactic reaction); and/or death. Furthermore, the patient was informed of those risks and complications associated with the medications. These include, but are not limited to: allergic reactions (i.e.: anaphylactic or anaphylactoid reaction(s)); adrenal axis suppression; blood sugar elevation that in diabetics may result in ketoacidosis or comma; water retention that in patients with history of congestive heart failure may result in shortness of breath, pulmonary edema, and decompensation with resultant heart failure; weight gain; swelling or edema; medication-induced neural toxicity; particulate matter embolism and blood vessel occlusion with resultant organ, and/or nervous system infarction; and/or aseptic necrosis of one or more joints. Finally, the patient was informed that Medicine is not an exact science; therefore, there is also the possibility of unforeseen or unpredictable risks and/or possible complications that may result in a catastrophic outcome. The patient indicated having understood very clearly. We have given the patient no guarantees and we have made no promises. Enough time was given to the patient to ask questions, all of which were answered to the patient's satisfaction. Ms. Richoux has  indicated that she wanted to continue with the procedure. Attestation: I, the ordering provider, attest that I have discussed with the patient the benefits, risks, side-effects, alternatives, likelihood of achieving goals, and potential problems during recovery for the procedure that I have provided informed consent. Date: 07/02/2016; Time: 10:07 AM  Pre-Procedure Preparation:  Monitoring: As per clinic protocol. Respiration, ETCO2, SpO2, BP, heart rate and rhythm monitor placed and checked for adequate function Safety Precautions: Patient was assessed for positional comfort and pressure points before starting the procedure. Time-out: I initiated and conducted the "Time-out" before starting the procedure, as per protocol. The patient was asked to participate by confirming the accuracy of the "Time Out" information. Verification of the correct person, site, and procedure were performed and confirmed by me, the nursing staff, and the patient. "Time-out" conducted as per Joint Commission's Universal Protocol (UP.01.01.01). "Time-out" Date & Time: 07/02/2016; 1053 hrs.  Description of Procedure Process:   Position: Prone with head of the table was raised to facilitate breathing. Target Area: For Cervical Facet blocks, the target is the postero-lateral waist of the articular pillars at the C3, C4, C5, C6, & C7 levels. Approach: Posterior approach. Area Prepped: Entire Posterior Cervico-thoracic Region Prepping solution: ChloraPrep (2% chlorhexidine gluconate and 70% isopropyl alcohol) Safety Precautions: Aspiration looking for blood return was conducted prior to all injections. At no point did we inject any substances, as a needle was being advanced. No attempts were made at seeking any paresthesias. Safe injection practices and needle disposal techniques used. Medications properly checked for expiration dates. SDV (single dose vial) medications used. Description of the Procedure: Protocol guidelines were  followed. The patient was placed in position over the fluoroscopy table. The target area was identified and the area prepped in the usual manner. Skin desensitized using vapocoolant spray. Skin & deeper tissues infiltrated with local anesthetic. Appropriate amount of time allowed to pass for local anesthetics to take effect. The procedure needle was introduced through the skin, ipsilateral to the reported pain, and advanced to the target area. Bone was contacted on the posterior aspect of the articular pillars and the needle walked lateral, until the border was cleared. Lateral views taken to make sure the needle tip did not advance past  the posterior third of the lateral mass of the posterior columns. The procedure was repeated in identical fashion for each level. Negative aspiration confirmed. Solution injected in intermittent fashion, asking for systemic symptoms every 0.5cc of injectate. The needles were then removed and the area cleansed, making sure to leave some of the prepping solution back to take advantage of its long term bactericidal properties. Vitals:   07/02/16 1102 07/02/16 1111 07/02/16 1122 07/02/16 1131  BP: 124/76 123/73 127/62 126/60  Pulse:      Resp: 16 16 18 16   Temp:      TempSrc:      SpO2: 90% 98% 97% 97%  Weight:      Height:        Start Time: 1054 hrs. End Time: 1101 hrs. Materials:  Needle(s) Type: Regular needle Gauge: 22G Length: 3.5-in Medication(s): We administered fentaNYL, lactated ringers, midazolam, dexamethasone, and ropivacaine (PF) 5 mg/mL (0.5%). Please see chart orders for dosing details.  Imaging Guidance (Spinal):  Type of Imaging Technique: Fluoroscopy Guidance (Spinal) Indication(s): Assistance in needle guidance and placement for procedures requiring needle placement in or near specific anatomical locations not easily accessible without such assistance. Exposure Time: Please see nurses notes. Contrast: None used. Fluoroscopic Guidance: I was  personally present during the use of fluoroscopy. "Tunnel Vision Technique" used to obtain the best possible view of the target area. Parallax error corrected before commencing the procedure. "Direction-depth-direction" technique used to introduce the needle under continuous pulsed fluoroscopy. Once target was reached, antero-posterior, oblique, and lateral fluoroscopic projection used confirm needle placement in all planes. Images permanently stored in EMR. Interpretation: No contrast injected. I personally interpreted the imaging intraoperatively. Adequate needle placement confirmed in multiple planes. Permanent images saved into the patient's record.  Antibiotic Prophylaxis:  Indication(s): None identified Antibiotic given: None  Post-operative Assessment:  EBL: None Complications: No immediate post-treatment complications observed by team, or reported by patient. Note: The patient tolerated the entire procedure well. A repeat set of vitals were taken after the procedure and the patient was kept under observation following institutional policy, for this type of procedure. Post-procedural neurological assessment was performed, showing return to baseline, prior to discharge. The patient was provided with post-procedure discharge instructions, including a section on how to identify potential problems. Should any problems arise concerning this procedure, the patient was given instructions to immediately contact us, at any time, without hesitation. In any case, we plan to contact the patient by telephone for a follow-up status report regarding this interventional procedure. Comments:  No additional relevant information.  Plan of Care  Disposition: Discharge home  Discharge Date & Time: 07/02/2016; 1133 hrs.  Physician-requested Follow-up:  Return in about 2 weeks (around 07/16/2016) for Post-Procedure evaluation.  Future Appointments Date Time Provider [place]  07/30/2016 1:45 PM Milinda Pointer, MD [facility] None  08/21/2016 1:15 PM Milinda Pointer, MD [facility] None  10/30/2016 11:15 AM Eustace Pen, LPN [facility]  11/06/2016 11:30 AM Ria Bush, MD [facility]   Medications ordered for procedure: Meds ordered this encounter  Medications  . fentaNYL (SUBLIMAZE) injection 25-50 mcg    Make sure Narcan is available in the pyxis when using this medication. In the event of respiratory depression (RR< 8/min): Titrate NARCAN (naloxone) in increments of 0.1 to 0.2 mg IV at 2-3 minute intervals, until desired degree of reversal.  . lactated ringers infusion 1,000 mL  . midazolam (VERSED) 5 MG/5ML injection 1-2 mg    Make sure Flumazenil is available  in the pyxis when using this medication. If oversedation occurs, administer 0.2 mg IV over 15 sec. If after 45 sec no response, administer 0.2 mg again over 1 min; may repeat at 1 min intervals; not to exceed 4 doses (1 mg)  . dexamethasone (DECADRON) injection 10 mg  . lidocaine (PF) (XYLOCAINE) 1 % injection 10 mL  . ropivacaine (PF) 5 mg/mL (0.5%) (NAROPIN) injection 5 mL    Preservative-free (MPF), single use vial.   Medications administered: We administered fentaNYL, lactated ringers, midazolam, dexamethasone, and ropivacaine (PF) 5 mg/mL (0.5%).  See the medical record for exact dosing, route, and time of administration.  Lab-work, Procedure(s), & Referral(s) Ordered: Orders Placed This Encounter  Procedures  . DG C-Arm 1-60 Min-No Report  . Discharge instructions  . Follow-up  . Informed Consent Details: Transcribe to consent form and obtain patient signature  . Provider attestation of informed consent for procedure/surgical case  . Verify informed consent   Imaging Ordered: Results for orders placed in visit on 06/02/16  DG C-Arm 1-60 Min-No Report   Narrative Fluoroscopy was utilized by the requesting physician.  No radiographic  interpretation.    New Prescriptions   No medications  on file   Primary Care Physician: Ria Bush, MD Location: [facility] Note by: Kathlen Brunswick. [name], M.D, DABA, DABAPM, DABPM, DABIPP, FIPP Date: 07/02/2016; Time: 1:30 PM  Disclaimer:  Medicine is not an Chief Strategy Officer. The only guarantee in medicine is that nothing is guaranteed. It is important to note that the decision to proceed with this intervention was based on the information collected from the patient. The Data and conclusions were drawn from the patient's questionnaire, the interview, and the physical examination. Because the information was provided in large part by the patient, it cannot be guaranteed that it has not been purposely or unconsciously manipulated. Every effort has been made to obtain as much relevant data as possible for this evaluation. It is important to note that the conclusions that lead to this procedure are derived in large part from the available data. Always take into account that the treatment will also be dependent on availability of resources and existing treatment guidelines, considered by other Pain Management Practitioners as being common knowledge and practice, at the time of the intervention. For Medico-Legal purposes, it is also important to point out that variation in procedural techniques and pharmacological choices are the acceptable norm. The indications, contraindications, technique, and results of the above procedure should only be interpreted and judged by a Board-Certified Interventional Pain Specialist with extensive familiarity and expertise in the same exact procedure and technique. Attempts at providing opinions without similar or greater experience and expertise than that of the treating physician will be considered as inappropriate and unethical, and shall result in a formal complaint to the state medical board and applicable specialty societies.  Instructions provided at this appointment: Patient Instructions   Post-Procedure instructions Instructions:  Apply ice: Fill a plastic sandwich bag with crushed ice. Cover it with a small towel and apply to injection site. Apply for 15 minutes then remove x 15 minutes. Repeat sequence on day of procedure, until you go to bed. The purpose is to minimize swelling and discomfort after procedure.  Apply heat: Apply heat to procedure site starting the day following the procedure. The purpose is to treat any soreness and discomfort from the procedure.  Food intake: Start with clear liquids (like water) and advance to regular food, as tolerated.   Physical  activities: Keep activities to a minimum for the first 8 hours after the procedure.   Driving: If you have received any sedation, you are not allowed to drive for 24 hours after your procedure.  Blood thinner: Restart your blood thinner 6 hours after your procedure. (Only for those taking blood thinners)  Insulin: As soon as you can eat, you may resume your normal dosing schedule. (Only for those taking insulin)  Infection prevention: Keep procedure site clean and dry.  Post-procedure Pain Diary: Extremely important that this be done correctly and accurately. Recorded information will be used to determine the next step in treatment.  Pain evaluated is that of treated area only. Do not include pain from an untreated area.  Complete every hour, on the hour, for the initial 8 hours. Set an alarm to help you do this part accurately.  Do not go to sleep and have it completed later. It will not be accurate.  Follow-up appointment: Keep your follow-up appointment after the procedure. Usually 2 weeks for most procedures. (6 weeks in the case of radiofrequency.) Bring you pain diary.  Expect:  From numbing medicine (AKA: Local Anesthetics): Numbness or decrease in pain.  Onset: Full effect within 15 minutes of injected.  Duration: It will depend on the type of local anesthetic used. On the average, 1 to 8  hours.   From steroids: Decrease in swelling or inflammation. Once inflammation is improved, relief of the pain will follow.  Onset of benefits: Depends on the amount of swelling present. The more swelling, the longer it will take for the benefits to be seen.   Duration: Steroids will stay in the system x 2 weeks. Duration of benefits will depend on multiple posibilities including persistent irritating factors.  From procedure: Some discomfort is to be expected once the numbing medicine wears off. This should be minimal if ice and heat are applied as instructed. Call if:  You experience numbness and weakness that gets worse with time, as opposed to wearing off.  New onset bowel or bladder incontinence. (Spinal procedures only)  Emergency Numbers:  Durning business hours (Monday - Thursday, 8:00 AM - 4:00 PM) (Friday, 9:00 AM - 12:00 Noon): [phone]  After hours: [phone]   __________________________________________________________________________________________   Pain Management Discharge Instructions  General Discharge Instructions :  If you need to reach your doctor call: Monday-Friday 8:00 am - 4:00 pm at [phone] or toll free [phone].  After clinic hours [phone] to have operator reach doctor.  Bring all of your medication bottles to all your appointments in the pain clinic.  To cancel or reschedule your appointment with Pain Management please remember to call 24 hours in advance to avoid a fee.  Refer to the educational materials which you have been given on: General Risks, I had my Procedure. Discharge Instructions, Post Sedation.  Post Procedure Instructions:  The drugs you were given will stay in your system until tomorrow, so for the next 24 hours you should not drive, make any legal decisions or drink any alcoholic beverages.  You may eat anything you prefer, but it is better to start with liquids then soups and crackers, and gradually  work up to solid foods.  Please notify your doctor immediately if you have any unusual bleeding, trouble breathing or pain that is not related to your normal pain.  Depending on the type of procedure that was done, some parts of your body may feel week and/or numb.  This usually clears up by  tonight or the next day.  Walk with the use of an assistive device or accompanied by an adult for the 24 hours.  You may use ice on the affected area for the first 24 hours.  Put ice in a Ziploc bag and cover with a towel and place against area 15 minutes on 15 minutes off.  You may switch to heat after 24 hours.Pain Management Discharge Instructions  General Discharge Instructions :  If you need to reach your doctor call: Monday-Friday 8:00 am - 4:00 pm at [phone] or toll free [phone].  After clinic hours [phone] to have operator reach doctor.  Bring all of your medication bottles to all your appointments in the pain clinic.  To cancel or reschedule your appointment with Pain Management please remember to call 24 hours in advance to avoid a fee.  Refer to the educational materials which you have been given on: General Risks, I had my Procedure. Discharge Instructions, Post Sedation.  Post Procedure Instructions:  The drugs you were given will stay in your system until tomorrow, so for the next 24 hours you should not drive, make any legal decisions or drink any alcoholic beverages.  You may eat anything you prefer, but it is better to start with liquids then soups and crackers, and gradually work up to solid foods.  Please notify your doctor immediately if you have any unusual bleeding, trouble breathing or pain that is not related to your normal pain.  Depending on the type of procedure that was done, some parts of your body may feel week and/or numb.  This usually clears up by tonight or the next day.  Walk with the use of an assistive device or accompanied by an adult for the 24  hours.  You may use ice on the affected area for the first 24 hours.  Put ice in a Ziploc bag and cover with a towel and place against area 15 minutes on 15 minutes off.  You may switch to heat after 24 hours.

## 2016-07-03 ENCOUNTER — Telehealth: Payer: Self-pay | Admitting: *Deleted

## 2016-07-03 NOTE — Telephone Encounter (Signed)
Spoke with patient re; procedure on yesterday, verbalizes no questions or concerns.  

## 2016-07-30 ENCOUNTER — Encounter: Payer: Self-pay | Admitting: Pain Medicine

## 2016-07-30 ENCOUNTER — Ambulatory Visit: Payer: Medicare Other | Attending: Pain Medicine | Admitting: Pain Medicine

## 2016-07-30 VITALS — BP 130/86 | HR 40 | Temp 98.0°F | Resp 16 | Ht 60.0 in | Wt 140.0 lb

## 2016-07-30 DIAGNOSIS — Z8719 Personal history of other diseases of the digestive system: Secondary | ICD-10-CM | POA: Diagnosis not present

## 2016-07-30 DIAGNOSIS — G8929 Other chronic pain: Secondary | ICD-10-CM | POA: Diagnosis not present

## 2016-07-30 DIAGNOSIS — Z87891 Personal history of nicotine dependence: Secondary | ICD-10-CM | POA: Diagnosis not present

## 2016-07-30 DIAGNOSIS — Z90710 Acquired absence of both cervix and uterus: Secondary | ICD-10-CM | POA: Diagnosis not present

## 2016-07-30 DIAGNOSIS — M797 Fibromyalgia: Secondary | ICD-10-CM

## 2016-07-30 DIAGNOSIS — M4802 Spinal stenosis, cervical region: Secondary | ICD-10-CM | POA: Diagnosis not present

## 2016-07-30 DIAGNOSIS — Z9889 Other specified postprocedural states: Secondary | ICD-10-CM | POA: Diagnosis not present

## 2016-07-30 DIAGNOSIS — M5412 Radiculopathy, cervical region: Secondary | ICD-10-CM | POA: Diagnosis not present

## 2016-07-30 DIAGNOSIS — Z86010 Personal history of colonic polyps: Secondary | ICD-10-CM | POA: Diagnosis not present

## 2016-07-30 DIAGNOSIS — Z8249 Family history of ischemic heart disease and other diseases of the circulatory system: Secondary | ICD-10-CM | POA: Insufficient documentation

## 2016-07-30 DIAGNOSIS — M4722 Other spondylosis with radiculopathy, cervical region: Secondary | ICD-10-CM

## 2016-07-30 DIAGNOSIS — Z823 Family history of stroke: Secondary | ICD-10-CM | POA: Diagnosis not present

## 2016-07-30 DIAGNOSIS — M9981 Other biomechanical lesions of cervical region: Secondary | ICD-10-CM

## 2016-07-30 DIAGNOSIS — I10 Essential (primary) hypertension: Secondary | ICD-10-CM | POA: Diagnosis not present

## 2016-07-30 DIAGNOSIS — M792 Neuralgia and neuritis, unspecified: Secondary | ICD-10-CM

## 2016-07-30 DIAGNOSIS — R937 Abnormal findings on diagnostic imaging of other parts of musculoskeletal system: Secondary | ICD-10-CM

## 2016-07-30 DIAGNOSIS — E039 Hypothyroidism, unspecified: Secondary | ICD-10-CM | POA: Diagnosis not present

## 2016-07-30 DIAGNOSIS — Q761 Klippel-Feil syndrome: Secondary | ICD-10-CM | POA: Diagnosis not present

## 2016-07-30 DIAGNOSIS — M4803 Spinal stenosis, cervicothoracic region: Secondary | ICD-10-CM | POA: Diagnosis not present

## 2016-07-30 DIAGNOSIS — M542 Cervicalgia: Secondary | ICD-10-CM | POA: Diagnosis not present

## 2016-07-30 MED ORDER — PREGABALIN 150 MG PO CAPS: 150.0000 mg | ORAL_CAPSULE | Freq: Three times a day (TID) | ORAL | 5 refills | Status: DC

## 2016-07-30 NOTE — Progress Notes (Signed)
Safety precautions to be maintained throughout the outpatient stay will include: orient to surroundings, keep bed in low position, maintain call bell within reach at all times, provide assistance with transfer out of bed and ambulation.  

## 2016-07-30 NOTE — Progress Notes (Signed)
Patient's Name: Cheyenne Pierce  MRN: [id number]  Referring Provider: Ria Bush, MD  DOB: [birth year]  PCP: Ria Bush, MD  DOS: 07/30/2016  Note by: Kathlen Brunswick. [name], MD  Service setting: Ambulatory outpatient  Specialty: Interventional Pain Management  Location: [facility] (AMB) Pain Management Facility    Patient type: Established   Primary Reason(s) for Visit: Encounter for post-procedure evaluation of chronic illness with mild to moderate exacerbation CC: Neck Pain  HPI  Cheyenne Pierce is a 82 y.o. year old, female patient, who comes today for a post-procedure evaluation. She has Hypothyroidism; GAD (generalized anxiety disorder); Hereditary and idiopathic peripheral neuropathy; Essential hypertension; Reflux esophagitis; GERD; INGUINAL HERNIA, LEFT; Irritable bowel syndrome; Other chronic cystitis; Osteopenia; Systolic murmur; Medicare annual wellness visit, subsequent; PVCs (premature ventricular contractions); Prediabetes; Memory deficit; Advanced care planning/counseling discussion; HLD (hyperlipidemia); Health maintenance examination; Recurrent falls; Night sweats; Chronic pain syndrome; Encounter for therapeutic drug level monitoring; Encounter for chronic pain management; Chronic midline low back pain with left-sided sciatica; Chronic neck pain (Location of Primary Source of Pain) (Bilateral) (R>L); Cervical spondylosis; Chronic cervical radicular pain (Bilateral) (R>L); Numbness of upper extremity (Bilateral) (R>L); Upper extremity weakness (Bilateral); Neurogenic pain; Neuropathic pain; Inflammatory pain; Chronic Lumbar Radicular pain (Location of Secondary source of pain) (S1) (Left); Allodynia; Encounter for long-term current use of medication; Lumbar spondylosis; Chronic Lumbar Radiculopathy (Left); Chronic lower extremity pain (Bilateral) (L>R); Chronic hip pain (Location of Tertiary source of pain) (Right); Abnormal Cervical MRI (01/24/15); Cervical spinal stenosis (C3 through C7);  Cervical facet hypertrophy (Bilateral); Cervical foraminal stenosis (C7-T1) (Bilateral); Chronic cervical radiculopathy (Bilateral) (R>L) (pain, numbness, and weakness); Abnormal Lumbar MRI (11/10/2014); Lumbar spinal stenosis (L3-4); Lumbar foraminal stenosis (Bilateral L3-4) (Right L4-5); MDD (major depressive disorder), recurrent episode, moderate ([place]); Fibromyalgia; Neck pain on right side; Cervical facet syndrome (Right); Cervical fusion syndrome (C3-C7 ACDF); and Primary osteoarthritis involving multiple joints on her problem list. Her primarily concern today is the Neck Pain  Pain Assessment: Self-Reported Pain Score: 1 /10             Reported level is compatible with observation.       Pain Type: Chronic pain Pain Location: Neck Pain Descriptors / Indicators: Dull Pain Frequency: Constant  Cheyenne Pierce comes in today for post-procedure evaluation after the treatment done on 07/02/2016.  Further details on both, my assessment(s), as well as the proposed treatment plan, please see below.  Post-Procedure Assessment  07/02/2016 Procedure: Diagnostic Right Cervical Facet Medial Branch Block(s) under fluoro Pre-procedure pain score:  4/10 Post-procedure pain score: 0/10 (100% relief) Influential Factors: BMI: 27.34 kg/m Intra-procedural challenges: None observed Assessment challenges: None detected         Post-procedural side-effects, adverse reactions, or complications: None reported Reported issues: None  Sedation: Sedation provided. When no sedatives are used, the analgesic levels obtained are directly associated to the effectiveness of the local anesthetics. However, when sedation is provided, the level of analgesia obtained during the initial 1 hour following the intervention, is believed to be the result of a combination of factors. These factors may include, but are not limited to: 1. The effectiveness of the local anesthetics used. 2. The effects of the analgesic(s) and/or  anxiolytic(s) used. 3. The degree of discomfort experienced by the patient at the time of the procedure. 4. The patients ability and reliability in recalling and recording the events. 5. The presence and influence of possible secondary gains and/or psychosocial factors. Reported result: Relief experienced during the 1st hour after the procedure:  100 % (Ultra-Short Term Relief) Interpretative annotation: Analgesia during this period is likely to be Local Anesthetic and/or IV Sedative (Analgesic/Anxiolitic) related.          Effects of local anesthetic: The analgesic effects attained during this period are directly associated to the localized infiltration of local anesthetics and therefore cary significant diagnostic value as to the etiological location, or anatomical origin, of the pain. Expected duration of relief is directly dependent on the pharmacodynamics of the local anesthetic used. Long-acting (4-6 hours) anesthetics used.  Reported result: Relief during the next 4 to 6 hour after the procedure: 90 % (Short-Term Relief) Interpretative annotation: Complete relief would suggest area to be the source of the pain.          Long-term benefit: Defined as the period of time past the expected duration of local anesthetics. With the possible exception of prolonged sympathetic blockade from the local anesthetics, benefits during this period are typically attributed to, or associated with, other factors such as analgesic sensory neuropraxia, antiinflammatory effects, or beneficial biochemical changes provided by agents other than the local anesthetics Reported result: Extended relief following procedure: 90 % (Long-Term Relief) Interpretative annotation: Good relief. This could suggest inflammation to be a significant component in the etiology to the pain.          Current benefits: Defined as persistent relief that continues at this point in time.   Reported results: Treated area: 90 % Cheyenne Pierce  reports improvement in function Interpretative annotation: Ongoing benefits would suggest effective therapeutic approach  Interpretation: Results would suggest a successful diagnostic intervention.          Laboratory Chemistry  Inflammation Markers Lab Results  Component Value Date   CRP 0.7 09/12/2014   ESRSEDRATE 11 01/07/2016   (CRP: Acute Phase) (ESR: Chronic Phase) Renal Function Markers Lab Results  Component Value Date   BUN 13 06/05/2016   CREATININE 0.85 06/05/2016   GFRAA >60 05/22/2015   GFRNONAA >60 05/22/2015   Hepatic Function Markers Lab Results  Component Value Date   AST 27 02/07/2015   ALT 21 02/07/2015   ALBUMIN 4.0 02/07/2015   ALKPHOS 55 02/07/2015   Electrolytes Lab Results  Component Value Date   NA 136 06/05/2016   K 3.8 06/05/2016   CL 99 06/05/2016   CALCIUM 9.6 06/05/2016   Neuropathy Markers Lab Results  Component Value Date   VITAMINB12 >1500 (H) 09/22/2012   Bone Pathology Markers Lab Results  Component Value Date   ALKPHOS 55 02/07/2015   VD25OH 47.39 10/04/2013   CALCIUM 9.6 06/05/2016   Coagulation Parameters Lab Results  Component Value Date   INR 1.0 11/08/2012   LABPROT 13.8 11/08/2012   APTT 32.6 11/08/2012   PLT 337.0 01/07/2016   Cardiovascular Markers Lab Results  Component Value Date   HGB 13.8 01/07/2016   HCT 40.9 01/07/2016   Note: Lab results reviewed.  Recent Diagnostic Imaging Review  Dg C-arm 1-60 Min-no Report  Result Date: 07/02/2016 Fluoroscopy was utilized by the requesting physician.  No radiographic interpretation.   Note: Imaging results reviewed.          Meds  The patient has a current medication list which includes the following prescription(s): acetaminophen, amlodipine, aspirin, astragalus, biotin, calcium-vitamin d, diphenhydramine, hyoscyamine, levothyroxine, loperamide, magnesium, meclizine, nitroglycerin, pantoprazole, pregabalin, and triamterene-hydrochlorothiazide.  Current  Outpatient Prescriptions on File Prior to Visit  Medication Sig  . acetaminophen (TYLENOL) 500 MG tablet Take 500 mg by mouth every 6 (six) hours  as needed.  Marland Kitchen amLODipine (NORVASC) 2.5 MG tablet Take 1 tablet (2.5 mg total) by mouth daily.  Marland Kitchen aspirin 81 MG tablet Take 81 mg by mouth. Twice a week  . ASTRAGALUS PO Take 500 mg by mouth daily.  . Biotin 1000 MCG tablet Take 1,000 mcg by mouth daily.  . calcium-vitamin D (OSCAL WITH D) 500-200 MG-UNIT tablet Take 1 tablet by mouth.  . diphenhydrAMINE (BENADRYL) 25 mg capsule Take 25 mg by mouth at bedtime.   . hyoscyamine (LEVSIN, ANASPAZ) 0.125 MG tablet Take 1 tablet (0.125 mg total) by mouth 2 (two) times daily as needed.  Marland Kitchen levothyroxine (LEVOXYL) 75 MCG tablet Take 75 mcg by mouth daily. Take extra pill on Wednesday and Sunday  . loperamide (IMODIUM) 2 MG capsule Take by mouth as needed for diarrhea or loose stools.  . Magnesium 250 MG TABS Take by mouth daily.  . meclizine (ANTIVERT) 25 MG tablet take 1 tablet by mouth three times a day if needed  . nitroGLYCERIN (NITROSTAT) 0.4 MG SL tablet Place 0.4 mg under the tongue every 5 (five) minutes as needed.  . pantoprazole (PROTONIX) 40 MG tablet Take 1 tablet (40 mg total) by mouth daily.  Marland Kitchen triamterene-hydrochlorothiazide (MAXZIDE-25) 37.5-25 MG tablet take 1 tablet by mouth once daily   No current facility-administered medications on file prior to visit.    ROS  Constitutional: Denies any fever or chills Gastrointestinal: No reported hemesis, hematochezia, vomiting, or acute GI distress Musculoskeletal: Denies any acute onset joint swelling, redness, loss of ROM, or weakness Neurological: No reported episodes of acute onset apraxia, aphasia, dysarthria, agnosia, amnesia, paralysis, loss of coordination, or loss of consciousness  Allergies  Cheyenne Pierce is allergic to codeine; penicillins; prednisone; zoloft [sertraline hcl]; amoxicillin; codeine phosphate; lactose intolerance (gi);  metronidazole; perindopril erbumine; potassium-containing compounds; prozac [fluoxetine hcl]; tramadol hcl; and oxcarbazepine.  PFSH  Drug: Cheyenne Pierce  reports that she does not use drugs. Alcohol:  reports that she does not drink alcohol. Tobacco:  reports that she has quit smoking. She has a 30.00 pack-year smoking history. She has never used smokeless tobacco. Medical:  has a past medical history of Allergy; Angina pectoris ([place]); Anxiety; Cataract; Chronic back pain; DDD (degenerative disc disease), cervical; DDD (degenerative disc disease), cervical; DDD (degenerative disc disease), lumbar; Dental bridge present; Depression; Diverticulosis; GERD (gastroesophageal reflux disease); Headache; Heart murmur; History of bronchitis; History of colon polyps; History of gastric ulcer; History of hiatal hernia; Hypertension; Hypothyroidism; IBS (irritable bowel syndrome); Insomnia; Joint pain; Joint swelling; Mass of vagina; Nocturia; Peripheral neuropathy; Peripheral neuropathy; Urinary frequency; Urinary urgency; and Vertigo. Family: family history includes Heart disease in her mother; Stroke in her brother.  Past Surgical History:  Procedure Laterality Date  . ABDOMINAL HYSTERECTOMY  1968   left ovary remains  . ANTERIOR CERVICAL DECOMPRESSION/DISCECTOMY FUSION 4 LEVELS N/A 06/05/2015   Procedure: ANTERIOR CERVICAL DECOMPRESSION/DISCECTOMY FUSION CERVICAL THREE-FOUR,CERVICAL FOUR-FIVE,CERVICAL FIVE-SIX,CERVICAL SIX-SEVEN;  Surgeon: Earnie Larsson, MD;  Location: [place] [facility];  Service: Neurosurgery;  Laterality: N/A;  right side approach  . APPENDECTOMY  1948  . bladder suspension with vaginal sling    . CARDIAC CATHETERIZATION  09/26/1998  . CATARACT EXTRACTION W/PHACO Left 10/15/2015   Procedure: CATARACT EXTRACTION PHACO AND INTRAOCULAR LENS PLACEMENT (IOC) LEFT EYE;  Surgeon: Ronnell Freshwater, MD;  Location: [place];  Service: Ophthalmology;  Laterality: Left;  LEFT LEAVE PT LAST   . CATARACT EXTRACTION W/PHACO Right 11/26/2015   Procedure: CATARACT EXTRACTION PHACO AND INTRAOCULAR LENS  PLACEMENT (IOC);  Surgeon: Ronnell Freshwater, MD;  Location: [place];  Service: Ophthalmology;  Laterality: Right;  RIGHT  . COLONOSCOPY  07/2009   [facility]), normal per pt  . COLONOSCOPY  06/2014   int hem, rpt 5 yrs ([name])  . COLOSTOMY W/ RECTOCELE REPAIR  06/2004  . DEXA  2013   T -1.0  . epidural infections    . epidural steroid injections  multiple latest 05/2014, 07/2014   help periph neuropathy [name])  . ESOPHAGOGASTRODUODENOSCOPY    . EYE SURGERY Left 10/15/2015   cataract removal  . HERNIA REPAIR    . NM MYOVIEW LTD  2009   WNL per report  . OVARIAN CYST SURGERY  1963   cyst on ovaries  . TONSILLECTOMY    . US ECHOCARDIOGRAPHY  2009   mild aortic/mitral insuff, ER 91%, mild diastolic dysfunction  . VENTRAL HERNIA REPAIR  08/2008   abdominal wall, lysis of adhesions   Constitutional Exam  General appearance: Well nourished, well developed, and well hydrated. In no apparent acute distress Vitals:   07/30/16 1338  BP: 130/86  Pulse: (!) 40  Resp: 16  Temp: 98 F (36.7 C)  TempSrc: Oral  SpO2: 99%  Weight: 140 lb (63.5 kg)  Height: 5' (1.524 m)   BMI Assessment: Estimated body mass index is 27.34 kg/m as calculated from the following:   Height as of this encounter: 5' (1.524 m).   Weight as of this encounter: 140 lb (63.5 kg).  BMI interpretation table: BMI level Category Range association with higher incidence of chronic pain  <18 kg/m2 Underweight   18.5-24.9 kg/m2 Ideal body weight   25-29.9 kg/m2 Overweight Increased incidence by 20%  30-34.9 kg/m2 Obese (Class I) Increased incidence by 68%  35-39.9 kg/m2 Severe obesity (Class II) Increased incidence by 136%  >40 kg/m2 Extreme obesity (Class III) Increased incidence by 254%   BMI Readings from Last 4 Encounters:  07/30/16 27.34 kg/m  07/02/16 26.37 kg/m  07/01/16 26.37  kg/m  06/05/16 27.78 kg/m   Wt Readings from Last 4 Encounters:  07/30/16 140 lb (63.5 kg)  07/02/16 135 lb (61.2 kg)  07/01/16 135 lb (61.2 kg)  06/05/16 142 lb 4 oz (64.5 kg)  Psych/Mental status: Alert, oriented x 3 (person, place, & time)       Eyes: PERLA Respiratory: No evidence of acute respiratory distress  Cervical Spine Exam  Inspection: No masses, redness, or swelling Alignment: Symmetrical Functional ROM: Unrestricted ROM      Stability: No instability detected Muscle strength & Tone: Functionally intact Sensory: Unimpaired Palpation: No palpable anomalies              Upper Extremity (UE) Exam    Side: Right upper extremity  Side: Left upper extremity  Inspection: No masses, redness, swelling, or asymmetry. No contractures  Inspection: No masses, redness, swelling, or asymmetry. No contractures  Functional ROM: Unrestricted ROM          Functional ROM: Unrestricted ROM          Muscle strength & Tone: Functionally intact  Muscle strength & Tone: Functionally intact  Sensory: Unimpaired  Sensory: Unimpaired  Palpation: No palpable anomalies              Palpation: No palpable anomalies              Specialized Test(s): Deferred         Specialized Test(s): Deferred  Thoracic Spine Exam  Inspection: No masses, redness, or swelling Alignment: Symmetrical Functional ROM: Unrestricted ROM Stability: No instability detected Sensory: Unimpaired Muscle strength & Tone: No palpable anomalies  Lumbar Spine Exam  Inspection: No masses, redness, or swelling Alignment: Symmetrical Functional ROM: Unrestricted ROM      Stability: No instability detected Muscle strength & Tone: Functionally intact Sensory: Unimpaired Palpation: No palpable anomalies       Provocative Tests: Lumbar Hyperextension and rotation test: evaluation deferred today       Patrick's Maneuver: evaluation deferred today                    Gait & Posture Assessment  Ambulation:  Unassisted Gait: Relatively normal for age and body habitus Posture: WNL   Lower Extremity Exam    Side: Right lower extremity  Side: Left lower extremity  Inspection: No masses, redness, swelling, or asymmetry. No contractures  Inspection: No masses, redness, swelling, or asymmetry. No contractures  Functional ROM: Unrestricted ROM          Functional ROM: Unrestricted ROM          Muscle strength & Tone: Functionally intact  Muscle strength & Tone: Functionally intact  Sensory: Unimpaired  Sensory: Unimpaired  Palpation: No palpable anomalies  Palpation: No palpable anomalies   Assessment  Primary Diagnosis & Pertinent Problem List: The primary encounter diagnosis was Chronic neck pain (Location of Primary Source of Pain) (Bilateral) (R>L). Diagnoses of Chronic cervical radiculopathy (Bilateral) (R>L) (pain, numbness, and weakness), Chronic cervical radicular pain (Bilateral) (R>L), Osteoarthritis of spine with radiculopathy, cervical region, Cervical fusion syndrome (C3-C7 ACDF), Cervical spinal stenosis (C3 through C7), Cervical foraminal stenosis (C7-T1) (Bilateral), Abnormal Cervical MRI (01/24/15), Neurogenic pain, and Fibromyalgia were also pertinent to this visit.  Status Diagnosis  Controlled Controlled Controlled 1. Chronic neck pain (Location of Primary Source of Pain) (Bilateral) (R>L)   2. Chronic cervical radiculopathy (Bilateral) (R>L) (pain, numbness, and weakness)   3. Chronic cervical radicular pain (Bilateral) (R>L)   4. Osteoarthritis of spine with radiculopathy, cervical region   5. Cervical fusion syndrome (C3-C7 ACDF)   6. Cervical spinal stenosis (C3 through C7)   7. Cervical foraminal stenosis (C7-T1) (Bilateral)   8. Abnormal Cervical MRI (01/24/15)   9. Neurogenic pain   10. Fibromyalgia      Plan of Care  Pharmacotherapy (Medications Ordered): Meds ordered this encounter  Medications  . pregabalin (LYRICA) 150 MG capsule    Sig: Take 1 capsule (150 mg  total) by mouth 3 (three) times daily.    Dispense:  90 capsule    Refill:  5    Do not place this medication, or any other prescription from our practice, on "Automatic Refill". Patient may have prescription filled one day early if pharmacy is closed on scheduled refill date.   New Prescriptions   No medications on file   Medications administered today: Cheyenne Pierce had no medications administered during this visit. Lab-work, procedure(s), and/or referral(s): No orders of the defined types were placed in this encounter.  Imaging and/or referral(s): None  Interventional therapies: Planned, scheduled, and/or pending:   Right cervical facet RFA   Considering:   Right cervical facet block Palliative right cervical epidural steroid injections Palliative left L4-5 lumbar epidural steroid injection   Palliative PRN treatment(s):   Palliative right cervical epidural steroid injections Palliative left L4-5 lumbar epidural steroid injection   Provider-requested follow-up: Return in about 6 months (around 02/05/2017) for Med-Mgmt, w/ NP, in addition,  Procedure: Right Cervical Fct RFA, by MD.  Future Appointments Date Time Provider [place]  08/18/2016 10:45 AM Milinda Pointer, MD [facility] None  09/29/2016 1:00 PM Milinda Pointer, MD [facility] None  10/30/2016 11:15 AM Eustace Pen, LPN [id number]  11/06/2016 11:30 AM Ria Bush, MD [id number]  01/29/2017 12:45 PM [place], NP [facility] None   Primary Care Physician: Ria Bush, MD Location: [facility] Note by: Kathlen Brunswick. [name], M.D, DABA, DABAPM, DABPM, DABIPP, FIPP Date: 07/30/2016; Time: 4:16 PM  Patient instructions provided during this appointment: Patient Instructions  Preparing for Procedure with Sedation Instructions: . Oral Intake: Do not eat or drink anything for at least 8 hours prior to your procedure. . Transportation: Public  transportation is not allowed. Bring an adult driver. The driver must be physically present in our waiting room before any procedure can be started. Marland Kitchen Physical Assistance: Bring an adult physically capable of assisting you, in the event you need help. This adult should keep you company at home for at least 6 hours after the procedure. . Blood Pressure Medicine: Take your blood pressure medicine with a sip of water the morning of the procedure. . Blood thinners:  . Diabetics on insulin: Notify the staff so that you can be scheduled 1st case in the morning. If your diabetes requires high dose insulin, take only  of your normal insulin dose the morning of the procedure and notify the staff that you have done so. . Preventing infections: Shower with an antibacterial soap the morning of your procedure. . Build-up your immune system: Take 1000 mg of Vitamin C with every meal (3 times a day) the day prior to your procedure. Marland Kitchen Antibiotics: Inform the staff if you have a condition or reason that requires you to take antibiotics before dental procedures. . Pregnancy: If you are pregnant, call and cancel the procedure. . Sickness: If you have a cold, fever, or any active infections, call and cancel the procedure. . Arrival: You must be in the facility at least 30 minutes prior to your scheduled procedure. . Children: Do not bring children with you. . Dress appropriately: Bring dark clothing that you would not mind if they get stained. . Valuables: Do not bring any jewelry or valuables. Procedure appointments are reserved for interventional treatments only. Marland Kitchen No Prescription Refills. . No medication changes will be discussed during procedure appointments. . No disability issues will be discussed. ______________________________________________________________________________________________

## 2016-07-30 NOTE — Patient Instructions (Signed)

## 2016-08-18 ENCOUNTER — Ambulatory Visit (HOSPITAL_BASED_OUTPATIENT_CLINIC_OR_DEPARTMENT_OTHER): Payer: Medicare Other | Admitting: Pain Medicine

## 2016-08-18 ENCOUNTER — Encounter: Payer: Self-pay | Admitting: Pain Medicine

## 2016-08-18 ENCOUNTER — Ambulatory Visit
Admission: RE | Admit: 2016-08-18 | Discharge: 2016-08-18 | Disposition: A | Discharge status: Home / Self Care | Payer: Medicare Other | Source: Ambulatory Visit | Attending: Pain Medicine | Admitting: Pain Medicine

## 2016-08-18 VITALS — BP 126/81 | HR 56 | Temp 97.1°F | Resp 12 | Ht 60.0 in | Wt 135.0 lb

## 2016-08-18 DIAGNOSIS — M542 Cervicalgia: Secondary | ICD-10-CM

## 2016-08-18 DIAGNOSIS — M4692 Unspecified inflammatory spondylopathy, cervical region: Secondary | ICD-10-CM

## 2016-08-18 DIAGNOSIS — Z9841 Cataract extraction status, right eye: Secondary | ICD-10-CM | POA: Insufficient documentation

## 2016-08-18 DIAGNOSIS — M47892 Other spondylosis, cervical region: Secondary | ICD-10-CM | POA: Diagnosis not present

## 2016-08-18 DIAGNOSIS — Z888 Allergy status to other drugs, medicaments and biological substances status: Secondary | ICD-10-CM | POA: Diagnosis not present

## 2016-08-18 DIAGNOSIS — Z9889 Other specified postprocedural states: Secondary | ICD-10-CM | POA: Insufficient documentation

## 2016-08-18 DIAGNOSIS — Z880 Allergy status to penicillin: Secondary | ICD-10-CM | POA: Diagnosis not present

## 2016-08-18 DIAGNOSIS — G8929 Other chronic pain: Secondary | ICD-10-CM

## 2016-08-18 DIAGNOSIS — K439 Ventral hernia without obstruction or gangrene: Secondary | ICD-10-CM | POA: Diagnosis not present

## 2016-08-18 DIAGNOSIS — Z885 Allergy status to narcotic agent status: Secondary | ICD-10-CM | POA: Insufficient documentation

## 2016-08-18 DIAGNOSIS — G8918 Other acute postprocedural pain: Secondary | ICD-10-CM | POA: Diagnosis not present

## 2016-08-18 DIAGNOSIS — M47812 Spondylosis without myelopathy or radiculopathy, cervical region: Secondary | ICD-10-CM | POA: Insufficient documentation

## 2016-08-18 DIAGNOSIS — M549 Dorsalgia, unspecified: Secondary | ICD-10-CM | POA: Diagnosis not present

## 2016-08-18 DIAGNOSIS — N816 Rectocele: Secondary | ICD-10-CM | POA: Diagnosis not present

## 2016-08-18 MED ORDER — LIDOCAINE HCL (PF) 1 % IJ SOLN: 10.0000 mL | Freq: Once | INTRAMUSCULAR | Status: DC

## 2016-08-18 MED ORDER — HYDROCODONE-ACETAMINOPHEN 5-325 MG PO TABS: 1.0000 | ORAL_TABLET | Freq: Three times a day (TID) | ORAL | 0 refills | Status: DC | PRN

## 2016-08-18 MED ORDER — MIDAZOLAM HCL 5 MG/5ML IJ SOLN
1.0000 mg | INTRAMUSCULAR | Status: DC | PRN
  Administered 2016-08-18: 1.5 mg via INTRAVENOUS
  Filled 2016-08-18: qty 5

## 2016-08-18 MED ORDER — ROPIVACAINE HCL 2 MG/ML IJ SOLN
9.0000 mL | Freq: Once | INTRAMUSCULAR | Status: AC
  Administered 2016-08-18: 10 mL
  Filled 2016-08-18: qty 10

## 2016-08-18 MED ORDER — FENTANYL CITRATE (PF) 100 MCG/2ML IJ SOLN
25.0000 ug | INTRAMUSCULAR | Status: AC | PRN
  Administered 2016-08-18 (×2): 100 ug via INTRAVENOUS
  Filled 2016-08-18: qty 2

## 2016-08-18 MED ORDER — LACTATED RINGERS IV SOLN: 1000.0000 mL | Freq: Once | INTRAVENOUS | Status: DC

## 2016-08-18 MED ORDER — DEXAMETHASONE SODIUM PHOSPHATE 10 MG/ML IJ SOLN
10.0000 mg | Freq: Once | INTRAMUSCULAR | Status: DC
  Filled 2016-08-18: qty 1

## 2016-08-18 NOTE — Patient Instructions (Addendum)
Post-Procedure instructions Instructions:  Apply ice: Fill a plastic sandwich bag with crushed ice. Cover it with a small towel and apply to injection site. Apply for 15 minutes then remove x 15 minutes. Repeat sequence on day of procedure, until you go to bed. The purpose is to minimize swelling and discomfort after procedure.  Apply heat: Apply heat to procedure site starting the day following the procedure. The purpose is to treat any soreness and discomfort from the procedure.  Food intake: Start with clear liquids (like water) and advance to regular food, as tolerated.   Physical activities: Keep activities to a minimum for the first 8 hours after the procedure.   Driving: If you have received any sedation, you are not allowed to drive for 24 hours after your procedure.  Blood thinner: Restart your blood thinner 6 hours after your procedure. (Only for those taking blood thinners)  Insulin: As soon as you can eat, you may resume your normal dosing schedule. (Only for those taking insulin)  Infection prevention: Keep procedure site clean and dry.  Post-procedure Pain Diary: Extremely important that this be done correctly and accurately. Recorded information will be used to determine the next step in treatment.  Pain evaluated is that of treated area only. Do not include pain from an untreated area.  Complete every hour, on the hour, for the initial 8 hours. Set an alarm to help you do this part accurately.  Do not go to sleep and have it completed later. It will not be accurate.  Follow-up appointment: Keep your follow-up appointment after the procedure. Usually 2 weeks for most procedures. (6 weeks in the case of radiofrequency.) Bring you pain diary.  Expect:  From numbing medicine (AKA: Local Anesthetics): Numbness or decrease in pain.  Onset: Full effect within 15 minutes of injected.  Duration: It will depend on the type of local anesthetic used. On the average, 1 to 8  hours.   From steroids: Decrease in swelling or inflammation. Once inflammation is improved, relief of the pain will follow.  Onset of benefits: Depends on the amount of swelling present. The more swelling, the longer it will take for the benefits to be seen.   Duration: Steroids will stay in the system x 2 weeks. Duration of benefits will depend on multiple posibilities including persistent irritating factors.  From procedure: Some discomfort is to be expected once the numbing medicine wears off. This should be minimal if ice and heat are applied as instructed. Call if:  You experience numbness and weakness that gets worse with time, as opposed to wearing off.  New onset bowel or bladder incontinence. (Spinal procedures only)  Emergency Numbers:  [place] business hours (Monday - Thursday, 8:00 AM - 4:00 PM) (Friday, 9:00 AM - 12:00 Noon): [phone]  After hours: [phone] _____________________________________________________________________________________________  Radiofrequency Lesioning, Care After Refer to this sheet in the next few weeks. These instructions provide you with information about caring for yourself after your procedure. Your health care provider may also give you more specific instructions. Your treatment has been planned according to current medical practices, but problems sometimes occur. Call your health care provider if you have any problems or questions after your procedure. What can I expect after the procedure? After the procedure, it is common to have:  Pain from the burned nerve.  Temporary numbness. Follow these instructions at home:  Take over-the-counter and prescription medicines only as told by your health care provider.  Return to your normal activities  as told by your health care provider. Ask your health care provider what activities are safe for you.  Pay close attention to how you feel after the procedure. If you start to have pain,  write down when it hurts and how it feels. This will help you and your health care provider to know if you need an additional treatment.  Check your needle insertion site every day for signs of infection. Watch for:  Redness, swelling, or pain.  Fluid, blood, or pus.  Keep all follow-up visits as told by your health care provider. This is important. Contact a health care provider if:  Your pain does not get better.  You have redness, swelling, or pain at the needle insertion site.  You have fluid, blood, or pus coming from the needle insertion site.  You have a fever. Get help right away if:  You develop sudden, severe pain.  You develop numbness or tingling near the procedure site that does not go away. This information is not intended to replace advice given to you by your health care provider. Make sure you discuss any questions you have with your health care provider. Document Released: 11/21/2010 Document Revised: 08/30/2015 Document Reviewed: 05/01/2014 Elsevier Interactive Patient Education  2017 [place]. Radiofrequency Lesioning Radiofrequency lesioning is a procedure that is performed to relieve pain. The procedure is often used for back, neck, or arm pain. Radiofrequency lesioning involves the use of a machine that creates radio waves to make heat. During the procedure, the heat is applied to the nerve that carries the pain signal. The heat damages the nerve and interferes with the pain signal. Pain relief usually starts about 2 weeks after the procedure and lasts for 6 months to 1 year. Tell a health care provider about:  Any allergies you have.  All medicines you are taking, including vitamins, herbs, eye drops, creams, and over-the-counter medicines.  Any problems you or family members have had with anesthetic medicines.  Any blood disorders you have.  Any surgeries you have had.  Any medical conditions you have.  Whether you are pregnant or may be  pregnant. What are the risks? Generally, this is a safe procedure. However, problems may occur, including:  Pain or soreness at the injection site.  Infection at the injection site.  Damage to nerves or blood vessels. What happens before the procedure?  Ask your health care provider about:  Changing or stopping your regular medicines. This is especially important if you are taking diabetes medicines or blood thinners.  Taking medicines such as aspirin and ibuprofen. These medicines can thin your blood. Do not take these medicines before your procedure if your health care provider instructs you not to.  Follow instructions from your health care provider about eating or drinking restrictions.  Plan to have someone take you home after the procedure.  If you go home right after the procedure, plan to have someone with you for 24 hours. What happens during the procedure?  You will be given one or more of the following:  A medicine to help you relax (sedative).  A medicine to numb the area (local anesthetic).  You will be awake during the procedure. You will need to be able to talk with the health care provider during the procedure.  With the help of a type of X-ray (fluoroscopy), the health care provider will insert a radiofrequency needle into the area to be treated.  Next, a wire that carries the radio waves (electrode) will be  put through the radiofrequency needle. An electrical pulse will be sent through the electrode to verify the correct nerve. You will feel a tingling sensation, and you may have muscle twitching.  Then, the tissue that is around the needle tip will be heated by an electric current that is passed using the radiofrequency machine. This will numb the nerves.  A bandage (dressing) will be put on the insertion area after the procedure is done. The procedure may vary among health care providers and hospitals. What happens after the procedure?  Your blood  pressure, heart rate, breathing rate, and blood oxygen level will be monitored often until the medicines you were given have worn off.  Return to your normal activities as directed by your health care provider. This information is not intended to replace advice given to you by your health care provider. Make sure you discuss any questions you have with your health care provider. Document Released: 11/20/2010 Document Revised: 08/30/2015 Document Reviewed: 05/01/2014 Elsevier Interactive Patient Education  2017 [place]. Pain Management Discharge Instructions  General Discharge Instructions :  If you need to reach your doctor call: Monday-Friday 8:00 am - 4:00 pm at [phone] or toll free [phone].  After clinic hours [phone] to have operator reach doctor.  Bring all of your medication bottles to all your appointments in the pain clinic.  To cancel or reschedule your appointment with Pain Management please remember to call 24 hours in advance to avoid a fee.  Refer to the educational materials which you have been given on: General Risks, I had my Procedure. Discharge Instructions, Post Sedation.  Post Procedure Instructions:  The drugs you were given will stay in your system until tomorrow, so for the next 24 hours you should not drive, make any legal decisions or drink any alcoholic beverages.  You may eat anything you prefer, but it is better to start with liquids then soups and crackers, and gradually work up to solid foods.  Please notify your doctor immediately if you have any unusual bleeding, trouble breathing or pain that is not related to your normal pain.  Depending on the type of procedure that was done, some parts of your body may feel week and/or numb.  This usually clears up by tonight or the next day.  Walk with the use of an assistive device or accompanied by an adult for the 24 hours.  You may use ice on the affected area for the first 24 hours.  Put  ice in a Ziploc bag and cover with a towel and place against area 15 minutes on 15 minutes off.  You may switch to heat after 24 hours.GENERAL RISKS AND COMPLICATIONS  What are the risk, side effects and possible complications? Generally speaking, most procedures are safe.  However, with any procedure there are risks, side effects, and the possibility of complications.  The risks and complications are dependent upon the sites that are lesioned, or the type of nerve block to be performed.  The closer the procedure is to the spine, the more serious the risks are.  Great care is taken when placing the radio frequency needles, block needles or lesioning probes, but sometimes complications can occur. 1. Infection: Any time there is an injection through the skin, there is a risk of infection.  This is why sterile conditions are used for these blocks.  There are four possible types of infection. 1. Localized skin infection. 2. Central Nervous System Infection-This can be in the form of Meningitis,  which can be deadly. 3. Epidural Infections-This can be in the form of an epidural abscess, which can cause pressure inside of the spine, causing compression of the spinal cord with subsequent paralysis. This would require an emergency surgery to decompress, and there are no guarantees that the patient would recover from the paralysis. 4. Discitis-This is an infection of the intervertebral discs.  It occurs in about 1% of discography procedures.  It is difficult to treat and it may lead to surgery.        2. Pain: the needles have to go through skin and soft tissues, will cause soreness.       3. Damage to internal structures:  The nerves to be lesioned may be near blood vessels or    other nerves which can be potentially damaged.       4. Bleeding: Bleeding is more common if the patient is taking blood thinners such as  aspirin, Coumadin, Ticiid, Plavix, etc., or if he/she have some genetic predisposition  such as  hemophilia. Bleeding into the spinal canal can cause compression of the spinal  cord with subsequent paralysis.  This would require an emergency surgery to  decompress and there are no guarantees that the patient would recover from the  paralysis.       5. Pneumothorax:  Puncturing of a lung is a possibility, every time a needle is introduced in  the area of the chest or upper back.  Pneumothorax refers to free air around the  collapsed lung(s), inside of the thoracic cavity (chest cavity).  Another two possible  complications related to a similar event would include: Hemothorax and Chylothorax.   These are variations of the Pneumothorax, where instead of air around the collapsed  lung(s), you may have blood or chyle, respectively.       6. Spinal headaches: They may occur with any procedures in the area of the spine.       7. Persistent CSF (Cerebro-Spinal Fluid) leakage: This is a rare problem, but may occur  with prolonged intrathecal or epidural catheters either due to the formation of a fistulous  track or a dural tear.       8. Nerve damage: By working so close to the spinal cord, there is always a possibility of  nerve damage, which could be as serious as a permanent spinal cord injury with  paralysis.       9. Death:  Although rare, severe deadly allergic reactions known as "Anaphylactic  reaction" can occur to any of the medications used.      10. Worsening of the symptoms:  We can always make thing worse.  What are the chances of something like this happening? Chances of any of this occuring are extremely low.  By statistics, you have more of a chance of getting killed in a motor vehicle accident: while driving to the hospital than any of the above occurring .  Nevertheless, you should be aware that they are possibilities.  In general, it is similar to taking a shower.  Everybody knows that you can slip, hit your head and get killed.  Does that mean that you should not shower again?  Nevertheless  always keep in mind that statistics do not mean anything if you happen to be on the wrong side of them.  Even if a procedure has a 1 (one) in a 1,000,000 (million) chance of going wrong, it you happen to be that one..Also, keep in mind that by statistics,  you have more of a chance of having something go wrong when taking medications.  Who should not have this procedure? If you are on a blood thinning medication (e.g. Coumadin, Plavix, see list of "Blood Thinners"), or if you have an active infection going on, you should not have the procedure.  If you are taking any blood thinners, please inform your physician.  How should I prepare for this procedure?  Do not eat or drink anything at least six hours prior to the procedure.  Bring a driver with you .  It cannot be a taxi.  Come accompanied by an adult that can drive you back, and that is strong enough to help you if your legs get weak or numb from the local anesthetic.  Take all of your medicines the morning of the procedure with just enough water to swallow them.  If you have diabetes, make sure that you are scheduled to have your procedure done first thing in the morning, whenever possible.  If you have diabetes, take only half of your insulin dose and notify our nurse that you have done so as soon as you arrive at the clinic.  If you are diabetic, but only take blood sugar pills (oral hypoglycemic), then do not take them on the morning of your procedure.  You may take them after you have had the procedure.  Do not take aspirin or any aspirin-containing medications, at least eleven (11) days prior to the procedure.  They may prolong bleeding.  Wear loose fitting clothing that may be easy to take off and that you would not mind if it got stained with Betadine or blood.  Do not wear any jewelry or perfume  Remove any nail coloring.  It will interfere with some of our monitoring equipment.  NOTE: Remember that this is not meant to be  interpreted as a complete list of all possible complications.  Unforeseen problems may occur.  BLOOD THINNERS The following drugs contain aspirin or other products, which can cause increased bleeding during surgery and should not be taken for 2 weeks prior to and 1 week after surgery.  If you should need take something for relief of minor pain, you may take acetaminophen which is found in Tylenol,m Datril, Anacin-3 and Panadol. It is not blood thinner. The products listed below are.  Do not take any of the products listed below in addition to any listed on your instruction sheet.  A.P.C or A.P.C with Codeine Codeine Phosphate Capsules #3 Ibuprofen Ridaura  ABC compound Congesprin Imuran rimadil  Advil Cope Indocin Robaxisal  Alka-Seltzer Effervescent Pain Reliever and Antacid Coricidin or Coricidin-D  Indomethacin Rufen  Alka-Seltzer plus Cold Medicine Cosprin Ketoprofen S-A-C Tablets  Anacin Analgesic Tablets or Capsules Coumadin Korlgesic Salflex  Anacin Extra Strength Analgesic tablets or capsules CP-2 Tablets Lanoril Salicylate  Anaprox Cuprimine Capsules Levenox Salocol  Anexsia-D Dalteparin Magan Salsalate  Anodynos Darvon compound Magnesium Salicylate Sine-off  Ansaid Dasin Capsules Magsal Sodium Salicylate  Anturane Depen Capsules Marnal Soma  APF Arthritis pain formula Dewitt's Pills Measurin Stanback  Argesic Dia-Gesic Meclofenamic Sulfinpyrazone  Arthritis Bayer Timed Release Aspirin Diclofenac Meclomen Sulindac  Arthritis pain formula Anacin Dicumarol Medipren Supac  Analgesic (Safety coated) Arthralgen Diffunasal Mefanamic Suprofen  Arthritis Strength Bufferin Dihydrocodeine Mepro Compound Suprol  Arthropan liquid Dopirydamole Methcarbomol with Aspirin Synalgos  ASA tablets/Enseals Disalcid Micrainin Tagament  Ascriptin Doan's Midol Talwin  Ascriptin A/D Dolene Mobidin Tanderil  Ascriptin Extra Strength Dolobid Moblgesic Ticlid  Ascriptin with Codeine Doloprin  or Doloprin  with Codeine Momentum Tolectin  Asperbuf Duoprin Mono-gesic Trendar  Aspergum Duradyne Motrin or Motrin IB Triminicin  Aspirin plain, buffered or enteric coated Durasal Myochrisine Trigesic  Aspirin Suppositories Easprin Nalfon Trillsate  Aspirin with Codeine Ecotrin Regular or Extra Strength Naprosyn Uracel  Atromid-S Efficin Naproxen Ursinus  Auranofin Capsules Elmiron Neocylate Vanquish  Axotal Emagrin Norgesic Verin  Azathioprine Empirin or Empirin with Codeine Normiflo Vitamin E  Azolid Emprazil Nuprin Voltaren  Bayer Aspirin plain, buffered or children's or timed BC Tablets or powders Encaprin Orgaran Warfarin Sodium  Buff-a-Comp Enoxaparin Orudis Zorpin  Buff-a-Comp with Codeine Equegesic Os-Cal-Gesic   Buffaprin Excedrin plain, buffered or Extra Strength Oxalid   Bufferin Arthritis Strength Feldene Oxphenbutazone   Bufferin plain or Extra Strength Feldene Capsules Oxycodone with Aspirin   Bufferin with Codeine Fenoprofen Fenoprofen Pabalate or Pabalate-SF   Buffets II Flogesic Panagesic   Buffinol plain or Extra Strength Florinal or Florinal with Codeine Panwarfarin   Buf-Tabs Flurbiprofen Penicillamine   Butalbital Compound Four-way cold tablets Penicillin   Butazolidin Fragmin Pepto-Bismol   Carbenicillin Geminisyn Percodan   Carna Arthritis Reliever Geopen Persantine   Carprofen Gold's salt Persistin   Chloramphenicol Goody's Phenylbutazone   Chloromycetin Haltrain Piroxlcam   Clmetidine heparin Plaquenil   Cllnoril Hyco-pap Ponstel   Clofibrate Hydroxy chloroquine Propoxyphen         Before stopping any of these medications, be sure to consult the physician who ordered them.  Some, such as Coumadin (Warfarin) are ordered to prevent or treat serious conditions such as "deep thrombosis", "pumonary embolisms", and other heart problems.  The amount of time that you may need off of the medication may also vary with the medication and the reason for which you were taking it.   If you are taking any of these medications, please make sure you notify your pain physician before you undergo any procedures.

## 2016-08-18 NOTE — Progress Notes (Signed)
Safety precautions to be maintained throughout the outpatient stay will include: orient to surroundings, keep bed in low position, maintain call bell within reach at all times, provide assistance with transfer out of bed and ambulation.  

## 2016-08-18 NOTE — Progress Notes (Signed)
Patient's Name: Cheyenne Pierce  MRN: [id number]  Referring Provider: Milinda Pointer, MD  DOB: 1934-08-16  PCP: Ria Bush, MD  DOS: 08/18/2016  Note by: Kathlen Brunswick. [name], MD  Service setting: Ambulatory outpatient  Location: [facility] (AMB) Pain Management Facility  Visit type: Procedure  Specialty: Interventional Pain Management  Patient type: Established   Primary Reason for Visit: Interventional Pain Management Treatment. CC: Neck Pain (between the shoulders) and Back Pain (all over the back)  Procedure:  Anesthesia, Analgesia, Anxiolysis:  Type: Therapeutic Medial Branch Facet Radiofrequency Ablation Region: Posterolateral Cervical Level: C3, C4, C5, C6, & C7 Medial Branch Level(s) Laterality: Right-Sided Paraspinal  Type: Local Anesthesia with Moderate (Conscious) Sedation Local Anesthetic: Lidocaine 1% Route: Intravenous (IV) IV Access: Secured Sedation: Meaningful verbal contact was maintained at all times during the procedure  Indication(s): Analgesia and Anxiety  Indications: 1. Cervical facet syndrome (Right)   2. Cervical facet hypertrophy (Bilateral)   3. Spondylosis of cervical region without myelopathy or radiculopathy   4. Chronic neck pain (Location of Primary Source of Pain) (Bilateral) (R>L)   5. Acute postoperative pain    Cheyenne Pierce has either failed to respond, was unable to tolerate, or simply did not get enough benefit from other more conservative therapies including, but not limited to: 1. Over-the-counter medications 2. Anti-inflammatory medications 3. Muscle relaxants 4. Membrane stabilizers 5. Opioids 6. Physical therapy 7. Modalities (Heat, ice, etc.) 8. Invasive techniques such as nerve blocks. Cheyenne Pierce has attained more than 50% relief of the pain from a series of diagnostic injections conducted in separate occasions.  Pain Score: Pre-procedure: 2 /10 Post-procedure: 0-No pain/10  Pre-op Assessment:  Previous date of service: 07/30/16  Service provided: Evaluation Cheyenne Pierce is a 82 y.o. (year old), female patient, seen today for interventional treatment. She  has a past surgical history that includes Appendectomy (1948); Abdominal hysterectomy (1968); Ovarian cyst surgery (1963); Hernia repair; Colostomy w/ rectocele repair (06/2004); Ventral hernia repair (08/2008); Colonoscopy (07/2009); epidural steroid injections (multiple latest 05/2014, 07/2014); US ECHOCARDIOGRAPHY (2009); NM MYOVIEW LTD (2009); DEXA (2013); Colonoscopy (06/2014); Tonsillectomy; Cardiac catheterization (09/26/1998); bladder suspension with vaginal sling; epidural infections; Esophagogastroduodenoscopy; Anterior cervical decompression/discectomy fusion 4 level (N/A, 06/05/2015); Cataract extraction w/PHACO (Left, 10/15/2015); Eye surgery (Left, 10/15/2015); and Cataract extraction w/PHACO (Right, 11/26/2015). Her primarily concern today is the Neck Pain (between the shoulders) and Back Pain (all over the back)  Initial Vital Signs: Blood pressure (!) 148/77, pulse 60, temperature 98 F (36.7 C), temperature source Oral, resp. rate 16, height 5' (1.524 m), weight 135 lb (61.2 kg), SpO2 99 %. BMI: 26.37 kg/m  Risk Assessment: Allergies: Reviewed. She is allergic to codeine; penicillins; prednisone; zoloft [sertraline hcl]; amoxicillin; codeine phosphate; lactose intolerance (gi); metronidazole; perindopril erbumine; potassium-containing compounds; prozac [fluoxetine hcl]; tramadol hcl; and oxcarbazepine.  Allergy Precautions: None required Coagulopathies: Reviewed. None identified.  Blood-thinner therapy: None at this time Active Infection(s): Reviewed. None identified. Cheyenne Pierce is afebrile  Site Confirmation: Cheyenne Pierce was asked to confirm the procedure and laterality before marking the site Procedure checklist: Completed Consent: Before the procedure and under the influence of no sedative(s), amnesic(s), or anxiolytics, the patient was informed of the treatment  options, risks and possible complications. To fulfill our ethical and legal obligations, as recommended by the American Medical Association's Code of Ethics, I have informed the patient of my clinical impression; the nature and purpose of the treatment or procedure; the risks, benefits, and possible complications of the intervention; the alternatives, including doing nothing;  the risk(s) and benefit(s) of the alternative treatment(s) or procedure(s); and the risk(s) and benefit(s) of doing nothing. The patient was provided information about the general risks and possible complications associated with the procedure. These may include, but are not limited to: failure to achieve desired goals, infection, bleeding, organ or nerve damage, allergic reactions, paralysis, and death. In addition, the patient was informed of those risks and complications associated to Spine-related procedures, such as failure to decrease pain; infection (i.e.: Meningitis, epidural or intraspinal abscess); bleeding (i.e.: epidural hematoma, subarachnoid hemorrhage, or any other type of intraspinal or peri-dural bleeding); organ or nerve damage (i.e.: Any type of peripheral nerve, nerve root, or spinal cord injury) with subsequent damage to sensory, motor, and/or autonomic systems, resulting in permanent pain, numbness, and/or weakness of one or several areas of the body; allergic reactions; (i.e.: anaphylactic reaction); and/or death. Furthermore, the patient was informed of those risks and complications associated with the medications. These include, but are not limited to: allergic reactions (i.e.: anaphylactic or anaphylactoid reaction(s)); adrenal axis suppression; blood sugar elevation that in diabetics may result in ketoacidosis or comma; water retention that in patients with history of congestive heart failure may result in shortness of breath, pulmonary edema, and decompensation with resultant heart failure; weight gain; swelling or  edema; medication-induced neural toxicity; particulate matter embolism and blood vessel occlusion with resultant organ, and/or nervous system infarction; and/or aseptic necrosis of one or more joints. Finally, the patient was informed that Medicine is not an exact science; therefore, there is also the possibility of unforeseen or unpredictable risks and/or possible complications that may result in a catastrophic outcome. The patient indicated having understood very clearly. We have given the patient no guarantees and we have made no promises. Enough time was given to the patient to ask questions, all of which were answered to the patient's satisfaction. Ms. Litchford has indicated that she wanted to continue with the procedure. Attestation: I, the ordering provider, attest that I have discussed with the patient the benefits, risks, side-effects, alternatives, likelihood of achieving goals, and potential problems during recovery for the procedure that I have provided informed consent. Date: 08/18/2016; Time: 10:54 AM  Pre-Procedure Preparation:  Monitoring: As per clinic protocol. Respiration, ETCO2, SpO2, BP, heart rate and rhythm monitor placed and checked for adequate function Safety Precautions: Patient was assessed for positional comfort and pressure points before starting the procedure. Time-out: I initiated and conducted the "Time-out" before starting the procedure, as per protocol. The patient was asked to participate by confirming the accuracy of the "Time Out" information. Verification of the correct person, site, and procedure were performed and confirmed by me, the nursing staff, and the patient. "Time-out" conducted as per Joint Commission's Universal Protocol (UP.01.01.01). "Time-out" Date & Time: 08/18/2016; 1133 hrs.  Description of Procedure Process:   Position: Prone with head of the table was raised to facilitate breathing. Target Area: For Cervical Facet block(s), the target is the  postero-lateral waist of the articular pillars at the C3, C4, C5, C6, & C7 levels. Approach: Posterior approach. Area Prepped: Entire Posterior Cervical Region Prepping solution: Hibiclens (4.0% Chlorhexidine gluconate solution) Safety Precautions: Aspiration looking for blood return was conducted prior to all injections. At no point did we inject any substances, as a needle was being advanced. Safe injection practices and needle disposal techniques used. Medications properly checked for expiration dates. SDV (single dose vial) medications used. Description of the Procedure: Protocol guidelines were followed. The patient was placed in position over the  fluoroscopy table. The target area was identified and the area prepped in the usual manner. Skin & deeper tissues infiltrated with local anesthetic. Appropriate amount of time allowed to pass for local anesthetics to take effect. The Radiofrequency needles were used to reach the area of the medial branch at the waist of the articular pillars using fluoroscopy. Using the [employer], sensory stimulation using 50 Hz was used to locate & identify the nerve, making sure that the needle was positioned such that there was no sensory stimulation below 0.3 V or above 0.7 V. Stimulation using 2 Hz was used to evaluate the motor component. Care was taken not to lesion any nerves that demonstrated motor stimulation of the lower extremities at an output of less than 2.5 times that of the sensory threshold, or a maximum of 2.0 V. Once satisfactory placement of the needles was achieved, the below described solution was slowly injected after negative aspiration. After waiting for at least 2 minutes, the ablation was performed at 80 degrees C for 60 seconds. The needles were then removed and the area cleansed, making sure to leave some of the prepping solution back to take advantage of its long term bactericidal properties. Vitals:   08/18/16 1211 08/18/16  1221 08/18/16 1231 08/18/16 1241  BP: (!) 102/53 (!) 116/53 (!) 114/59 126/81  Pulse: 64 66 (!) 59 (!) 56  Resp: 10 (!) 24 15 12   Temp:  97.1 F (36.2 C)    TempSrc:      SpO2: 90% 97% 97% 99%  Weight:      Height:        Start Time: 1133 hrs. End Time:   hrs. Materials & Medications:  Needle(s) Type: Teflon-coated, curved tip, Radiofrequency needle(s) Gauge: 22G Length: 10cm Medication(s): We administered midazolam, fentaNYL, and ropivacaine (PF) 2 mg/mL (0.2%). Please see chart orders for dosing details.  Imaging Guidance (Spinal):  Type of Imaging Technique: Fluoroscopy Guidance (Spinal) Indication(s): Assistance in needle guidance and placement for procedures requiring needle placement in or near specific anatomical locations not easily accessible without such assistance. Exposure Time: Please see nurses notes. Contrast: None used. Fluoroscopic Guidance: I was personally present during the use of fluoroscopy. "Tunnel Vision Technique" used to obtain the best possible view of the target area. Parallax error corrected before commencing the procedure. "Direction-depth-direction" technique used to introduce the needle under continuous pulsed fluoroscopy. Once target was reached, antero-posterior, oblique, and lateral fluoroscopic projection used confirm needle placement in all planes. Images permanently stored in EMR. Interpretation: No contrast injected. I personally interpreted the imaging intraoperatively. Adequate needle placement confirmed in multiple planes. Permanent images saved into the patient's record.  Antibiotic Prophylaxis:  Indication(s): None identified Antibiotic given: None  Post-operative Assessment:  EBL: None Complications: No immediate post-treatment complications observed by team, or reported by patient. Note: The patient tolerated the entire procedure well. A repeat set of vitals were taken after the procedure and the patient was kept under observation  following institutional policy, for this type of procedure. Post-procedural neurological assessment was performed, showing return to baseline, prior to discharge. The patient was provided with post-procedure discharge instructions, including a section on how to identify potential problems. Should any problems arise concerning this procedure, the patient was given instructions to immediately contact us, at any time, without hesitation. In any case, we plan to contact the patient by telephone for a follow-up status report regarding this interventional procedure. Comments:  No additional relevant information.  Plan of Care  Disposition: Discharge  home  Discharge Date & Time: 08/18/2016; 1249 hrs.  Physician-requested Follow-up:  Return in about 6 weeks (around 09/29/2016) for post-RF eval, (6 wks), w/ MD.  Future Appointments Date Time Provider [place]  09/30/2016 2:15 PM Milinda Pointer, MD [facility] None  10/30/2016 11:15 AM Eustace Pen, LPN [facility]  11/06/2016 11:30 AM Ria Bush, MD [facility]  01/29/2017 12:45 PM Vevelyn Francois, NP [facility] None   Medications ordered for procedure: Meds ordered this encounter  Medications  . lactated ringers infusion 1,000 mL  . midazolam (VERSED) 5 MG/5ML injection 1-2 mg    Make sure Flumazenil is available in the pyxis when using this medication. If oversedation occurs, administer 0.2 mg IV over 15 sec. If after 45 sec no response, administer 0.2 mg again over 1 min; may repeat at 1 min intervals; not to exceed 4 doses (1 mg)  . fentaNYL (SUBLIMAZE) injection 25-50 mcg    Make sure Narcan is available in the pyxis when using this medication. In the event of respiratory depression (RR< 8/min): Titrate NARCAN (naloxone) in increments of 0.1 to 0.2 mg IV at 2-3 minute intervals, until desired degree of reversal.  . dexamethasone (DECADRON) injection 10 mg  . lidocaine (PF) (XYLOCAINE) 1 % injection 10 mL   . ropivacaine (PF) 2 mg/mL (0.2%) (NAROPIN) injection 9 mL  . HYDROcodone-acetaminophen (NORCO/VICODIN) 5-325 MG tablet    Sig: Take 1 tablet by mouth every 8 (eight) hours as needed for severe pain.    Dispense:  30 tablet    Refill:  0    For acute post-operative pain. Not to be refilled. To last until:   Medications administered: We administered midazolam, fentaNYL, and ropivacaine (PF) 2 mg/mL (0.2%).  See the medical record for exact dosing, route, and time of administration.  Lab-work, Procedure(s), & Referral(s) Ordered: Orders Placed This Encounter  Procedures  . DG C-Arm 1-60 Min-No Report  . Informed Consent Details: Transcribe to consent form and obtain patient signature  . Provider attestation of informed consent for procedure/surgical case  . Verify informed consent  . Discharge instructions  . Follow-up   Imaging Ordered: Results for orders placed in visit on 07/02/16  DG C-Arm 1-60 Min-No Report   Narrative Fluoroscopy was utilized by the requesting physician.  No radiographic  interpretation.    New Prescriptions   HYDROCODONE-ACETAMINOPHEN (NORCO/VICODIN) 5-325 MG TABLET    Take 1 tablet by mouth every 8 (eight) hours as needed for severe pain.   Primary Care Physician: Ria Bush, MD Location: [facility] Note by: Kathlen Brunswick. [name], M.D, DABA, DABAPM, DABPM, DABIPP, FIPP Date: 08/18/2016; Time: 5:29 PM  Disclaimer:  Medicine is not an Chief Strategy Officer. The only guarantee in medicine is that nothing is guaranteed. It is important to note that the decision to proceed with this intervention was based on the information collected from the patient. The Data and conclusions were drawn from the patient's questionnaire, the interview, and the physical examination. Because the information was provided in large part by the patient, it cannot be guaranteed that it has not been purposely or unconsciously manipulated. Every effort has been  made to obtain as much relevant data as possible for this evaluation. It is important to note that the conclusions that lead to this procedure are derived in large part from the available data. Always take into account that the treatment will also be dependent on availability of resources and existing treatment guidelines, considered by other Pain Management  Practitioners as being common knowledge and practice, at the time of the intervention. For Medico-Legal purposes, it is also important to point out that variation in procedural techniques and pharmacological choices are the acceptable norm. The indications, contraindications, technique, and results of the above procedure should only be interpreted and judged by a Board-Certified Interventional Pain Specialist with extensive familiarity and expertise in the same exact procedure and technique.  Instructions provided at this appointment: Patient Instructions   Post-Procedure instructions Instructions:  Apply ice: Fill a plastic sandwich bag with crushed ice. Cover it with a small towel and apply to injection site. Apply for 15 minutes then remove x 15 minutes. Repeat sequence on day of procedure, until you go to bed. The purpose is to minimize swelling and discomfort after procedure.  Apply heat: Apply heat to procedure site starting the day following the procedure. The purpose is to treat any soreness and discomfort from the procedure.  Food intake: Start with clear liquids (like water) and advance to regular food, as tolerated.   Physical activities: Keep activities to a minimum for the first 8 hours after the procedure.   Driving: If you have received any sedation, you are not allowed to drive for 24 hours after your procedure.  Blood thinner: Restart your blood thinner 6 hours after your procedure. (Only for those taking blood thinners)  Insulin: As soon as you can eat, you may resume your normal dosing schedule. (Only for those taking  insulin)  Infection prevention: Keep procedure site clean and dry.  Post-procedure Pain Diary: Extremely important that this be done correctly and accurately. Recorded information will be used to determine the next step in treatment.  Pain evaluated is that of treated area only. Do not include pain from an untreated area.  Complete every hour, on the hour, for the initial 8 hours. Set an alarm to help you do this part accurately.  Do not go to sleep and have it completed later. It will not be accurate.  Follow-up appointment: Keep your follow-up appointment after the procedure. Usually 2 weeks for most procedures. (6 weeks in the case of radiofrequency.) Bring you pain diary.  Expect:  From numbing medicine (AKA: Local Anesthetics): Numbness or decrease in pain.  Onset: Full effect within 15 minutes of injected.  Duration: It will depend on the type of local anesthetic used. On the average, 1 to 8 hours.   From steroids: Decrease in swelling or inflammation. Once inflammation is improved, relief of the pain will follow.  Onset of benefits: Depends on the amount of swelling present. The more swelling, the longer it will take for the benefits to be seen.   Duration: Steroids will stay in the system x 2 weeks. Duration of benefits will depend on multiple posibilities including persistent irritating factors.  From procedure: Some discomfort is to be expected once the numbing medicine wears off. This should be minimal if ice and heat are applied as instructed. Call if:  You experience numbness and weakness that gets worse with time, as opposed to wearing off.  New onset bowel or bladder incontinence. (Spinal procedures only)  Emergency Numbers:  [place] business hours (Monday - Thursday, 8:00 AM - 4:00 PM) (Friday, 9:00 AM - 12:00 Noon): [phone]  After hours: [phone]  [phone] _____________________________________________________________________________________________  Radiofrequency Lesioning, Care After Refer to this sheet in the next few weeks. These instructions provide you with information about caring for yourself after your procedure. Your health care provider may also give you more  specific instructions. Your treatment has been planned according to current medical practices, but problems sometimes occur. Call your health care provider if you have any problems or questions after your procedure. What can I expect after the procedure? After the procedure, it is common to have:  Pain from the burned nerve.  Temporary numbness. Follow these instructions at home:  Take over-the-counter and prescription medicines only as told by your health care provider.  Return to your normal activities as told by your health care provider. Ask your health care provider what activities are safe for you.  Pay close attention to how you feel after the procedure. If you start to have pain, write down when it hurts and how it feels. This will help you and your health care provider to know if you need an additional treatment.  Check your needle insertion site every day for signs of infection. Watch for:  Redness, swelling, or pain.  Fluid, blood, or pus.  Keep all follow-up visits as told by your health care provider. This is important. Contact a health care provider if:  Your pain does not get better.  You have redness, swelling, or pain at the needle insertion site.  You have fluid, blood, or pus coming from the needle insertion site.  You have a fever. Get help right away if:  You develop sudden, severe pain.  You develop numbness or tingling near the procedure site that does not go away. This information is not intended to replace advice given to you by your health care provider. Make sure you discuss any questions you have with your health care  provider. Document Released: 11/21/2010 Document Revised: 08/30/2015 Document Reviewed: 05/01/2014 Elsevier Interactive Patient Education  2017 [place]. Radiofrequency Lesioning Radiofrequency lesioning is a procedure that is performed to relieve pain. The procedure is often used for back, neck, or arm pain. Radiofrequency lesioning involves the use of a machine that creates radio waves to make heat. During the procedure, the heat is applied to the nerve that carries the pain signal. The heat damages the nerve and interferes with the pain signal. Pain relief usually starts about 2 weeks after the procedure and lasts for 6 months to 1 year. Tell a health care provider about:  Any allergies you have.  All medicines you are taking, including vitamins, herbs, eye drops, creams, and over-the-counter medicines.  Any problems you or family members have had with anesthetic medicines.  Any blood disorders you have.  Any surgeries you have had.  Any medical conditions you have.  Whether you are pregnant or may be pregnant. What are the risks? Generally, this is a safe procedure. However, problems may occur, including:  Pain or soreness at the injection site.  Infection at the injection site.  Damage to nerves or blood vessels. What happens before the procedure?  Ask your health care provider about:  Changing or stopping your regular medicines. This is especially important if you are taking diabetes medicines or blood thinners.  Taking medicines such as aspirin and ibuprofen. These medicines can thin your blood. Do not take these medicines before your procedure if your health care provider instructs you not to.  Follow instructions from your health care provider about eating or drinking restrictions.  Plan to have someone take you home after the procedure.  If you go home right after the procedure, plan to have someone with you for 24 hours. What happens during the  procedure?  You will be given one or more of  the following:  A medicine to help you relax (sedative).  A medicine to numb the area (local anesthetic).  You will be awake during the procedure. You will need to be able to talk with the health care provider during the procedure.  With the help of a type of X-ray (fluoroscopy), the health care provider will insert a radiofrequency needle into the area to be treated.  Next, a wire that carries the radio waves (electrode) will be put through the radiofrequency needle. An electrical pulse will be sent through the electrode to verify the correct nerve. You will feel a tingling sensation, and you may have muscle twitching.  Then, the tissue that is around the needle tip will be heated by an electric current that is passed using the radiofrequency machine. This will numb the nerves.  A bandage (dressing) will be put on the insertion area after the procedure is done. The procedure may vary among health care providers and hospitals. What happens after the procedure?  Your blood pressure, heart rate, breathing rate, and blood oxygen level will be monitored often until the medicines you were given have worn off.  Return to your normal activities as directed by your health care provider. This information is not intended to replace advice given to you by your health care provider. Make sure you discuss any questions you have with your health care provider. Document Released: 11/20/2010 Document Revised: 08/30/2015 Document Reviewed: 05/01/2014 Elsevier Interactive Patient Education  2017 [place]. Pain Management Discharge Instructions  General Discharge Instructions :  If you need to reach your doctor call: Monday-Friday 8:00 am - 4:00 pm at [phone] or toll free [phone].  After clinic hours [phone] to have operator reach doctor.  Bring all of your medication bottles to all your appointments in the pain clinic.  To cancel or  reschedule your appointment with Pain Management please remember to call 24 hours in advance to avoid a fee.  Refer to the educational materials which you have been given on: General Risks, I had my Procedure. Discharge Instructions, Post Sedation.  Post Procedure Instructions:  The drugs you were given will stay in your system until tomorrow, so for the next 24 hours you should not drive, make any legal decisions or drink any alcoholic beverages.  You may eat anything you prefer, but it is better to start with liquids then soups and crackers, and gradually work up to solid foods.  Please notify your doctor immediately if you have any unusual bleeding, trouble breathing or pain that is not related to your normal pain.  Depending on the type of procedure that was done, some parts of your body may feel week and/or numb.  This usually clears up by tonight or the next day.  Walk with the use of an assistive device or accompanied by an adult for the 24 hours.  You may use ice on the affected area for the first 24 hours.  Put ice in a Ziploc bag and cover with a towel and place against area 15 minutes on 15 minutes off.  You may switch to heat after 24 hours.GENERAL RISKS AND COMPLICATIONS  What are the risk, side effects and possible complications? Generally speaking, most procedures are safe.  However, with any procedure there are risks, side effects, and the possibility of complications.  The risks and complications are dependent upon the sites that are lesioned, or the type of nerve block to be performed.  The closer the procedure is to the spine,  the more serious the risks are.  Great care is taken when placing the radio frequency needles, block needles or lesioning probes, but sometimes complications can occur. 1. Infection: Any time there is an injection through the skin, there is a risk of infection.  This is why sterile conditions are used for these blocks.  There are four possible types of  infection. 1. Localized skin infection. 2. Central Nervous System Infection-This can be in the form of Meningitis, which can be deadly. 3. Epidural Infections-This can be in the form of an epidural abscess, which can cause pressure inside of the spine, causing compression of the spinal cord with subsequent paralysis. This would require an emergency surgery to decompress, and there are no guarantees that the patient would recover from the paralysis. 4. Discitis-This is an infection of the intervertebral discs.  It occurs in about 1% of discography procedures.  It is difficult to treat and it may lead to surgery.        2. Pain: the needles have to go through skin and soft tissues, will cause soreness.       3. Damage to internal structures:  The nerves to be lesioned may be near blood vessels or    other nerves which can be potentially damaged.       4. Bleeding: Bleeding is more common if the patient is taking blood thinners such as  aspirin, Coumadin, Ticiid, Plavix, etc., or if he/she have some genetic predisposition  such as hemophilia. Bleeding into the spinal canal can cause compression of the spinal  cord with subsequent paralysis.  This would require an emergency surgery to  decompress and there are no guarantees that the patient would recover from the  paralysis.       5. Pneumothorax:  Puncturing of a lung is a possibility, every time a needle is introduced in  the area of the chest or upper back.  Pneumothorax refers to free air around the  collapsed lung(s), inside of the thoracic cavity (chest cavity).  Another two possible  complications related to a similar event would include: Hemothorax and Chylothorax.   These are variations of the Pneumothorax, where instead of air around the collapsed  lung(s), you may have blood or chyle, respectively.       6. Spinal headaches: They may occur with any procedures in the area of the spine.       7. Persistent CSF (Cerebro-Spinal Fluid) leakage: This is  a rare problem, but may occur  with prolonged intrathecal or epidural catheters either due to the formation of a fistulous  track or a dural tear.       8. Nerve damage: By working so close to the spinal cord, there is always a possibility of  nerve damage, which could be as serious as a permanent spinal cord injury with  paralysis.       9. Death:  Although rare, severe deadly allergic reactions known as "Anaphylactic  reaction" can occur to any of the medications used.      10. Worsening of the symptoms:  We can always make thing worse.  What are the chances of something like this happening? Chances of any of this occuring are extremely low.  By statistics, you have more of a chance of getting killed in a motor vehicle accident: while driving to the hospital than any of the above occurring .  Nevertheless, you should be aware that they are possibilities.  In general, it is similar to  taking a shower.  Everybody knows that you can slip, hit your head and get killed.  Does that mean that you should not shower again?  Nevertheless always keep in mind that statistics do not mean anything if you happen to be on the wrong side of them.  Even if a procedure has a 1 (one) in a 1,000,000 (million) chance of going wrong, it you happen to be that one..Also, keep in mind that by statistics, you have more of a chance of having something go wrong when taking medications.  Who should not have this procedure? If you are on a blood thinning medication (e.g. Coumadin, Plavix, see list of "Blood Thinners"), or if you have an active infection going on, you should not have the procedure.  If you are taking any blood thinners, please inform your physician.  How should I prepare for this procedure?  Do not eat or drink anything at least six hours prior to the procedure.  Bring a driver with you .  It cannot be a taxi.  Come accompanied by an adult that can drive you back, and that is strong enough to help you if your  legs get weak or numb from the local anesthetic.  Take all of your medicines the morning of the procedure with just enough water to swallow them.  If you have diabetes, make sure that you are scheduled to have your procedure done first thing in the morning, whenever possible.  If you have diabetes, take only half of your insulin dose and notify our nurse that you have done so as soon as you arrive at the clinic.  If you are diabetic, but only take blood sugar pills (oral hypoglycemic), then do not take them on the morning of your procedure.  You may take them after you have had the procedure.  Do not take aspirin or any aspirin-containing medications, at least eleven (11) days prior to the procedure.  They may prolong bleeding.  Wear loose fitting clothing that may be easy to take off and that you would not mind if it got stained with Betadine or blood.  Do not wear any jewelry or perfume  Remove any nail coloring.  It will interfere with some of our monitoring equipment.  NOTE: Remember that this is not meant to be interpreted as a complete list of all possible complications.  Unforeseen problems may occur.  BLOOD THINNERS The following drugs contain aspirin or other products, which can cause increased bleeding during surgery and should not be taken for 2 weeks prior to and 1 week after surgery.  If you should need take something for relief of minor pain, you may take acetaminophen which is found in Tylenol,m Datril, Anacin-3 and Panadol. It is not blood thinner. The products listed below are.  Do not take any of the products listed below in addition to any listed on your instruction sheet.  A.P.C or A.P.C with Codeine Codeine Phosphate Capsules #3 Ibuprofen Ridaura  ABC compound Congesprin Imuran rimadil  Advil Cope Indocin Robaxisal  Alka-Seltzer Effervescent Pain Reliever and Antacid Coricidin or Coricidin-D  Indomethacin Rufen  Alka-Seltzer plus Cold Medicine Cosprin Ketoprofen S-A-C  Tablets  Anacin Analgesic Tablets or Capsules Coumadin Korlgesic Salflex  Anacin Extra Strength Analgesic tablets or capsules CP-2 Tablets Lanoril Salicylate  Anaprox Cuprimine Capsules Levenox Salocol  Anexsia-D Dalteparin Magan Salsalate  Anodynos Darvon compound Magnesium Salicylate Sine-off  Ansaid Dasin Capsules Magsal Sodium Salicylate  Anturane Depen Capsules Marnal Soma  APF Arthritis pain  formula Dewitt's Pills Measurin Stanback  Argesic Dia-Gesic Meclofenamic Sulfinpyrazone  Arthritis Bayer Timed Release Aspirin Diclofenac Meclomen Sulindac  Arthritis pain formula Anacin Dicumarol Medipren Supac  Analgesic (Safety coated) Arthralgen Diffunasal Mefanamic Suprofen  Arthritis Strength Bufferin Dihydrocodeine Mepro Compound Suprol  Arthropan liquid Dopirydamole Methcarbomol with Aspirin Synalgos  ASA tablets/Enseals Disalcid Micrainin Tagament  Ascriptin Doan's Midol Talwin  Ascriptin A/D Dolene Mobidin Tanderil  Ascriptin Extra Strength Dolobid Moblgesic Ticlid  Ascriptin with Codeine Doloprin or Doloprin with Codeine Momentum Tolectin  Asperbuf Duoprin Mono-gesic Trendar  Aspergum Duradyne Motrin or Motrin IB Triminicin  Aspirin plain, buffered or enteric coated Durasal Myochrisine Trigesic  Aspirin Suppositories Easprin Nalfon Trillsate  Aspirin with Codeine Ecotrin Regular or Extra Strength Naprosyn Uracel  Atromid-S Efficin Naproxen Ursinus  Auranofin Capsules Elmiron Neocylate Vanquish  Axotal Emagrin Norgesic Verin  Azathioprine Empirin or Empirin with Codeine Normiflo Vitamin E  Azolid Emprazil Nuprin Voltaren  Bayer Aspirin plain, buffered or children's or timed BC Tablets or powders Encaprin Orgaran Warfarin Sodium  Buff-a-Comp Enoxaparin Orudis Zorpin  Buff-a-Comp with Codeine Equegesic Os-Cal-Gesic   Buffaprin Excedrin plain, buffered or Extra Strength Oxalid   Bufferin Arthritis Strength Feldene Oxphenbutazone   Bufferin plain or Extra Strength Feldene Capsules  Oxycodone with Aspirin   Bufferin with Codeine Fenoprofen Fenoprofen Pabalate or Pabalate-SF   Buffets II Flogesic Panagesic   Buffinol plain or Extra Strength Florinal or Florinal with Codeine Panwarfarin   Buf-Tabs Flurbiprofen Penicillamine   Butalbital Compound Four-way cold tablets Penicillin   Butazolidin Fragmin Pepto-Bismol   Carbenicillin Geminisyn Percodan   Carna Arthritis Reliever Geopen Persantine   Carprofen Gold's salt Persistin   Chloramphenicol Goody's Phenylbutazone   Chloromycetin Haltrain Piroxlcam   Clmetidine heparin Plaquenil   Cllnoril Hyco-pap Ponstel   Clofibrate Hydroxy chloroquine Propoxyphen         Before stopping any of these medications, be sure to consult the physician who ordered them.  Some, such as Coumadin (Warfarin) are ordered to prevent or treat serious conditions such as "deep thrombosis", "pumonary embolisms", and other heart problems.  The amount of time that you may need off of the medication may also vary with the medication and the reason for which you were taking it.  If you are taking any of these medications, please make sure you notify your pain physician before you undergo any procedures.

## 2016-08-19 ENCOUNTER — Telehealth: Payer: Self-pay | Admitting: *Deleted

## 2016-08-19 NOTE — Telephone Encounter (Signed)
Denies complications post RF yesterday.

## 2016-08-21 ENCOUNTER — Ambulatory Visit: Payer: Medicare Other | Admitting: Pain Medicine

## 2016-09-29 ENCOUNTER — Ambulatory Visit: Payer: Medicare Other | Admitting: Pain Medicine

## 2016-09-30 ENCOUNTER — Ambulatory Visit: Payer: Medicare Other | Admitting: Pain Medicine

## 2016-10-06 NOTE — Progress Notes (Signed)
Patient's Name: Cheyenne Pierce  MRN: [id number]  Referring Provider: Ria Bush, MD  DOB: 1934-08-20  PCP: Ria Bush, MD  DOS: 10/07/2016  Note by: Kathlen Brunswick. [name], MD  Service setting: Ambulatory outpatient  Specialty: Interventional Pain Management  Location: [facility] (AMB) Pain Management Facility    Patient type: Established   Primary Reason(s) for Visit: Encounter for post-procedure evaluation of chronic illness with mild to moderate exacerbation CC: Back Pain (lower)  HPI  Cheyenne Pierce is a 82 y.o. year old, female patient, who comes today for a post-procedure evaluation. She has Hypothyroidism; GAD (generalized anxiety disorder); Hereditary and idiopathic peripheral neuropathy; Essential hypertension; Reflux esophagitis; GERD; INGUINAL HERNIA, LEFT; Irritable bowel syndrome; Other chronic cystitis; Osteopenia; Systolic murmur; Medicare annual wellness visit, subsequent; PVCs (premature ventricular contractions); Prediabetes; Memory deficit; Advanced care planning/counseling discussion; HLD (hyperlipidemia); Health maintenance examination; Recurrent falls; Night sweats; Chronic pain syndrome; Encounter for therapeutic drug level monitoring; Encounter for chronic pain management; Chronic midline low back pain with left-sided sciatica; Chronic neck pain (Location of Primary Source of Pain) (Bilateral) (R>L); Cervical spondylosis; Chronic cervical radicular pain (Bilateral) (R>L); Numbness of upper extremity (Bilateral) (R>L); Upper extremity weakness (Bilateral); Neurogenic pain; Neuropathic pain; Inflammatory pain; Chronic Lumbar Radicular pain (Location of Secondary source of pain) (S1) (Left); Allodynia; Encounter for long-term current use of medication; Lumbar spondylosis; Chronic Lumbar Radiculopathy (Left); Chronic lower extremity pain (Bilateral) (L>R); Chronic hip pain (Location of Tertiary source of pain) (Right); Abnormal Cervical MRI (01/24/15); Cervical spinal stenosis (C3  through C7); Cervical facet hypertrophy (Bilateral); Cervical foraminal stenosis (C7-T1) (Bilateral); Chronic cervical radiculopathy (Bilateral) (R>L) (pain, numbness, and weakness); Abnormal Lumbar MRI (11/10/2014); Lumbar spinal stenosis (L3-4); Lumbar foraminal stenosis (Bilateral L3-4) (Right L4-5); MDD (major depressive disorder), recurrent episode, moderate ([place]); Fibromyalgia; Neck pain on right side; Cervical facet syndrome (Right); Cervical fusion syndrome (C3-C7 ACDF); Primary osteoarthritis involving multiple joints; Acute postoperative pain; Long term (current) use of opiate analgesic; Long term prescription opiate use; and Opiate use on her problem list. Her primarily concern today is the Back Pain (lower)  Pain Assessment: Location: Lower Back Radiating: both legs, down to the feet Onset: More than a month ago Duration: Chronic pain Quality: Constant, Burning, Aching (burning in left leg) Severity: 1 /10 (self-reported pain score)  Note: Reported level is compatible with observation.                   Effect on ADL:   Timing: Constant Modifying factors: elevation of the legs  Cheyenne Pierce comes in today for post-procedure evaluation after the treatment done on 08/18/2016. According to the patient she has obtained excellent relief of the neck pain from the radiofrequency. She now has 95% relief of the neck pain. The only thing that persisted is some numbness in both upper extremities. This did improve with the cervical epidural, but was unchanged with the radiofrequency, as expected. She still taking the Lyrica and today we have completed some paperwork for her so that she can get it at a lower cost. In addition to this the patient continues to have bilateral lower extremity pain and she is asking me if this has to do something with the pain. It is my impression that this is more likely to be due to either kidney problems or cardiovascular problems. She admits to having some congestive heart  failure and she is taking some diuretics. She has indicated that she is pending to go back to her primary care physician to have this reevaluated. Today  we have decided to do a prn left-sided cervical epidural steroid injection under fluoroscopic guidance, with or without sedation. The patient was instructed to give [place] a call if the pain, numbness, or weakness worsens.  Further details on both, my assessment(s), as well as the proposed treatment plan, please see below.  Post-Procedure Assessment  08/18/2016 Procedure: Therapeutic right-sided cervical facet RFA under fluoroscopic guidance and IV sedation Pre-procedure pain score:  2/10 Post-procedure pain score: 0/10 (100% relief) Influential Factors: BMI: 27.15 kg/m Intra-procedural challenges: None observed Assessment challenges: None detected         Post-procedural adverse reactions or complications: None reported Reported side-effects: None  Sedation: Sedation provided. When no sedatives are used, the analgesic levels obtained are directly associated to the effectiveness of the local anesthetics. However, when sedation is provided, the level of analgesia obtained during the initial 1 hour following the intervention, is believed to be the result of a combination of factors. These factors may include, but are not limited to: 1. The effectiveness of the local anesthetics used. 2. The effects of the analgesic(s) and/or anxiolytic(s) used. 3. The degree of discomfort experienced by the patient at the time of the procedure. 4. The patients ability and reliability in recalling and recording the events. 5. The presence and influence of possible secondary gains and/or psychosocial factors. Reported result: Relief experienced during the 1st hour after the procedure: 100 % (Ultra-Short Term Relief) Interpretative annotation: Analgesia during this period is likely to be Local Anesthetic and/or IV Sedative (Analgesic/Anxiolitic) related.           Effects of local anesthetic: The analgesic effects attained during this period are directly associated to the localized infiltration of local anesthetics and therefore cary significant diagnostic value as to the etiological location, or anatomical origin, of the pain. Expected duration of relief is directly dependent on the pharmacodynamics of the local anesthetic used. Long-acting (4-6 hours) anesthetics used.  Reported result: Relief during the next 4 to 6 hour after the procedure: 0 % (Short-Term Relief) Interpretative annotation: Complete relief would suggest area to be the source of the pain.          Long-term benefit: Defined as the period of time past the expected duration of local anesthetics. With the possible exception of prolonged sympathetic blockade from the local anesthetics, benefits during this period are typically attributed to, or associated with, other factors such as analgesic sensory neuropraxia, antiinflammatory effects, or beneficial biochemical changes provided by agents other than the local anesthetics Reported result: Extended relief following procedure: 95 % (Long-Term Relief) Interpretative annotation: Good relief. This could suggest inflammation to be a significant component in the etiology to the pain.          Current benefits: Defined as persistent relief that continues at this point in time.   Reported results: Treated area: 95 %       Interpretative annotation: Ongoing benefits would suggest effective therapeutic approach  Interpretation: Results would suggest a successful diagnostic intervention.          Laboratory Chemistry  Inflammation Markers (CRP: Acute Phase) (ESR: Chronic Phase) Lab Results  Component Value Date   CRP 0.7 09/12/2014   ESRSEDRATE 11 01/07/2016                 Renal Function Markers Lab Results  Component Value Date   BUN 13 06/05/2016   CREATININE 0.85 06/05/2016   GFRAA >60 05/22/2015   GFRNONAA >60 05/22/2015  Hepatic Function Markers Lab Results  Component Value Date   AST 27 02/07/2015   ALT 21 02/07/2015   ALBUMIN 4.0 02/07/2015   ALKPHOS 55 02/07/2015                 Electrolytes Lab Results  Component Value Date   NA 136 06/05/2016   K 3.8 06/05/2016   CL 99 06/05/2016   CALCIUM 9.6 06/05/2016                 Neuropathy Markers Lab Results  Component Value Date   VITAMINB12 >1500 (H) 09/22/2012                 Bone Pathology Markers Lab Results  Component Value Date   ALKPHOS 55 02/07/2015   VD25OH 47.39 10/04/2013   CALCIUM 9.6 06/05/2016                 Coagulation Parameters Lab Results  Component Value Date   INR 1.0 11/08/2012   LABPROT 13.8 11/08/2012   APTT 32.6 11/08/2012   PLT 337.0 01/07/2016                 Cardiovascular Markers Lab Results  Component Value Date   HGB 13.8 01/07/2016   HCT 40.9 01/07/2016                 Note: Lab results reviewed.  Recent Diagnostic Imaging Review  Dg C-arm 1-60 Min-no Report  Result Date: 08/18/2016 Fluoroscopy was utilized by the requesting physician.  No radiographic interpretation.   Note: Imaging results reviewed.          Meds   Current Meds  Medication Sig  . acetaminophen (TYLENOL) 500 MG tablet Take 500 mg by mouth every 6 (six) hours as needed.  Marland Kitchen amLODipine (NORVASC) 2.5 MG tablet Take 1 tablet (2.5 mg total) by mouth daily.  Marland Kitchen aspirin 325 MG tablet Take 325 mg by mouth daily.  . ASTRAGALUS PO Take 500 mg by mouth daily.  . Biotin 1000 MCG tablet Take 1,000 mcg by mouth daily.  . calcium-vitamin D (OSCAL WITH D) 500-200 MG-UNIT tablet Take 1 tablet by mouth.  . diphenhydrAMINE (BENADRYL) 25 mg capsule Take 25 mg by mouth at bedtime.   Marland Kitchen HYDROcodone-acetaminophen (NORCO/VICODIN) 5-325 MG tablet Take 0.5 tablets by mouth daily.  Derrill Memo ON 11/06/2016] HYDROcodone-acetaminophen (NORCO/VICODIN) 5-325 MG tablet Take 0.5 tablets by mouth daily.  Derrill Memo ON 12/06/2016] HYDROcodone-acetaminophen  (NORCO/VICODIN) 5-325 MG tablet Take 0.5 tablets by mouth daily.  . hyoscyamine (LEVSIN, ANASPAZ) 0.125 MG tablet Take 1 tablet (0.125 mg total) by mouth 2 (two) times daily as needed.  Marland Kitchen levothyroxine (LEVOXYL) 75 MCG tablet Take 75 mcg by mouth daily. Take extra pill on Wednesday and Sunday  . loperamide (IMODIUM) 2 MG capsule Take by mouth as needed for diarrhea or loose stools.  . Magnesium 250 MG TABS Take by mouth daily.  . meclizine (ANTIVERT) 25 MG tablet take 1 tablet by mouth three times a day if needed  . nitroGLYCERIN (NITROSTAT) 0.4 MG SL tablet Place 0.4 mg under the tongue every 5 (five) minutes as needed.  . pantoprazole (PROTONIX) 40 MG tablet Take 1 tablet (40 mg total) by mouth daily.  . pregabalin (LYRICA) 150 MG capsule Take 1 capsule (150 mg total) by mouth 3 (three) times daily.  Marland Kitchen triamterene-hydrochlorothiazide (MAXZIDE-25) 37.5-25 MG tablet take 1 tablet by mouth once daily    ROS  Constitutional: Denies any fever or chills  Gastrointestinal: No reported hemesis, hematochezia, vomiting, or acute GI distress Musculoskeletal: Denies any acute onset joint swelling, redness, loss of ROM, or weakness Neurological: No reported episodes of acute onset apraxia, aphasia, dysarthria, agnosia, amnesia, paralysis, loss of coordination, or loss of consciousness  Allergies  Ms. Uptain is allergic to codeine; penicillins; prednisone; zoloft [sertraline hcl]; amoxicillin; codeine phosphate; lactose intolerance (gi); metronidazole; perindopril erbumine; potassium-containing compounds; prozac [fluoxetine hcl]; tramadol hcl; and oxcarbazepine.  PFSH  Drug: Ms. Gierke  reports that she does not use drugs. Alcohol:  reports that she does not drink alcohol. Tobacco:  reports that she has quit smoking. She has a 30.00 pack-year smoking history. She has never used smokeless tobacco. Medical:  has a past medical history of Allergy; Angina pectoris ([place]); Anxiety; Cataract; Chronic back pain;  DDD (degenerative disc disease), cervical; DDD (degenerative disc disease), cervical; DDD (degenerative disc disease), lumbar; Dental bridge present; Depression; Diverticulosis; GERD (gastroesophageal reflux disease); Headache; Heart murmur; History of bronchitis; History of colon polyps; History of gastric ulcer; History of hiatal hernia; Hypertension; Hypothyroidism; IBS (irritable bowel syndrome); Insomnia; Joint pain; Joint swelling; Mass of vagina; Nocturia; Peripheral neuropathy; Peripheral neuropathy; Urinary frequency; Urinary urgency; and Vertigo. Surgical: Ms. Coalson  has a past surgical history that includes Appendectomy (1948); Abdominal hysterectomy (1968); Ovarian cyst surgery (1963); Hernia repair; Colostomy w/ rectocele repair (06/2004); Ventral hernia repair (08/2008); Colonoscopy (07/2009); epidural steroid injections (multiple latest 05/2014, 07/2014); US ECHOCARDIOGRAPHY (2009); NM MYOVIEW LTD (2009); DEXA (2013); Colonoscopy (06/2014); Tonsillectomy; Cardiac catheterization (09/26/1998); bladder suspension with vaginal sling; epidural infections; Esophagogastroduodenoscopy; Anterior cervical decompression/discectomy fusion 4 level (N/A, 06/05/2015); Cataract extraction w/PHACO (Left, 10/15/2015); Eye surgery (Left, 10/15/2015); and Cataract extraction w/PHACO (Right, 11/26/2015). Family: family history includes Heart disease in her mother; Stroke in her brother.  Constitutional Exam  General appearance: Well nourished, well developed, and well hydrated. In no apparent acute distress Vitals:   10/07/16 1104  BP: 139/84  Pulse: 72  Resp: 16  Temp: 98 F (36.7 C)  TempSrc: Oral  SpO2: 100%  Weight: 139 lb (63 kg)  Height: 5' (1.524 m)   BMI Assessment: Estimated body mass index is 27.15 kg/m as calculated from the following:   Height as of this encounter: 5' (1.524 m).   Weight as of this encounter: 139 lb (63 kg).  BMI interpretation table: BMI level Category Range association  with higher incidence of chronic pain  <18 kg/m2 Underweight   18.5-24.9 kg/m2 Ideal body weight   25-29.9 kg/m2 Overweight Increased incidence by 20%  30-34.9 kg/m2 Obese (Class I) Increased incidence by 68%  35-39.9 kg/m2 Severe obesity (Class II) Increased incidence by 136%  >40 kg/m2 Extreme obesity (Class III) Increased incidence by 254%   BMI Readings from Last 4 Encounters:  10/07/16 27.15 kg/m  08/18/16 26.37 kg/m  07/30/16 27.34 kg/m  07/02/16 26.37 kg/m   Wt Readings from Last 4 Encounters:  10/07/16 139 lb (63 kg)  08/18/16 135 lb (61.2 kg)  07/30/16 140 lb (63.5 kg)  07/02/16 135 lb (61.2 kg)  Psych/Mental status: Alert, oriented x 3 (person, place, & time)       Eyes: PERLA Respiratory: No evidence of acute respiratory distress  Cervical Spine Exam  Inspection: No masses, redness, or swelling Alignment: Symmetrical Functional ROM: Improved after treatment      Stability: No instability detected Muscle strength & Tone: Functionally intact Sensory: Still having some numbness, but the pain is much better. Palpation: No palpable anomalies  Upper Extremity (UE) Exam    Side: Right upper extremity  Side: Left upper extremity  Inspection: No masses, redness, swelling, or asymmetry. No contractures  Inspection: No masses, redness, swelling, or asymmetry. No contractures  Functional ROM: Unrestricted ROM          Functional ROM: Unrestricted ROM          Muscle strength & Tone: Functionally intact  Muscle strength & Tone: Functionally intact  Sensory: Unimpaired  Sensory: Unimpaired  Palpation: No palpable anomalies              Palpation: No palpable anomalies              Specialized Test(s): Deferred         Specialized Test(s): Deferred          Thoracic Spine Exam  Inspection: No masses, redness, or swelling Alignment: Symmetrical Functional ROM: Unrestricted ROM Stability: No instability detected Sensory: Unimpaired Muscle strength & Tone: No  palpable anomalies  Lumbar Spine Exam  Inspection: No masses, redness, or swelling Alignment: Symmetrical Functional ROM: Unrestricted ROM      Stability: No instability detected Muscle strength & Tone: Functionally intact Sensory: Unimpaired Palpation: No palpable anomalies       Provocative Tests: Lumbar Hyperextension and rotation test: evaluation deferred today       Patrick's Maneuver: evaluation deferred today                    Gait & Posture Assessment  Ambulation: Patient ambulates using a walker Gait: Limited. Using assistive device to ambulate Posture: WNL   Lower Extremity Exam    Side: Right lower extremity  Side: Left lower extremity  Inspection: Pitting edema  Inspection: Pitting edema  Functional ROM: Unrestricted ROM          Functional ROM: Unrestricted ROM          Muscle strength & Tone: Functionally intact  Muscle strength & Tone: Functionally intact  Sensory: Unimpaired  Sensory: Unimpaired  Palpation: No palpable anomalies  Palpation: No palpable anomalies   Assessment  Primary Diagnosis & Pertinent Problem List: The primary encounter diagnosis was Chronic cervical radiculopathy (Bilateral) (R>L) (pain, numbness, and weakness). Diagnoses of Cervical foraminal stenosis (C7-T1) (Bilateral), Cervical fusion syndrome (C3-C7 ACDF), Cervical spinal stenosis (C3 through C7), Chronic pain syndrome, Long term (current) use of opiate analgesic, Long term prescription opiate use, and Opiate use were also pertinent to this visit.  Status Diagnosis  Persistent Persistent Persistent 1. Chronic cervical radiculopathy (Bilateral) (R>L) (pain, numbness, and weakness)   2. Cervical foraminal stenosis (C7-T1) (Bilateral)   3. Cervical fusion syndrome (C3-C7 ACDF)   4. Cervical spinal stenosis (C3 through C7)   5. Chronic pain syndrome   6. Long term (current) use of opiate analgesic   7. Long term prescription opiate use   8. Opiate use     Problems updated and reviewed  during this visit: Problem  Opiate Use  Long Term (Current) Use of Opiate Analgesic  Long Term Prescription Opiate Use   Plan of Care  Pharmacotherapy (Medications Ordered): Meds ordered this encounter  Medications  . HYDROcodone-acetaminophen (NORCO/VICODIN) 5-325 MG tablet    Sig: Take 0.5 tablets by mouth daily.    Dispense:  15 tablet    Refill:  0    Fill one day early if pharmacy is closed on scheduled refill date. Do not fill until: 10/07/16 To last until: 11/06/16  . HYDROcodone-acetaminophen (NORCO/VICODIN) 5-325 MG tablet  Sig: Take 0.5 tablets by mouth daily.    Dispense:  15 tablet    Refill:  0    Fill one day early if pharmacy is closed on scheduled refill date. Do not fill until: 11/06/16 To last until: 12/06/16  . HYDROcodone-acetaminophen (NORCO/VICODIN) 5-325 MG tablet    Sig: Take 0.5 tablets by mouth daily.    Dispense:  15 tablet    Refill:  0    Fill one day early if pharmacy is closed on scheduled refill date. Do not fill until: 12/06/16 To last until: 01/05/17   New Prescriptions   No medications on file   Medications administered today: Ms. Zappulla had no medications administered during this visit.  Lab-work, procedure(s), and/or referral(s): Orders Placed This Encounter  Procedures  . Cervical Epidural Injection    Interventional management options: Planned, scheduled, and/or pending:   Not at this time.   Considering:   Right cervical facet block Palliative right cervical epidural steroid injections Palliative left L4-5 lumbar epidural steroid injection   Palliative PRN treatment(s):   Palliative right cervical epidural steroid injections Palliative left L4-5 lumbar epidural steroid injection   Provider-requested follow-up: Return for PRN procedure(s), by MD, in addition, Med-Mgmt, (3 mo), by NP.  Future Appointments Date Time Provider [place]  10/30/2016 11:15 AM Eustace Pen, LPN [facility]  11/06/2016  11:30 AM Ria Bush, MD [facility]  01/29/2017 12:45 PM Vevelyn Francois, NP [facility] None   Primary Care Physician: Ria Bush, MD Location: [facility] Note by: Kathlen Brunswick [name], M.D, DABA, DABAPM, DABPM, DABIPP, FIPP Date: 10/07/2016; Time: 1:16 PM  Patient Instructions   ____________________________________________________________________________________________  Medication Rules  Applies to: All patients receiving prescriptions (written or electronic).  Pharmacy of record: Pharmacy where electronic prescriptions will be sent. If written prescriptions are taken to a different pharmacy, please inform the nursing staff. The pharmacy listed in the electronic medical record should be the one where you would like electronic prescriptions to be sent.  Prescription refills: Only during scheduled appointments. Applies to both, written and electronic prescriptions.  NOTE: The following applies primarily to controlled substances (Opioid* Pain Medications).   Patient's responsibilities: 1. Pain Pills: Bring all pain pills to every appointment (except for procedure appointments). 2. Pill Bottles: Bring pills in original pharmacy bottle. Always bring newest bottle. Bring bottle, even if empty. 3. Medication refills: You are responsible for knowing and keeping track of what medications you need refilled. The day before your appointment, write a list of all prescriptions that need to be refilled. Bring that list to your appointment and give it to the admitting nurse. Prescriptions will be written only during appointments. If you forget a medication, it will not be "Called in", "Faxed", or "electronically sent". You will need to get another appointment to get these prescribed. 4. Prescription Accuracy: You are responsible for carefully inspecting your prescriptions before leaving our office. Have the discharge nurse carefully go over each  prescription with you, before taking them home. Make sure that your name is accurately spelled, that your address is correct. Check the name and dose of your medication to make sure it is accurate. Check the number of pills, and the written instructions to make sure they are clear and accurate. Make sure that you are given enough medication to last until your next medication refill appointment. 5. Taking Medication: Take medication as prescribed. Never take more pills than instructed. Never take medication more frequently than prescribed. Taking less  pills or less frequently is permitted and encouraged, when it comes to controlled substances (written prescriptions).  6. Inform other Doctors: Always inform, all of your healthcare providers, of all the medications you take. 7. Pain Medication from other Providers: You are not allowed to accept any additional pain medication from any other Doctor or Healthcare provider. There are two exceptions to this rule. (see below) In the event that you require additional pain medication, you are responsible for notifying us, as stated below. 8. Medication Agreement: You are responsible for carefully reading and following our Medication Agreement. This must be signed before receiving any prescriptions from our practice. Safely store a copy of your signed Agreement. Violations to the Agreement will result in no further prescriptions. (Additional copies of our Medication Agreement are available upon request.) 9. Laws, Rules, & Regulations: All patients are expected to follow all Federal and [employer], Rules, [employer]. Ignorance of the Laws does not constitute a valid excuse. The use of any illegal substances is prohibited. 10. Adopted CDC guidelines & recommendations: Target dosing levels will be at or below 60 MME/day. Use of benzodiazepines** is not recommended.  Exceptions: There are only two exceptions to the rule of not receiving pain medications from  other Healthcare Providers. 1. Exception #1 (Emergencies): In the event of an emergency (i.e.: accident requiring emergency care), you are allowed to receive additional pain medication. However, you are responsible for: As soon as you are able, call our office [phone], at any time of the day or night, and leave a message stating your name, the date and nature of the emergency, and the name and dose of the medication prescribed. In the event that your call is answered by a member of our staff, make sure to document and save the date, time, and the name of the person that took your information.  2. Exception #2 (Planned Surgery): In the event that you are scheduled by another doctor or dentist to have any type of surgery or procedure, you are allowed (for a period no longer than 30 days), to receive additional pain medication, for the acute post-op pain. However, in this case, you are responsible for picking up a copy of our "Post-op Pain Management for Surgeons" handout, and giving it to your surgeon or dentist. This document is available at our office, and does not require an appointment to obtain it. Simply go to our office during business hours (Monday-Thursday from 8:00 AM to 4:00 PM) (Friday 8:00 AM to 12:00 Noon) or if you have a scheduled appointment with [place], prior to your surgery, and ask for it by name. In addition, you will need to provide [place] with your name, name of your surgeon, type of surgery, and date of procedure or surgery.  *Opioid medications include: morphine, codeine, oxycodone, oxymorphone, hydrocodone, hydromorphone, meperidine, tramadol, tapentadol, buprenorphine, fentanyl, methadone. **Benzodiazepine medications include: diazepam (Valium), alprazolam (Xanax), clonazepam (Klonopine), lorazepam (Ativan), clorazepate (Tranxene), chlordiazepoxide (Librium), estazolam (Prosom), oxazepam (Serax), temazepam (Restoril), triazolam (Halcion)  You were given 3 prescriptions for Hydrocodone  today. ____________________________________________________________________________________________ Epidural Steroid Injection Patient Information  Description: The epidural space surrounds the nerves as they exit the spinal cord.  In some patients, the nerves can be compressed and inflamed by a bulging disc or a tight spinal canal (spinal stenosis).  By injecting steroids into the epidural space, we can bring irritated nerves into direct contact with a potentially helpful medication.  These steroids act directly on the irritated nerves and can reduce swelling  and inflammation which often leads to decreased pain.  Epidural steroids may be injected anywhere along the spine and from the neck to the low back depending upon the location of your pain.   After numbing the skin with local anesthetic (like Novocaine), a small needle is passed into the epidural space slowly.  You may experience a sensation of pressure while this is being done.  The entire block usually last less than 10 minutes.  Conditions which may be treated by epidural steroids:  Low back and leg pain Neck and arm pain Spinal stenosis Post-laminectomy syndrome Herpes zoster (shingles) pain Pain from compression fractures  Preparation for the injection:  Do not eat any solid food or dairy products within 8 hours of your appointment.  You may drink clear liquids up to 3 hours before appointment.  Clear liquids include water, black coffee, juice or soda.  No milk or cream please. You may take your regular medication, including pain medications, with a sip of water before your appointment  Diabetics should hold regular insulin (if taken separately) and take 1/2 normal NPH dos the morning of the procedure.  Carry some sugar containing items with you to your appointment. A driver must accompany you and be prepared to drive you home after your procedure.  Bring all your current medications with your. An IV may be inserted and sedation  may be given at the discretion of the physician.   A blood pressure cuff, EKG and other monitors will often be applied during the procedure.  Some patients may need to have extra oxygen administered for a short period. You will be asked to provide medical information, including your allergies, prior to the procedure.  We must know immediately if you are taking blood thinners (like Coumadin/Warfarin)  Or if you are allergic to IV iodine contrast (dye). We must know if you could possible be pregnant.  Possible side-effects: Bleeding from needle site Infection (rare, may require surgery) Nerve injury (rare) Numbness & tingling (temporary) Difficulty urinating (rare, temporary) Spinal headache ( a headache worse with upright posture) Light -headedness (temporary) Pain at injection site (several days) Decreased blood pressure (temporary) Weakness in arm/leg (temporary) Pressure sensation in back/neck (temporary)  Call if you experience: Fever/chills associated with headache or increased back/neck pain. Headache worsened by an upright position. New onset weakness or numbness of an extremity below the injection site Hives or difficulty breathing (go to the emergency room) Inflammation or drainage at the infection site Severe back/neck pain Any new symptoms which are concerning to you  Please note:  Although the local anesthetic injected can often make your back or neck feel good for several hours after the injection, the pain will likely return.  It takes 3-7 days for steroids to work in the epidural space.  You may not notice any pain relief for at least that one week.  If effective, we will often do a series of three injections spaced 3-6 weeks apart to maximally decrease your pain.  After the initial series, we generally will wait several months before considering a repeat injection of the same type.  If you have any questions, please call [phone] [facility]

## 2016-10-07 ENCOUNTER — Encounter: Payer: Self-pay | Admitting: Pain Medicine

## 2016-10-07 ENCOUNTER — Ambulatory Visit: Payer: Medicare Other | Attending: Pain Medicine | Admitting: Pain Medicine

## 2016-10-07 VITALS — BP 139/84 | HR 72 | Temp 98.0°F | Resp 16 | Ht 60.0 in | Wt 139.0 lb

## 2016-10-07 DIAGNOSIS — Z885 Allergy status to narcotic agent status: Secondary | ICD-10-CM | POA: Diagnosis not present

## 2016-10-07 DIAGNOSIS — M5416 Radiculopathy, lumbar region: Secondary | ICD-10-CM | POA: Diagnosis not present

## 2016-10-07 DIAGNOSIS — E785 Hyperlipidemia, unspecified: Secondary | ICD-10-CM | POA: Diagnosis not present

## 2016-10-07 DIAGNOSIS — Z86010 Personal history of colonic polyps: Secondary | ICD-10-CM | POA: Insufficient documentation

## 2016-10-07 DIAGNOSIS — G894 Chronic pain syndrome: Secondary | ICD-10-CM

## 2016-10-07 DIAGNOSIS — Z9841 Cataract extraction status, right eye: Secondary | ICD-10-CM | POA: Insufficient documentation

## 2016-10-07 DIAGNOSIS — Z79891 Long term (current) use of opiate analgesic: Secondary | ICD-10-CM | POA: Diagnosis not present

## 2016-10-07 DIAGNOSIS — I10 Essential (primary) hypertension: Secondary | ICD-10-CM | POA: Insufficient documentation

## 2016-10-07 DIAGNOSIS — K589 Irritable bowel syndrome without diarrhea: Secondary | ICD-10-CM | POA: Diagnosis not present

## 2016-10-07 DIAGNOSIS — M9981 Other biomechanical lesions of cervical region: Secondary | ICD-10-CM | POA: Diagnosis not present

## 2016-10-07 DIAGNOSIS — G629 Polyneuropathy, unspecified: Secondary | ICD-10-CM | POA: Insufficient documentation

## 2016-10-07 DIAGNOSIS — Q761 Klippel-Feil syndrome: Secondary | ICD-10-CM | POA: Diagnosis not present

## 2016-10-07 DIAGNOSIS — M4803 Spinal stenosis, cervicothoracic region: Secondary | ICD-10-CM | POA: Insufficient documentation

## 2016-10-07 DIAGNOSIS — I493 Ventricular premature depolarization: Secondary | ICD-10-CM | POA: Insufficient documentation

## 2016-10-07 DIAGNOSIS — R531 Weakness: Secondary | ICD-10-CM | POA: Insufficient documentation

## 2016-10-07 DIAGNOSIS — F411 Generalized anxiety disorder: Secondary | ICD-10-CM | POA: Insufficient documentation

## 2016-10-07 DIAGNOSIS — Z880 Allergy status to penicillin: Secondary | ICD-10-CM | POA: Insufficient documentation

## 2016-10-07 DIAGNOSIS — Z7982 Long term (current) use of aspirin: Secondary | ICD-10-CM | POA: Diagnosis not present

## 2016-10-07 DIAGNOSIS — F1190 Opioid use, unspecified, uncomplicated: Secondary | ICD-10-CM | POA: Insufficient documentation

## 2016-10-07 DIAGNOSIS — M797 Fibromyalgia: Secondary | ICD-10-CM | POA: Insufficient documentation

## 2016-10-07 DIAGNOSIS — M4802 Spinal stenosis, cervical region: Secondary | ICD-10-CM | POA: Diagnosis not present

## 2016-10-07 DIAGNOSIS — M5412 Radiculopathy, cervical region: Secondary | ICD-10-CM | POA: Insufficient documentation

## 2016-10-07 DIAGNOSIS — Z8719 Personal history of other diseases of the digestive system: Secondary | ICD-10-CM | POA: Diagnosis not present

## 2016-10-07 DIAGNOSIS — Z79899 Other long term (current) drug therapy: Secondary | ICD-10-CM | POA: Insufficient documentation

## 2016-10-07 DIAGNOSIS — R200 Anesthesia of skin: Secondary | ICD-10-CM | POA: Insufficient documentation

## 2016-10-07 DIAGNOSIS — K449 Diaphragmatic hernia without obstruction or gangrene: Secondary | ICD-10-CM | POA: Insufficient documentation

## 2016-10-07 DIAGNOSIS — R7303 Prediabetes: Secondary | ICD-10-CM | POA: Diagnosis not present

## 2016-10-07 DIAGNOSIS — M48061 Spinal stenosis, lumbar region without neurogenic claudication: Secondary | ICD-10-CM | POA: Diagnosis not present

## 2016-10-07 DIAGNOSIS — Z87891 Personal history of nicotine dependence: Secondary | ICD-10-CM | POA: Insufficient documentation

## 2016-10-07 DIAGNOSIS — Z9889 Other specified postprocedural states: Secondary | ICD-10-CM | POA: Insufficient documentation

## 2016-10-07 DIAGNOSIS — Z888 Allergy status to other drugs, medicaments and biological substances status: Secondary | ICD-10-CM | POA: Diagnosis not present

## 2016-10-07 MED ORDER — HYDROCODONE-ACETAMINOPHEN 5-325 MG PO TABS: 0.5000 | ORAL_TABLET | Freq: Every day | ORAL | 0 refills | Status: DC

## 2016-10-07 NOTE — Patient Instructions (Addendum)
____________________________________________________________________________________________  Medication Rules  Applies to: All patients receiving prescriptions (written or electronic).  Pharmacy of record: Pharmacy where electronic prescriptions will be sent. If written prescriptions are taken to a different pharmacy, please inform the nursing staff. The pharmacy listed in the electronic medical record should be the one where you would like electronic prescriptions to be sent.  Prescription refills: Only during scheduled appointments. Applies to both, written and electronic prescriptions.  NOTE: The following applies primarily to controlled substances (Opioid* Pain Medications).   Patient's responsibilities: 1. Pain Pills: Bring all pain pills to every appointment (except for procedure appointments). 2. Pill Bottles: Bring pills in original pharmacy bottle. Always bring newest bottle. Bring bottle, even if empty. 3. Medication refills: You are responsible for knowing and keeping track of what medications you need refilled. The day before your appointment, write a list of all prescriptions that need to be refilled. Bring that list to your appointment and give it to the admitting nurse. Prescriptions will be written only during appointments. If you forget a medication, it will not be "Called in", "Faxed", or "electronically sent". You will need to get another appointment to get these prescribed. 4. Prescription Accuracy: You are responsible for carefully inspecting your prescriptions before leaving our office. Have the discharge nurse carefully go over each prescription with you, before taking them home. Make sure that your name is accurately spelled, that your address is correct. Check the name and dose of your medication to make sure it is accurate. Check the number of pills, and the written instructions to make sure they are clear and accurate. Make sure that you are given enough medication to  last until your next medication refill appointment. 5. Taking Medication: Take medication as prescribed. Never take more pills than instructed. Never take medication more frequently than prescribed. Taking less pills or less frequently is permitted and encouraged, when it comes to controlled substances (written prescriptions).  6. Inform other Doctors: Always inform, all of your healthcare providers, of all the medications you take. 7. Pain Medication from other Providers: You are not allowed to accept any additional pain medication from any other Doctor or Healthcare provider. There are two exceptions to this rule. (see below) In the event that you require additional pain medication, you are responsible for notifying us, as stated below. 8. Medication Agreement: You are responsible for carefully reading and following our Medication Agreement. This must be signed before receiving any prescriptions from our practice. Safely store a copy of your signed Agreement. Violations to the Agreement will result in no further prescriptions. (Additional copies of our Medication Agreement are available upon request.) 9. Laws, Rules, & Regulations: All patients are expected to follow all Federal and State Laws, Statutes, Rules, & Regulations. Ignorance of the Laws does not constitute a valid excuse. The use of any illegal substances is prohibited. 10. Adopted CDC guidelines & recommendations: Target dosing levels will be at or below 60 MME/day. Use of benzodiazepines** is not recommended.  Exceptions: There are only two exceptions to the rule of not receiving pain medications from other Healthcare Providers. 1. Exception #1 (Emergencies): In the event of an emergency (i.e.: accident requiring emergency care), you are allowed to receive additional pain medication. However, you are responsible for: As soon as you are able, call our office [phone], at any time of the day or night, and leave a message stating your  name, the date and nature of the emergency, and the name and dose of the medication   prescribed. In the event that your call is answered by a member of our staff, make sure to document and save the date, time, and the name of the person that took your information.  2. Exception #2 (Planned Surgery): In the event that you are scheduled by another doctor or dentist to have any type of surgery or procedure, you are allowed (for a period no longer than 30 days), to receive additional pain medication, for the acute post-op pain. However, in this case, you are responsible for picking up a copy of our "Post-op Pain Management for Surgeons" handout, and giving it to your surgeon or dentist. This document is available at our office, and does not require an appointment to obtain it. Simply go to our office during business hours (Monday-Thursday from 8:00 AM to 4:00 PM) (Friday 8:00 AM to 12:00 Noon) or if you have a scheduled appointment with [place], prior to your surgery, and ask for it by name. In addition, you will need to provide [place] with your name, name of your surgeon, type of surgery, and date of procedure or surgery.  *Opioid medications include: morphine, codeine, oxycodone, oxymorphone, hydrocodone, hydromorphone, meperidine, tramadol, tapentadol, buprenorphine, fentanyl, methadone. **Benzodiazepine medications include: diazepam (Valium), alprazolam (Xanax), clonazepam (Klonopine), lorazepam (Ativan), clorazepate (Tranxene), chlordiazepoxide (Librium), estazolam (Prosom), oxazepam (Serax), temazepam (Restoril), triazolam (Halcion)  You were given 3 prescriptions for Hydrocodone today. ____________________________________________________________________________________________ Epidural Steroid Injection Patient Information  Description: The epidural space surrounds the nerves as they exit the spinal cord.  In some patients, the nerves can be compressed and inflamed by a bulging disc or a tight spinal canal  (spinal stenosis).  By injecting steroids into the epidural space, we can bring irritated nerves into direct contact with a potentially helpful medication.  These steroids act directly on the irritated nerves and can reduce swelling and inflammation which often leads to decreased pain.  Epidural steroids may be injected anywhere along the spine and from the neck to the low back depending upon the location of your pain.   After numbing the skin with local anesthetic (like Novocaine), a small needle is passed into the epidural space slowly.  You may experience a sensation of pressure while this is being done.  The entire block usually last less than 10 minutes.  Conditions which may be treated by epidural steroids:  Low back and leg pain Neck and arm pain Spinal stenosis Post-laminectomy syndrome Herpes zoster (shingles) pain Pain from compression fractures  Preparation for the injection:  Do not eat any solid food or dairy products within 8 hours of your appointment.  You may drink clear liquids up to 3 hours before appointment.  Clear liquids include water, black coffee, juice or soda.  No milk or cream please. You may take your regular medication, including pain medications, with a sip of water before your appointment  Diabetics should hold regular insulin (if taken separately) and take 1/2 normal NPH dos the morning of the procedure.  Carry some sugar containing items with you to your appointment. A driver must accompany you and be prepared to drive you home after your procedure.  Bring all your current medications with your. An IV may be inserted and sedation may be given at the discretion of the physician.   A blood pressure cuff, EKG and other monitors will often be applied during the procedure.  Some patients may need to have extra oxygen administered for a short period. You will be asked to provide medical information, including your allergies,  prior to the procedure.  We must know  immediately if you are taking blood thinners (like Coumadin/Warfarin)  Or if you are allergic to IV iodine contrast (dye). We must know if you could possible be pregnant.  Possible side-effects: Bleeding from needle site Infection (rare, may require surgery) Nerve injury (rare) Numbness & tingling (temporary) Difficulty urinating (rare, temporary) Spinal headache ( a headache worse with upright posture) Light -headedness (temporary) Pain at injection site (several days) Decreased blood pressure (temporary) Weakness in arm/leg (temporary) Pressure sensation in back/neck (temporary)  Call if you experience: Fever/chills associated with headache or increased back/neck pain. Headache worsened by an upright position. New onset weakness or numbness of an extremity below the injection site Hives or difficulty breathing (go to the emergency room) Inflammation or drainage at the infection site Severe back/neck pain Any new symptoms which are concerning to you  Please note:  Although the local anesthetic injected can often make your back or neck feel good for several hours after the injection, the pain will likely return.  It takes 3-7 days for steroids to work in the epidural space.  You may not notice any pain relief for at least that one week.  If effective, we will often do a series of three injections spaced 3-6 weeks apart to maximally decrease your pain.  After the initial series, we generally will wait several months before considering a repeat injection of the same type.  If you have any questions, please call [phone] [facility]

## 2016-10-07 NOTE — Progress Notes (Signed)
Nursing Pain Medication Assessment:  Safety precautions to be maintained throughout the outpatient stay will include: orient to surroundings, keep bed in low position, maintain call bell within reach at all times, provide assistance with transfer out of bed and ambulation.  Medication Inspection Compliance: Pill count conducted under aseptic conditions, in front of the patient. Neither the pills nor the bottle was removed from the patient's sight at any time. Once count was completed pills were immediately returned to the patient in their original bottle.  Medication: Hydrocodone/APAP Pill/Patch Count: 12.5 of 30 pills remain Pill/Patch Appearance: Markings consistent with prescribed medication Bottle Appearance: Standard pharmacy container. Clearly labeled. Filled Date:05/14/ 2018 Last Medication intake:  Yesterday  Post RF

## 2016-10-20 NOTE — Progress Notes (Signed)
PCP notes:   Health maintenance: Tetanus-due, patient to check w/ insurance regarding coverage.   Abnormal screenings: None   Patient concerns: None   Nurse concerns: None   Next PCP appt: 11/06/2016 @ 11:30am

## 2016-10-20 NOTE — Progress Notes (Signed)
Subjective:   Cheyenne Pierce is a 82 y.o. female who presents for Medicare Annual (Subsequent) preventive examination.  Review of Systems:  No ROS.  Medicare Wellness Visit. Additional risk factors are reflected in the social history.  Cardiac Risk Factors include: advanced age (>2men, >46 women);dyslipidemia;sedentary lifestyle     Objective:     Vitals: BP 138/76   Pulse 70   Ht 4\' 11"  (1.499 m)   Wt 139 lb 4 oz (63.2 kg)   SpO2 97%   BMI 28.13 kg/m   Body mass index is 28.13 kg/m.   Tobacco History  Smoking Status  . Former Smoker  . Packs/day: 1.00  . Years: 30.00  Smokeless Tobacco  . Never Used    Comment: quit smoking in 1980     Counseling given: Not Answered   Past Medical History:  Diagnosis Date  . Allergy   . Angina pectoris ([place])   . Anxiety    takes Wellbutrin daily  . Cataract    left eye,immature  . Chronic back pain    scoliosis/DDD  . DDD (degenerative disc disease), cervical    moderate to severe multilevel cervical spine DDD, most severe central stenosis C5/6, significant R foraminal stenosis ([name])  . DDD (degenerative disc disease), cervical    throughout spine   . DDD (degenerative disc disease), lumbar    multilevel degenerative changes mostly L3/4 with mod spinal canal and mild bilat neural foraminal stenosis, moderate R neural froaminal narrowing L4/5 ([name])  . Dental bridge present   . Depression   . Diverticulosis   . GERD (gastroesophageal reflux disease)    takes Protonix daily  . Headache   . Heart murmur   . History of bronchitis    last time Spring 2016  . History of colon polyps    benign  . History of gastric ulcer   . History of hiatal hernia   . Hypertension    takes Amlodipine and Maxzide daily  . Hypothyroidism    takes SYnthroid daily  . IBS (irritable bowel syndrome)    diarrhea-takes Immodium as needed  . Insomnia    takes Benadryl nightly  . Joint pain   . Joint swelling   . Mass of  vagina    referral to [facility] 03/2014  . Nocturia   . Peripheral neuropathy    in feet, ESI have helped [name])  . Peripheral neuropathy    takes Gabapentin daily  . Urinary frequency   . Urinary urgency   . Vertigo    takes Antivert as needed   Past Surgical History:  Procedure Laterality Date  . ABDOMINAL HYSTERECTOMY  1968   left ovary remains  . ANTERIOR CERVICAL DECOMPRESSION/DISCECTOMY FUSION 4 LEVELS N/A 06/05/2015   Procedure: ANTERIOR CERVICAL DECOMPRESSION/DISCECTOMY FUSION CERVICAL THREE-FOUR,CERVICAL FOUR-FIVE,CERVICAL FIVE-SIX,CERVICAL SIX-SEVEN;  Surgeon: Earnie Larsson, MD;  Location: [place];  Service: Neurosurgery;  Laterality: N/A;  right side approach  . APPENDECTOMY  1948  . bladder suspension with vaginal sling    . CARDIAC CATHETERIZATION  09/26/1998  . CATARACT EXTRACTION W/PHACO Left 10/15/2015   Procedure: CATARACT EXTRACTION PHACO AND INTRAOCULAR LENS PLACEMENT (IOC) LEFT EYE;  Surgeon: Ronnell Freshwater, MD;  Location: [facility];  Service: Ophthalmology;  Laterality: Left;  LEFT LEAVE PT LAST  . CATARACT EXTRACTION W/PHACO Right 11/26/2015   Procedure: CATARACT EXTRACTION PHACO AND INTRAOCULAR LENS PLACEMENT (IOC);  Surgeon: Ronnell Freshwater, MD;  Location: [place];  Service: Ophthalmology;  Laterality:  Right;  RIGHT  . COLONOSCOPY  07/2009   [facility]), normal per pt  . COLONOSCOPY  06/2014   int hem, rpt 5 yrs ([name])  . COLOSTOMY W/ RECTOCELE REPAIR  06/2004  . DEXA  2013   T -1.0  . epidural infections    . epidural steroid injections  multiple latest 05/2014, 07/2014   help periph neuropathy [name])  . ESOPHAGOGASTRODUODENOSCOPY    . EYE SURGERY Left 10/15/2015   cataract removal  . HERNIA REPAIR    . NM MYOVIEW LTD  2009   WNL per report  . OVARIAN CYST SURGERY  1963   cyst on ovaries  . TONSILLECTOMY    . US ECHOCARDIOGRAPHY  2009   mild aortic/mitral insuff, ER 78%, mild diastolic  dysfunction  . VENTRAL HERNIA REPAIR  08/2008   abdominal wall, lysis of adhesions   Family History  Problem Relation Age of Onset  . Heart disease Mother   . Stroke Brother        2 strokes  . Hypertension Neg Hx   . Diabetes Neg Hx   . Cancer Neg Hx        breast or colon cancer, no history   History  Sexual Activity  . Sexual activity: Not on file    Outpatient Encounter Prescriptions as of 10/28/2016  Medication Sig  . acetaminophen (TYLENOL) 500 MG tablet Take 500 mg by mouth every 6 (six) hours as needed.  Marland Kitchen amLODipine (NORVASC) 2.5 MG tablet Take 1 tablet (2.5 mg total) by mouth daily.  Marland Kitchen aspirin EC 81 MG tablet Take 81 mg by mouth as needed.  . ASTRAGALUS PO Take 500 mg by mouth daily.  . Biotin 1000 MCG tablet Take 1,000 mcg by mouth daily.  . calcium-vitamin D (OSCAL WITH D) 500-200 MG-UNIT tablet Take 1 tablet by mouth.  . diphenhydrAMINE (BENADRYL) 25 mg capsule Take 25 mg by mouth at bedtime.   Marland Kitchen HYDROcodone-acetaminophen (NORCO/VICODIN) 5-325 MG tablet Take 0.5 tablets by mouth daily.  Derrill Memo ON 11/06/2016] HYDROcodone-acetaminophen (NORCO/VICODIN) 5-325 MG tablet Take 0.5 tablets by mouth daily.  Derrill Memo ON 12/06/2016] HYDROcodone-acetaminophen (NORCO/VICODIN) 5-325 MG tablet Take 0.5 tablets by mouth daily.  . hyoscyamine (LEVSIN, ANASPAZ) 0.125 MG tablet Take 1 tablet (0.125 mg total) by mouth 2 (two) times daily as needed.  Marland Kitchen levothyroxine (LEVOXYL) 75 MCG tablet Take 75 mcg by mouth daily. Take extra pill on Wednesday and Sunday  . loperamide (IMODIUM) 2 MG capsule Take by mouth as needed for diarrhea or loose stools.  . Magnesium 250 MG TABS Take by mouth daily.  . meclizine (ANTIVERT) 25 MG tablet take 1 tablet by mouth three times a day if needed  . nitroGLYCERIN (NITROSTAT) 0.4 MG SL tablet Place 0.4 mg under the tongue every 5 (five) minutes as needed.  . pantoprazole (PROTONIX) 40 MG tablet Take 1 tablet (40 mg total) by mouth daily.  . pregabalin (LYRICA)  150 MG capsule Take 1 capsule (150 mg total) by mouth 3 (three) times daily.  Marland Kitchen triamterene-hydrochlorothiazide (MAXZIDE-25) 37.5-25 MG tablet take 1 tablet by mouth once daily  . [DISCONTINUED] aspirin 325 MG tablet Take 325 mg by mouth daily.   No facility-administered encounter medications on file as of 10/28/2016.     Activities of Daily Living In your present state of health, do you have any difficulty performing the following activities: 10/28/2016  Hearing? N  Vision? N  Difficulty concentrating or making decisions? N  Walking or climbing stairs?  N  Dressing or bathing? N  Doing errands, shopping? N  Preparing Food and eating ? N  Using the Toilet? N  In the past six months, have you accidently leaked urine? N  Do you have problems with loss of bowel control? N  Managing your Medications? N  Managing your Finances? N  Housekeeping or managing your Housekeeping? N  Some recent data might be hidden    Patient Care Team: Ria Bush, MD as PCP - General (Family Medicine) Vin-Parikh, Deirdre Peer, MD as Consulting Physician (Ophthalmology) Milinda Pointer, MD as Consulting Physician (Pain Medicine)    Assessment:    Physical assessment deferred to PCP.  Exercise Activities and Dietary recommendations Current Exercise Habits: The patient does not participate in regular exercise at present, Exercise limited by: Other - see comments (chronic pain)  Goals    . Increase physical activity (pt-stated)          Starting 10/28/2016, I will start walking 10 minutes 2 days weekly and increase as tolerated.      Fall Risk Fall Risk  10/28/2016 10/07/2016 08/18/2016 07/30/2016 07/02/2016  Falls in the past year? No No No No No  Number falls in past yr: - - - - -  Injury with Fall? - - - - -  Risk for fall due to : - - - - -  Risk for fall due to (comments): - - - - -  Follow up - - - - -   Depression Screen PHQ 2/9 Scores 10/28/2016 10/07/2016 08/18/2016 07/30/2016  PHQ - 2  Score 1 0 0 0  PHQ- 9 Score - - - -  Exception Documentation - - - -     Cognitive Function PLEASE NOTE: A Mini-Cog screen was completed. Maximum score is 20. A value of 0 denotes this part of Folstein MMSE was not completed or the patient failed this part of the Mini-Cog screening.   Mini-Cog Screening Orientation to Time - Max 5 pts Orientation to Place - Max 5 pts Registration - Max 3 pts Recall - Max 3 pts Language Repeat - Max 1 pts Language Follow 3 Step Command - Max 3 pts      Mini-Cog - 10/28/16 1355    Normal clock drawing test? yes   How many words correct? 3      MMSE - Mini Mental State Exam 10/28/2016  Orientation to time 4  Orientation to Place 5  Registration 3  Attention/ Calculation 0  Recall 3  Language- name 2 objects 0  Language- repeat 1  Language- follow 3 step command 3  Language- read & follow direction 0  Write a sentence 0  Copy design 0  Total score 19        Immunization History  Administered Date(s) Administered  . Influenza Split 12/31/2010, 01/01/2012  . Influenza Whole 01/07/2007, 01/03/2008, 01/04/2009, 12/25/2009  . Influenza,inj,Quad PF,36+ Mos 12/30/2012, 12/27/2013, 12/26/2014, 01/04/2016  . Pneumococcal Conjugate-13 10/11/2013  . Pneumococcal Polysaccharide-23 12/05/2004  . Td 05/05/2005  . Zoster 07/30/2011   Screening Tests Health Maintenance  Topic Date Due  . DTaP/Tdap/Td (1 - Tdap) 10/28/2017 (Originally 05/06/2005)  . TETANUS/TDAP  10/28/2017 (Originally 05/06/2015)  . INFLUENZA VACCINE  11/05/2016  . MAMMOGRAM  12/12/2016  . DEXA SCAN  Completed  . PNA vac Low Risk Adult  Completed      Plan:    Follow-up w/ PCP as scheduled.   I have personally reviewed and noted the following in the patient's chart:   .  Medical and social history . Use of alcohol, tobacco or illicit drugs  . Current medications and supplements . Functional ability and status . Nutritional status . Physical activity . Advanced  directives . List of other physicians . Vitals . Screenings to include cognitive, depression, and falls . Referrals and appointments  In addition, I have reviewed and discussed with patient certain preventive protocols, quality metrics, and best practice recommendations. A written personalized care plan for preventive services as well as general preventive health recommendations were provided to patient.     Dorrene German, RN  10/28/2016

## 2016-10-27 ENCOUNTER — Other Ambulatory Visit: Payer: Self-pay | Admitting: Family Medicine

## 2016-10-27 DIAGNOSIS — E785 Hyperlipidemia, unspecified: Secondary | ICD-10-CM

## 2016-10-27 DIAGNOSIS — I10 Essential (primary) hypertension: Secondary | ICD-10-CM

## 2016-10-27 DIAGNOSIS — G609 Hereditary and idiopathic neuropathy, unspecified: Secondary | ICD-10-CM

## 2016-10-27 DIAGNOSIS — R7303 Prediabetes: Secondary | ICD-10-CM

## 2016-10-27 DIAGNOSIS — E039 Hypothyroidism, unspecified: Secondary | ICD-10-CM

## 2016-10-28 ENCOUNTER — Other Ambulatory Visit (INDEPENDENT_AMBULATORY_CARE_PROVIDER_SITE_OTHER): Payer: Medicare Other

## 2016-10-28 ENCOUNTER — Ambulatory Visit (INDEPENDENT_AMBULATORY_CARE_PROVIDER_SITE_OTHER): Payer: Medicare Other

## 2016-10-28 VITALS — BP 138/76 | HR 70 | Ht 59.0 in | Wt 139.2 lb

## 2016-10-28 DIAGNOSIS — E785 Hyperlipidemia, unspecified: Secondary | ICD-10-CM

## 2016-10-28 DIAGNOSIS — Z0000 Encounter for general adult medical examination without abnormal findings: Secondary | ICD-10-CM

## 2016-10-28 DIAGNOSIS — R7303 Prediabetes: Secondary | ICD-10-CM

## 2016-10-28 DIAGNOSIS — E039 Hypothyroidism, unspecified: Secondary | ICD-10-CM

## 2016-10-28 DIAGNOSIS — I10 Essential (primary) hypertension: Secondary | ICD-10-CM | POA: Diagnosis not present

## 2016-10-28 LAB — COMPREHENSIVE METABOLIC PANEL
ALBUMIN: 4.3 g/dL (ref 3.5–5.2)
ALT: 17 U/L (ref 0–35)
AST: 113 U/L — ABNORMAL HIGH (ref 0–37)
Alkaline Phosphatase: 41 U/L (ref 39–117)
BUN: 11 mg/dL (ref 6–23)
CALCIUM: 9.9 mg/dL (ref 8.4–10.5)
CHLORIDE: 99 meq/L (ref 96–112)
CO2: 30 mEq/L (ref 19–32)
Creatinine, Ser: 0.71 mg/dL (ref 0.40–1.20)
GFR: 84.12 mL/min (ref >60.00)
Glucose, Bld: 92 mg/dL (ref 70–99)
POTASSIUM: 3.8 meq/L (ref 3.5–5.1)
Sodium: 136 mEq/L (ref 135–145)
Total Bilirubin: 0.5 mg/dL (ref 0.2–1.2)
Total Protein: 6.9 g/dL (ref 6.0–8.3)

## 2016-10-28 LAB — T4, FREE: FREE T4: 0.76 ng/dL (ref 0.60–1.60)

## 2016-10-28 LAB — TSH: TSH: 5.71 u[IU]/mL — ABNORMAL HIGH (ref 0.35–4.50)

## 2016-10-28 LAB — LIPID PANEL
CHOL/HDL RATIO: 4
Cholesterol: 203 mg/dL — ABNORMAL HIGH (ref 0–200)
HDL: 55.9 mg/dL (ref >39.00)
LDL CALC: 111 mg/dL — AB (ref 0–99)
NONHDL: 147.35
TRIGLYCERIDES: 180 mg/dL — AB (ref 0.0–149.0)
VLDL: 36 mg/dL (ref 0.0–40.0)

## 2016-10-28 LAB — HEMOGLOBIN A1C: HEMOGLOBIN A1C: 6 % (ref 4.6–6.5)

## 2016-10-28 NOTE — Progress Notes (Signed)
I reviewed health advisor's note, was available for consultation, and agree with documentation and plan.  

## 2016-10-28 NOTE — Patient Instructions (Addendum)
Cheyenne Pierce , Thank you for taking time to come for your Medicare Wellness Visit. I appreciate your ongoing commitment to your health goals. Please review the following plan we discussed and let me know if I can assist you in the future.   These are the goals we discussed: Goals    . Increase physical activity (pt-stated)          Starting 10/28/2016, I will start walking 10 minutes 2 days weekly and increase as tolerated.       This is a list of the screening recommended for you and due dates:  Health Maintenance  Topic Date Due  . DTaP/Tdap/Td vaccine (1 - Tdap) 10/28/2017*  . Tetanus Vaccine  10/28/2017*  . Flu Shot  11/05/2016  . Mammogram  12/12/2016  . DEXA scan (bone density measurement)  Completed  . Pneumonia vaccines  Completed  *Topic was postponed. The date shown is not the original due date.   Preventive Care for Adults  A healthy lifestyle and preventive care can promote health and wellness. Preventive health guidelines for adults include the following key practices.  . A routine yearly physical is a good way to check with your health care provider about your health and preventive screening. It is a chance to share any concerns and updates on your health and to receive a thorough exam.  . Visit your dentist for a routine exam and preventive care every 6 months. Brush your teeth twice a day and floss once a day. Good oral hygiene prevents tooth decay and gum disease.  . The frequency of eye exams is based on your age, health, family medical history, use  of contact lenses, and other factors. Follow your health care provider's ecommendations for frequency of eye exams.  . Eat a healthy diet. Foods like vegetables, fruits, whole grains, low-fat dairy products, and lean protein foods contain the nutrients you need without too many calories. Decrease your intake of foods high in solid fats, added sugars, and salt. Eat the right amount of calories for you. Get information  about a proper diet from your health care provider, if necessary.  . Regular physical exercise is one of the most important things you can do for your health. Most adults should get at least 150 minutes of moderate-intensity exercise (any activity that increases your heart rate and causes you to sweat) each week. In addition, most adults need muscle-strengthening exercises on 2 or more days a week.  Silver Sneakers may be a benefit available to you. To determine eligibility, you may visit the website: www.silversneakers.com or contact program at [phone] Mon-Fri between 8AM-8PM.   . Maintain a healthy weight. The body mass index (BMI) is a screening tool to identify possible weight problems. It provides an estimate of body fat based on height and weight. Your health care provider can find your BMI and can help you achieve or maintain a healthy weight.   For adults 20 years and older: ? A BMI below 18.5 is considered underweight. ? A BMI of 18.5 to 24.9 is normal. ? A BMI of 25 to 29.9 is considered overweight. ? A BMI of 30 and above is considered obese.   . Maintain normal blood lipids and cholesterol levels by exercising and minimizing your intake of saturated fat. Eat a balanced diet with plenty of fruit and vegetables. Blood tests for lipids and cholesterol should begin at age 55 and be repeated every 5 years. If your lipid or cholesterol levels are high,  you are over 50, or you are at high risk for heart disease, you may need your cholesterol levels checked more frequently. Ongoing high lipid and cholesterol levels should be treated with medicines if diet and exercise are not working.  . If you smoke, find out from your health care provider how to quit. If you do not use tobacco, please do not start.  . If you choose to drink alcohol, please do not consume more than 2 drinks per day. One drink is considered to be 12 ounces (355 mL) of beer, 5 ounces (148 mL) of wine, or 1.5 ounces (44  mL) of liquor.  . If you are 26-29 years old, ask your health care provider if you should take aspirin to prevent strokes.  . Use sunscreen. Apply sunscreen liberally and repeatedly throughout the day. You should seek shade when your shadow is shorter than you. Protect yourself by wearing long sleeves, pants, a wide-brimmed hat, and sunglasses year round, whenever you are outdoors.  . Once a month, do a whole body skin exam, using a mirror to look at the skin on your back. Tell your health care provider of new moles, moles that have irregular borders, moles that are larger than a pencil eraser, or moles that have changed in shape or color.

## 2016-10-29 ENCOUNTER — Telehealth: Payer: Self-pay | Admitting: Pain Medicine

## 2016-10-29 NOTE — Telephone Encounter (Signed)
Called to get additional information, left callback number in que as wait time was 34 minutes to speak with representative.

## 2016-10-29 NOTE — Telephone Encounter (Signed)
[facility] called about Cheyenne Pierce they need a new script for med that she gets thru them.  Phone [phone] Fax [phone] Please call them to discuss and get new script to them. They did not say which script

## 2016-10-30 ENCOUNTER — Ambulatory Visit: Payer: Medicare Other

## 2016-10-30 ENCOUNTER — Other Ambulatory Visit: Payer: Medicare Other

## 2016-11-06 ENCOUNTER — Ambulatory Visit (INDEPENDENT_AMBULATORY_CARE_PROVIDER_SITE_OTHER): Payer: Medicare Other | Admitting: Family Medicine

## 2016-11-06 ENCOUNTER — Encounter: Payer: Self-pay | Admitting: Family Medicine

## 2016-11-06 VITALS — BP 112/74 | HR 72 | Temp 97.7°F | Ht 59.0 in | Wt 139.8 lb

## 2016-11-06 DIAGNOSIS — Z1283 Encounter for screening for malignant neoplasm of skin: Secondary | ICD-10-CM | POA: Diagnosis not present

## 2016-11-06 DIAGNOSIS — E039 Hypothyroidism, unspecified: Secondary | ICD-10-CM | POA: Diagnosis not present

## 2016-11-06 DIAGNOSIS — F331 Major depressive disorder, recurrent, moderate: Secondary | ICD-10-CM

## 2016-11-06 DIAGNOSIS — B9789 Other viral agents as the cause of diseases classified elsewhere: Secondary | ICD-10-CM | POA: Diagnosis not present

## 2016-11-06 DIAGNOSIS — Z7189 Other specified counseling: Secondary | ICD-10-CM | POA: Diagnosis not present

## 2016-11-06 DIAGNOSIS — M8580 Other specified disorders of bone density and structure, unspecified site: Secondary | ICD-10-CM | POA: Diagnosis not present

## 2016-11-06 DIAGNOSIS — J069 Acute upper respiratory infection, unspecified: Secondary | ICD-10-CM

## 2016-11-06 DIAGNOSIS — E785 Hyperlipidemia, unspecified: Secondary | ICD-10-CM | POA: Diagnosis not present

## 2016-11-06 DIAGNOSIS — R7303 Prediabetes: Secondary | ICD-10-CM | POA: Diagnosis not present

## 2016-11-06 DIAGNOSIS — I10 Essential (primary) hypertension: Secondary | ICD-10-CM

## 2016-11-06 DIAGNOSIS — Z0001 Encounter for general adult medical examination with abnormal findings: Secondary | ICD-10-CM | POA: Diagnosis not present

## 2016-11-06 DIAGNOSIS — G609 Hereditary and idiopathic neuropathy, unspecified: Secondary | ICD-10-CM | POA: Diagnosis not present

## 2016-11-06 DIAGNOSIS — Z0000 Encounter for general adult medical examination without abnormal findings: Secondary | ICD-10-CM

## 2016-11-06 MED ORDER — LEVOTHYROXINE SODIUM 75 MCG PO TABS: 75.0000 ug | ORAL_TABLET | Freq: Every day | ORAL | 6 refills | Status: DC

## 2016-11-06 MED ORDER — PANTOPRAZOLE SODIUM 40 MG PO TBEC: 40.0000 mg | DELAYED_RELEASE_TABLET | Freq: Every day | ORAL | 11 refills | Status: DC

## 2016-11-06 NOTE — Patient Instructions (Addendum)
Call and make follow up appointment for mammogram when due. Sign release for records from recent mammogram at [facility].  We will call you to set up skin cancer screen with dermatology.  If interested check with pharmacy about new 2 shot shingles series (shingrix).  Let me know if cough and congestion doesn't continue to improve each day, or if fever >101 or worsening productive cough develop.  Increase levothyroxine to 24mg daily - new dose at pharmacy. Return in 2-3 months for lab visit only.  Return in 4-6 months for office visit.   Health Maintenance, Female Adopting a healthy lifestyle and getting preventive care can go a long way to promote health and wellness. Talk with your health care provider about what schedule of regular examinations is right for you. This is a good chance for you to check in with your provider about disease prevention and staying healthy. In between checkups, there are plenty of things you can do on your own. Experts have done a lot of research about which lifestyle changes and preventive measures are most likely to keep you healthy. Ask your health care provider for more information. Weight and diet Eat a healthy diet  Be sure to include plenty of vegetables, fruits, low-fat dairy products, and lean protein.  Do not eat a lot of foods high in solid fats, added sugars, or salt.  Get regular exercise. This is one of the most important things you can do for your health. ? Most adults should exercise for at least 150 minutes each week. The exercise should increase your heart rate and make you sweat (moderate-intensity exercise). ? Most adults should also do strengthening exercises at least twice a week. This is in addition to the moderate-intensity exercise.  Maintain a healthy weight  Body mass index (BMI) is a measurement that can be used to identify possible weight problems. It estimates body fat based on height and weight. Your health care provider can help  determine your BMI and help you achieve or maintain a healthy weight.  For females 266years of age and older: ? A BMI below 18.5 is considered underweight. ? A BMI of 18.5 to 24.9 is normal. ? A BMI of 25 to 29.9 is considered overweight. ? A BMI of 30 and above is considered obese.  Watch levels of cholesterol and blood lipids  You should start having your blood tested for lipids and cholesterol at 80years of age, then have this test every 5 years.  You may need to have your cholesterol levels checked more often if: ? Your lipid or cholesterol levels are high. ? You are older than 80years of age. ? You are at high risk for heart disease.  Cancer screening Lung Cancer  Lung cancer screening is recommended for adults 559880years old who are at high risk for lung cancer because of a history of smoking.  A yearly low-dose CT scan of the lungs is recommended for people who: ? Currently smoke. ? Have quit within the past 15 years. ? Have at least a 30-pack-year history of smoking. A pack year is smoking an average of one pack of cigarettes a day for 1 year.  Yearly screening should continue until it has been 15 years since you quit.  Yearly screening should stop if you develop a health problem that would prevent you from having lung cancer treatment.  Breast Cancer  Practice breast self-awareness. This means understanding how your breasts normally appear and feel.  It also  means doing regular breast self-exams. Let your health care provider know about any changes, no matter how small.  If you are in your 20s or 30s, you should have a clinical breast exam (CBE) by a health care provider every 1-3 years as part of a regular health exam.  If you are 55 or older, have a CBE every year. Also consider having a breast X-ray (mammogram) every year.  If you have a family history of breast cancer, talk to your health care provider about genetic screening.  If you are at high risk for  breast cancer, talk to your health care provider about having an MRI and a mammogram every year.  Breast cancer gene (BRCA) assessment is recommended for women who have family members with BRCA-related cancers. BRCA-related cancers include: ? Breast. ? Ovarian. ? Tubal. ? Peritoneal cancers.  Results of the assessment will determine the need for genetic counseling and BRCA1 and BRCA2 testing.  Cervical Cancer Your health care provider may recommend that you be screened regularly for cancer of the pelvic organs (ovaries, uterus, and vagina). This screening involves a pelvic examination, including checking for microscopic changes to the surface of your cervix (Pap test). You may be encouraged to have this screening done every 3 years, beginning at age 30.  For women ages 59-65, health care providers may recommend pelvic exams and Pap testing every 3 years, or they may recommend the Pap and pelvic exam, combined with testing for human papilloma virus (HPV), every 5 years. Some types of HPV increase your risk of cervical cancer. Testing for HPV may also be done on women of any age with unclear Pap test results.  Other health care providers may not recommend any screening for nonpregnant women who are considered low risk for pelvic cancer and who do not have symptoms. Ask your health care provider if a screening pelvic exam is right for you.  If you have had past treatment for cervical cancer or a condition that could lead to cancer, you need Pap tests and screening for cancer for at least 20 years after your treatment. If Pap tests have been discontinued, your risk factors (such as having a new sexual partner) need to be reassessed to determine if screening should resume. Some women have medical problems that increase the chance of getting cervical cancer. In these cases, your health care provider may recommend more frequent screening and Pap tests.  Colorectal Cancer  This type of cancer can be  detected and often prevented.  Routine colorectal cancer screening usually begins at 80 years of age and continues through 80 years of age.  Your health care provider may recommend screening at an earlier age if you have risk factors for colon cancer.  Your health care provider may also recommend using home test kits to check for hidden blood in the stool.  A small camera at the end of a tube can be used to examine your colon directly (sigmoidoscopy or colonoscopy). This is done to check for the earliest forms of colorectal cancer.  Routine screening usually begins at age 64.  Direct examination of the colon should be repeated every 5-10 years through 80 years of age. However, you may need to be screened more often if early forms of precancerous polyps or small growths are found.  Skin Cancer  Check your skin from head to toe regularly.  Tell your health care provider about any new moles or changes in moles, especially if there is a change in  a mole's shape or color.  Also tell your health care provider if you have a mole that is larger than the size of a pencil eraser.  Always use sunscreen. Apply sunscreen liberally and repeatedly throughout the day.  Protect yourself by wearing long sleeves, pants, a wide-brimmed hat, and sunglasses whenever you are outside.  Heart disease, diabetes, and high blood pressure  High blood pressure causes heart disease and increases the risk of stroke. High blood pressure is more likely to develop in: ? People who have blood pressure in the high end of the normal range (130-139/85-89 mm Hg). ? People who are overweight or obese. ? People who are African American.  If you are 11-33 years of age, have your blood pressure checked every 3-5 years. If you are 8 years of age or older, have your blood pressure checked every year. You should have your blood pressure measured twice-once when you are at a hospital or clinic, and once when you are not at a  hospital or clinic. Record the average of the two measurements. To check your blood pressure when you are not at a hospital or clinic, you can use: ? An automated blood pressure machine at a pharmacy. ? A home blood pressure monitor.  If you are between 20 years and 87 years old, ask your health care provider if you should take aspirin to prevent strokes.  Have regular diabetes screenings. This involves taking a blood sample to check your fasting blood sugar level. ? If you are at a normal weight and have a low risk for diabetes, have this test once every three years after 80 years of age. ? If you are overweight and have a high risk for diabetes, consider being tested at a younger age or more often. Preventing infection Hepatitis B  If you have a higher risk for hepatitis B, you should be screened for this virus. You are considered at high risk for hepatitis B if: ? You were born in a country where hepatitis B is common. Ask your health care provider which countries are considered high risk. ? Your parents were born in a high-risk country, and you have not been immunized against hepatitis B (hepatitis B vaccine). ? You have HIV or AIDS. ? You use needles to inject street drugs. ? You live with someone who has hepatitis B. ? You have had sex with someone who has hepatitis B. ? You get hemodialysis treatment. ? You take certain medicines for conditions, including cancer, organ transplantation, and autoimmune conditions.  Hepatitis C  Blood testing is recommended for: ? Everyone born from [birth year] through 1963. ? Anyone with known risk factors for hepatitis C.  Sexually transmitted infections (STIs)  You should be screened for sexually transmitted infections (STIs) including gonorrhea and chlamydia if: ? You are sexually active and are younger than 80 years of age. ? You are older than 80 years of age and your health care provider tells you that you are at risk for this type of  infection. ? Your sexual activity has changed since you were last screened and you are at an increased risk for chlamydia or gonorrhea. Ask your health care provider if you are at risk.  If you do not have HIV, but are at risk, it may be recommended that you take a prescription medicine daily to prevent HIV infection. This is called pre-exposure prophylaxis (PrEP). You are considered at risk if: ? You are sexually active and do not regularly use condoms  or know the HIV status of your partner(s). ? You take drugs by injection. ? You are sexually active with a partner who has HIV.  Talk with your health care provider about whether you are at high risk of being infected with HIV. If you choose to begin PrEP, you should first be tested for HIV. You should then be tested every 3 months for as long as you are taking PrEP. Pregnancy  If you are premenopausal and you may become pregnant, ask your health care provider about preconception counseling.  If you may become pregnant, take 400 to 800 micrograms (mcg) of folic acid every day.  If you want to prevent pregnancy, talk to your health care provider about birth control (contraception). Osteoporosis and menopause  Osteoporosis is a disease in which the bones lose minerals and strength with aging. This can result in serious bone fractures. Your risk for osteoporosis can be identified using a bone density scan.  If you are 33 years of age or older, or if you are at risk for osteoporosis and fractures, ask your health care provider if you should be screened.  Ask your health care provider whether you should take a calcium or vitamin D supplement to lower your risk for osteoporosis.  Menopause may have certain physical symptoms and risks.  Hormone replacement therapy may reduce some of these symptoms and risks. Talk to your health care provider about whether hormone replacement therapy is right for you. Follow these instructions at home:  Schedule  regular health, dental, and eye exams.  Stay current with your immunizations.  Do not use any tobacco products including cigarettes, chewing tobacco, or electronic cigarettes.  If you are pregnant, do not drink alcohol.  If you are breastfeeding, limit how much and how often you drink alcohol.  Limit alcohol intake to no more than 1 drink per day for nonpregnant women. One drink equals 12 ounces of beer, 5 ounces of wine, or 1 ounces of hard liquor.  Do not use street drugs.  Do not share needles.  Ask your health care provider for help if you need support or information about quitting drugs.  Tell your health care provider if you often feel depressed.  Tell your health care provider if you have ever been abused or do not feel safe at home. This information is not intended to replace advice given to you by your health care provider. Make sure you discuss any questions you have with your health care provider. Document Released: 10/07/2010 Document Revised: 08/30/2015 Document Reviewed: 12/26/2014 Elsevier Interactive Patient Education  [employer].

## 2016-11-06 NOTE — Assessment & Plan Note (Signed)
Unclear - med list has 67mcg daily with extra W/Sun however bottle she brings has 26mcg daily. TSH remaining elevated - will increase levothyroxine to 54mcg daily and reassess at 96mo lab visit.

## 2016-11-06 NOTE — Assessment & Plan Note (Signed)
On high dose lyrica.

## 2016-11-06 NOTE — Assessment & Plan Note (Signed)
Preventative protocols reviewed and updated unless pt declined. Discussed healthy diet and lifestyle.  

## 2016-11-06 NOTE — Progress Notes (Signed)
BP 112/74 (BP Location: Left Arm, Patient Position: Sitting, Cuff Size: Normal)   Pulse 72   Temp 97.7 F (36.5 C) (Oral)   Ht 4\' 11"  (1.499 m)   Wt 139 lb 12.8 oz (63.4 kg)   SpO2 96%   BMI 28.24 kg/m    CC: CPE Subjective:    Patient ID: Cheyenne [facility], female    DOB: 1934/06/02, 82 y.o.   MRN: [id number]  HPI: Cheyenne Pierce is a 82 y.o. female presenting on 11/06/2016 for Annual Exam   Saw Hoyle Sauer last week for medicare wellness visit. Note reviewed.    Oldest son has throat cancer - just started chemo and radiation. Pending port and feeding tube. [place].   S/p cervical radiofrequency ablation with good effect.   1 wk h/o chest congestion and sore throat, treating with multi symptoms cold OTC (tylenol, chlorpheniramine, dextromethrophan. No fevers/chils.   Preventative: COLONOSCOPY Date: 06/2014 int hem, rpt 5 yrs Cheyenne [facility]) Well woman with OBGYN - 2016 [facility] ([place]). Breast exam with OBGYN. Mammogram - 12/2015 normal per patient at [facility].  Dexa - 11/2011 osteopenia T-1.9 hip  Flu yearly Pneumovax 2006. prevnar 2015 Td 2007  zostavax 07/2011  shingrix - discussed Advanced directives: scanned and in chart 10/2015. HCPOA is daughter then sons. Does not want prolonged measures. Seat belt use discussed Sunscreen use discussed, requests referral for skin cancer screening Koleen Nimrod) Ex smoker quit 1980 Alcohol - none  Daily caffeine use 6 cups every day  Lives with husband  Grown children nearby  Activity: no regular exercise  Diet: good water, fruits daily, some vegetables   Relevant past medical, surgical, family and social history reviewed and updated as indicated. Interim medical history since our last visit reviewed. Allergies and medications reviewed and updated. Outpatient Medications Prior to Visit  Medication Sig Dispense Refill  . acetaminophen (TYLENOL) 500 MG tablet Take 500 mg by mouth every 6 (six) hours as needed.     Marland Kitchen amLODipine (NORVASC) 2.5 MG tablet Take 1 tablet (2.5 mg total) by mouth daily. 90 tablet 3  . aspirin EC 81 MG tablet Take 81 mg by mouth as needed.    . ASTRAGALUS PO Take 500 mg by mouth daily.    . Biotin 1000 MCG tablet Take 1,000 mcg by mouth daily.    . calcium-vitamin D (OSCAL WITH D) 500-200 MG-UNIT tablet Take 1 tablet by mouth.    . diphenhydrAMINE (BENADRYL) 25 mg capsule Take 25 mg by mouth at bedtime.     Marland Kitchen HYDROcodone-acetaminophen (NORCO/VICODIN) 5-325 MG tablet Take 0.5 tablets by mouth daily. 15 tablet 0  . HYDROcodone-acetaminophen (NORCO/VICODIN) 5-325 MG tablet Take 0.5 tablets by mouth daily. 15 tablet 0  . [START ON 12/06/2016] HYDROcodone-acetaminophen (NORCO/VICODIN) 5-325 MG tablet Take 0.5 tablets by mouth daily. 15 tablet 0  . hyoscyamine (LEVSIN, ANASPAZ) 0.125 MG tablet Take 1 tablet (0.125 mg total) by mouth 2 (two) times daily as needed. 30 tablet 3  . loperamide (IMODIUM) 2 MG capsule Take by mouth as needed for diarrhea or loose stools.    . Magnesium 250 MG TABS Take by mouth daily.    . meclizine (ANTIVERT) 25 MG tablet take 1 tablet by mouth three times a day if needed 30 tablet 1  . pregabalin (LYRICA) 150 MG capsule Take 1 capsule (150 mg total) by mouth 3 (three) times daily. 90 capsule 5  . triamterene-hydrochlorothiazide (MAXZIDE-25) 37.5-25 MG tablet take 1 tablet by mouth once  daily 30 tablet 11  . levothyroxine (LEVOXYL) 75 MCG tablet Take 75 mcg by mouth daily. Take extra pill on Wednesday and Sunday    . pantoprazole (PROTONIX) 40 MG tablet Take 1 tablet (40 mg total) by mouth daily. 90 tablet 3  . nitroGLYCERIN (NITROSTAT) 0.4 MG SL tablet Place 0.4 mg under the tongue every 5 (five) minutes as needed.     No facility-administered medications prior to visit.      Per HPI unless specifically indicated in ROS section below Review of Systems  Constitutional: Negative for activity change, appetite change, chills, fatigue, fever and unexpected  weight change.  HENT: Positive for sore throat (of 1 wk duration). Negative for hearing loss.   Eyes: Negative for visual disturbance.  Respiratory: Positive for cough (tessalon helps). Negative for chest tightness, shortness of breath and wheezing.   Cardiovascular: Positive for leg swelling (mild). Negative for chest pain and palpitations.  Gastrointestinal: Positive for diarrhea. Negative for abdominal distention, abdominal pain, blood in stool, constipation, nausea and vomiting.  Genitourinary: Negative for difficulty urinating and hematuria.  Musculoskeletal: Negative for arthralgias, myalgias and neck pain.  Skin: Negative for rash.  Neurological: Negative for dizziness, seizures, syncope and headaches.  Hematological: Negative for adenopathy. Does not bruise/bleed easily.  Psychiatric/Behavioral: Negative for dysphoric mood. The patient is not nervous/anxious.        Objective:    BP 112/74 (BP Location: Left Arm, Patient Position: Sitting, Cuff Size: Normal)   Pulse 72   Temp 97.7 F (36.5 C) (Oral)   Ht 4\' 11"  (1.499 m)   Wt 139 lb 12.8 oz (63.4 kg)   SpO2 96%   BMI 28.24 kg/m   Wt Readings from Last 3 Encounters:  11/06/16 139 lb 12.8 oz (63.4 kg)  10/28/16 139 lb 4 oz (63.2 kg)  10/07/16 139 lb (63 kg)    Physical Exam  Constitutional: She is oriented to person, place, and time. She appears well-developed and well-nourished. No distress.  HENT:  Head: Normocephalic and atraumatic.  Right Ear: Hearing, tympanic membrane, external ear and ear canal normal.  Left Ear: Hearing, tympanic membrane, external ear and ear canal normal.  Nose: Nose normal.  Mouth/Throat: Uvula is midline, oropharynx is clear and moist and mucous membranes are normal. No oropharyngeal exudate, posterior oropharyngeal edema or posterior oropharyngeal erythema.  Eyes: Pupils are equal, round, and reactive to light. Conjunctivae and EOM are normal. No scleral icterus.  Neck: Normal range of  motion. Neck supple.  Cardiovascular: Normal rate, regular rhythm, normal heart sounds and intact distal pulses.   No murmur heard. Pulses:      Radial pulses are 2+ on the right side, and 2+ on the left side.  Pulmonary/Chest: Effort normal and breath sounds normal. No respiratory distress. She has no wheezes. She has no rales.  Abdominal: Soft. Bowel sounds are normal. She exhibits no distension and no mass. Tenderness: mild. There is no rebound and no guarding.  Musculoskeletal: Normal range of motion. She exhibits edema (tr).  Lymphadenopathy:    She has no cervical adenopathy.  Neurological: She is alert and oriented to person, place, and time.  CN grossly intact, station and gait intact  Skin: Skin is warm and dry. No rash noted.  Psychiatric: She has a normal mood and affect. Her behavior is normal. Judgment and thought content normal.  Nursing note and vitals reviewed.  Results for orders placed or performed in visit on 10/28/16  Lipid panel  Result Value Ref Range  Cholesterol 203 (H) 0 - 200 mg/dL   Triglycerides 180.0 (H) 0.0 - 149.0 mg/dL   HDL 55.90 >39.00 mg/dL   VLDL 36.0 0.0 - 40.0 mg/dL   LDL Cholesterol 111 (H) 0 - 99 mg/dL   Total CHOL/HDL Ratio 4    NonHDL 147.35   TSH  Result Value Ref Range   TSH 5.71 (H) 0.35 - 4.50 uIU/mL  Comprehensive metabolic panel  Result Value Ref Range   Sodium 136 135 - 145 mEq/L   Potassium 3.8 3.5 - 5.1 mEq/L   Chloride 99 96 - 112 mEq/L   CO2 30 19 - 32 mEq/L   Glucose, Bld 92 70 - 99 mg/dL   BUN 11 6 - 23 mg/dL   Creatinine, Ser 0.71 0.40 - 1.20 mg/dL   Total Bilirubin 0.5 0.2 - 1.2 mg/dL   Alkaline Phosphatase 41 39 - 117 U/L   AST 113 (H) 0 - 37 U/L   ALT 17 0 - 35 U/L   Total Protein 6.9 6.0 - 8.3 g/dL   Albumin 4.3 3.5 - 5.2 g/dL   Calcium 9.9 8.4 - 10.5 mg/dL   GFR 84.12 >60.00 mL/min  T4, free  Result Value Ref Range   Free T4 0.76 0.60 - 1.60 ng/dL  Hemoglobin A1c  Result Value Ref Range   Hgb A1c MFr Bld  6.0 4.6 - 6.5 %      Assessment & Plan:  Patient requests referral to derm for skin cancer screening Problem List Items Addressed This Visit    Advanced care planning/counseling discussion    Advanced directives: scanned and in chart 10/2015. HCPOA is daughter then sons. Does not want prolonged measures.      Essential hypertension    Chronic, stable. Continue current regimen.       Health maintenance examination - Primary    Preventative protocols reviewed and updated unless pt declined. Discussed healthy diet and lifestyle.       Hereditary and idiopathic peripheral neuropathy    On high dose lyrica.       HLD (hyperlipidemia)    Chronic, deteriorated this year. Discussed healthy diet changes to improve triglyceride levels.  The ASCVD Risk score Mikey Bussing DC Jr., et al., 2013) failed to calculate for the following reasons:   The 2013 ASCVD risk score is only valid for ages 28 to 103       Hypothyroidism    Unclear - med list has 76mcg daily with extra W/Sun however bottle she brings has 60mcg daily. TSH remaining elevated - will increase levothyroxine to 81mcg daily and reassess at 13mo lab visit.        Relevant Medications   levothyroxine (LEVOXYL) 75 MCG tablet   MDD (major depressive disorder), recurrent episode, moderate (HCC)    Stable off meds.       Osteopenia    Consider updated dexa next year. Continue calcium/vit D.       Prediabetes    Discussed with patient, encouraged avoiding added sugars in diet      Viral URI with cough    Anticipate viral given short duration and improvement noted. Reviewed supportive care and red flags to seek further evaluation or update me.        Other Visit Diagnoses    Skin cancer screening       Relevant Orders   Ambulatory referral to Dermatology       Follow up plan: Return in about 6 months (around 05/09/2017) for follow up visit.  Ria Bush, MD

## 2016-11-06 NOTE — Assessment & Plan Note (Signed)
Chronic, stable. Continue current regimen. 

## 2016-11-06 NOTE — Assessment & Plan Note (Signed)
Anticipate viral given short duration and improvement noted. Reviewed supportive care and red flags to seek further evaluation or update me.

## 2016-11-06 NOTE — Assessment & Plan Note (Signed)
Stable off meds. ?

## 2016-11-06 NOTE — Assessment & Plan Note (Addendum)
Advanced directives: scanned and in chart 10/2015. HCPOA is daughter then sons. Does not want prolonged measures.  

## 2016-11-06 NOTE — Assessment & Plan Note (Signed)
Chronic, deteriorated this year. Discussed healthy diet changes to improve triglyceride levels.  The ASCVD Risk score Mikey Bussing DC Jr., et al., 2013) failed to calculate for the following reasons:   The 2013 ASCVD risk score is only valid for ages 23 to 35

## 2016-11-06 NOTE — Assessment & Plan Note (Signed)
Consider updated dexa next year. Continue calcium/vit D.

## 2016-11-06 NOTE — Assessment & Plan Note (Signed)
Discussed with patient, encouraged avoiding added sugars in diet

## 2016-11-13 ENCOUNTER — Telehealth: Payer: Self-pay

## 2016-11-13 NOTE — Telephone Encounter (Signed)
Pt left v/m requesting 10/28/16 lab results and AVS for 11/06/16 visit mailed to [place] [id number]. done

## 2016-11-24 ENCOUNTER — Ambulatory Visit (INDEPENDENT_AMBULATORY_CARE_PROVIDER_SITE_OTHER): Payer: Medicare Other | Admitting: Family Medicine

## 2016-11-24 ENCOUNTER — Encounter: Payer: Self-pay | Admitting: Family Medicine

## 2016-11-24 VITALS — BP 106/70 | HR 78 | Temp 97.8°F | Wt 149.5 lb

## 2016-11-24 DIAGNOSIS — R300 Dysuria: Secondary | ICD-10-CM | POA: Diagnosis not present

## 2016-11-24 LAB — POC URINALSYSI DIPSTICK (AUTOMATED)
Bilirubin, UA: NEGATIVE
GLUCOSE UA: NEGATIVE
Ketones, UA: NEGATIVE
NITRITE UA: NEGATIVE
PH UA: 7 (ref 5.0–8.0)
PROTEIN UA: NEGATIVE
Spec Grav, UA: 1.015 (ref 1.010–1.025)
UROBILINOGEN UA: 0.2 U/dL

## 2016-11-24 MED ORDER — SULFAMETHOXAZOLE-TRIMETHOPRIM 400-80 MG PO TABS: 1.0000 | ORAL_TABLET | Freq: Two times a day (BID) | ORAL | 0 refills | Status: DC

## 2016-11-24 NOTE — Progress Notes (Signed)
Sx started about 1 week ago.  Started with pain with urination.  Then started OTC cranberry juice.  Cloudy urine.  No vomiting.  Some nausea.  abd ttp in lower abdomen.  Still with back pain, more lower  Back pain.  No fevers.    Meds, vitals, and allergies reviewed.   Per HPI unless specifically indicated in ROS section   GEN: nad, alert and oriented HEENT: mucous membranes moist NECK: supple CV: rrr.  PULM: ctab, no inc wob ABD: soft, +bs, suprapubic area tender EXT: no edema SKIN: no acute rash BACK: no CVA pain Walking with walker at baseline.

## 2016-11-24 NOTE — Patient Instructions (Addendum)
Drink plenty of water and start the antibiotics today.  We'll contact you with your lab report.  Take care.   

## 2016-11-25 DIAGNOSIS — R300 Dysuria: Secondary | ICD-10-CM | POA: Insufficient documentation

## 2016-11-25 NOTE — Assessment & Plan Note (Signed)
Likely cystitis. Check urine culture. Start Septra. Urinalysis discussed with patient. See after visit summary. Okay for outpatient follow-up.

## 2016-11-26 LAB — URINE CULTURE

## 2017-01-07 ENCOUNTER — Other Ambulatory Visit: Payer: Self-pay | Admitting: Family Medicine

## 2017-01-07 ENCOUNTER — Other Ambulatory Visit (INDEPENDENT_AMBULATORY_CARE_PROVIDER_SITE_OTHER): Payer: Medicare Other

## 2017-01-07 DIAGNOSIS — E039 Hypothyroidism, unspecified: Secondary | ICD-10-CM | POA: Diagnosis not present

## 2017-01-07 DIAGNOSIS — R7401 Elevation of levels of liver transaminase levels: Secondary | ICD-10-CM

## 2017-01-07 DIAGNOSIS — R740 Nonspecific elevation of levels of transaminase and lactic acid dehydrogenase [LDH]: Secondary | ICD-10-CM | POA: Diagnosis not present

## 2017-01-08 ENCOUNTER — Other Ambulatory Visit: Payer: Self-pay | Admitting: Family Medicine

## 2017-01-08 LAB — TSH: TSH: 0.47 u[IU]/mL (ref 0.35–4.50)

## 2017-01-08 LAB — HEPATIC FUNCTION PANEL
ALT: 17 U/L (ref 0–35)
AST: 100 U/L — ABNORMAL HIGH (ref 0–37)
Albumin: 4.3 g/dL (ref 3.5–5.2)
Alkaline Phosphatase: 45 U/L (ref 39–117)
BILIRUBIN DIRECT: 0.1 mg/dL (ref 0.0–0.3)
TOTAL PROTEIN: 7.1 g/dL (ref 6.0–8.3)
Total Bilirubin: 0.4 mg/dL (ref 0.2–1.2)

## 2017-01-08 LAB — T4, FREE: Free T4: 1.34 ng/dL (ref 0.60–1.60)

## 2017-01-09 ENCOUNTER — Other Ambulatory Visit: Payer: Medicare Other

## 2017-01-16 ENCOUNTER — Ambulatory Visit: Payer: Medicare Other

## 2017-01-21 ENCOUNTER — Ambulatory Visit (INDEPENDENT_AMBULATORY_CARE_PROVIDER_SITE_OTHER): Payer: Medicare Other

## 2017-01-21 DIAGNOSIS — Z23 Encounter for immunization: Secondary | ICD-10-CM

## 2017-01-29 ENCOUNTER — Ambulatory Visit: Payer: Medicare Other

## 2017-01-29 ENCOUNTER — Ambulatory Visit: Payer: Medicare Other | Attending: Nurse Practitioner | Admitting: Nurse Practitioner

## 2017-01-29 ENCOUNTER — Encounter: Payer: Self-pay | Admitting: Nurse Practitioner

## 2017-01-29 VITALS — BP 130/85 | HR 72 | Temp 97.6°F | Resp 18 | Ht 60.0 in | Wt 135.0 lb

## 2017-01-29 DIAGNOSIS — R531 Weakness: Secondary | ICD-10-CM | POA: Diagnosis not present

## 2017-01-29 DIAGNOSIS — M5412 Radiculopathy, cervical region: Secondary | ICD-10-CM

## 2017-01-29 DIAGNOSIS — Z7982 Long term (current) use of aspirin: Secondary | ICD-10-CM | POA: Insufficient documentation

## 2017-01-29 DIAGNOSIS — Z880 Allergy status to penicillin: Secondary | ICD-10-CM | POA: Insufficient documentation

## 2017-01-29 DIAGNOSIS — G8929 Other chronic pain: Secondary | ICD-10-CM | POA: Diagnosis not present

## 2017-01-29 DIAGNOSIS — F411 Generalized anxiety disorder: Secondary | ICD-10-CM | POA: Insufficient documentation

## 2017-01-29 DIAGNOSIS — K589 Irritable bowel syndrome without diarrhea: Secondary | ICD-10-CM | POA: Diagnosis not present

## 2017-01-29 DIAGNOSIS — Z90710 Acquired absence of both cervix and uterus: Secondary | ICD-10-CM | POA: Insufficient documentation

## 2017-01-29 DIAGNOSIS — M5116 Intervertebral disc disorders with radiculopathy, lumbar region: Secondary | ICD-10-CM | POA: Diagnosis not present

## 2017-01-29 DIAGNOSIS — I10 Essential (primary) hypertension: Secondary | ICD-10-CM | POA: Diagnosis not present

## 2017-01-29 DIAGNOSIS — G894 Chronic pain syndrome: Secondary | ICD-10-CM | POA: Insufficient documentation

## 2017-01-29 DIAGNOSIS — M792 Neuralgia and neuritis, unspecified: Secondary | ICD-10-CM

## 2017-01-29 DIAGNOSIS — M48061 Spinal stenosis, lumbar region without neurogenic claudication: Secondary | ICD-10-CM | POA: Diagnosis not present

## 2017-01-29 DIAGNOSIS — R05 Cough: Secondary | ICD-10-CM | POA: Insufficient documentation

## 2017-01-29 DIAGNOSIS — K4090 Unilateral inguinal hernia, without obstruction or gangrene, not specified as recurrent: Secondary | ICD-10-CM | POA: Insufficient documentation

## 2017-01-29 DIAGNOSIS — M4802 Spinal stenosis, cervical region: Secondary | ICD-10-CM | POA: Insufficient documentation

## 2017-01-29 DIAGNOSIS — Z9841 Cataract extraction status, right eye: Secondary | ICD-10-CM | POA: Insufficient documentation

## 2017-01-29 DIAGNOSIS — K219 Gastro-esophageal reflux disease without esophagitis: Secondary | ICD-10-CM | POA: Insufficient documentation

## 2017-01-29 DIAGNOSIS — Z87891 Personal history of nicotine dependence: Secondary | ICD-10-CM | POA: Insufficient documentation

## 2017-01-29 DIAGNOSIS — M797 Fibromyalgia: Secondary | ICD-10-CM | POA: Diagnosis not present

## 2017-01-29 DIAGNOSIS — Z79891 Long term (current) use of opiate analgesic: Secondary | ICD-10-CM | POA: Insufficient documentation

## 2017-01-29 DIAGNOSIS — M25551 Pain in right hip: Secondary | ICD-10-CM

## 2017-01-29 DIAGNOSIS — I493 Ventricular premature depolarization: Secondary | ICD-10-CM | POA: Diagnosis not present

## 2017-01-29 DIAGNOSIS — G629 Polyneuropathy, unspecified: Secondary | ICD-10-CM | POA: Insufficient documentation

## 2017-01-29 DIAGNOSIS — E739 Lactose intolerance, unspecified: Secondary | ICD-10-CM | POA: Insufficient documentation

## 2017-01-29 DIAGNOSIS — Z86010 Personal history of colonic polyps: Secondary | ICD-10-CM | POA: Insufficient documentation

## 2017-01-29 DIAGNOSIS — G609 Hereditary and idiopathic neuropathy, unspecified: Secondary | ICD-10-CM | POA: Diagnosis not present

## 2017-01-29 DIAGNOSIS — M8580 Other specified disorders of bone density and structure, unspecified site: Secondary | ICD-10-CM | POA: Insufficient documentation

## 2017-01-29 DIAGNOSIS — Z79899 Other long term (current) drug therapy: Secondary | ICD-10-CM | POA: Insufficient documentation

## 2017-01-29 DIAGNOSIS — E785 Hyperlipidemia, unspecified: Secondary | ICD-10-CM | POA: Diagnosis not present

## 2017-01-29 DIAGNOSIS — Z885 Allergy status to narcotic agent status: Secondary | ICD-10-CM | POA: Insufficient documentation

## 2017-01-29 DIAGNOSIS — Z8719 Personal history of other diseases of the digestive system: Secondary | ICD-10-CM | POA: Insufficient documentation

## 2017-01-29 DIAGNOSIS — Z823 Family history of stroke: Secondary | ICD-10-CM | POA: Insufficient documentation

## 2017-01-29 DIAGNOSIS — K449 Diaphragmatic hernia without obstruction or gangrene: Secondary | ICD-10-CM | POA: Insufficient documentation

## 2017-01-29 DIAGNOSIS — E039 Hypothyroidism, unspecified: Secondary | ICD-10-CM | POA: Diagnosis not present

## 2017-01-29 DIAGNOSIS — Z8249 Family history of ischemic heart disease and other diseases of the circulatory system: Secondary | ICD-10-CM | POA: Insufficient documentation

## 2017-01-29 DIAGNOSIS — R7303 Prediabetes: Secondary | ICD-10-CM | POA: Insufficient documentation

## 2017-01-29 DIAGNOSIS — M5416 Radiculopathy, lumbar region: Secondary | ICD-10-CM | POA: Diagnosis not present

## 2017-01-29 MED ORDER — HYDROCODONE-ACETAMINOPHEN 5-325 MG PO TABS: 1.0000 | ORAL_TABLET | Freq: Every day | ORAL | 0 refills | Status: DC

## 2017-01-29 MED ORDER — PREGABALIN 150 MG PO CAPS: 150.0000 mg | ORAL_CAPSULE | Freq: Three times a day (TID) | ORAL | 2 refills | Status: DC

## 2017-01-29 NOTE — Progress Notes (Signed)
Nursing Pain Medication Assessment:  Safety precautions to be maintained throughout the outpatient stay will include: orient to surroundings, keep bed in low position, maintain call bell within reach at all times, provide assistance with transfer out of bed and ambulation.  Medication Inspection Compliance: Pill count conducted under aseptic conditions, in front of the patient. Neither the pills nor the bottle was removed from the patient's sight at any time. Once count was completed pills were immediately returned to the patient in their original bottle.  Medication: Hydrocodone/APAP Pill/Patch Count: 0 of 15 pills remain Pill/Patch Appearance: Markings consistent with prescribed medication Bottle Appearance: Standard pharmacy container. Clearly labeled. Filled Date: 09 / 12 / 2018 Last Medication intake:  Yesterday

## 2017-01-29 NOTE — Patient Instructions (Signed)

## 2017-01-29 NOTE — Progress Notes (Signed)
Patient's Name: Cheyenne Pierce  MRN: [id number]  Referring Provider: Ria Bush, MD  DOB: [birth year]  PCP: Ria Bush, MD  DOS: 01/29/2017  Note by: Vevelyn Francois NP  Service setting: Ambulatory outpatient  Specialty: Interventional Pain Management  Location: [facility] (AMB) Pain Management Facility    Patient type: Established    Primary Reason(s) for Visit: Encounter for prescription drug management. (Level of risk: moderate)  CC: Back Pain (low) and Foot Pain (bilateral)  HPI  Cheyenne Pierce is a 82 y.o. year old, female patient, who comes today for a medication management evaluation. She has Hypothyroidism; GAD (generalized anxiety disorder); Hereditary and idiopathic peripheral neuropathy; Essential hypertension; Reflux esophagitis; GERD; INGUINAL HERNIA, LEFT; Irritable bowel syndrome; Other chronic cystitis; Osteopenia; Systolic murmur; Medicare annual wellness visit, subsequent; PVCs (premature ventricular contractions); Prediabetes; Memory deficit; Advanced care planning/counseling discussion; HLD (hyperlipidemia); Health maintenance examination; Recurrent falls; Night sweats; Chronic pain syndrome; Encounter for therapeutic drug level monitoring; Encounter for chronic pain management; Chronic midline low back pain with left-sided sciatica; Chronic neck pain (Location of Primary Source of Pain) (Bilateral) (R>L); Cervical spondylosis; Chronic cervical radicular pain (Bilateral) (R>L); Numbness of upper extremity (Bilateral) (R>L); Upper extremity weakness (Bilateral); Neurogenic pain; Neuropathic pain; Inflammatory pain; Chronic Lumbar Radicular pain (Location of Secondary source of pain) (S1) (Left); Allodynia; Encounter for long-term current use of medication; Lumbar spondylosis; Chronic Lumbar Radiculopathy (Left); Chronic lower extremity pain (Bilateral) (L>R); Chronic hip pain (Location of Tertiary source of pain) (Right); Abnormal Cervical MRI (01/24/15); Cervical spinal stenosis (C3  through C7); Cervical facet hypertrophy (Bilateral); Cervical foraminal stenosis (C7-T1) (Bilateral); Chronic cervical radiculopathy (Bilateral) (R>L) (pain, numbness, and weakness); Abnormal Lumbar MRI (11/10/2014); Lumbar spinal stenosis (L3-4); Lumbar foraminal stenosis (Bilateral L3-4) (Right L4-5); MDD (major depressive disorder), recurrent episode, moderate ([place]); Fibromyalgia; Neck pain on right side; Cervical facet syndrome (Right); Cervical fusion syndrome (C3-C7 ACDF); Primary osteoarthritis involving multiple joints; Acute postoperative pain; Long term (current) use of opiate analgesic; Long term prescription opiate use; Opiate use; Viral URI with cough; and Dysuria on her problem list. Her primarily concern today is the Back Pain (low) and Foot Pain (bilateral)  Pain Assessment: Location: Lower Back Radiating: feet Onset: More than a month ago Duration: Chronic pain Quality: Burning, Tingling, Aching, Constant Severity: 6 /10 (self-reported pain score)  Note: Reported level is compatible with observation.                   When using our objective Pain Scale, levels between 6 and 10/10 are said to belong in an emergency room, as it progressively worsens from a 6/10, described as severely limiting, requiring emergency care not usually available at an outpatient pain management facility. At a 6/10 level, communication becomes difficult and requires great effort. Assistance to reach the emergency department may be required. Facial flushing and profuse sweating along with potentially dangerous increases in heart rate and blood pressure will be evident. Effect on ADL:   Timing: Constant Modifying factors: medications, max-freeze at night, warmth  Cheyenne Pierce was last scheduled for an appointment on Visit date not found for medication management. During today's appointment we reviewed Cheyenne Pierce's chronic pain status, as well as her outpatient medication regimen. She states that she took her last  Norco last nigh. She admits that she only uses them as needed.However when she does a lot of walking it really makes things worse. She states that she does try to use 0.5 tab if possible. She does have constipation and stool softer.  She admits that she uses alot of water. She is concern about the cost of her medication; Lyrica. She admits that it very effective. She has tried to reduce the use but was informed by the doctor not to this.  The patient  reports that she does not use drugs. Her body mass index is 26.37 kg/m.  Further details on both, my assessment(s), as well as the proposed treatment plan, please see below.  Controlled Substance Pharmacotherapy Assessment REMS (Risk Evaluation and Mitigation Strategy)  Analgesic: Norco 5/317m 0.5 tablets daily MME/day: 15 mg/day.  SHart Rochester RN  01/29/2017  1:04 PM  Sign at close encounter Nursing Pain Medication Assessment:  Safety precautions to be maintained throughout the outpatient stay will include: orient to surroundings, keep bed in low position, maintain call bell within reach at all times, provide assistance with transfer out of bed and ambulation.  Medication Inspection Compliance: Pill count conducted under aseptic conditions, in front of the patient. Neither the pills nor the bottle was removed from the patient's sight at any time. Once count was completed pills were immediately returned to the patient in their original bottle.  Medication: Hydrocodone/APAP Pill/Patch Count: 0 of 15 pills remain Pill/Patch Appearance: Markings consistent with prescribed medication Bottle Appearance: Standard pharmacy container. Clearly labeled. Filled Date: 09 / 12 / 2018 Last Medication intake:  Yesterday   Pharmacokinetics: Liberation and absorption (onset of action): WNL Distribution (time to peak effect): WNL Metabolism and excretion (duration of action): WNL         Pharmacodynamics: Desired effects: Analgesia: Ms. SAvery reports >50% benefit. Functional ability: Patient reports that medication allows her to accomplish basic ADLs Clinically meaningful improvement in function (CMIF): Sustained CMIF goals met Perceived effectiveness: Described as relatively effective, allowing for increase in activities of daily living (ADL) Undesirable effects: Side-effects or Adverse reactions: None reported Monitoring: [place] PMP: Online review of the past 152-montheriod conducted. Compliant with practice rules and regulations Last UDS on record: No results found for: SUMMARY UDS interpretation: Compliant          Medication Assessment Form: Reviewed. Patient indicates being compliant with therapy Treatment compliance: Compliant Risk Assessment Profile: Aberrant behavior: See prior evaluations. None observed or detected today Comorbid factors increasing risk of overdose: See prior notes. No additional risks detected today Risk of substance use disorder (SUD): Low  ORT Scoring interpretation table:  Score <3 = Low Risk for SUD  Score between 4-7 = Moderate Risk for SUD  Score >8 = High Risk for Opioid Abuse   Risk Mitigation Strategies:  Patient Counseling: Covered Patient-Prescriber Agreement (PPA): Present and active  Notification to other healthcare providers: Done  Pharmacologic Plan: No change in therapy, at this time  Laboratory Chemistry  Inflammation Markers (CRP: Acute Phase) (ESR: Chronic Phase) Lab Results  Component Value Date   CRP 0.7 09/12/2014   ESRSEDRATE 11 01/07/2016                 Renal Function Markers Lab Results  Component Value Date   BUN 11 10/28/2016   CREATININE 0.71 10/28/2016   GFRAA >60 05/22/2015   GFRNONAA >60 05/22/2015                 Hepatic Function Markers Lab Results  Component Value Date   AST 100 (H) 01/07/2017   ALT 17 01/07/2017   ALBUMIN 4.3 01/07/2017   ALKPHOS 45 01/07/2017  Electrolytes Lab Results  Component Value Date   NA 136  10/28/2016   K 3.8 10/28/2016   CL 99 10/28/2016   CALCIUM 9.9 10/28/2016                 Neuropathy Markers Lab Results  Component Value Date   VITAMINB12 >1500 (H) 09/22/2012                 Bone Pathology Markers Lab Results  Component Value Date   ALKPHOS 45 01/07/2017   VD25OH 47.39 10/04/2013   CALCIUM 9.9 10/28/2016                 Coagulation Parameters Lab Results  Component Value Date   INR 1.0 11/08/2012   LABPROT 13.8 11/08/2012   APTT 32.6 11/08/2012   PLT 337.0 01/07/2016                 Cardiovascular Markers Lab Results  Component Value Date   HGB 13.8 01/07/2016   HCT 40.9 01/07/2016                 Note: Lab results reviewed.  Recent Diagnostic Imaging Results  DG C-Arm 1-60 Min-No Report Fluoroscopy was utilized by the requesting physician.  No radiographic  interpretation.   Complexity Note: Imaging results reviewed. Results shared with Cheyenne Pierce, using Layman's terms.                         Meds   Current Outpatient Prescriptions:  .  acetaminophen (TYLENOL) 500 MG tablet, Take 500 mg by mouth every 6 (six) hours as needed., Disp: , Rfl:  .  amLODipine (NORVASC) 2.5 MG tablet, Take 1 tablet (2.5 mg total) by mouth daily., Disp: 90 tablet, Rfl: 3 .  aspirin EC 81 MG tablet, Take 81 mg by mouth as needed., Disp: , Rfl:  .  ASTRAGALUS PO, Take 500 mg by mouth daily., Disp: , Rfl:  .  Biotin 1000 MCG tablet, Take 1,000 mcg by mouth daily., Disp: , Rfl:  .  calcium-vitamin D (OSCAL WITH D) 500-200 MG-UNIT tablet, Take 1 tablet by mouth., Disp: , Rfl:  .  diphenhydrAMINE (BENADRYL) 25 mg capsule, Take 25 mg by mouth at bedtime. , Disp: , Rfl:  .  HYDROcodone-acetaminophen (NORCO/VICODIN) 5-325 MG tablet, Take 1 tablet by mouth daily., Disp: 30 tablet, Rfl: 0 .  [START ON 02/28/2017] HYDROcodone-acetaminophen (NORCO/VICODIN) 5-325 MG tablet, Take 1 tablet by mouth daily., Disp: 30 tablet, Rfl: 0 .  hyoscyamine (LEVSIN, ANASPAZ) 0.125 MG  tablet, Take 1 tablet (0.125 mg total) by mouth 2 (two) times daily as needed., Disp: 30 tablet, Rfl: 3 .  levothyroxine (LEVOXYL) 75 MCG tablet, Take 1 tablet (75 mcg total) by mouth daily., Disp: 30 tablet, Rfl: 6 .  loperamide (IMODIUM) 2 MG capsule, Take by mouth as needed for diarrhea or loose stools., Disp: , Rfl:  .  Magnesium 250 MG TABS, Take by mouth daily., Disp: , Rfl:  .  meclizine (ANTIVERT) 25 MG tablet, take 1 tablet by mouth three times a day if needed, Disp: 30 tablet, Rfl: 1 .  nitroGLYCERIN (NITROSTAT) 0.4 MG SL tablet, Place 0.4 mg under the tongue every 5 (five) minutes as needed., Disp: , Rfl:  .  pantoprazole (PROTONIX) 40 MG tablet, Take 1 tablet (40 mg total) by mouth daily., Disp: 30 tablet, Rfl: 11 .  pregabalin (LYRICA) 150 MG capsule, Take 1 capsule (150 mg total) by mouth  3 (three) times daily., Disp: 90 capsule, Rfl: 2 .  triamterene-hydrochlorothiazide (MAXZIDE-25) 37.5-25 MG tablet, take 1 tablet by mouth once daily, Disp: 30 tablet, Rfl: 11 .  [START ON 03/30/2017] HYDROcodone-acetaminophen (NORCO/VICODIN) 5-325 MG tablet, Take 1 tablet by mouth daily., Disp: 30 tablet, Rfl: 0  ROS  Constitutional: Denies any fever or chills Gastrointestinal: No reported hemesis, hematochezia, vomiting, or acute GI distress Musculoskeletal: Denies any acute onset joint swelling, redness, loss of ROM, or weakness Neurological: No reported episodes of acute onset apraxia, aphasia, dysarthria, agnosia, amnesia, paralysis, loss of coordination, or loss of consciousness  Allergies  Cheyenne Pierce is allergic to codeine; penicillins; prednisone; zoloft [sertraline hcl]; amoxicillin; codeine phosphate; lactose intolerance (gi); metronidazole; perindopril erbumine; potassium-containing compounds; prozac [fluoxetine hcl]; tramadol hcl; and oxcarbazepine.  PFSH  Drug: Cheyenne Pierce  reports that she does not use drugs. Alcohol:  reports that she does not drink alcohol. Tobacco:  reports that  she has quit smoking. She has a 30.00 pack-year smoking history. She has never used smokeless tobacco. Medical:  has a past medical history of Allergy; Angina pectoris ([place]); Anxiety; Cataract; Chronic back pain; DDD (degenerative disc disease), cervical; DDD (degenerative disc disease), cervical; DDD (degenerative disc disease), lumbar; Dental bridge present; Depression; Diverticulosis; GERD (gastroesophageal reflux disease); Headache; Heart murmur; History of bronchitis; History of colon polyps; History of gastric ulcer; History of hiatal hernia; Hypertension; Hypothyroidism; IBS (irritable bowel syndrome); Insomnia; Joint pain; Joint swelling; Mass of vagina; Nocturia; Peripheral neuropathy; Peripheral neuropathy; Urinary frequency; Urinary urgency; and Vertigo. Surgical: Cheyenne Pierce  has a past surgical history that includes Appendectomy (1948); Abdominal hysterectomy (1968); Ovarian cyst surgery (1963); Hernia repair; Colostomy w/ rectocele repair (06/2004); Ventral hernia repair (08/2008); Colonoscopy (07/2009); epidural steroid injections (multiple latest 05/2014, 07/2014); US ECHOCARDIOGRAPHY (2009); NM MYOVIEW LTD (2009); DEXA (2013); Colonoscopy (06/2014); Tonsillectomy; Cardiac catheterization (09/26/1998); bladder suspension with vaginal sling; epidural infections; Esophagogastroduodenoscopy; Anterior cervical decompression/discectomy fusion 4 level (N/A, 06/05/2015); Cataract extraction w/PHACO (Left, 10/15/2015); Eye surgery (Left, 10/15/2015); and Cataract extraction w/PHACO (Right, 11/26/2015). Family: family history includes Heart disease in her mother; Stroke in her brother.  Constitutional Exam  General appearance: Well nourished, well developed, and well hydrated. In no apparent acute distress Vitals:   01/29/17 1255  BP: 130/85  Pulse: 72  Resp: 18  Temp: 97.6 F (36.4 C)  TempSrc: Oral  SpO2: 99%  Weight: 135 lb (61.2 kg)  Height: 5' (1.524 m)   BMI Assessment: Estimated body mass  index is 26.37 kg/m as calculated from the following:   Height as of this encounter: 5' (1.524 m).   Weight as of this encounter: 135 lb (61.2 kg).  BMI interpretation table: BMI level Category Range association with higher incidence of chronic pain  <18 kg/m2 Underweight   18.5-24.9 kg/m2 Ideal body weight   25-29.9 kg/m2 Overweight Increased incidence by 20%  30-34.9 kg/m2 Obese (Class I) Increased incidence by 68%  35-39.9 kg/m2 Severe obesity (Class II) Increased incidence by 136%  >40 kg/m2 Extreme obesity (Class III) Increased incidence by 254%   BMI Readings from Last 4 Encounters:  01/29/17 26.37 kg/m  11/24/16 30.20 kg/m  11/06/16 28.24 kg/m  10/28/16 28.13 kg/m   Wt Readings from Last 4 Encounters:  01/29/17 135 lb (61.2 kg)  11/24/16 149 lb 8 oz (67.8 kg)  11/06/16 139 lb 12.8 oz (63.4 kg)  10/28/16 139 lb 4 oz (63.2 kg)  Psych/Mental status: Alert, oriented x 3 (person, place, & time)  Eyes: PERLA Respiratory: No evidence of acute respiratory distress  Cervical Spine Area Exam  Skin & Axial Inspection: No masses, redness, edema, swelling, or associated skin lesions Alignment: Symmetrical Functional ROM: Unrestricted ROM      Stability: No instability detected Muscle Tone/Strength: Functionally intact. No obvious neuro-muscular anomalies detected. Sensory (Neurological): Unimpaired Palpation: No palpable anomalies              Upper Extremity (UE) Exam    Side: Right upper extremity  Side: Left upper extremity  Skin & Extremity Inspection: Skin color, temperature, and hair growth are WNL. No peripheral edema or cyanosis. No masses, redness, swelling, asymmetry, or associated skin lesions. No contractures.  Skin & Extremity Inspection: Skin color, temperature, and hair growth are WNL. No peripheral edema or cyanosis. No masses, redness, swelling, asymmetry, or associated skin lesions. No contractures.  Functional ROM: Unrestricted ROM          Functional  ROM: Unrestricted ROM          Muscle Tone/Strength: Functionally intact. No obvious neuro-muscular anomalies detected.  Muscle Tone/Strength: Functionally intact. No obvious neuro-muscular anomalies detected.  Sensory (Neurological): Unimpaired          Sensory (Neurological): Unimpaired          Palpation: No palpable anomalies              Palpation: No palpable anomalies              Specialized Test(s): Deferred         Specialized Test(s): Deferred          Thoracic Spine Area Exam  Skin & Axial Inspection: No masses, redness, or swelling Alignment: Symmetrical Functional ROM: Unrestricted ROM Stability: No instability detected Muscle Tone/Strength: Functionally intact. No obvious neuro-muscular anomalies detected. Sensory (Neurological): Unimpaired Muscle strength & Tone: No palpable anomalies  Lumbar Spine Area Exam  Skin & Axial Inspection: No masses, redness, or swelling Alignment: Symmetrical Functional ROM: Unrestricted ROM      Stability: No instability detected Muscle Tone/Strength: Functionally intact. No obvious neuro-muscular anomalies detected. Sensory (Neurological): Unimpaired Palpation: No palpable anomalies       Provocative Tests: Lumbar Hyperextension and rotation test: evaluation deferred today       Lumbar Lateral bending test: evaluation deferred today       Patrick's Maneuver: evaluation deferred today                    Gait & Posture Assessment  Ambulation: Patient ambulates using a walker Gait: Relatively normal for age and body habitus Posture: WNL   Lower Extremity Exam    Side: Right lower extremity  Side: Left lower extremity  Skin & Extremity Inspection: Skin color, temperature, and hair growth are WNL. No peripheral edema or cyanosis. No masses, redness, swelling, asymmetry, or associated skin lesions. No contractures.  Skin & Extremity Inspection: Skin color, temperature, and hair growth are WNL. No peripheral edema or cyanosis. No masses,  redness, swelling, asymmetry, or associated skin lesions. No contractures.  Functional ROM: Unrestricted ROM          Functional ROM: Unrestricted ROM          Muscle Tone/Strength: Functionally intact. No obvious neuro-muscular anomalies detected.  Muscle Tone/Strength: Functionally intact. No obvious neuro-muscular anomalies detected.  Sensory (Neurological): Unimpaired  Sensory (Neurological): Unimpaired  Palpation: No palpable anomalies  Palpation: No palpable anomalies   Assessment  Primary Diagnosis & Pertinent Problem List: The primary encounter diagnosis was Chronic  Lumbar Radicular pain (Location of Secondary source of pain) (S1) (Left). Diagnoses of Chronic hip pain (Location of Tertiary source of pain) (Right), Chronic cervical radicular pain (Bilateral) (R>L), Chronic pain syndrome, Fibromyalgia, Neurogenic pain, and Long term (current) use of opiate analgesic were also pertinent to this visit.  Status Diagnosis  Controlled Controlled Controlled 1. Chronic Lumbar Radicular pain (Location of Secondary source of pain) (S1) (Left)   2. Chronic hip pain (Location of Tertiary source of pain) (Right)   3. Chronic cervical radicular pain (Bilateral) (R>L)   4. Chronic pain syndrome   5. Fibromyalgia   6. Neurogenic pain   7. Long term (current) use of opiate analgesic     Problems updated and reviewed during this visit: No problems updated. Plan of Care  Pharmacotherapy (Medications Ordered): Meds ordered this encounter  Medications  . HYDROcodone-acetaminophen (NORCO/VICODIN) 5-325 MG tablet    Sig: Take 1 tablet by mouth daily.    Dispense:  30 tablet    Refill:  0    Fill one day early if pharmacy is closed on scheduled refill date. Do not fill until:01/29/2017 To last until: 02/28/2017    Order Specific Question:   Supervising Provider    Answer:   Milinda Pointer [phone]  . HYDROcodone-acetaminophen (NORCO/VICODIN) 5-325 MG tablet    Sig: Take 1 tablet by mouth  daily.    Dispense:  30 tablet    Refill:  0    Fill one day early if pharmacy is closed on scheduled refill date. Do not fill until: 02/28/2017 To last until: 03/30/2017    Order Specific Question:   Supervising Provider    Answer:   Milinda Pointer [phone]  . HYDROcodone-acetaminophen (NORCO/VICODIN) 5-325 MG tablet    Sig: Take 1 tablet by mouth daily.    Dispense:  30 tablet    Refill:  0    Fill one day early if pharmacy is closed on scheduled refill date. Do not fill until: 03/30/2017 To last until: 04/29/2017    Order Specific Question:   Supervising Provider    Answer:   Milinda Pointer [phone]  . pregabalin (LYRICA) 150 MG capsule    Sig: Take 1 capsule (150 mg total) by mouth 3 (three) times daily.    Dispense:  90 capsule    Refill:  2    Do not place this medication, or any other prescription from our practice, on "Automatic Refill". Patient may have prescription filled one day early if pharmacy is closed on scheduled refill date.    Order Specific Question:   Supervising Provider    Answer:   Milinda Pointer [[id number]]   New Prescriptions   No medications on file   Medications administered today: Cheyenne Pierce had no medications administered during this visit. Lab-work, procedure(s), and/or referral(s): Orders Placed This Encounter  Procedures  . ToxASSURE Select 13 (MW), Urine   Imaging and/or referral(s): None  Interventional management options: Planned, scheduled, and/or pending:   Not at this time.   Considering:  Right cervical facet block Palliative right cervical epidural steroid injections Palliative left L4-5 lumbar epidural steroid injection   Palliative PRN treatment(s):  Palliative right cervical epidural steroid injections Palliative left L4-5 lumbar epidural steroid injection   Provider-requested follow-up: Return in about 3 months (around 05/01/2017) for MedMgmt.  Future Appointments Date Time Provider [place]   04/27/2017 12:45 PM Vevelyn Francois, NP [facility] None  11/06/2017 10:30 AM Eustace Pen, LPN [facility]  06/08/3543 11:30 AM Ria Bush,  MD [facility]   Primary Care Physician: Ria Bush, MD Location: [facility] Note by: Vevelyn Francois NP Date: 01/29/2017; Time: 11:45 AM  Pain Score Disclaimer: We use the NRS-11 scale. This is a self-reported, subjective measurement of pain severity with only modest accuracy. It is used primarily to identify changes within a particular patient. It must be understood that outpatient pain scales are significantly less accurate that those used for research, where they can be applied under ideal controlled circumstances with minimal exposure to variables. In reality, the score is likely to be a combination of pain intensity and pain affect, where pain affect describes the degree of emotional arousal or changes in action readiness caused by the sensory experience of pain. Factors such as social and work situation, setting, emotional state, anxiety levels, expectation, and prior pain experience may influence pain perception and show large inter-individual differences that may also be affected by time variables.  Patient instructions provided during this appointment: Patient Instructions   ____________________________________________________________________________________________  Medication Rules  Applies to: All patients receiving prescriptions (written or electronic).  Pharmacy of record: Pharmacy where electronic prescriptions will be sent. If written prescriptions are taken to a different pharmacy, please inform the nursing staff. The pharmacy listed in the electronic medical record should be the one where you would like electronic prescriptions to be sent.  Prescription refills: Only during scheduled appointments. Applies to both, written and electronic prescriptions.  NOTE: The following applies  primarily to controlled substances (Opioid* Pain Medications).   Patient's responsibilities: 1. Pain Pills: Bring all pain pills to every appointment (except for procedure appointments). 2. Pill Bottles: Bring pills in original pharmacy bottle. Always bring newest bottle. Bring bottle, even if empty. 3. Medication refills: You are responsible for knowing and keeping track of what medications you need refilled. The day before your appointment, write a list of all prescriptions that need to be refilled. Bring that list to your appointment and give it to the admitting nurse. Prescriptions will be written only during appointments. If you forget a medication, it will not be "Called in", "Faxed", or "electronically sent". You will need to get another appointment to get these prescribed. 4. Prescription Accuracy: You are responsible for carefully inspecting your prescriptions before leaving our office. Have the discharge nurse carefully go over each prescription with you, before taking them home. Make sure that your name is accurately spelled, that your address is correct. Check the name and dose of your medication to make sure it is accurate. Check the number of pills, and the written instructions to make sure they are clear and accurate. Make sure that you are given enough medication to last until your next medication refill appointment. 5. Taking Medication: Take medication as prescribed. Never take more pills than instructed. Never take medication more frequently than prescribed. Taking less pills or less frequently is permitted and encouraged, when it comes to controlled substances (written prescriptions).  6. Inform other Doctors: Always inform, all of your healthcare providers, of all the medications you take. 7. Pain Medication from other Providers: You are not allowed to accept any additional pain medication from any other Doctor or Healthcare provider. There are two exceptions to this rule. (see below)  In the event that you require additional pain medication, you are responsible for notifying us, as stated below. 8. Medication Agreement: You are responsible for carefully reading and following our Medication Agreement. This must be signed before receiving any prescriptions from our practice. Safely store  a copy of your signed Agreement. Violations to the Agreement will result in no further prescriptions. (Additional copies of our Medication Agreement are available upon request.) 9. Laws, Rules, & Regulations: All patients are expected to follow all Federal and [employer], [employer], Rules, [employer]. Ignorance of the Laws does not constitute a valid excuse. The use of any illegal substances is prohibited. 10. Adopted CDC guidelines & recommendations: Target dosing levels will be at or below 60 MME/day. Use of benzodiazepines** is not recommended.  Exceptions: There are only two exceptions to the rule of not receiving pain medications from other Healthcare Providers. 1. Exception #1 (Emergencies): In the event of an emergency (i.e.: accident requiring emergency care), you are allowed to receive additional pain medication. However, you are responsible for: As soon as you are able, call our office [phone], at any time of the day or night, and leave a message stating your name, the date and nature of the emergency, and the name and dose of the medication prescribed. In the event that your call is answered by a member of our staff, make sure to document and save the date, time, and the name of the person that took your information.  2. Exception #2 (Planned Surgery): In the event that you are scheduled by another doctor or dentist to have any type of surgery or procedure, you are allowed (for a period no longer than 30 days), to receive additional pain medication, for the acute post-op pain. However, in this case, you are responsible for picking up a copy of our "Post-op Pain Management for Surgeons"  handout, and giving it to your surgeon or dentist. This document is available at our office, and does not require an appointment to obtain it. Simply go to our office during business hours (Monday-Thursday from 8:00 AM to 4:00 PM) (Friday 8:00 AM to 12:00 Noon) or if you have a scheduled appointment with [place], prior to your surgery, and ask for it by name. In addition, you will need to provide [place] with your name, name of your surgeon, type of surgery, and date of procedure or surgery.  *Opioid medications include: morphine, codeine, oxycodone, oxymorphone, hydrocodone, hydromorphone, meperidine, tramadol, tapentadol, buprenorphine, fentanyl, methadone. **Benzodiazepine medications include: diazepam (Valium), alprazolam (Xanax), clonazepam (Klonopine), lorazepam (Ativan), clorazepate (Tranxene), chlordiazepoxide (Librium), estazolam (Prosom), oxazepam (Serax), temazepam (Restoril), triazolam (Halcion)  ____________________________________________________________________________________________

## 2017-02-05 LAB — TOXASSURE SELECT 13 (MW), URINE

## 2017-02-09 ENCOUNTER — Other Ambulatory Visit: Payer: Self-pay | Admitting: Family Medicine

## 2017-02-09 NOTE — Progress Notes (Signed)
  NO MED NOTE: This forensic urine drug screen (UDS) test was conducted using a state-of-the-art ultra high performance liquid chromatography and mass spectrometry system (UPLC/MS-MS), the most sophisticated and accurate method available. UPLC/MS-MS is 1,000 times more precise and accurate than standard gas chromatography and mass spectrometry (GC/MS). This system can analyze 26 drug categories and 180 drug compounds.  The findings of this UDT were reported as abnormal due to inconsistencies with expected results. An expected substance was not present in the sample. Expectations were based on the medication history provided by the patient at the time of sample collection. Results may suggest one of the following possibilities:  1). Possible opioid diversion. 2). Non-compliance with instructions on how to take the medication, including increase intake of medication early in the prescription refill, possibly running out towards the end of the prescribed period. 3). A scheduling issue where the patient was unable to come in before running out of medication. 4). PRN and/or low dose use of medication. ____________________________________________________________________________________________ UDS-LOW CREATININE Warning: Low urine creatinine.  (< 20 mg/dL) Less than 20 is considered to be a dilute urine specimen: Most likely due to increased water or liquid intake. This could be a result of short-term water loading (flushing) in an attempt to dilute any drug below testing cut-off concentrations.  (< 2 mg/dL) Less than 2 is considered an abnormally diluted sample: Specimen showing an excessively low creatinine value. May be an indication that the specimen is not consistent with normal human urine.

## 2017-02-16 ENCOUNTER — Ambulatory Visit: Payer: Medicare Other | Admitting: Family Medicine

## 2017-02-16 ENCOUNTER — Encounter: Payer: Self-pay | Admitting: Family Medicine

## 2017-02-16 ENCOUNTER — Ambulatory Visit (INDEPENDENT_AMBULATORY_CARE_PROVIDER_SITE_OTHER)
Admission: RE | Admit: 2017-02-16 | Discharge: 2017-02-16 | Disposition: A | Discharge status: Home / Self Care | Payer: Medicare Other | Source: Ambulatory Visit | Attending: Family Medicine | Admitting: Family Medicine

## 2017-02-16 VITALS — BP 112/60 | HR 97 | Temp 98.6°F | Wt 135.0 lb

## 2017-02-16 DIAGNOSIS — B9789 Other viral agents as the cause of diseases classified elsewhere: Secondary | ICD-10-CM

## 2017-02-16 DIAGNOSIS — R05 Cough: Secondary | ICD-10-CM

## 2017-02-16 DIAGNOSIS — J069 Acute upper respiratory infection, unspecified: Secondary | ICD-10-CM

## 2017-02-16 DIAGNOSIS — R059 Cough, unspecified: Secondary | ICD-10-CM

## 2017-02-16 NOTE — Patient Instructions (Signed)
Plain mucinex with plenty of fluids, rest, update me as needed.  Tylenol for fever, as needed.  Take care.  Glad to see you.

## 2017-02-16 NOTE — Progress Notes (Signed)
Sig other is going through chemo and radiation.   Cough and scratchy throat started about 3-4 days ago.  She started taking zinc but w/o relief.  More cough, green/yellow sputum.  This AM with fever up to 102.  Felt hot and then had chills.  No vomiting.  No diarrhea.  No rash.  No other sick contacts.  Taking 325 aspirin for the fever this AM.  Some occ wheeze.  Chest is sore.    Meds, vitals, and allergies reviewed.   ROS: Per HPI unless specifically indicated in ROS section   GEN: nad, alert and oriented HEENT: mucous membranes moist, tm w/o erythema, nasal exam w/o erythema, clear discharge noted,  OP with mild cobblestoning NECK: supple w/o LA CV: rrr.   PULM: ctab, no inc wob, no focal decrease in breath sounds. EXT: no edema SKIN: no acute rash  CXR independently reviewed.  Results discussed with patient.No active cardiopulmonary disease.

## 2017-02-18 NOTE — Assessment & Plan Note (Signed)
CXR independently reviewed.  Results discussed with patient.No active cardiopulmonary disease. ddx d/w pt.  Likely viral.  Plain mucinex with plenty of fluids, rest, update me as needed.  Tylenol for fever, as needed.  Would still isolate the patient from her significant other who is going through chemotherapy and radiation.  The patient can stay with her daughter tonight.  Routine cautions given.  >25 minutes spent in face to face time with patient, >50% spent in counselling or coordination of care.

## 2017-03-03 DIAGNOSIS — C44612 Basal cell carcinoma of skin of right upper limb, including shoulder: Secondary | ICD-10-CM | POA: Diagnosis not present

## 2017-03-03 DIAGNOSIS — L565 Disseminated superficial actinic porokeratosis (DSAP): Secondary | ICD-10-CM | POA: Diagnosis not present

## 2017-03-03 DIAGNOSIS — D225 Melanocytic nevi of trunk: Secondary | ICD-10-CM | POA: Diagnosis not present

## 2017-03-03 DIAGNOSIS — L821 Other seborrheic keratosis: Secondary | ICD-10-CM | POA: Diagnosis not present

## 2017-03-03 DIAGNOSIS — Z85828 Personal history of other malignant neoplasm of skin: Secondary | ICD-10-CM | POA: Diagnosis not present

## 2017-03-05 ENCOUNTER — Other Ambulatory Visit: Payer: Self-pay | Admitting: Family Medicine

## 2017-03-05 NOTE — Telephone Encounter (Signed)
Last filled:  08/06/15, #30 Last OV:  02/16/17 Next OV:  11/13/17

## 2017-03-06 NOTE — Telephone Encounter (Signed)
Rx sent in

## 2017-04-22 DIAGNOSIS — C44612 Basal cell carcinoma of skin of right upper limb, including shoulder: Secondary | ICD-10-CM | POA: Diagnosis not present

## 2017-04-22 DIAGNOSIS — L905 Scar conditions and fibrosis of skin: Secondary | ICD-10-CM | POA: Diagnosis not present

## 2017-04-27 ENCOUNTER — Ambulatory Visit: Payer: Medicare Other | Admitting: Nurse Practitioner

## 2017-04-28 ENCOUNTER — Ambulatory Visit: Payer: Medicare Other | Attending: Nurse Practitioner | Admitting: Nurse Practitioner

## 2017-04-28 ENCOUNTER — Encounter: Payer: Self-pay | Admitting: Nurse Practitioner

## 2017-04-28 ENCOUNTER — Other Ambulatory Visit: Payer: Self-pay

## 2017-04-28 VITALS — BP 142/63 | HR 62 | Temp 98.4°F | Resp 16 | Ht 60.0 in | Wt 135.0 lb

## 2017-04-28 DIAGNOSIS — Z87891 Personal history of nicotine dependence: Secondary | ICD-10-CM | POA: Diagnosis not present

## 2017-04-28 DIAGNOSIS — K589 Irritable bowel syndrome without diarrhea: Secondary | ICD-10-CM | POA: Insufficient documentation

## 2017-04-28 DIAGNOSIS — M5416 Radiculopathy, lumbar region: Secondary | ICD-10-CM | POA: Diagnosis not present

## 2017-04-28 DIAGNOSIS — Z8249 Family history of ischemic heart disease and other diseases of the circulatory system: Secondary | ICD-10-CM | POA: Insufficient documentation

## 2017-04-28 DIAGNOSIS — M792 Neuralgia and neuritis, unspecified: Secondary | ICD-10-CM | POA: Insufficient documentation

## 2017-04-28 DIAGNOSIS — I10 Essential (primary) hypertension: Secondary | ICD-10-CM | POA: Insufficient documentation

## 2017-04-28 DIAGNOSIS — M797 Fibromyalgia: Secondary | ICD-10-CM | POA: Diagnosis not present

## 2017-04-28 DIAGNOSIS — Z7982 Long term (current) use of aspirin: Secondary | ICD-10-CM | POA: Insufficient documentation

## 2017-04-28 DIAGNOSIS — Z880 Allergy status to penicillin: Secondary | ICD-10-CM | POA: Insufficient documentation

## 2017-04-28 DIAGNOSIS — E039 Hypothyroidism, unspecified: Secondary | ICD-10-CM | POA: Insufficient documentation

## 2017-04-28 DIAGNOSIS — Z8719 Personal history of other diseases of the digestive system: Secondary | ICD-10-CM | POA: Diagnosis not present

## 2017-04-28 DIAGNOSIS — F329 Major depressive disorder, single episode, unspecified: Secondary | ICD-10-CM | POA: Insufficient documentation

## 2017-04-28 DIAGNOSIS — M25551 Pain in right hip: Secondary | ICD-10-CM | POA: Diagnosis not present

## 2017-04-28 DIAGNOSIS — G894 Chronic pain syndrome: Secondary | ICD-10-CM | POA: Diagnosis not present

## 2017-04-28 DIAGNOSIS — Z885 Allergy status to narcotic agent status: Secondary | ICD-10-CM | POA: Insufficient documentation

## 2017-04-28 DIAGNOSIS — G8929 Other chronic pain: Secondary | ICD-10-CM | POA: Diagnosis not present

## 2017-04-28 DIAGNOSIS — Z933 Colostomy status: Secondary | ICD-10-CM | POA: Insufficient documentation

## 2017-04-28 DIAGNOSIS — M542 Cervicalgia: Secondary | ICD-10-CM

## 2017-04-28 DIAGNOSIS — Z86010 Personal history of colonic polyps: Secondary | ICD-10-CM | POA: Insufficient documentation

## 2017-04-28 DIAGNOSIS — E785 Hyperlipidemia, unspecified: Secondary | ICD-10-CM | POA: Diagnosis not present

## 2017-04-28 DIAGNOSIS — Z9889 Other specified postprocedural states: Secondary | ICD-10-CM | POA: Insufficient documentation

## 2017-04-28 DIAGNOSIS — K219 Gastro-esophageal reflux disease without esophagitis: Secondary | ICD-10-CM | POA: Insufficient documentation

## 2017-04-28 DIAGNOSIS — Z9842 Cataract extraction status, left eye: Secondary | ICD-10-CM | POA: Insufficient documentation

## 2017-04-28 DIAGNOSIS — Z79899 Other long term (current) drug therapy: Secondary | ICD-10-CM | POA: Insufficient documentation

## 2017-04-28 DIAGNOSIS — Z9841 Cataract extraction status, right eye: Secondary | ICD-10-CM | POA: Insufficient documentation

## 2017-04-28 DIAGNOSIS — G629 Polyneuropathy, unspecified: Secondary | ICD-10-CM | POA: Insufficient documentation

## 2017-04-28 DIAGNOSIS — Z981 Arthrodesis status: Secondary | ICD-10-CM | POA: Insufficient documentation

## 2017-04-28 DIAGNOSIS — Z90710 Acquired absence of both cervix and uterus: Secondary | ICD-10-CM | POA: Diagnosis not present

## 2017-04-28 DIAGNOSIS — Z823 Family history of stroke: Secondary | ICD-10-CM | POA: Insufficient documentation

## 2017-04-28 DIAGNOSIS — Z888 Allergy status to other drugs, medicaments and biological substances status: Secondary | ICD-10-CM | POA: Diagnosis not present

## 2017-04-28 MED ORDER — PREGABALIN 150 MG PO CAPS
150.0000 mg | ORAL_CAPSULE | Freq: Three times a day (TID) | ORAL | 5 refills | Status: DC
Start: 2017-04-28 — End: 2017-10-23

## 2017-04-28 NOTE — Progress Notes (Signed)
Nursing Pain Medication Assessment:  Safety precautions to be maintained throughout the outpatient stay will include: orient to surroundings, keep bed in low position, maintain call bell within reach at all times, provide assistance with transfer out of bed and ambulation.  Medication Inspection Compliance: Pill count conducted under aseptic conditions, in front of the patient. Neither the pills nor the bottle was removed from the patient's sight at any time. Once count was completed pills were immediately returned to the patient in their original bottle.  Medication: Hydrocodone/APAP Pill/Patch Count: 1.5 of 30 pills remain Pill/Patch Appearance: Markings consistent with prescribed medication Bottle Appearance: Standard pharmacy container. Clearly labeled. Filled Date: 28 / 17 / 2018 Last Medication intake:  Day before yesterday

## 2017-04-28 NOTE — Patient Instructions (Signed)

## 2017-04-28 NOTE — Progress Notes (Signed)
Patient's Name: Cheyenne Pierce  MRN: [id number]  Referring Provider: Ria Bush, MD  DOB: February 04, 1935  PCP: Ria Bush, MD  DOS: 04/28/2017  Note by: Vevelyn Francois NP  Service setting: Ambulatory outpatient  Specialty: Interventional Pain Management  Location: [facility] (AMB) Pain Management Facility    Patient type: Established    Primary Reason(s) for Visit: Encounter for prescription drug management. (Level of risk: moderate)  CC: Back Pain (lower) and Leg Pain (leg)  HPI  Cheyenne Pierce is a 83 y.o. year old, female patient, who comes today for a medication management evaluation. She has Hypothyroidism; GAD (generalized anxiety disorder); Hereditary and idiopathic peripheral neuropathy; Essential hypertension; Reflux esophagitis; GERD; INGUINAL HERNIA, LEFT; Irritable bowel syndrome; Other chronic cystitis; Osteopenia; Systolic murmur; Medicare annual wellness visit, subsequent; PVCs (premature ventricular contractions); Prediabetes; Memory deficit; Advanced care planning/counseling discussion; HLD (hyperlipidemia); Health maintenance examination; Recurrent falls; Night sweats; Chronic pain syndrome; Encounter for chronic pain management; Chronic neck pain (Location of Primary Source of Pain) (Bilateral) (R>L); Upper extremity weakness (Bilateral); Inflammatory pain; Encounter for long-term current use of medication; Chronic Lumbar Radiculopathy (Left); Chronic hip pain (Location of Tertiary source of pain) (Right); Lumbar spinal stenosis (L3-4); MDD (major depressive disorder), recurrent episode, moderate ([place]); Fibromyalgia; Neck pain on right side; Primary osteoarthritis involving multiple joints; Opiate use; Viral URI with cough; and Dysuria on their problem list. Her primarily concern today is the Back Pain (lower) and Leg Pain (leg)  Pain Assessment: Location: Lower, Medial Back Radiating: travels to both hips: down left leg mostly, sometimes down right leg, to all toes Onset: More  than a month ago Duration: Chronic pain, Neuropathic pain Quality: Constant, Aching, Heaviness, Sharp, Shooting, Burning Severity: 4 /10 (self-reported pain score)  Note: Reported level is compatible with observation.                          Effect on ADL: medications, procedures Timing: Constant Modifying factors:    Cheyenne Pierce was last scheduled for an appointment on 01/29/2017 for medication management. During today's appointment we reviewed Cheyenne Pierce's chronic pain status, as well as her outpatient medication regimen. She admits that she is no longer interested in taking the Norco. She feels like APAP and Lyrica will be effective for her pain management along with prn procedures.   The patient  reports that she does not use drugs. Her body mass index is 26.37 kg/m.  Further details on both, my assessment(s), as well as the proposed treatment plan, please see below.  Controlled Substance Pharmacotherapy Assessment REMS (Risk Evaluation and Mitigation Strategy)  Analgesic: Norco 5/362m 0.5 tablets daily MME/day: 15 mg/day.    WRise Patience 04/28/2017 12:28 PM  Signed Nursing Pain Medication Assessment:  Safety precautions to be maintained throughout the outpatient stay will include: orient to surroundings, keep bed in low position, maintain call bell within reach at all times, provide assistance with transfer out of bed and ambulation.  Medication Inspection Compliance: Pill count conducted under aseptic conditions, in front of the patient. Neither the pills nor the bottle was removed from the patient's sight at any time. Once count was completed pills were immediately returned to the patient in their original bottle.  Medication: Hydrocodone/APAP Pill/Patch Count: 1.5 of 30 pills remain Pill/Patch Appearance: Markings consistent with prescribed medication Bottle Appearance: Standard pharmacy container. Clearly labeled. Filled Date: 171/ 17 / 2018 Last Medication intake:  Day  before yesterday   Pharmacokinetics: Liberation and absorption (  onset of action): WNL Distribution (time to peak effect): WNL Metabolism and excretion (duration of action): WNL         Pharmacodynamics: Desired effects: Analgesia: Cheyenne Pierce reports >50% benefit. Functional ability: Patient reports that medication allows her to accomplish basic ADLs Clinically meaningful improvement in function (CMIF): Sustained CMIF goals met Perceived effectiveness: Described as relatively effective, allowing for increase in activities of daily living (ADL) Undesirable effects: Side-effects or Adverse reactions: None reported Monitoring: [place] PMP: Online review of the past 83-monthperiod conducted. Compliant with practice rules and regulations Last UDS on record: Summary  Date Value Ref Range Status  01/29/2017 FINAL  Final    Comment:    ==================================================================== TOXASSURE SELECT 13 (MW) ==================================================================== Specimen Alert Note:  Urinary creatinine is low; ability to detect some drugs may be compromised.  Interpret results with caution. ==================================================================== Test                             Result       Flag       Units Drug Present and Declared for Prescription Verification   Norhydrocodone                 289          EXPECTED   ng/mg creat    Norhydrocodone is an expected metabolite of hydrocodone. Drug Absent but Declared for Prescription Verification   Hydrocodone                    Not Detected UNEXPECTED ng/mg creat    Hydrocodone is almost always present in patients taking this drug    consistently. Absence of hydrocodone could be due to lapse of    time since the last dose or unusual pharmacokinetics (rapid    metabolism). ==================================================================== Test                      Result    Flag   Units      Ref  Range   Creatinine              19        L      mg/dL      >=20 ==================================================================== Declared Medications:  The flagging and interpretation on this report are based on the  following declared medications.  Unexpected results may arise from  inaccuracies in the declared medications.  **Note: The testing scope of this panel includes these medications:  Hydrocodone (Norco)  **Note: The testing scope of this panel does not include following  reported medications:  Acetaminophen (Norco)  Acetaminophen (Tylenol)  Amlodipine (Norvasc)  Aspirin (Aspirin 81)  Diphenhydramine (Benadryl)  Hydrochlorothiazide (Maxzide)  Hyoscyamine  Levothyroxine  Loperamide  Magnesium  Meclizine  Nitroglycerin  Pantoprazole (Protonix)  Pregabalin (Lyrica)  Supplement (OsCal)  Triamterene (Maxzide)  Vitamin B (Biotin)  Vitamin D ==================================================================== For clinical consultation, please call ([phone] ====================================================================    UDS interpretation: Compliant          Medication Assessment Form: Reviewed. Patient indicates being compliant with therapy Treatment compliance: Compliant Risk Assessment Profile: Aberrant behavior: See prior evaluations. None observed or detected today Comorbid factors increasing risk of overdose: See prior notes. No additional risks detected today Risk of substance use disorder (SUD): Low Opioid Risk Tool - 04/28/17 1223      Family History of Substance Abuse   Alcohol  Negative    Illegal  Drugs  Negative    Rx Drugs  Negative      Personal History of Substance Abuse   Alcohol  Negative    Illegal Drugs  Negative    Rx Drugs  Negative      Age   Age between 67-45 years   No      Psychological Disease   Psychological Disease  Negative    Depression  Positive      Total Score   Opioid Risk Tool Scoring  1    Opioid  Risk Interpretation  Low Risk      ORT Scoring interpretation table:  Score <3 = Low Risk for SUD  Score between 4-7 = Moderate Risk for SUD  Score >8 = High Risk for Opioid Abuse   Risk Mitigation Strategies:  Patient Counseling: Covered Patient-Prescriber Agreement (PPA): Present and active  Notification to other healthcare providers: Done  Pharmacologic Plan: No change in therapy, at this time.             Laboratory Chemistry  Inflammation Markers (CRP: Acute Phase) (ESR: Chronic Phase) Lab Results  Component Value Date   CRP 0.7 09/12/2014   ESRSEDRATE 11 01/07/2016                 Rheumatology Markers No results found for: RF, ANA, Therisa Doyne, [facility]              Renal Function Markers Lab Results  Component Value Date   BUN 11 10/28/2016   CREATININE 0.71 10/28/2016   GFRAA >60 05/22/2015   GFRNONAA >60 05/22/2015                 Hepatic Function Markers Lab Results  Component Value Date   AST 100 (H) 01/07/2017   ALT 17 01/07/2017   ALBUMIN 4.3 01/07/2017   ALKPHOS 45 01/07/2017                 Electrolytes Lab Results  Component Value Date   NA 136 10/28/2016   K 3.8 10/28/2016   CL 99 10/28/2016   CALCIUM 9.9 10/28/2016   PHOS 3.5 01/22/2010                 Neuropathy Markers Lab Results  Component Value Date   VITAMINB12 >1500 (H) 09/22/2012   HGBA1C 6.0 10/28/2016                 Bone Pathology Markers Lab Results  Component Value Date   VD25OH 47.39 10/04/2013                 Coagulation Parameters Lab Results  Component Value Date   INR 1.0 11/08/2012   LABPROT 13.8 11/08/2012   APTT 32.6 11/08/2012   PLT 337.0 01/07/2016                 Cardiovascular Markers Lab Results  Component Value Date   CKTOTAL 123 10/28/2011   CKMB 2.3 10/28/2011   TROPONINI < 0.02 11/08/2012   HGB 13.8 01/07/2016   HCT 40.9 01/07/2016                 CA Markers No results found for: CEA, CA125, LABCA2                Note: Lab results reviewed.  Recent Diagnostic Imaging Results  DG Chest 2 View CLINICAL DATA:  Cough  EXAM: CHEST  2 VIEW  COMPARISON:  04/12/2015  FINDINGS: Cardiac silhouette is  normal in size. No mediastinal or hilar masses. No convincing adenopathy.  Clear lungs.  No pleural effusion or pneumothorax.  Skeletal structures are demineralized. There changes from an anterior cervical spine fusion, new since the prior study.  IMPRESSION: No active cardiopulmonary disease.  Electronically Signed   By: Lajean Manes M.D.   On: 02/16/2017 15:15  Complexity Note: Imaging results reviewed. Results shared with Cheyenne Pierce, using Layman's terms.                         Meds   Current Outpatient Medications:  .  acetaminophen (TYLENOL) 500 MG tablet, Take 500 mg by mouth every 6 (six) hours as needed., Disp: , Rfl:  .  amLODipine (NORVASC) 2.5 MG tablet, Take 1 tablet (2.5 mg total) by mouth daily., Disp: 90 tablet, Rfl: 3 .  aspirin EC 81 MG tablet, Take 81 mg by mouth as needed., Disp: , Rfl:  .  ASTRAGALUS PO, Take 500 mg by mouth daily., Disp: , Rfl:  .  benzonatate (TESSALON) 100 MG capsule, take 1 capsule by mouth twice a day if needed for cough, Disp: 30 capsule, Rfl: 0 .  Biotin 1000 MCG tablet, Take 1,000 mcg by mouth daily., Disp: , Rfl:  .  calcium-vitamin D (OSCAL WITH D) 500-200 MG-UNIT tablet, Take 1 tablet by mouth., Disp: , Rfl:  .  diphenhydrAMINE (BENADRYL) 25 mg capsule, Take 25 mg by mouth at bedtime. , Disp: , Rfl:  .  hyoscyamine (LEVSIN, ANASPAZ) 0.125 MG tablet, Take 1 tablet (0.125 mg total) by mouth 2 (two) times daily as needed., Disp: 30 tablet, Rfl: 3 .  levothyroxine (LEVOXYL) 75 MCG tablet, Take 1 tablet (75 mcg total) by mouth daily., Disp: 30 tablet, Rfl: 6 .  loperamide (IMODIUM) 2 MG capsule, Take by mouth as needed for diarrhea or loose stools., Disp: , Rfl:  .  Magnesium 250 MG TABS, Take by mouth daily., Disp: , Rfl:  .  meclizine  (ANTIVERT) 25 MG tablet, take 1 tablet by mouth three times a day if needed, Disp: 30 tablet, Rfl: 1 .  nitroGLYCERIN (NITROSTAT) 0.4 MG SL tablet, Place 0.4 mg under the tongue every 5 (five) minutes as needed., Disp: , Rfl:  .  pantoprazole (PROTONIX) 40 MG tablet, Take 1 tablet (40 mg total) by mouth daily., Disp: 30 tablet, Rfl: 11 .  pregabalin (LYRICA) 150 MG capsule, Take 1 capsule (150 mg total) by mouth 3 (three) times daily., Disp: 90 capsule, Rfl: 5 .  triamterene-hydrochlorothiazide (MAXZIDE-25) 37.5-25 MG tablet, take 1 tablet by mouth once daily, Disp: 30 tablet, Rfl: 4 .  HYDROcodone-acetaminophen (NORCO/VICODIN) 5-325 MG tablet, Take 1 tablet by mouth daily., Disp: 30 tablet, Rfl: 0 .  HYDROcodone-acetaminophen (NORCO/VICODIN) 5-325 MG tablet, Take 1 tablet by mouth daily., Disp: 30 tablet, Rfl: 0  ROS  Constitutional: Denies any fever or chills Gastrointestinal: No reported hemesis, hematochezia, vomiting, or acute GI distress Musculoskeletal: Denies any acute onset joint swelling, redness, loss of ROM, or weakness Neurological: No reported episodes of acute onset apraxia, aphasia, dysarthria, agnosia, amnesia, paralysis, loss of coordination, or loss of consciousness  Allergies  Cheyenne Pierce is allergic to codeine; penicillins; prednisone; zoloft [sertraline hcl]; amoxicillin; codeine phosphate; lactose intolerance (gi); metronidazole; perindopril erbumine; potassium-containing compounds; prozac [fluoxetine hcl]; tramadol hcl; and oxcarbazepine.  PFSH  Drug: Cheyenne Pierce  reports that she does not use drugs. Alcohol:  reports that she does not drink alcohol. Tobacco:  reports that she  has quit smoking. She has a 30.00 pack-year smoking history. she has never used smokeless tobacco. Medical:  has a past medical history of Allergy, Angina pectoris ([place]), Anxiety, Cancer ([place]), Cataract, Chronic back pain, DDD (degenerative disc disease), cervical, DDD (degenerative disc disease),  cervical, DDD (degenerative disc disease), lumbar, Dental bridge present, Depression, Diverticulosis, GERD (gastroesophageal reflux disease), Headache, Heart murmur, History of bronchitis, History of colon polyps, History of gastric ulcer, History of hiatal hernia, Hypertension, Hypothyroidism, IBS (irritable bowel syndrome), Insomnia, Joint pain, Joint swelling, Mass of vagina, Nocturia, Peripheral neuropathy, Peripheral neuropathy, Urinary frequency, Urinary urgency, and Vertigo. Surgical: Cheyenne Pierce  has a past surgical history that includes Appendectomy (1948); Abdominal hysterectomy (1968); Ovarian cyst surgery (1963); Hernia repair; Colostomy w/ rectocele repair (06/2004); Ventral hernia repair (08/2008); Colonoscopy (07/2009); epidural steroid injections (multiple latest 05/2014, 07/2014); US ECHOCARDIOGRAPHY (2009); NM MYOVIEW LTD (2009); DEXA (2013); Colonoscopy (06/2014); Tonsillectomy; Cardiac catheterization (09/26/1998); bladder suspension with vaginal sling; epidural infections; Esophagogastroduodenoscopy; Anterior cervical decompression/discectomy fusion 4 level (N/A, 06/05/2015); Cataract extraction w/PHACO (Left, 10/15/2015); Eye surgery (Left, 10/15/2015); and Cataract extraction w/PHACO (Right, 11/26/2015). Family: family history includes Heart disease in her mother; Stroke in her brother.  Constitutional Exam  General appearance: Well nourished, well developed, and well hydrated. In no apparent acute distress Vitals:   04/28/17 1203  BP: (!) 142/63  Pulse: 62  Resp: 16  Temp: 98.4 F (36.9 C)  TempSrc: Oral  SpO2: 99%  Weight: 135 lb (61.2 kg)  Height: 5' (1.524 m)   BMI Assessment: Estimated body mass index is 26.37 kg/m as calculated from the following:   Height as of this encounter: 5' (1.524 m).   Weight as of this encounter: 135 lb (61.2 kg). Psych/Mental status: Alert, oriented x 3 (person, place, & time)       Eyes: PERLA Respiratory: No evidence of acute respiratory  distress  Cervical Spine Area Exam  Skin & Axial Inspection: No masses, redness, edema, swelling, or associated skin lesions Alignment: Symmetrical Functional ROM: Unrestricted ROM      Stability: No instability detected Muscle Tone/Strength: Functionally intact. No obvious neuro-muscular anomalies detected. Sensory (Neurological): Unimpaired Palpation: No palpable anomalies              Upper Extremity (UE) Exam    Side: Right upper extremity  Side: Left upper extremity  Skin & Extremity Inspection: Skin color, temperature, and hair growth are WNL. No peripheral edema or cyanosis. No masses, redness, swelling, asymmetry, or associated skin lesions. No contractures.  Skin & Extremity Inspection: Skin color, temperature, and hair growth are WNL. No peripheral edema or cyanosis. No masses, redness, swelling, asymmetry, or associated skin lesions. No contractures.  Functional ROM: Unrestricted ROM          Functional ROM: Unrestricted ROM          Muscle Tone/Strength: Functionally intact. No obvious neuro-muscular anomalies detected.  Muscle Tone/Strength: Functionally intact. No obvious neuro-muscular anomalies detected.  Sensory (Neurological): Unimpaired          Sensory (Neurological): Unimpaired          Palpation: No palpable anomalies              Palpation: No palpable anomalies              Specialized Test(s): Deferred         Specialized Test(s): Deferred          Thoracic Spine Area Exam  Skin & Axial Inspection: No masses, redness, or swelling  Alignment: Symmetrical Functional ROM: Unrestricted ROM Stability: No instability detected Muscle Tone/Strength: Functionally intact. No obvious neuro-muscular anomalies detected. Sensory (Neurological): Unimpaired Muscle strength & Tone: No palpable anomalies  Lumbar Spine Area Exam  Skin & Axial Inspection: No masses, redness, or swelling Alignment: Symmetrical Functional ROM: Unrestricted ROM      Stability: No instability  detected Muscle Tone/Strength: Functionally intact. No obvious neuro-muscular anomalies detected. Sensory (Neurological): Unimpaired Palpation: No palpable anomalies       Provocative Tests: Lumbar Hyperextension and rotation test: evaluation deferred today       Lumbar Lateral bending test: evaluation deferred today       Patrick's Maneuver: evaluation deferred today                    Gait & Posture Assessment  Ambulation: Patient ambulates using a walker Gait: Relatively normal for age and body habitus Posture: WNL   Lower Extremity Exam    Side: Right lower extremity  Side: Left lower extremity  Skin & Extremity Inspection: Skin color, temperature, and hair growth are WNL. No peripheral edema or cyanosis. No masses, redness, swelling, asymmetry, or associated skin lesions. No contractures.  Skin & Extremity Inspection: Skin color, temperature, and hair growth are WNL. No peripheral edema or cyanosis. No masses, redness, swelling, asymmetry, or associated skin lesions. No contractures.  Functional ROM: Unrestricted ROM          Functional ROM: Unrestricted ROM          Muscle Tone/Strength: Functionally intact. No obvious neuro-muscular anomalies detected.  Muscle Tone/Strength: Functionally intact. No obvious neuro-muscular anomalies detected.  Sensory (Neurological): Unimpaired  Sensory (Neurological): Unimpaired  Palpation: No palpable anomalies  Palpation: No palpable anomalies   Assessment  Primary Diagnosis & Pertinent Problem List: The primary encounter diagnosis was Chronic neck pain (Location of Primary Source of Pain) (Bilateral) (R>L). Diagnoses of Chronic Lumbar Radiculopathy (Left), Chronic hip pain (Location of Tertiary source of pain) (Right), Chronic pain syndrome, Neurogenic pain, and Fibromyalgia were also pertinent to this visit.  Status Diagnosis  Controlled Controlled Controlled 1. Chronic neck pain (Location of Primary Source of Pain) (Bilateral) (R>L)   2.  Chronic Lumbar Radiculopathy (Left)   3. Chronic hip pain (Location of Tertiary source of pain) (Right)   4. Chronic pain syndrome   5. Neurogenic pain   6. Fibromyalgia     Problems updated and reviewed during this visit: No problems updated. Plan of Care  Pharmacotherapy (Medications Ordered): Meds ordered this encounter  Medications  . pregabalin (LYRICA) 150 MG capsule    Sig: Take 1 capsule (150 mg total) by mouth 3 (three) times daily.    Dispense:  90 capsule    Refill:  5    Do not place this medication, or any other prescription from our practice, on "Automatic Refill". Patient may have prescription filled one day early if pharmacy is closed on scheduled refill date.    Order Specific Question:   Supervising Provider    Answer:   Milinda Pointer [[id number]]   New Prescriptions   No medications on file   Medications administered today: Cheyenne Pierce had no medications administered during this visit. Lab-work, procedure(s), and/or referral(s): No orders of the defined types were placed in this encounter.  Imaging and/or referral(s): None  Interventional management options: Planned, scheduled, and/or pending:  Not at this time. PCP to write for Lyrica   Considering:  Right cervical facet block Palliative right cervical epidural steroid injections Palliative left  L4-5 lumbar epidural steroid injection   Palliative PRN treatment(s):  Palliative right cervical epidural steroid injections Palliative left L4-5 lumbar epidural steroid injection   Provider-requested follow-up: Return in about 6 months (around 10/26/2017) for MedMgmt with Me Donella Stade Edison Pace).  Future Appointments  Date Time Provider [place]  11/06/2017 10:30 AM Eustace Pen, LPN [facility]  05/12/9561 11:30 AM Ria Bush, MD [facility]   Primary Care Physician: Ria Bush, MD Location: [facility] Facility Note by: Vevelyn Francois NP Date:  04/28/2017; Time: 2:04 PM  Pain Score Disclaimer: We use the NRS-11 scale. This is a self-reported, subjective measurement of pain severity with only modest accuracy. It is used primarily to identify changes within a particular patient. It must be understood that outpatient pain scales are significantly less accurate that those used for research, where they can be applied under ideal controlled circumstances with minimal exposure to variables. In reality, the score is likely to be a combination of pain intensity and pain affect, where pain affect describes the degree of emotional arousal or changes in action readiness caused by the sensory experience of pain. Factors such as social and work situation, setting, emotional state, anxiety levels, expectation, and prior pain experience may influence pain perception and show large inter-individual differences that may also be affected by time variables.  Patient instructions provided during this appointment: Patient Instructions   ____________________________________________________________________________________________  Medication Rules  Applies to: All patients receiving prescriptions (written or electronic).  Pharmacy of record: Pharmacy where electronic prescriptions will be sent. If written prescriptions are taken to a different pharmacy, please inform the nursing staff. The pharmacy listed in the electronic medical record should be the one where you would like electronic prescriptions to be sent.  Prescription refills: Only during scheduled appointments. Applies to both, written and electronic prescriptions.  NOTE: The following applies primarily to controlled substances (Opioid* Pain Medications).   Patient's responsibilities: 1. Pain Pills: Bring all pain pills to every appointment (except for procedure appointments). 2. Pill Bottles: Bring pills in original pharmacy bottle. Always bring newest bottle. Bring bottle, even if  empty. 3. Medication refills: You are responsible for knowing and keeping track of what medications you need refilled. The day before your appointment, write a list of all prescriptions that need to be refilled. Bring that list to your appointment and give it to the admitting nurse. Prescriptions will be written only during appointments. If you forget a medication, it will not be "Called in", "Faxed", or "electronically sent". You will need to get another appointment to get these prescribed. 4. Prescription Accuracy: You are responsible for carefully inspecting your prescriptions before leaving our office. Have the discharge nurse carefully go over each prescription with you, before taking them home. Make sure that your name is accurately spelled, that your address is correct. Check the name and dose of your medication to make sure it is accurate. Check the number of pills, and the written instructions to make sure they are clear and accurate. Make sure that you are given enough medication to last until your next medication refill appointment. 5. Taking Medication: Take medication as prescribed. Never take more pills than instructed. Never take medication more frequently than prescribed. Taking less pills or less frequently is permitted and encouraged, when it comes to controlled substances (written prescriptions).  6. Inform other Doctors: Always inform, all of your healthcare providers, of all the medications you take. 7. Pain Medication from other Providers: You are not allowed to  accept any additional pain medication from any other Doctor or Healthcare provider. There are two exceptions to this rule. (see below) In the event that you require additional pain medication, you are responsible for notifying us, as stated below. 8. Medication Agreement: You are responsible for carefully reading and following our Medication Agreement. This must be signed before receiving any prescriptions from our practice.  Safely store a copy of your signed Agreement. Violations to the Agreement will result in no further prescriptions. (Additional copies of our Medication Agreement are available upon request.) 9. Laws, Rules, & Regulations: All patients are expected to follow all Federal and [employer], [employer], Rules, [employer]. Ignorance of the Laws does not constitute a valid excuse. The use of any illegal substances is prohibited. 10. Adopted CDC guidelines & recommendations: Target dosing levels will be at or below 60 MME/day. Use of benzodiazepines** is not recommended.  Exceptions: There are only two exceptions to the rule of not receiving pain medications from other Healthcare Providers. 1. Exception #1 (Emergencies): In the event of an emergency (i.e.: accident requiring emergency care), you are allowed to receive additional pain medication. However, you are responsible for: As soon as you are able, call our office [phone], at any time of the day or night, and leave a message stating your name, the date and nature of the emergency, and the name and dose of the medication prescribed. In the event that your call is answered by a member of our staff, make sure to document and save the date, time, and the name of the person that took your information.  2. Exception #2 (Planned Surgery): In the event that you are scheduled by another doctor or dentist to have any type of surgery or procedure, you are allowed (for a period no longer than 30 days), to receive additional pain medication, for the acute post-op pain. However, in this case, you are responsible for picking up a copy of our "Post-op Pain Management for Surgeons" handout, and giving it to your surgeon or dentist. This document is available at our office, and does not require an appointment to obtain it. Simply go to our office during business hours (Monday-Thursday from 8:00 AM to 4:00 PM) (Friday 8:00 AM to 12:00 Noon) or if you have a scheduled  appointment with [place], prior to your surgery, and ask for it by name. In addition, you will need to provide [place] with your name, name of your surgeon, type of surgery, and date of procedure or surgery.  *Opioid medications include: morphine, codeine, oxycodone, oxymorphone, hydrocodone, hydromorphone, meperidine, tramadol, tapentadol, buprenorphine, fentanyl, methadone. **Benzodiazepine medications include: diazepam (Valium), alprazolam (Xanax), clonazepam (Klonopine), lorazepam (Ativan), clorazepate (Tranxene), chlordiazepoxide (Librium), estazolam (Prosom), oxazepam (Serax), temazepam (Restoril), triazolam (Halcion)  ____________________________________________________________________________________________

## 2017-05-26 DIAGNOSIS — H16223 Keratoconjunctivitis sicca, not specified as Sjogren's, bilateral: Secondary | ICD-10-CM | POA: Diagnosis not present

## 2017-06-02 ENCOUNTER — Other Ambulatory Visit: Payer: Self-pay | Admitting: Family Medicine

## 2017-06-02 DIAGNOSIS — H16223 Keratoconjunctivitis sicca, not specified as Sjogren's, bilateral: Secondary | ICD-10-CM | POA: Diagnosis not present

## 2017-06-09 ENCOUNTER — Telehealth: Payer: Self-pay

## 2017-06-09 MED ORDER — AMLODIPINE BESYLATE 2.5 MG PO TABS: 2.5000 mg | ORAL_TABLET | Freq: Every day | ORAL | 1 refills | Status: DC

## 2017-06-09 NOTE — Telephone Encounter (Signed)
Thanks

## 2017-06-09 NOTE — Telephone Encounter (Signed)
I spoke with pt; when pt seen 11/06/16 pt was to f/u in 4 - 6 months. I refilled amlodipine per protocol and pt said she was going to call for appt. on 06/07/17 pt had sharp pain in chest but after taking ASA CP subsided. No SOB, dizziness, pain into neck or arm for any period of time and no pain now. Pt did not want to go to ED or schedule appt this week due to other appts already scheduled this week. Pt scheduled 30' appt with Dr Darnell Level on 06/15/17 at 12 noon and if pt condition worsens prior to appt if pt thinks necessary will call office back or go to ED. FYI to Dr Darnell Level.

## 2017-06-09 NOTE — Telephone Encounter (Signed)
Copied from [place] [id number]. Topic: Quick Communication - See Telephone Encounter >> Jun 09, 2017  3:55 PM Oneta Rack wrote: Relation to pt: self  Call back [phone] Pharmacy: [facility] [place], [place] [phone] (Phone) [phone] (Fax)    Reason for call:  Patient requesting amLODipine (NORVASC) 2.5 MG tablet, informed patient please allow 48 to 72 hour turn around time, please advise around >> Jun 09, 2017  4:00 PM Claudia Desanctis M wrote: Relation to pt: self  Call back [phone] Pharmacy: [facility] [place], [place] [phone] (Phone) [phone] (Fax)    Reason for call:  Patient requesting amLODipine (NORVASC) 2.5 MG tablet, informed patient please allow 48 to 72 hour turn around time, please advise around

## 2017-06-10 ENCOUNTER — Telehealth: Payer: Self-pay | Admitting: Family Medicine

## 2017-06-10 NOTE — Telephone Encounter (Signed)
Dr Darnell Level I was looking at your schedule for next week  And noticed Cheyenne Pierce ([id number]) has an appointment with you on 3/11 for chest pain on 3/3  Is it ok for her to wait for this appointment?

## 2017-06-10 NOTE — Telephone Encounter (Addendum)
Pt was offered sooner appointments but did not want to schedule.  Thanks for checking.

## 2017-06-11 NOTE — Telephone Encounter (Signed)
Noted  

## 2017-06-15 ENCOUNTER — Ambulatory Visit: Payer: Medicare Other | Admitting: Family Medicine

## 2017-06-15 ENCOUNTER — Encounter: Payer: Self-pay | Admitting: Family Medicine

## 2017-06-15 VITALS — BP 120/82 | HR 71 | Temp 97.9°F | Wt 138.0 lb

## 2017-06-15 DIAGNOSIS — R079 Chest pain, unspecified: Secondary | ICD-10-CM

## 2017-06-15 DIAGNOSIS — I10 Essential (primary) hypertension: Secondary | ICD-10-CM

## 2017-06-15 DIAGNOSIS — I493 Ventricular premature depolarization: Secondary | ICD-10-CM

## 2017-06-15 DIAGNOSIS — M797 Fibromyalgia: Secondary | ICD-10-CM | POA: Diagnosis not present

## 2017-06-15 DIAGNOSIS — E785 Hyperlipidemia, unspecified: Secondary | ICD-10-CM

## 2017-06-15 DIAGNOSIS — E039 Hypothyroidism, unspecified: Secondary | ICD-10-CM | POA: Diagnosis not present

## 2017-06-15 LAB — CBC WITH DIFFERENTIAL/PLATELET
BASOS PCT: 1.2 % (ref 0.0–3.0)
Basophils Absolute: 0.1 10*3/uL (ref 0.0–0.1)
EOS ABS: 0.1 10*3/uL (ref 0.0–0.7)
Eosinophils Relative: 2.6 % (ref 0.0–5.0)
HCT: 40.8 % (ref 36.0–46.0)
HEMOGLOBIN: 13.8 g/dL (ref 12.0–15.0)
Lymphocytes Relative: 37 % (ref 12.0–46.0)
Lymphs Abs: 1.7 10*3/uL (ref 0.7–4.0)
MCHC: 33.9 g/dL (ref 30.0–36.0)
MCV: 89 fl (ref 78.0–100.0)
MONO ABS: 0.3 10*3/uL (ref 0.1–1.0)
Monocytes Relative: 7.3 % (ref 3.0–12.0)
NEUTROS ABS: 2.3 10*3/uL (ref 1.4–7.7)
Neutrophils Relative %: 51.9 % (ref 43.0–77.0)
PLATELETS: 275 10*3/uL (ref 150.0–400.0)
RBC: 4.59 Mil/uL (ref 3.87–5.11)
RDW: 14.9 % (ref 11.5–15.5)
WBC: 4.5 10*3/uL (ref 4.0–10.5)

## 2017-06-15 LAB — COMPREHENSIVE METABOLIC PANEL
ALT: 17 U/L (ref 0–35)
AST: 118 U/L — ABNORMAL HIGH (ref 0–37)
Albumin: 4.4 g/dL (ref 3.5–5.2)
Alkaline Phosphatase: 43 U/L (ref 39–117)
BUN: 12 mg/dL (ref 6–23)
CALCIUM: 10 mg/dL (ref 8.4–10.5)
CO2: 33 meq/L — AB (ref 19–32)
Chloride: 99 mEq/L (ref 96–112)
Creatinine, Ser: 0.68 mg/dL (ref 0.40–1.20)
GFR: 88.28 mL/min (ref >60.00)
Glucose, Bld: 90 mg/dL (ref 70–99)
Potassium: 3.5 mEq/L (ref 3.5–5.1)
Sodium: 137 mEq/L (ref 135–145)
Total Bilirubin: 0.4 mg/dL (ref 0.2–1.2)
Total Protein: 7.4 g/dL (ref 6.0–8.3)

## 2017-06-15 LAB — LDL CHOLESTEROL, DIRECT: LDL DIRECT: 105 mg/dL

## 2017-06-15 NOTE — Assessment & Plan Note (Addendum)
She is on high dose lyrica 150mg  TID, previously managed by [facility]. Pt now off narcotics, requests I take over lyrica prescription. As ok by pain management, discussed I would be willing to continue prescribing. Kidney function seems to be tolerating this regimen. Will take over Rx when next refill due.

## 2017-06-15 NOTE — Assessment & Plan Note (Addendum)
Update EKG. Has not tolerated beta blockers well in the past.

## 2017-06-15 NOTE — Assessment & Plan Note (Signed)
Chronic, stable. Continue amlodipine 2.5mg , maxzide.

## 2017-06-15 NOTE — Progress Notes (Signed)
BP 120/82 (BP Location: Left Arm, Patient Position: Sitting, Cuff Size: Normal)   Pulse 71   Temp 97.9 F (36.6 C) (Oral)   Wt 138 lb (62.6 kg)   SpO2 96%   BMI 26.95 kg/m    CC: chest pain Subjective:    Patient ID: Cheyenne Pierce, female    DOB: 05-16-1934, 83 y.o.   MRN: [id number]  HPI: Cheyenne Pierce is a 83 y.o. female presenting on 06/15/2017 for Medication Refill (Wants Dr. Darnell Pierce to take over Lyrica refills. Pt is good with refills for right now.) and Chest Pain (Had chest pain on 06/07/17. H/o chest pain.  Pt accompanied by partner, Mr. Cheyenne Pierce.)   Isolated episode of chest discomfort associated with irregular heart beat (according to her monitor) on 06/07/2017. This came on at rest - while fixing breakfast. Not exertion related, nor relieved with rest. Describes sharp stabbing pain L chest that traveled through to back. Had ongoing sharp twinges throughout the rest of the day. She did have palpitations "heart felt like it was racing". This discomfort was not activity limiting. Not related to anxiety, stress. It resolved and she hasn't had episode since then. She was not evaluated for this. She self treated this with aspirin 325mg  x3 that day. Denies dyspnea, diaphoresis, dizziness, fever or cough, or GERD related symptoms that day.   Brings BP log: 119-122/70-80s, HR 70s  Requests thyroid checked due to ongoing fatigue.   She has previously had cardiac workup including cardiac catheterization with nonobstructive CAD (2000) and nuclear stress myoview WNL per patient report (2009). US ECHOCARDIOGRAPHY 2009 mild aortic/mitral insuff, ER 75%, mild diastolic dysfunction US ECHO 2013 per report EF 50-55%, mild AI, mild MR, aortic root mildly dilated.   Requests we take over lyrica - she receives this from Dr Cheyenne Pierce [facility]). Requests we prescribe this given long wait time at pain management. She states she is no longer taking narcotic.   Relevant past medical, surgical, family  and social history reviewed and updated as indicated. Interim medical history since our last visit reviewed. Allergies and medications reviewed and updated. Outpatient Medications Prior to Visit  Medication Sig Dispense Refill  . acetaminophen (TYLENOL) 500 MG tablet Take 500 mg by mouth every 6 (six) hours as needed.    Marland Kitchen amLODipine (NORVASC) 2.5 MG tablet Take 1 tablet (2.5 mg total) by mouth daily. 90 tablet 1  . aspirin EC 81 MG tablet Take 81 mg by mouth as needed.    . ASTRAGALUS PO Take 500 mg by mouth daily.    . benzonatate (TESSALON) 100 MG capsule take 1 capsule by mouth twice a day if needed for cough 30 capsule 0  . Biotin 1000 MCG tablet Take 1,000 mcg by mouth daily.    . calcium-vitamin D (OSCAL WITH D) 500-200 MG-UNIT tablet Take 1 tablet by mouth.    . diphenhydrAMINE (BENADRYL) 25 mg capsule Take 25 mg by mouth at bedtime.     . hyoscyamine (LEVSIN, ANASPAZ) 0.125 MG tablet Take 1 tablet (0.125 mg total) by mouth 2 (two) times daily as needed. 30 tablet 3  . levothyroxine (SYNTHROID, LEVOTHROID) 75 MCG tablet TAKE 1 TABLET BY MOUTH ONCE DAILY 30 tablet 2  . loperamide (IMODIUM) 2 MG capsule Take by mouth as needed for diarrhea or loose stools.    . Magnesium 250 MG TABS Take by mouth daily.    . meclizine (ANTIVERT) 25 MG tablet take 1 tablet by mouth three times a  day if needed 30 tablet 1  . nitroGLYCERIN (NITROSTAT) 0.4 MG SL tablet Place 0.4 mg under the tongue every 5 (five) minutes as needed.    . pantoprazole (PROTONIX) 40 MG tablet Take 1 tablet (40 mg total) by mouth daily. 30 tablet 11  . pregabalin (LYRICA) 150 MG capsule Take 1 capsule (150 mg total) by mouth 3 (three) times daily. 90 capsule 5  . triamterene-hydrochlorothiazide (MAXZIDE-25) 37.5-25 MG tablet take 1 tablet by mouth once daily 30 tablet 4  . HYDROcodone-acetaminophen (NORCO/VICODIN) 5-325 MG tablet Take 1 tablet by mouth daily. 30 tablet 0  . HYDROcodone-acetaminophen (NORCO/VICODIN) 5-325 MG  tablet Take 1 tablet by mouth daily. 30 tablet 0   No facility-administered medications prior to visit.      Per HPI unless specifically indicated in ROS section below Review of Systems     Objective:    BP 120/82 (BP Location: Left Arm, Patient Position: Sitting, Cuff Size: Normal)   Pulse 71   Temp 97.9 F (36.6 C) (Oral)   Wt 138 lb (62.6 kg)   SpO2 96%   BMI 26.95 kg/m   Wt Readings from Last 3 Encounters:  06/15/17 138 lb (62.6 kg)  04/28/17 135 lb (61.2 kg)  02/16/17 135 lb (61.2 kg)    Physical Exam  Constitutional: She appears well-developed and well-nourished. No distress.  HENT:  Mouth/Throat: Oropharynx is clear and moist. No oropharyngeal exudate.  Eyes: Conjunctivae and EOM are normal. Pupils are equal, round, and reactive to light.  Neck: Normal range of motion. Neck supple. No thyromegaly present.  Cardiovascular: Normal rate, regular rhythm and intact distal pulses. Frequent extrasystoles are present.  Murmur (2/6 systolic) heard. Pulmonary/Chest: Effort normal and breath sounds normal. No respiratory distress. She has no wheezes. She has no rales.  Musculoskeletal: She exhibits no edema.  Skin: Skin is warm and dry. No rash noted.  Psychiatric: She has a normal mood and affect.  Nursing note and vitals reviewed.  Results for orders placed or performed in visit on 01/29/17  [employer] Select 13 (MW), Urine  Result Value Ref Range   Summary FINAL    Lab Results  Component Value Date   TSH 0.47 01/07/2017    EKG - NSR rate 70, LAD, normal intervals, no acute ST/T changes, poor R wave progression unchanged from prior, 2 PVCs     Assessment & Plan:   Problem List Items Addressed This Visit    Chest pain - Primary    Overall atypical description of chest pain. She has previously had unrevealing cardiac evaluation. Will check EKG, labs today for further evaluation.  EKG - nonacute.  Reviewing chart, she is overdue for cards f/u - will go ahead and  refer.       Relevant Orders   EKG 12-Lead (Completed)   Comprehensive metabolic panel   TSH   CBC with Differential/Platelet   Ambulatory referral to Cardiology   Essential hypertension    Chronic, stable. Continue amlodipine 2.5mg , maxzide.       Fibromyalgia (Chronic)    She is on high dose lyrica 150mg  TID, previously managed by Dr [facility]. Pt now off narcotics, requests I take over lyrica prescription. As ok by pain management, discussed I would be willing to continue prescribing. Kidney function seems to be tolerating this regimen. Will take over Rx when next refill due.      HLD (hyperlipidemia)    Chronic. Not on statin. Update LDL.  Relevant Orders   LDL Cholesterol, Direct   Hypothyroidism    Update TFTs. Compliant with levothyroxine 54mcg daily.       PVCs (premature ventricular contractions)    Update EKG. Has not tolerated beta blockers well in the past.       Relevant Orders   Ambulatory referral to Cardiology       No orders of the defined types were placed in this encounter.  Orders Placed This Encounter  Procedures  . Comprehensive metabolic panel  . TSH  . CBC with Differential/Platelet  . LDL Cholesterol, Direct  . Ambulatory referral to Cardiology    Referral Priority:   Routine    Referral Type:   Consultation    Referral Reason:   Specialty Services Required    Requested Specialty:   Cardiology    Number of Visits Requested:   1  . EKG 12-Lead    Follow up plan: Return if symptoms worsen or fail to improve.  Ria Bush, MD

## 2017-06-15 NOTE — Assessment & Plan Note (Addendum)
Overall atypical description of chest pain. She has previously had unrevealing cardiac evaluation. Will check EKG, labs today for further evaluation.  EKG - nonacute.  Reviewing chart, she is overdue for cards f/u - will go ahead and refer.

## 2017-06-15 NOTE — Patient Instructions (Addendum)
EKG today - overall stable.  Labs today.  We will be in touch with results.  Return to see Dr Fletcher Anon for follow up visit as you're due - referral placed today. See [facility] Ferries or [employer].  Ok to continue lyrica from our office.

## 2017-06-15 NOTE — Assessment & Plan Note (Deleted)
Overall atypical description of chest pain.

## 2017-06-15 NOTE — Assessment & Plan Note (Signed)
Chronic. Not on statin. Update LDL.

## 2017-06-15 NOTE — Assessment & Plan Note (Addendum)
Update TFTs. Compliant with levothyroxine 23mcg daily.

## 2017-06-16 LAB — TSH: TSH: 0.71 u[IU]/mL (ref 0.35–4.50)

## 2017-06-17 ENCOUNTER — Encounter: Payer: Self-pay | Admitting: Family Medicine

## 2017-06-17 ENCOUNTER — Other Ambulatory Visit: Payer: Self-pay | Admitting: Family Medicine

## 2017-06-17 DIAGNOSIS — M629 Disorder of muscle, unspecified: Secondary | ICD-10-CM | POA: Insufficient documentation

## 2017-06-17 DIAGNOSIS — R740 Nonspecific elevation of levels of transaminase and lactic acid dehydrogenase [LDH]: Secondary | ICD-10-CM

## 2017-06-17 DIAGNOSIS — R7401 Elevation of levels of liver transaminase levels: Secondary | ICD-10-CM

## 2017-06-23 ENCOUNTER — Ambulatory Visit
Admission: RE | Admit: 2017-06-23 | Discharge: 2017-06-23 | Disposition: A | Discharge status: Home / Self Care | Payer: Medicare Other | Source: Ambulatory Visit | Attending: Family Medicine | Admitting: Family Medicine

## 2017-06-23 DIAGNOSIS — R740 Nonspecific elevation of levels of transaminase and lactic acid dehydrogenase [LDH]: Secondary | ICD-10-CM | POA: Diagnosis not present

## 2017-06-23 DIAGNOSIS — R7401 Elevation of levels of liver transaminase levels: Secondary | ICD-10-CM

## 2017-06-23 DIAGNOSIS — K8020 Calculus of gallbladder without cholecystitis without obstruction: Secondary | ICD-10-CM | POA: Diagnosis not present

## 2017-06-26 ENCOUNTER — Telehealth: Payer: Self-pay | Admitting: Family Medicine

## 2017-06-26 NOTE — Telephone Encounter (Signed)
Copied from [place] [id number]. Topic: Quick Communication - See Telephone Encounter >> Jun 26, 2017  4:50 PM Bea Graff, NT wrote: CRM for notification. See Telephone encounter for: 06/26/17. Pt would like a call with her lab results and [place] results. She would like the results mailed as well.

## 2017-06-26 NOTE — Telephone Encounter (Signed)
Spoke with pt relaying lab results.  Informed pt we have not received [place] results yet but will call as soon as we do.  Also, informed pt I will mail her lab results. Pt verbalizes understanding and expresses her thanks.

## 2017-07-06 ENCOUNTER — Other Ambulatory Visit: Payer: Self-pay | Admitting: Family Medicine

## 2017-07-14 ENCOUNTER — Ambulatory Visit: Payer: Medicare Other | Admitting: Cardiovascular Disease

## 2017-07-18 NOTE — Progress Notes (Addendum)
Cardiology Office Note  Date:  07/22/2017   ID:  Cheyenne Pierce, DOB 09/29/1934, MRN [id number]  PCP:  Ria Bush, MD   Chief Complaint  Patient presents with  . other    Last seen in 2017.  Pt. c/o chest pain, shortness of breath, irreg. heart beats, some swelling in feet and ankles.     HPI:  Cheyenne Pierce is a 83 y.o. female with history of  nonobstructive cardiac cath,  recurrent chest pain throughout her life,  multiple medication intolerances,  severe GERD,  hypothyroidism,  prior tobacco abuse anxiety/depression  cervical spine fusion on 06/05/2015  Who presents for symptoms of chest pain and shortness of breath  Reports long history of chest pain Previous workup with numerous stress tests over the years as well as echocardiogram "Husband feels it is my gallbladder"  Multiple mobile gallstones measuring up to 5 mm on ultrasound, results reviewed with her   Denies having any chest pain on exertion Will be at rest when she will develop several minutes of discomfort in her chest  does not seem to radiate  otherwise active but walks with a walker   as in the past, symptoms last for several minutes before resolving on their own  Previous cardiac workup discussed with her in detail Previous workup for similar symptoms of palpitations shortness of breath chest pain cardiac evaluation in 2009 by Dr. Harrington Challenger echo ,  adenosine Myoview in which both were unremarkable.  Lexiscan in 11/2011 that showed no evidence of ischemia with a normal EF.   echo in 11/2011 that showed an EF of 50-55%, no RWMA, GR1DD, mild AI, aortic root was mildly dilated, mild MR, PASP was normal.   Previously tried on metoprolol but had nightmares  changed to Bystolic which had a similar effect.    Previous notes indicating some chest pain when she gets upset  severe back and neck pain limit her from exerting herself    Her functional capacity is currently <4 METS, mostly limited by her neck  and back pain.   EKG personally reviewed by myself on todays visit Shows normal sinus rhythm rate 72 bpm left axis deviation no significant ST or T wave changes   PMH:   has a past medical history of Allergy, Angina pectoris ([place]), Anxiety, Cancer ([place]), Cataract, Chronic back pain, DDD (degenerative disc disease), cervical, DDD (degenerative disc disease), cervical, DDD (degenerative disc disease), lumbar, Dental bridge present, Depression, Diverticulosis, GERD (gastroesophageal reflux disease), Headache, Heart murmur, History of bronchitis, History of colon polyps, History of gastric ulcer, History of hiatal hernia, Hypertension, Hypothyroidism, IBS (irritable bowel syndrome), Insomnia, Joint pain, Joint swelling, Mass of vagina, Nocturia, Peripheral neuropathy, Peripheral neuropathy, Urinary frequency, Urinary urgency, and Vertigo.  PSH:    Past Surgical History:  Procedure Laterality Date  . ABDOMINAL HYSTERECTOMY  1968   left ovary remains  . ANTERIOR CERVICAL DECOMPRESSION/DISCECTOMY FUSION 4 LEVELS N/A 06/05/2015   Procedure: ANTERIOR CERVICAL DECOMPRESSION/DISCECTOMY FUSION CERVICAL THREE-FOUR,CERVICAL FOUR-FIVE,CERVICAL FIVE-SIX,CERVICAL SIX-SEVEN;  Surgeon: Earnie Larsson, MD;  Location: [facility];  Service: Neurosurgery;  Laterality: N/A;  right side approach  . APPENDECTOMY  1948  . bladder suspension with vaginal sling    . CARDIAC CATHETERIZATION  09/26/1998  . CATARACT EXTRACTION W/PHACO Left 10/15/2015   Procedure: CATARACT EXTRACTION PHACO AND INTRAOCULAR LENS PLACEMENT (IOC) LEFT EYE;  Surgeon: Ronnell Freshwater, MD;  Location: [facility];  Service: Ophthalmology;  Laterality: Left;  LEFT LEAVE PT LAST  . CATARACT EXTRACTION  W/PHACO Right 11/26/2015   Procedure: CATARACT EXTRACTION PHACO AND INTRAOCULAR LENS PLACEMENT (IOC);  Surgeon: Ronnell Freshwater, MD;  Location: [facility];  Service: Ophthalmology;  Laterality: Right;  RIGHT  . COLONOSCOPY   07/2009   [facility]), normal per pt  . COLONOSCOPY  06/2014   int hem, rpt 5 yrs ([name])  . COLOSTOMY W/ RECTOCELE REPAIR  06/2004  . DEXA  2013   T -1.0  . epidural infections    . epidural steroid injections  multiple latest 05/2014, 07/2014   help periph neuropathy [name])  . ESOPHAGOGASTRODUODENOSCOPY    . EYE SURGERY Left 10/15/2015   cataract removal  . HERNIA REPAIR    . NM MYOVIEW LTD  2009   WNL per report  . OVARIAN CYST SURGERY  1963   cyst on ovaries  . TONSILLECTOMY    . US ECHOCARDIOGRAPHY  2009   mild aortic/mitral insuff, ER 40%, mild diastolic dysfunction  . VENTRAL HERNIA REPAIR  08/2008   abdominal wall, lysis of adhesions    Current Outpatient Medications  Medication Sig Dispense Refill  . acetaminophen (TYLENOL) 500 MG tablet Take 500 mg by mouth every 6 (six) hours as needed.    Marland Kitchen amLODipine (NORVASC) 2.5 MG tablet Take 1 tablet (2.5 mg total) by mouth daily. 90 tablet 1  . aspirin EC 81 MG tablet Take 81 mg by mouth as needed.    . ASTRAGALUS PO Take 500 mg by mouth daily.    . benzonatate (TESSALON) 100 MG capsule take 1 capsule by mouth twice a day if needed for cough 30 capsule 0  . Biotin 1000 MCG tablet Take 1,000 mcg by mouth daily.    . calcium-vitamin D (OSCAL WITH D) 500-200 MG-UNIT tablet Take 1 tablet by mouth.    . diphenhydrAMINE (BENADRYL) 25 mg capsule Take 25 mg by mouth at bedtime.     . hyoscyamine (LEVSIN, ANASPAZ) 0.125 MG tablet Take 1 tablet (0.125 mg total) by mouth 2 (two) times daily as needed. 30 tablet 3  . levothyroxine (SYNTHROID, LEVOTHROID) 75 MCG tablet TAKE 1 TABLET BY MOUTH ONCE DAILY 30 tablet 2  . loperamide (IMODIUM) 2 MG capsule Take by mouth as needed for diarrhea or loose stools.    . Magnesium 250 MG TABS Take by mouth daily.    . meclizine (ANTIVERT) 25 MG tablet take 1 tablet by mouth three times a day if needed 30 tablet 1  . nitroGLYCERIN (NITROSTAT) 0.4 MG SL tablet Place 0.4 mg under the tongue every 5  (five) minutes as needed.    . pantoprazole (PROTONIX) 40 MG tablet Take 1 tablet (40 mg total) by mouth daily. 30 tablet 11  . pregabalin (LYRICA) 150 MG capsule Take 1 capsule (150 mg total) by mouth 3 (three) times daily. 90 capsule 5  . triamterene-hydrochlorothiazide (MAXZIDE-25) 37.5-25 MG tablet TAKE 1 TABLET BY MOUTH ONCE DAILY 30 tablet 11   No current facility-administered medications for this visit.      Allergies:   Codeine; Penicillins; Prednisone; Zoloft [sertraline hcl]; Amoxicillin; Codeine phosphate; Lactose intolerance (gi); Metronidazole; Perindopril erbumine; Potassium-containing compounds; Prozac [fluoxetine hcl]; Tramadol hcl; and Oxcarbazepine   Social History:  The patient  reports that she has quit smoking. She has a 30.00 pack-year smoking history. She has never used smokeless tobacco. She reports that she does not drink alcohol or use drugs.   Family History:   family history includes Heart disease in her mother; Stroke in her brother.  Review of Systems: Review of Systems  Respiratory: Positive for shortness of breath.   Cardiovascular: Positive for chest pain and palpitations.     PHYSICAL EXAM: VS:  BP 140/82 (BP Location: Left Arm, Patient Position: Sitting, Cuff Size: Normal)   Pulse 72   Ht 5' (1.524 m)   Wt 140 lb 8 oz (63.7 kg)   BMI 27.44 kg/m  , BMI Body mass index is 27.44 kg/m. GEN: Well nourished, well developed, in no acute distress  HEENT: normal  Neck: no JVD, carotid bruits, or masses Cardiac: RRR; no murmurs, rubs, or gallops,no edema  Respiratory:  clear to auscultation bilaterally, normal work of breathing GI: soft, nontender, nondistended, + BS MS: no deformity or atrophy  Skin: warm and dry, no rash Neuro:  Strength and sensation are intact Psych: euthymic mood, full affect    Recent Labs: 06/15/2017: ALT 17; BUN 12; Creatinine, Ser 0.68; Hemoglobin 13.8; Platelets 275.0; Potassium 3.5; Sodium 137; TSH 0.71    Lipid  Panel Lab Results  Component Value Date   CHOL 203 (H) 10/28/2016   HDL 55.90 10/28/2016   LDLCALC 111 (H) 10/28/2016   TRIG 180.0 (H) 10/28/2016      Wt Readings from Last 3 Encounters:  07/22/17 140 lb 8 oz (63.7 kg)  06/15/17 138 lb (62.6 kg)  04/28/17 135 lb (61.2 kg)       ASSESSMENT AND PLAN:  Chest pain, unspecified type - Plan: EKG 12-Lead No further testing needed at this time given atypical nature of symptoms Long discussion with her, similar symptoms that she has had for many years Previous testing unrevealing We have suggested if symptoms do get worse especially if they present with exertion stress test could be ordered home Other options include CT coronary calcium scoring for risk stratification We have renewed her nitroglycerin.  Unable to exclude spasm, esophageal or coronary We do need to entertain gallbladder disease but she denies any discomfort after eating All of the above was discussed with her in detail.  She feels comfortable with no additional testing at this time.  Reassured her concerning mild aortic valve sclerosis without significant stenosis on previous echocardiogram.  Valve is not causing her symptoms   essential hypertension Blood pressure is well controlled on today's visit. No changes made to the medications.  Fibromyalgia May be contributing to her chest pain symptoms as above  Chronic pain syndrome Long history of chronic pain Previous cardiac workup, appears to have had stress tests every several years normal echocardiograms,   Hyperlipidemia, unspecified hyperlipidemia type Currently not on a statin  Disposition:   F/U as needed   Total encounter time more than 60 minutes  Greater than 50% was spent in counseling and coordination of care with the patient    Orders Placed This Encounter  Procedures  . EKG 12-Lead     Signed, Esmond Plants, M.D., Ph.D. 07/22/2017  [place],  [place]

## 2017-07-22 ENCOUNTER — Ambulatory Visit: Payer: Medicare Other | Admitting: Cardiovascular Disease

## 2017-07-22 ENCOUNTER — Encounter: Payer: Self-pay | Admitting: Cardiovascular Disease

## 2017-07-22 VITALS — BP 140/82 | HR 72 | Ht 60.0 in | Wt 140.5 lb

## 2017-07-22 DIAGNOSIS — E785 Hyperlipidemia, unspecified: Secondary | ICD-10-CM

## 2017-07-22 DIAGNOSIS — R079 Chest pain, unspecified: Secondary | ICD-10-CM

## 2017-07-22 DIAGNOSIS — I10 Essential (primary) hypertension: Secondary | ICD-10-CM

## 2017-07-22 DIAGNOSIS — M797 Fibromyalgia: Secondary | ICD-10-CM | POA: Diagnosis not present

## 2017-07-22 DIAGNOSIS — G894 Chronic pain syndrome: Secondary | ICD-10-CM | POA: Diagnosis not present

## 2017-07-22 MED ORDER — NITROGLYCERIN 0.4 MG SL SUBL: 0.4000 mg | SUBLINGUAL_TABLET | SUBLINGUAL | 2 refills | Status: AC | PRN

## 2017-07-22 NOTE — Patient Instructions (Signed)
Medication Instructions:   No medication changes made  Take nitro as needed for chest pain  Labwork:  No new labs needed  Testing/Procedures:  No further testing at this time   Follow-Up: It was a pleasure seeing you in the office today. Please call us if you have new issues that need to be addressed before your next appt.  [phone]  Your physician wants you to follow-up in: as needed  If you need a refill on your cardiac medications before your next appointment, please call your pharmacy.  For educational health videos Log in to : [email or web address] Or : [email or web address], password : [email or web address]

## 2017-09-01 ENCOUNTER — Telehealth: Payer: Self-pay | Admitting: Family Medicine

## 2017-09-01 MED ORDER — LEVOTHYROXINE SODIUM 75 MCG PO TABS: 75.0000 ug | ORAL_TABLET | Freq: Every day | ORAL | 5 refills | Status: DC

## 2017-09-01 NOTE — Telephone Encounter (Signed)
Copied from [place] [id number]. Topic: Quick Communication - Rx Refill/Question >> Sep 01, 2017 12:02 PM Aurelio Brash B wrote: Medication: levothyroxine (SYNTHROID, LEVOTHROID) 75 MCG tablet  Has the patient contacted their pharmacy? yes (Agent: If no, request that the patient contact the pharmacy for the refill.) (Agent: If yes, when and what did the pharmacy advise?)  Preferred Pharmacy (with phone number or street name): [id number] - [name], [place] - [place] [phone] (Phone) [phone] (Fax)      Agent: Please be advised that RX refills may take up to 3 business days. We ask that you follow-up with your pharmacy.

## 2017-09-02 DIAGNOSIS — G501 Atypical facial pain: Secondary | ICD-10-CM | POA: Diagnosis not present

## 2017-09-02 DIAGNOSIS — H9209 Otalgia, unspecified ear: Secondary | ICD-10-CM | POA: Diagnosis not present

## 2017-09-09 ENCOUNTER — Ambulatory Visit: Payer: Self-pay | Admitting: *Deleted

## 2017-09-09 NOTE — Telephone Encounter (Signed)
Patient would like to discuss options for replacing  Lyrica  Patient has 30 day supply ready at the pharmacy at [facility] which she is going to pick up. It is  174.00 co pay out of pocket. Patient states she cannot afford to continue taking this medication. Patient is seeking a less expensive alternative that will control her pain and replace if possible the Lyrica . Please advise on the best plan of care to come off the Lyrica . She states she will continue taking the medication as prescribed until she hears from Dr Danise Mina

## 2017-09-10 NOTE — Telephone Encounter (Addendum)
I had agreed to continue filling lyrica when next refill was due - but we had not started prescribing yet.  An option to try may be gabapentin for neuropathic pain in place of lyrica. I would also suggest she contact Dr Adalberto Cole office for recommendations. If she wants to try gabapentin, let me know and we will discuss slow cross taper

## 2017-09-10 NOTE — Telephone Encounter (Signed)
Spoke with pt relaying Dr. Synthia Innocent message.  Pt verbalizes understanding and agrees to resume gabapentin.  Says she just picked up last Lyrica 30-day rx.  Says she will also contact Dr. [name] to discuss recommendations.

## 2017-09-14 MED ORDER — GABAPENTIN 300 MG PO CAPS: 300.0000 mg | ORAL_CAPSULE | Freq: Two times a day (BID) | ORAL | 3 refills | Status: DC

## 2017-09-14 NOTE — Addendum Note (Signed)
Addended by: Ria Bush on: 09/14/2017 07:37 AM   Modules accepted: Orders

## 2017-09-14 NOTE — Telephone Encounter (Addendum)
Spoke with pt relaying instructions per Dr. Darnell Level.  Pt verbalizes understanding and expresses her thanks.

## 2017-09-14 NOTE — Telephone Encounter (Signed)
Ok recommend decrease lyrica to twice daily for 2 weeks, start gabapentin 300mg  nightly at same time.  After 2 weeks, decrease lyrica to 150mg  once daily and increase gabpentin to 300mg  bid.  Update [place] with how she's doing with his.

## 2017-09-15 IMAGING — CT CT CERVICAL SPINE W/O CM
2 series · 10 of 14 positions shown, 12 images · non-contrast
Comparison: Outside cervical spine MRI 01/24/2015.

CLINICAL DATA: 81-year-old female with severe cervical neck pain
radiating to the shoulder blades. Progressive symptoms since
cervical fusion in [REDACTED]. Subsequent encounter.

EXAM:
CT CERVICAL SPINE WITHOUT CONTRAST
TECHNIQUE: Multidetector CT imaging of the cervical spine was performed without
intravenous contrast. Multiplanar CT image reconstructions were also
generated.

[Series 3: c spine soft · axial · 0.35mm/px · z∈[-152,-48]mm · 4 of 88 slices shown]
[im 18/88  soft-tissue]
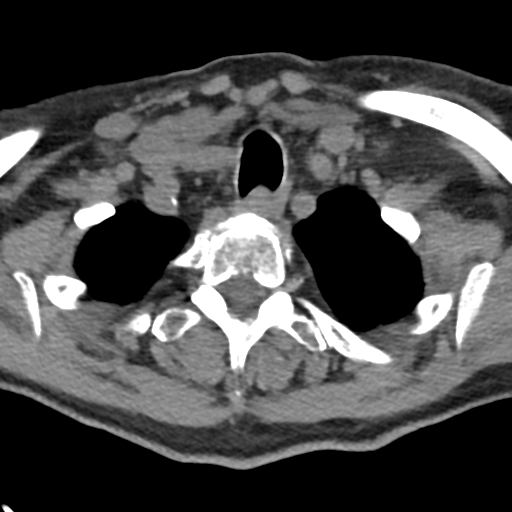
[im 35/88  soft-tissue]
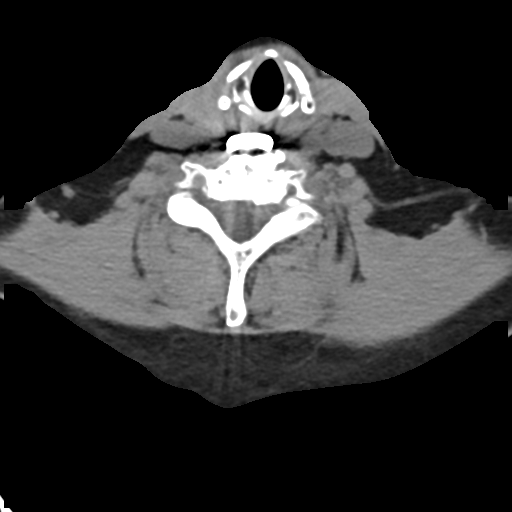
[im 53/88  soft-tissue]
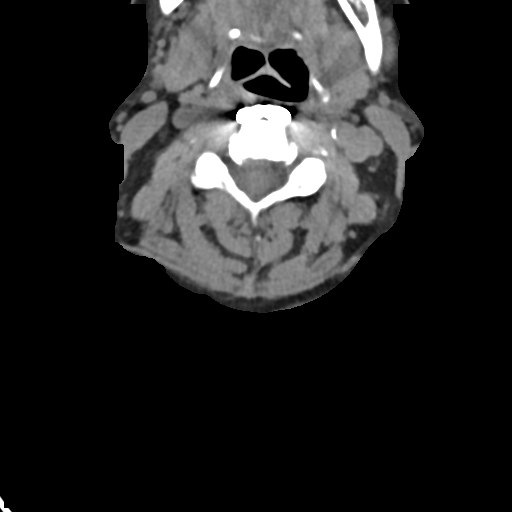
[im 70/88  soft-tissue]
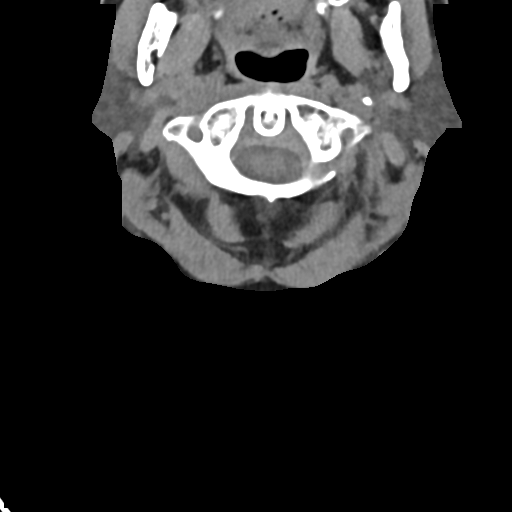

[Series 7: orthogonal bone · axial · 0.23mm/px · z∈[-199,-64]mm · 6 of 105 slices shown, 8 images]
[im 15/105  soft-tissue]
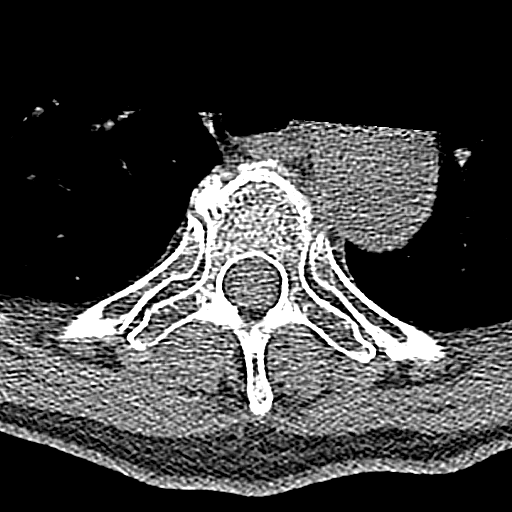
[im 15/105  bone]
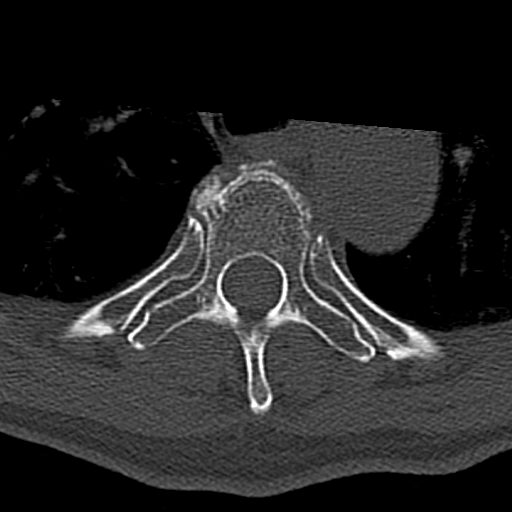
[im 30/105  bone]
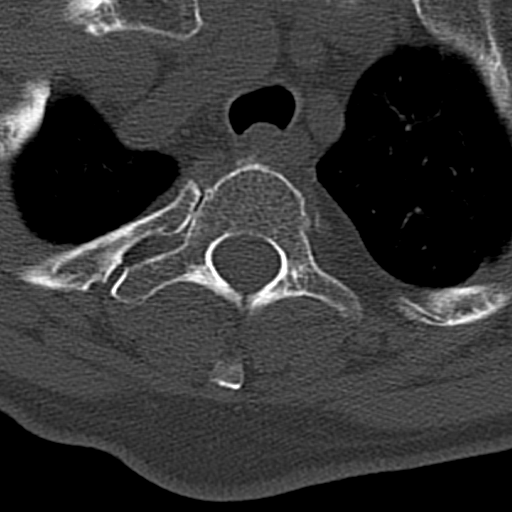
[im 45/105  bone]
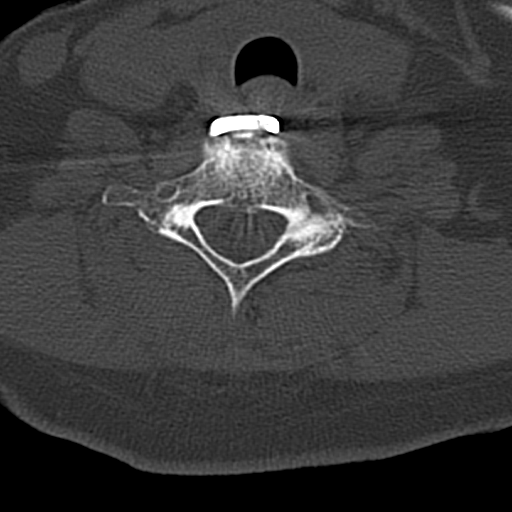
[im 60/105  bone]
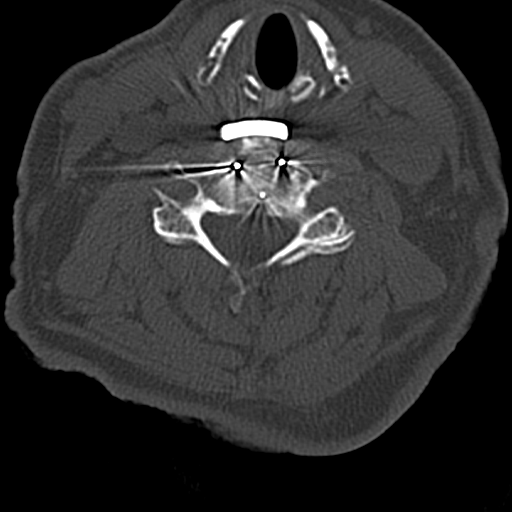
[im 75/105  soft-tissue]
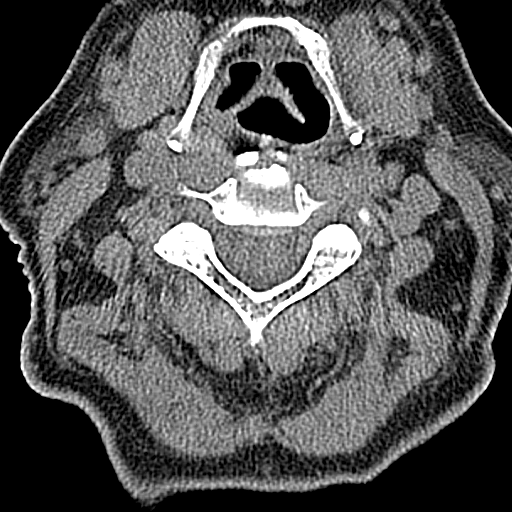
[im 75/105  bone]
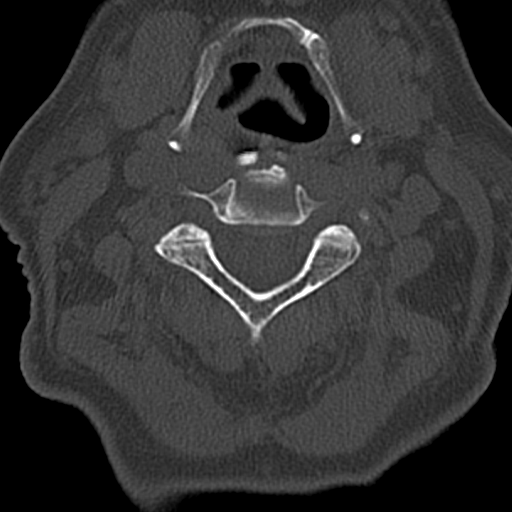
[im 90/105  bone]
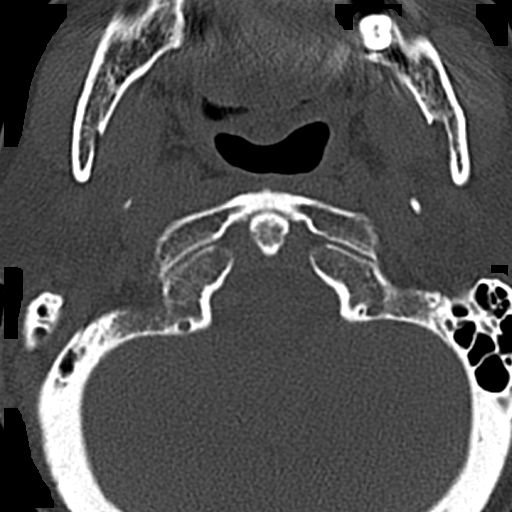

[10 of 14 positions shown; findings below may reference images not displayed]

Preoperative
cervical spine CT 04/12/2015. Intraoperative images 00002.
Postoperative [HOSPITAL] neurosurgery cervical radiographs 07/04/2015.
FINDINGS: Improved cervical lordosis compared to the preoperative studies.
Visualized skull base is intact. No atlanto-occipital dissociation.
Bilateral posterior element alignment is within normal limits. Trace
anterolisthesis at C7-T1 and T1-T2 is stable with associated mild to
moderate chronic facet hypertrophy. Visible upper thoracic levels
appear stable and intact.

Negative visualized posterior fossa. Stable and well pneumatized
visualized paranasal sinuses and mastoids. Prevertebral and
paraspinal soft tissues appear normal. Calcified atherosclerosis re-
demonstrated at the carotid bifurcations. Negative lung apices.
Calcified atherosclerosis of the visible aortic arch.

C2-C3: Subtle disc bulging and mild ligament flavum hypertrophy may
be increased. Mild right facet hypertrophy is stable. No significant
stenosis.

C3-C4: C3 and C4 cortical screws appear intact without loosening.
Interbody implant placement appears stable. There is evidence of
developing interbody ossification to the left of midline (coronal
image 23), but no convincing arthrodesis at this time. Mild
uncovertebral hypertrophy on the left.

C4-C5: C4 and C5 cortical screws appear intact without loosening.
Interbody implant placement is stable. No interbody arthrodesis at
this time. Mild to moderate facet and uncovertebral hypertrophy re-
demonstrated greater on the left.

C5-C6: C5 and C6 cortical screws appear intact without loosening.
Stable interbody implant. No interbody arthrodesis at this time.
Left side facet and uncovertebral hypertrophy re- demonstrated.

C6-C7: C6 and C7 cortical screws appear intact. There is mild
lucency surrounding the left C7 screw best seen on coronal image 18.
Interbody implant positioning is stable. No interbody arthrodesis at
this time. Mild-to-moderate facet and uncovertebral hypertrophy re -
demonstrated Klpigbb greater on the left.

C7-T1: Stable mild to moderate facet and mild uncovertebral
hypertrophy.
IMPRESSION: 1. ACDF from the C3 to the C7 level. There is mild lucencies
surrounding the left C7 cortical screw which may indicate loosening.
Otherwise no adverse hardware features, and interbody implant
placement appears stable.
2. No new findings in the cervical spine.

## 2017-10-07 ENCOUNTER — Telehealth: Payer: Self-pay | Admitting: Family Medicine

## 2017-10-07 ENCOUNTER — Ambulatory Visit: Payer: Medicare Other | Admitting: Nurse Practitioner

## 2017-10-07 DIAGNOSIS — M5416 Radiculopathy, lumbar region: Secondary | ICD-10-CM

## 2017-10-07 DIAGNOSIS — M48061 Spinal stenosis, lumbar region without neurogenic claudication: Secondary | ICD-10-CM

## 2017-10-07 MED ORDER — GABAPENTIN 300 MG PO CAPS: ORAL_CAPSULE | ORAL | 3 refills | Status: DC

## 2017-10-07 NOTE — Telephone Encounter (Signed)
When she runs out of lyrica, let's increase gabapentin to 300mg  in am and afternoon, and then 600mg  at bedtime. New dose sent to pharmacy.  She may also take 2 extra strength tylenol 500mg  twice daily.  Update [place] with effect. Who does she want [place] to refer her to? There's no [facility] that I'm aware of.

## 2017-10-07 NOTE — Telephone Encounter (Signed)
Copied from [place] [id number]. Topic: Quick Communication - See Telephone Encounter >> Oct 07, 2017 10:17 AM Rutherford Nail, NT wrote: CRM for notification. See Telephone encounter for: 10/07/17. Dr Darnell Level wanted to help her with a change in medication. She was to decrease the lyrica and increase the gabapentin. States that Dr Darnell Level wanted her to inform him of how she was doing. States that she is doing everything like he told her to, but she needs help in dealing with the pain that she is having to deal with. States that he wanted her to not take so much tylenol.  CB#: [phone]

## 2017-10-07 NOTE — Telephone Encounter (Signed)
Spoke with pt asking what she is taking.  Says she is taking Extra Tylenol about 3 pills a day.  Pt is taking 1 Lyrica in AM, then gabapentin 300 mg 1 tab at lunchtime and 1 tab at bedtime.  Says Friday is her last day with Lyrica but her pain is still so severe.  Pt is requesting a referral to [facility] new spine doctors.

## 2017-10-07 NOTE — Telephone Encounter (Signed)
Spoke with pt relaying instructions per Dr. Darnell Level.  Pt verbalizes understanding.   Pt says there is a new clinic called [facility] at [facility]. Says the doctors are:  John Barr Bracken Elayne Guerin

## 2017-10-07 NOTE — Telephone Encounter (Signed)
Please call - what is she currently taking? How much gabapentin, how much tylenol? Is she fully off lyrica now?

## 2017-10-09 NOTE — Telephone Encounter (Signed)
Ok referral placed.

## 2017-10-09 NOTE — Telephone Encounter (Signed)
Referral faxed to [facility] and patient has been notified.

## 2017-10-09 NOTE — Addendum Note (Signed)
Addended by: Ria Bush on: 10/09/2017 07:33 AM   Modules accepted: Orders

## 2017-10-12 DIAGNOSIS — H16223 Keratoconjunctivitis sicca, not specified as Sjogren's, bilateral: Secondary | ICD-10-CM | POA: Diagnosis not present

## 2017-10-16 ENCOUNTER — Ambulatory Visit: Payer: Self-pay

## 2017-10-16 DIAGNOSIS — M545 Low back pain: Secondary | ICD-10-CM | POA: Diagnosis not present

## 2017-10-16 DIAGNOSIS — G8929 Other chronic pain: Secondary | ICD-10-CM | POA: Diagnosis not present

## 2017-10-16 DIAGNOSIS — M542 Cervicalgia: Secondary | ICD-10-CM | POA: Diagnosis not present

## 2017-10-16 MED ORDER — HYOSCYAMINE SULFATE 0.125 MG PO TABS: 0.1250 mg | ORAL_TABLET | Freq: Two times a day (BID) | ORAL | 1 refills | Status: DC | PRN

## 2017-10-16 NOTE — Telephone Encounter (Addendum)
Needs office visit. Recommend decrease gabapentin in interim to 300mg  bid or just stop.  I've refilled levsin. If worsening symptoms over weekend, seek urgent care.  plz call Monday to schedule appt next week.

## 2017-10-16 NOTE — Addendum Note (Signed)
Addended by: Ria Bush on: 10/16/2017 05:32 PM   Modules accepted: Orders

## 2017-10-16 NOTE — Telephone Encounter (Signed)
Spoke with pt relaying message and instructions per Dr. G.  Pt verbalizes understanding. 

## 2017-10-16 NOTE — Telephone Encounter (Signed)
Outgoing call to patient to assess reason for call. Patient states that since change in medication  her IBS is more irritating. Experiencing more diarrhea, stomach pain pressure in stomach  Pain is rated  " 6"   Experiencing chills. Did not ask if fever was present.  Taking tylenol for pain.  Feels pressure in abdomin.  States " stomach is burned up"  Itching is rated severe.  Has not had any sleep rlt itching.  Patient is to take Neurotin 300mg  in am and lunch time and 600mg  in evening.  Patient reports  a lot of nausea. Patient states she has not taken morning does of gabapentin.  Feels she is having a reaction to  To Neurontin. Patient also states if Dr. Geraldine Solar her to continue to take hyoscyamine ( Levsin)  She only has  2 pills left.  Would need a refill called to [facility]  on [facility] [address].  Would like advise related to reaction to new  Medication.  Patient states she has an appointment today at 1:00 pm  But will have access to her cell phone [phone]. Awaits advice  Answer Assessment - Initial Assessment Questions 1. SYMPTOMS: "Do you have any symptoms?" pt. Complains of irritable bowel symptoms worse with taken ne meds 2. SEVERITY: If symptoms are present, ask "Are they mild, moderate or severe?"     Patient rate symptoms mild to moderate  Protocols used: MEDICATION QUESTION CALL-A-AH

## 2017-10-20 ENCOUNTER — Ambulatory Visit: Payer: Self-pay | Admitting: Family Medicine

## 2017-10-20 DIAGNOSIS — T50905A Adverse effect of unspecified drugs, medicaments and biological substances, initial encounter: Secondary | ICD-10-CM | POA: Diagnosis not present

## 2017-10-20 DIAGNOSIS — R21 Rash and other nonspecific skin eruption: Secondary | ICD-10-CM | POA: Diagnosis not present

## 2017-10-20 DIAGNOSIS — M546 Pain in thoracic spine: Secondary | ICD-10-CM | POA: Diagnosis not present

## 2017-10-20 DIAGNOSIS — R110 Nausea: Secondary | ICD-10-CM | POA: Diagnosis not present

## 2017-10-20 NOTE — Telephone Encounter (Addendum)
plz call tomorrow for an update on itching and facial swelling presumed due to gabapentin reaction.

## 2017-10-20 NOTE — Telephone Encounter (Signed)
Christie Nottingham called in for her mother, the pt however Ms Galster was with her talking with me via speaker phone.   Tye Maryland is an Therapist, sports per Ms. Glymph.  Ms. Galas is c/o having a very itchy rash on both of her arms, her back, and face.   She feels her face is swollen.   Denies any swelling inside mouth of gums or tongue.  Speech is clear and appropriate.   No difficulty breathing per pt and her daughter.    Just severe itching that is much worse today than over the last 3-4 days.  She was on Lyrica but was switched to Gabapentin due to the high co-pay for the Lyrica.   The rash started after she started taking the Gabapentin.  There were no openings with any of the providers at the [facility] office or with  Her PCP Dr. Danise Mina.   Tye Maryland is going to take her mother to the urgent care now.   I agreed due to the facial swelling that she should be seen as soon as possible and to go on to the urgent care or ED now. Pt and her daughter agreed with this plan and are going to the urgent care now.     Reason for Disposition . Patient sounds very sick or weak to the triager  Answer Assessment - Initial Assessment Questions 1. DESCRIPTION: "Describe the itching you are having." "Where is it located?"     I've been itching for 3-4 days.   Today is much worse.   My face is swollen, my arms and my back.    Denies swelling in mouth at all.   Speech is clear.  I cannot take antibiotics.   No antibiotics recently.       My stomach is all messed up.   Taking Imodium for the diarrhea.  It helps some.    They started me on Gabapentin.    Saturday I've been taking Protonix and Hycosamine.    2. SEVERITY: "How bad is it?"    - MILD - doesn't interfere with normal activities   - MODERATE - SEVERE: interferes with work, school, sleep, or other activities      I was itching all day. 3. SCRATCHING: "Are there any scratch marks? Bleeding?"     *No Answer* 4. ONSET: "When did the itching begin?"      3-4 days. 5.  CAUSE: "What do you think is causing the itching?"      Gabapentin was started and all this rash started.  Was on Lyrica.   She could not afford the co-pay. 6. OTHER SYMPTOMS: "Do you have any other symptoms?"      No 7. PREGNANCY: "Is there any chance you are pregnant?" "When was your last menstrual period?"     N/A  Protocols used: ITCHING - LOCALIZED-A-AH

## 2017-10-21 NOTE — Telephone Encounter (Signed)
Spoke with pt says she is doing better after being seen at [facility] in [place].  They stopped the gabapentin and started pt on Lyrica and "something" for the itching.  Says she will bring the meds and paperwork from UC visit to her OV on Fri.

## 2017-10-23 ENCOUNTER — Ambulatory Visit: Payer: Medicare Other | Admitting: Family Medicine

## 2017-10-23 ENCOUNTER — Encounter: Payer: Self-pay | Admitting: Family Medicine

## 2017-10-23 VITALS — BP 118/70 | HR 78 | Temp 98.3°F | Ht 60.0 in | Wt 141.8 lb

## 2017-10-23 DIAGNOSIS — G894 Chronic pain syndrome: Secondary | ICD-10-CM

## 2017-10-23 DIAGNOSIS — M797 Fibromyalgia: Secondary | ICD-10-CM

## 2017-10-23 DIAGNOSIS — M546 Pain in thoracic spine: Secondary | ICD-10-CM | POA: Insufficient documentation

## 2017-10-23 DIAGNOSIS — R21 Rash and other nonspecific skin eruption: Secondary | ICD-10-CM | POA: Insufficient documentation

## 2017-10-23 DIAGNOSIS — Z66 Do not resuscitate: Secondary | ICD-10-CM | POA: Insufficient documentation

## 2017-10-23 DIAGNOSIS — T50905A Adverse effect of unspecified drugs, medicaments and biological substances, initial encounter: Secondary | ICD-10-CM

## 2017-10-23 MED ORDER — PREGABALIN 150 MG PO CAPS: 150.0000 mg | ORAL_CAPSULE | Freq: Two times a day (BID) | ORAL | 6 refills | Status: DC

## 2017-10-23 MED ORDER — HYDROXYZINE HCL 25 MG PO TABS: 25.0000 mg | ORAL_TABLET | Freq: Two times a day (BID) | ORAL | 0 refills | Status: DC | PRN

## 2017-10-23 MED ORDER — FAMOTIDINE 20 MG PO TABS: 20.0000 mg | ORAL_TABLET | Freq: Two times a day (BID) | ORAL | 0 refills | Status: DC | PRN

## 2017-10-23 NOTE — Assessment & Plan Note (Signed)
We will take over lyrica prescription per patient request. Currently on 150mg  BID. Refilled today.

## 2017-10-23 NOTE — Assessment & Plan Note (Addendum)
Isolated to below eyelids. Has seen ophtho and receiving treatment with eye drops.

## 2017-10-23 NOTE — Progress Notes (Signed)
BP 118/70 (BP Location: Left Arm, Patient Position: Sitting, Cuff Size: Normal)   Pulse 78   Temp 98.3 F (36.8 C) (Oral)   Ht 5' (1.524 m)   Wt 141 lb 12 oz (64.3 kg)   SpO2 94%   BMI 27.68 kg/m    CC: f/u UCC visit Subjective:    Patient ID: Cheyenne Pierce, female    DOB: [birth year], 83 y.o.   MRN: [id number]  HPI: Cheyenne Pierce is a 83 y.o. female presenting on 10/23/2017 for Medication Follow up (Wants to discuss Lyrica and reaction to gabapentin (added to allergy list).  Pt accompanied by her husband.)   See recent phone notes for details.  Briefly, long term on high dose lyrica 150mg  TID. Insurance coverage deteriorated and cost went up this year - to point of becoming unaffordable. We decided to cross taper off lyrica onto gabapentin. She initially did well with this, however last week developed intolerable reaction to gabapentin including nausea, itching, and facial swelling. Seen at [facility], treated with hydroxyzine and pepcid, discontinuation of gabapentin and recommencement of lyrica. Last gabapentin dose on Monday. She did not take prednisone prescribed - due to h/o allergy to this.   Has decided to restart lyrica - children may help her with cost. Taking 150mg  BID.   She established with Dr Joylene Draft neurosurgery, note from 10/16/2017 reviewed. Chronic lumbar radiculopathy, L scapular pain and arm pain. Planned lumbar MRI then f/u with neurosurgery. No orthopedic interventions available for chronic scapular pain. Considering return to [facility] for [place].   2 wk h/o rash to lower eyelids that started after she started gabapentin - saw eye doctor and receiving steroid shots for this.   Also requests DNR form filled out today.   Relevant past medical, surgical, family and social history reviewed and updated as indicated. Interim medical history since our last visit reviewed. Allergies and medications reviewed and updated. Outpatient Medications Prior to Visit    Medication Sig Dispense Refill  . acetaminophen (TYLENOL) 500 MG tablet Take 500 mg by mouth every 6 (six) hours as needed.    Marland Kitchen amLODipine (NORVASC) 2.5 MG tablet Take 1 tablet (2.5 mg total) by mouth daily. 90 tablet 1  . aspirin EC 81 MG tablet Take 81 mg by mouth as needed.    . ASTRAGALUS PO Take 500 mg by mouth daily.    . benzonatate (TESSALON) 100 MG capsule take 1 capsule by mouth twice a day if needed for cough 30 capsule 0  . Biotin 1000 MCG tablet Take 1,000 mcg by mouth daily.    . calcium-vitamin D (OSCAL WITH D) 500-200 MG-UNIT tablet Take 1 tablet by mouth.    . diphenhydrAMINE (BENADRYL) 25 mg capsule Take 25 mg by mouth at bedtime.     . hyoscyamine (LEVSIN, ANASPAZ) 0.125 MG tablet Take 1 tablet (0.125 mg total) by mouth 2 (two) times daily as needed. 30 tablet 1  . levothyroxine (SYNTHROID, LEVOTHROID) 75 MCG tablet Take 1 tablet (75 mcg total) by mouth daily. 30 tablet 5  . loperamide (IMODIUM) 2 MG capsule Take by mouth as needed for diarrhea or loose stools.    . Magnesium 250 MG TABS Take by mouth daily.    . meclizine (ANTIVERT) 25 MG tablet take 1 tablet by mouth three times a day if needed 30 tablet 1  . nitroGLYCERIN (NITROSTAT) 0.4 MG SL tablet Place 1 tablet (0.4 mg total) under the tongue every 5 (five) minutes as needed. 25  tablet 2  . pantoprazole (PROTONIX) 40 MG tablet Take 1 tablet (40 mg total) by mouth daily. 30 tablet 11  . triamterene-hydrochlorothiazide (MAXZIDE-25) 37.5-25 MG tablet TAKE 1 TABLET BY MOUTH ONCE DAILY 30 tablet 11  . famotidine (PEPCID) 20 MG tablet Take 1 tablet by mouth 2 (two) times daily.  0  . hydrOXYzine (ATARAX/VISTARIL) 25 MG tablet Take 1 tablet by mouth 3 (three) times daily as needed.  0  . pregabalin (LYRICA) 150 MG capsule Take 1 capsule (150 mg total) by mouth 3 (three) times daily. (Patient taking differently: Take 150 mg by mouth 2 (two) times daily. ) 90 capsule 5  . gabapentin (NEURONTIN) 300 MG capsule Take one capsule  in the morning, one in the afternoon, two at night 120 capsule 3   No facility-administered medications prior to visit.      Per HPI unless specifically indicated in ROS section below Review of Systems     Objective:    BP 118/70 (BP Location: Left Arm, Patient Position: Sitting, Cuff Size: Normal)   Pulse 78   Temp 98.3 F (36.8 C) (Oral)   Ht 5' (1.524 m)   Wt 141 lb 12 oz (64.3 kg)   SpO2 94%   BMI 27.68 kg/m   Wt Readings from Last 3 Encounters:  10/23/17 141 lb 12 oz (64.3 kg)  07/22/17 140 lb 8 oz (63.7 kg)  06/15/17 138 lb (62.6 kg)    Physical Exam  Constitutional: She appears well-developed and well-nourished. No distress.  HENT:  Head: Normocephalic and atraumatic.  Cardiovascular: Normal rate and regular rhythm.  Murmur (mild systolic) heard. Pulmonary/Chest: Effort normal and breath sounds normal. No respiratory distress. She has no wheezes. She has no rales.  Coarse at bases  Musculoskeletal: She exhibits no edema.  Point tender to palpation along L rhomboids No midline thoracic spine tenderness  Skin: Rash noted. No erythema.  Flesh colored papules at R>L eyelids  Nursing note and vitals reviewed.     Assessment & Plan:   Problem List Items Addressed This Visit    Thoracic back pain    Anticipate discogenic. Discussed heating pad, topical treatment. Never rash to suggest shingles. Pt does not tolerate oral prednisone.       Skin rash    Isolated to below eyelids. Has seen ophtho and receiving treatment with eye drops.       Fibromyalgia (Chronic)    Back on lyrica 150mg  bid. Bad reaction to trial gabapentin. Working to afford medication.       Relevant Medications   pregabalin (LYRICA) 150 MG capsule   DNR (do not resuscitate)    Reviewed with patient. Desires this. DNR order form filled out and provided to patient      Chronic pain syndrome (Chronic)    We will take over lyrica prescription per patient request. Currently on 150mg  BID.  Refilled today.       Adverse drug reaction - Primary    To gabapentin, now off this. Unable to take prednisone. Will continue hydroxyzine for itching and pepcid - pt experiences benefit from both.           Meds ordered this encounter  Medications  . hydrOXYzine (ATARAX/VISTARIL) 25 MG tablet    Sig: Take 1 tablet (25 mg total) by mouth 2 (two) times daily as needed for itching.    Dispense:  30 tablet    Refill:  0  . pregabalin (LYRICA) 150 MG capsule    Sig: Take  1 capsule (150 mg total) by mouth 2 (two) times daily.    Dispense:  60 capsule    Refill:  6  . famotidine (PEPCID) 20 MG tablet    Sig: Take 1 tablet (20 mg total) by mouth 2 (two) times daily as needed for heartburn or indigestion.    Dispense:  60 tablet    Refill:  0   No orders of the defined types were placed in this encounter.   Follow up plan: Return if symptoms worsen or fail to improve.  Ria Bush, MD

## 2017-10-23 NOTE — Assessment & Plan Note (Addendum)
Back on lyrica 150mg  bid. Bad reaction to trial gabapentin. Working to afford medication.

## 2017-10-23 NOTE — Assessment & Plan Note (Signed)
Anticipate discogenic. Discussed heating pad, topical treatment. Never rash to suggest shingles. Pt does not tolerate oral prednisone.

## 2017-10-23 NOTE — Assessment & Plan Note (Signed)
To gabapentin, now off this. Unable to take prednisone. Will continue hydroxyzine for itching and pepcid - pt experiences benefit from both.

## 2017-10-23 NOTE — Patient Instructions (Addendum)
I've refilled lyrica 150mg  twice daily 30 days at a time.  I've refilled hydroxyzine DNR form filled out today.  For thoracic back pain - try heating pad to the back.  Keep next month's appointment.

## 2017-10-23 NOTE — Assessment & Plan Note (Signed)
Reviewed with patient. Desires this. DNR order form filled out and provided to patient

## 2017-11-04 ENCOUNTER — Other Ambulatory Visit: Payer: Self-pay | Admitting: Family Medicine

## 2017-11-04 DIAGNOSIS — R740 Nonspecific elevation of levels of transaminase and lactic acid dehydrogenase [LDH]: Principal | ICD-10-CM

## 2017-11-04 DIAGNOSIS — R7303 Prediabetes: Secondary | ICD-10-CM

## 2017-11-04 DIAGNOSIS — E785 Hyperlipidemia, unspecified: Secondary | ICD-10-CM

## 2017-11-04 DIAGNOSIS — E039 Hypothyroidism, unspecified: Secondary | ICD-10-CM

## 2017-11-04 DIAGNOSIS — R7401 Elevation of levels of liver transaminase levels: Secondary | ICD-10-CM

## 2017-11-06 ENCOUNTER — Ambulatory Visit (INDEPENDENT_AMBULATORY_CARE_PROVIDER_SITE_OTHER): Payer: Medicare Other

## 2017-11-06 VITALS — BP 110/70 | HR 64 | Temp 97.9°F | Ht 58.5 in | Wt 139.5 lb

## 2017-11-06 DIAGNOSIS — E039 Hypothyroidism, unspecified: Secondary | ICD-10-CM

## 2017-11-06 DIAGNOSIS — R740 Nonspecific elevation of levels of transaminase and lactic acid dehydrogenase [LDH]: Secondary | ICD-10-CM | POA: Diagnosis not present

## 2017-11-06 DIAGNOSIS — R7303 Prediabetes: Secondary | ICD-10-CM | POA: Diagnosis not present

## 2017-11-06 DIAGNOSIS — R7401 Elevation of levels of liver transaminase levels: Secondary | ICD-10-CM

## 2017-11-06 DIAGNOSIS — E785 Hyperlipidemia, unspecified: Secondary | ICD-10-CM | POA: Diagnosis not present

## 2017-11-06 DIAGNOSIS — Z0000 Encounter for general adult medical examination without abnormal findings: Secondary | ICD-10-CM | POA: Diagnosis not present

## 2017-11-06 LAB — T4, FREE: Free T4: 1.21 ng/dL (ref 0.60–1.60)

## 2017-11-06 LAB — IBC PANEL
Iron: 92 ug/dL (ref 42–145)
Saturation Ratios: 23.4 % (ref 20.0–50.0)
Transferrin: 281 mg/dL (ref 212.0–360.0)

## 2017-11-06 LAB — COMPREHENSIVE METABOLIC PANEL
ALT: 16 U/L (ref 0–35)
AST: 166 U/L — ABNORMAL HIGH (ref 0–37)
Albumin: 4.4 g/dL (ref 3.5–5.2)
Alkaline Phosphatase: 43 U/L (ref 39–117)
BUN: 12 mg/dL (ref 6–23)
CO2: 31 mEq/L (ref 19–32)
Calcium: 9.9 mg/dL (ref 8.4–10.5)
Chloride: 101 mEq/L (ref 96–112)
Creatinine, Ser: 0.75 mg/dL (ref 0.40–1.20)
GFR: 78.76 mL/min (ref >60.00)
Glucose, Bld: 114 mg/dL — ABNORMAL HIGH (ref 70–99)
Potassium: 3.9 mEq/L (ref 3.5–5.1)
Sodium: 140 mEq/L (ref 135–145)
Total Bilirubin: 0.6 mg/dL (ref 0.2–1.2)
Total Protein: 6.9 g/dL (ref 6.0–8.3)

## 2017-11-06 LAB — LIPID PANEL
CHOL/HDL RATIO: 3
Cholesterol: 191 mg/dL (ref 0–200)
HDL: 63.6 mg/dL (ref >39.00)
LDL CALC: 101 mg/dL — AB (ref 0–99)
NonHDL: 126.92
TRIGLYCERIDES: 129 mg/dL (ref 0.0–149.0)
VLDL: 25.8 mg/dL (ref 0.0–40.0)

## 2017-11-06 LAB — TSH: TSH: 0.39 u[IU]/mL (ref 0.35–4.50)

## 2017-11-06 LAB — HEMOGLOBIN A1C: Hgb A1c MFr Bld: 6.1 % (ref 4.6–6.5)

## 2017-11-06 NOTE — Progress Notes (Signed)
Subjective:   Cheyenne Pierce is a 83 y.o. female who presents for Medicare Annual (Subsequent) preventive examination.  Review of Systems:  N/A Cardiac Risk Factors include: advanced age (>63men, >11 women);dyslipidemia     Objective:     Vitals: BP 110/70 (BP Location: Right Arm, Patient Position: Sitting, Cuff Size: Normal)   Pulse 64   Temp 97.9 F (36.6 C) (Oral)   Ht 4' 10.5" (1.486 m) Comment: no shoes  Wt 139 lb 8 oz (63.3 kg)   SpO2 95%   BMI 28.66 kg/m   Body mass index is 28.66 kg/m.  Advanced Directives 11/06/2017 04/28/2017 01/29/2017 10/28/2016 10/07/2016 08/18/2016 07/30/2016  Does Patient Have a Medical Advance Directive? Yes Yes Yes Yes Yes Yes Yes  Type of Paramedic of [place];Living will [place];Living will [place];Living will [place];Living will Living will Living will -  Does patient want to make changes to medical advance directive? - No - Patient declined No - Patient declined - - - -  Copy of [place] in Chart? Yes Yes No - copy requested - - - -    Tobacco Social History   Tobacco Use  Smoking Status Former Smoker  . Packs/day: 1.00  . Years: 30.00  . Pack years: 30.00  Smokeless Tobacco Never Used  Tobacco Comment   quit smoking in 1980     Counseling given: No Comment: quit smoking in 1980   Clinical Intake:  Pre-visit preparation completed: Yes  Pain Score: 6 (lower back, left shoulder blade)     Nutritional Status: BMI 25 -29 Overweight Nutritional Risks: None Diabetes: No  How often do you need to have someone help you when you read instructions, pamphlets, or other written materials from your doctor or pharmacy?: 1 - Never What is the last grade level you completed in school?: 12th grade + 2 yrs college  Interpreter Needed?: No  Comments: pt lives with spouse Information entered by :: LPinson, LPN  Past Medical  History:  Diagnosis Date  . Allergy   . Angina pectoris ([place])   . Anxiety    takes Wellbutrin daily  . Cancer ([place])    basal cell right upper arm; dx and treated 04/2017  . Cataract    left eye,immature  . Chronic back pain    scoliosis/DDD  . DDD (degenerative disc disease), cervical    moderate to severe multilevel cervical spine DDD, most severe central stenosis C5/6, significant R foraminal stenosis ([name])  . DDD (degenerative disc disease), cervical    throughout spine   . DDD (degenerative disc disease), lumbar    multilevel degenerative changes mostly L3/4 with mod spinal canal and mild bilat neural foraminal stenosis, moderate R neural froaminal narrowing L4/5 ([name])  . Dental bridge present   . Depression   . Diverticulosis   . GERD (gastroesophageal reflux disease)    takes Protonix daily  . Headache   . Heart murmur   . History of bronchitis    last time Spring 2016  . History of colon polyps    benign  . History of gastric ulcer   . History of hiatal hernia   . Hypertension    takes Amlodipine and Maxzide daily  . Hypothyroidism    takes SYnthroid daily  . IBS (irritable bowel syndrome)    diarrhea-takes Immodium as needed  . Insomnia    takes Benadryl nightly  . Joint pain   .  Joint swelling   . Mass of vagina    referral to [facility] 03/2014  . Nocturia   . Peripheral neuropathy    in feet, ESI have helped [name])  . Peripheral neuropathy    takes Gabapentin daily  . Urinary frequency   . Urinary urgency   . Vertigo    takes Antivert as needed   Past Surgical History:  Procedure Laterality Date  . ABDOMINAL HYSTERECTOMY  1968   left ovary remains  . ANTERIOR CERVICAL DECOMPRESSION/DISCECTOMY FUSION 4 LEVELS N/A 06/05/2015   Procedure: ANTERIOR CERVICAL DECOMPRESSION/DISCECTOMY FUSION CERVICAL THREE-FOUR,CERVICAL FOUR-FIVE,CERVICAL FIVE-SIX,CERVICAL SIX-SEVEN;  Surgeon: Earnie Larsson, MD;  Location: [facility];  Service:  Neurosurgery;  Laterality: N/A;  right side approach  . APPENDECTOMY  1948  . bladder suspension with vaginal sling    . CARDIAC CATHETERIZATION  09/26/1998  . CATARACT EXTRACTION W/PHACO Left 10/15/2015   Procedure: CATARACT EXTRACTION PHACO AND INTRAOCULAR LENS PLACEMENT (IOC) LEFT EYE;  Surgeon: Ronnell Freshwater, MD;  Location: [facility];  Service: Ophthalmology;  Laterality: Left;  LEFT LEAVE PT LAST  . CATARACT EXTRACTION W/PHACO Right 11/26/2015   Procedure: CATARACT EXTRACTION PHACO AND INTRAOCULAR LENS PLACEMENT (IOC);  Surgeon: Ronnell Freshwater, MD;  Location: [facility];  Service: Ophthalmology;  Laterality: Right;  RIGHT  . COLONOSCOPY  07/2009   [facility]), normal per pt  . COLONOSCOPY  06/2014   int hem, rpt 5 yrs ([name])  . COLOSTOMY W/ RECTOCELE REPAIR  06/2004  . DEXA  2013   T -1.0  . epidural infections    . epidural steroid injections  multiple latest 05/2014, 07/2014   help periph neuropathy [name])  . ESOPHAGOGASTRODUODENOSCOPY    . EYE SURGERY Left 10/15/2015   cataract removal  . HERNIA REPAIR    . NM MYOVIEW LTD  2009   WNL per report  . OVARIAN CYST SURGERY  1963   cyst on ovaries  . TONSILLECTOMY    . US ECHOCARDIOGRAPHY  2009   mild aortic/mitral insuff, ER 43%, mild diastolic dysfunction  . VENTRAL HERNIA REPAIR  08/2008   abdominal wall, lysis of adhesions   Family History  Problem Relation Age of Onset  . Heart disease Mother   . Stroke Brother        2 strokes  . Hypertension Neg Hx   . Diabetes Neg Hx   . Cancer Neg Hx        breast or colon cancer, no history   Social History   Socioeconomic History  . Marital status: Widowed    Spouse name: Not on file  . Number of children: 5  . Years of education: Not on file  . Highest education level: Not on file  Occupational History  . Not on file  Social Needs  . Financial resource strain: Not on file  . Food insecurity:    Worry: Not on file     Inability: Not on file  . Transportation needs:    Medical: Not on file    Non-medical: Not on file  Tobacco Use  . Smoking status: Former Smoker    Packs/day: 1.00    Years: 30.00    Pack years: 30.00  . Smokeless tobacco: Never Used  . Tobacco comment: quit smoking in 1980  Substance and Sexual Activity  . Alcohol use: No    Alcohol/week: 0.0 oz  . Drug use: No  . Sexual activity: Not on file  Lifestyle  . Physical activity:  Days per week: Not on file    Minutes per session: Not on file  . Stress: Not on file  Relationships  . Social connections:    Talks on phone: Not on file    Gets together: Not on file    Attends religious service: Not on file    Active member of club or organization: Not on file    Attends meetings of clubs or organizations: Not on file    Relationship status: Not on file  Other Topics Concern  . Not on file  Social History Narrative   Daily caffeine use 6 cups every day   Lives with partner, Mr Lurline Hare children nearby   Activity: no regular exercise   Diet: good water, fruits daily, some vegetables    Outpatient Encounter Medications as of 11/06/2017  Medication Sig  . acetaminophen (TYLENOL) 500 MG tablet Take 500 mg by mouth every 6 (six) hours as needed.  Marland Kitchen amLODipine (NORVASC) 2.5 MG tablet Take 1 tablet (2.5 mg total) by mouth daily.  Marland Kitchen aspirin EC 81 MG tablet Take 81 mg by mouth as needed.  . ASTRAGALUS PO Take 500 mg by mouth daily.  . benzonatate (TESSALON) 100 MG capsule take 1 capsule by mouth twice a day if needed for cough  . Biotin 1000 MCG tablet Take 1,000 mcg by mouth daily.  . calcium-vitamin D (OSCAL WITH D) 500-200 MG-UNIT tablet Take 1 tablet by mouth.  . diphenhydrAMINE (BENADRYL) 25 mg capsule Take 25 mg by mouth at bedtime.   . famotidine (PEPCID) 20 MG tablet Take 1 tablet (20 mg total) by mouth 2 (two) times daily as needed for heartburn or indigestion.  . hydrOXYzine (ATARAX/VISTARIL) 25 MG tablet Take 1 tablet  (25 mg total) by mouth 2 (two) times daily as needed for itching.  . hyoscyamine (LEVSIN, ANASPAZ) 0.125 MG tablet Take 1 tablet (0.125 mg total) by mouth 2 (two) times daily as needed.  Marland Kitchen levothyroxine (SYNTHROID, LEVOTHROID) 75 MCG tablet Take 1 tablet (75 mcg total) by mouth daily.  Marland Kitchen loperamide (IMODIUM) 2 MG capsule Take by mouth as needed for diarrhea or loose stools.  . Magnesium 250 MG TABS Take by mouth daily.  . meclizine (ANTIVERT) 25 MG tablet take 1 tablet by mouth three times a day if needed  . nitroGLYCERIN (NITROSTAT) 0.4 MG SL tablet Place 1 tablet (0.4 mg total) under the tongue every 5 (five) minutes as needed.  . pantoprazole (PROTONIX) 40 MG tablet Take 1 tablet (40 mg total) by mouth daily.  . pregabalin (LYRICA) 150 MG capsule Take 1 capsule (150 mg total) by mouth 2 (two) times daily.  Marland Kitchen triamterene-hydrochlorothiazide (MAXZIDE-25) 37.5-25 MG tablet TAKE 1 TABLET BY MOUTH ONCE DAILY   No facility-administered encounter medications on file as of 11/06/2017.     Activities of Daily Living In your present state of health, do you have any difficulty performing the following activities: 11/06/2017  Hearing? N  Vision? N  Difficulty concentrating or making decisions? N  Walking or climbing stairs? N  Dressing or bathing? N  Doing errands, shopping? N  Preparing Food and eating ? N  Using the Toilet? N  In the past six months, have you accidently leaked urine? N  Do you have problems with loss of bowel control? N  Managing your Medications? N  Managing your Finances? N  Housekeeping or managing your Housekeeping? N  Some recent data might be hidden    Patient Care Team: Danise Mina,  Garlon Hatchet, MD as PCP - General (Family Medicine) Vin-Parikh, Deirdre Peer, MD as Consulting Physician (Ophthalmology) Milinda Pointer, MD as Consulting Physician (Pain Medicine)    Assessment:   This is a routine wellness examination for Sugey.   Hearing Screening   125Hz  250Hz  500Hz   1000Hz  2000Hz  3000Hz  4000Hz  6000Hz  8000Hz   Right ear:   40 40 40  40    Left ear:   40 40 40  40    Vision Screening Comments: Vision exam in July 2019 with Dr. Annamaria Helling  Exercise Activities and Dietary recommendations Current Exercise Habits: The patient does not participate in regular exercise at present, Exercise limited by: None identified  Goals    . DIET - INCREASE WATER INTAKE     Starting 11/06/2017, I will continue to drink 4-5 glasses of water daily.        Fall Risk Fall Risk  11/06/2017 04/28/2017 01/29/2017 10/28/2016 10/07/2016  Falls in the past year? No No No No No  Number falls in past yr: - - - - -  Injury with Fall? - - - - -  Comment - - - - -  Risk for fall due to : - - - - -  Risk for fall due to: Comment - - - - -  Follow up - - - - -  Comment - - - - -   Depression Screen PHQ 2/9 Scores 11/06/2017 04/28/2017 01/29/2017 10/28/2016  PHQ - 2 Score 0 0 0 1  PHQ- 9 Score 0 - - -  Exception Documentation - - - -     Cognitive Function MMSE - Mini Mental State Exam 11/06/2017 10/28/2016  Orientation to time 5 4  Orientation to Place 5 5  Registration 3 3  Attention/ Calculation 0 0  Recall 3 3  Language- name 2 objects 0 0  Language- repeat 1 1  Language- follow 3 step command 2 3  Language- follow 3 step command-comments unable to follow 1 step of 3 step command -  Language- read & follow direction 0 0  Write a sentence 0 0  Copy design 0 0  Total score 19 19     PLEASE NOTE: A Mini-Cog screen was completed. Maximum score is 20. A value of 0 denotes this part of [place] MMSE was not completed or the patient failed this part of the Mini-Cog screening.   Mini-Cog Screening Orientation to Time - Max 5 pts Orientation to Place - Max 5 pts Registration - Max 3 pts Recall - Max 3 pts Language Repeat - Max 1 pts Language Follow 3 Step Command - Max 3 pts    Immunization History  Administered Date(s) Administered  . Influenza Split 12/31/2010, 01/01/2012  .  Influenza Whole 01/07/2007, 01/03/2008, 01/04/2009, 12/25/2009  . Influenza,inj,Quad PF,6+ Mos 12/30/2012, 12/27/2013, 12/26/2014, 01/04/2016, 01/21/2017  . Pneumococcal Conjugate-13 10/11/2013  . Pneumococcal Polysaccharide-23 12/05/2004  . Td 05/05/2005  . Zoster 07/30/2011    Screening Tests Health Maintenance  Topic Date Due  . INFLUENZA VACCINE  07/06/2018 (Originally 11/05/2017)  . DTaP/Tdap/Td (1 - Tdap) 11/07/2018 (Originally 05/06/2005)  . TETANUS/TDAP  11/07/2018 (Originally 05/06/2015)  . MAMMOGRAM  11/06/2048 (Originally 12/13/2016)  . DEXA SCAN  Completed  . PNA vac Low Risk Adult  Completed       Plan:     I have personally reviewed, addressed, and noted the following in the patient's chart:  A. Medical and social history B. Use of alcohol, tobacco or illicit drugs  C. Current  medications and supplements D. Functional ability and status E.  Nutritional status F.  Physical activity G. Advance directives H. List of other physicians I.  Hospitalizations, surgeries, and ER visits in previous 12 months J.  [place] to include hearing, vision, cognitive, depression L. Referrals and appointments - none  In addition, I have reviewed and discussed with patient certain preventive protocols, quality metrics, and best practice recommendations. A written personalized care plan for preventive services as well as general preventive health recommendations were provided to patient.  See attached scanned questionnaire for additional information.   Signed,   Lindell Noe, MHA, BS, LPN Health Coach

## 2017-11-06 NOTE — Progress Notes (Signed)
PCP notes:   Health maintenance:  Flu vaccine - addressed Tetanus vaccine - pt declined Mammogram - pt declined  Abnormal screenings:   Mini-Cog score: 19/20 MMSE - Mini Mental State Exam 11/06/2017 10/28/2016  Orientation to time 5 4  Orientation to Place 5 5  Registration 3 3  Attention/ Calculation 0 0  Recall 3 3  Language- name 2 objects 0 0  Language- repeat 1 1  Language- follow 3 step command 2 3  Language- follow 3 step command-comments unable to follow 1 step of 3 step command -  Language- read & follow direction 0 0  Write a sentence 0 0  Copy design 0 0  Total score 19 19    Patient concerns:   Chronic lower back and left shoulder blade pain  Nurse concerns:  None  Next PCP appt:   11/16/17 @ 1500

## 2017-11-06 NOTE — Patient Instructions (Signed)
Cheyenne Pierce , Thank you for taking time to come for your Medicare Wellness Visit. I appreciate your ongoing commitment to your health goals. Please review the following plan we discussed and let me know if I can assist you in the future.   These are the goals we discussed: Goals    . DIET - INCREASE WATER INTAKE     Starting 11/06/2017, I will continue to drink 4-5 glasses of water daily.        This is a list of the screening recommended for you and due dates:  Health Maintenance  Topic Date Due  . Flu Shot  07/06/2018*  . DTaP/Tdap/Td vaccine (1 - Tdap) 11/07/2018*  . Tetanus Vaccine  11/07/2018*  . Mammogram  11/06/2048*  . DEXA scan (bone density measurement)  Completed  . Pneumonia vaccines  Completed  *Topic was postponed. The date shown is not the original due date.   Preventive Care for Adults  A healthy lifestyle and preventive care can promote health and wellness. Preventive health guidelines for adults include the following key practices.  . A routine yearly physical is a good way to check with your health care provider about your health and preventive screening. It is a chance to share any concerns and updates on your health and to receive a thorough exam.  . Visit your dentist for a routine exam and preventive care every 6 months. Brush your teeth twice a day and floss once a day. Good oral hygiene prevents tooth decay and gum disease.  . The frequency of eye exams is based on your age, health, family medical history, use  of contact lenses, and other factors. Follow your health care provider's recommendations for frequency of eye exams.  . Eat a healthy diet. Foods like vegetables, fruits, whole grains, low-fat dairy products, and lean protein foods contain the nutrients you need without too many calories. Decrease your intake of foods high in solid fats, added sugars, and salt. Eat the right amount of calories for you. Get information about a proper diet from your health  care provider, if necessary.  . Regular physical exercise is one of the most important things you can do for your health. Most adults should get at least 150 minutes of moderate-intensity exercise (any activity that increases your heart rate and causes you to sweat) each week. In addition, most adults need muscle-strengthening exercises on 2 or more days a week.  Silver Sneakers may be a benefit available to you. To determine eligibility, you may visit the website: www.silversneakers.com or contact program at [phone] Mon-Fri between 8AM-8PM.   . Maintain a healthy weight. The body mass index (BMI) is a screening tool to identify possible weight problems. It provides an estimate of body fat based on height and weight. Your health care provider can find your BMI and can help you achieve or maintain a healthy weight.   For adults 20 years and older: ? A BMI below 18.5 is considered underweight. ? A BMI of 18.5 to 24.9 is normal. ? A BMI of 25 to 29.9 is considered overweight. ? A BMI of 30 and above is considered obese.   . Maintain normal blood lipids and cholesterol levels by exercising and minimizing your intake of saturated fat. Eat a balanced diet with plenty of fruit and vegetables. Blood tests for lipids and cholesterol should begin at age 59 and be repeated every 5 years. If your lipid or cholesterol levels are high, you are over 50, or you  are at high risk for heart disease, you may need your cholesterol levels checked more frequently. Ongoing high lipid and cholesterol levels should be treated with medicines if diet and exercise are not working.  . If you smoke, find out from your health care provider how to quit. If you do not use tobacco, please do not start.  . If you choose to drink alcohol, please do not consume more than 2 drinks per day. One drink is considered to be 12 ounces (355 mL) of beer, 5 ounces (148 mL) of wine, or 1.5 ounces (44 mL) of liquor.  . If you are 74-51  years old, ask your health care provider if you should take aspirin to prevent strokes.  . Use sunscreen. Apply sunscreen liberally and repeatedly throughout the day. You should seek shade when your shadow is shorter than you. Protect yourself by wearing long sleeves, pants, a wide-brimmed hat, and sunglasses year round, whenever you are outdoors.  . Once a month, do a whole body skin exam, using a mirror to look at the skin on your back. Tell your health care provider of new moles, moles that have irregular borders, moles that are larger than a pencil eraser, or moles that have changed in shape or color.

## 2017-11-09 NOTE — Progress Notes (Signed)
I reviewed health advisor's note, was available for consultation, and agree with documentation and plan.  

## 2017-11-13 ENCOUNTER — Encounter: Payer: Medicare Other | Admitting: Family Medicine

## 2017-11-16 ENCOUNTER — Ambulatory Visit (INDEPENDENT_AMBULATORY_CARE_PROVIDER_SITE_OTHER): Payer: Medicare Other | Admitting: Family Medicine

## 2017-11-16 ENCOUNTER — Encounter: Payer: Self-pay | Admitting: Family Medicine

## 2017-11-16 VITALS — BP 120/70 | HR 67 | Temp 98.2°F | Ht 58.5 in | Wt 141.0 lb

## 2017-11-16 DIAGNOSIS — G609 Hereditary and idiopathic neuropathy, unspecified: Secondary | ICD-10-CM

## 2017-11-16 DIAGNOSIS — G894 Chronic pain syndrome: Secondary | ICD-10-CM

## 2017-11-16 DIAGNOSIS — R7303 Prediabetes: Secondary | ICD-10-CM | POA: Diagnosis not present

## 2017-11-16 DIAGNOSIS — R740 Nonspecific elevation of levels of transaminase and lactic acid dehydrogenase [LDH]: Secondary | ICD-10-CM | POA: Diagnosis not present

## 2017-11-16 DIAGNOSIS — R7401 Elevation of levels of liver transaminase levels: Secondary | ICD-10-CM

## 2017-11-16 DIAGNOSIS — Z0000 Encounter for general adult medical examination without abnormal findings: Secondary | ICD-10-CM

## 2017-11-16 DIAGNOSIS — M8580 Other specified disorders of bone density and structure, unspecified site: Secondary | ICD-10-CM

## 2017-11-16 DIAGNOSIS — Z7189 Other specified counseling: Secondary | ICD-10-CM | POA: Diagnosis not present

## 2017-11-16 DIAGNOSIS — E039 Hypothyroidism, unspecified: Secondary | ICD-10-CM

## 2017-11-16 DIAGNOSIS — E785 Hyperlipidemia, unspecified: Secondary | ICD-10-CM

## 2017-11-16 DIAGNOSIS — I10 Essential (primary) hypertension: Secondary | ICD-10-CM

## 2017-11-16 MED ORDER — HYDROXYZINE HCL 25 MG PO TABS: 25.0000 mg | ORAL_TABLET | Freq: Two times a day (BID) | ORAL | 0 refills | Status: AC | PRN

## 2017-11-16 NOTE — Assessment & Plan Note (Signed)
Advanced directives: scanned and in chart 10/2015. HCPOA is daughter then sons. Does not want prolonged measures.

## 2017-11-16 NOTE — Progress Notes (Signed)
BP 120/70 (BP Location: Left Arm, Patient Position: Sitting, Cuff Size: Normal)   Pulse 67   Temp 98.2 F (36.8 C) (Oral)   Ht 4' 10.5" (1.486 m)   Wt 141 lb (64 kg)   SpO2 97%   BMI 28.97 kg/m    CC: CPE Subjective:    Patient ID: Cheyenne Pierce, female    DOB: [birth year], 83 y.o.   MRN: [id number]  HPI: DONNICA JARNAGIN is a 83 y.o. female presenting on 11/16/2017 for Annual Exam (Pt 2.  Pt accompanied by her husband.)   Annia Belt last week for medicare wellness visit. Note reviewed.  Saw Jefm Bryant [facility]) - rec return to see Dr [name]. Pending lumbar MRI. ?rpt ESI Requests hydroxyzine refilled for itching - attributed to prurigo from nerves.   Preventative: COLONOSCOPY Date: 06/2014 int hem, rpt 5 yrs [facility]) Well woman with OBGYN - 2016 [facility] ([place]). Breast exam with OBGYN.  Mammogram - 12/2015 normal per patient at [facility] [facility]. No f/u mammo since.  Dexa - 11/2011 osteopenia T-1.9 hip. Notes shrinking. Will reorder D[facility]A scan at [facility] Flu yearly Pneumovax 2006. prevnar 2015 Td 2007  zostavax 07/2011  shingrix - discussed  Advanced directives: scanned and in chart 10/2015. HCPOA is daughter then sons. Does not want prolonged measures.  Seat belt use discussed Sunscreen use discussed, no changing moles. Saw derm Dr Evorn Gong.  Ex smoker quit 1980 Alcohol - none Dentist - Q30mo Eye exam - yearly  Daily caffeine use 6 cups every day  Lives with husband  Grown children nearby  Activity: no regular exercise - limited by back pain Diet: good water, fruits daily, some vegetables, avoid fried foods  Relevant past medical, surgical, family and social history reviewed and updated as indicated. Interim medical history since our last visit reviewed. Allergies and medications reviewed and updated. Outpatient Medications Prior to Visit  Medication Sig Dispense Refill  . acetaminophen (TYLENOL) 500 MG tablet Take 500 mg by mouth every 6 (six) hours as  needed.    Marland Kitchen amLODipine (NORVASC) 2.5 MG tablet Take 1 tablet (2.5 mg total) by mouth daily. 90 tablet 1  . aspirin EC 81 MG tablet Take 81 mg by mouth as needed.    . ASTRAGALUS PO Take 500 mg by mouth daily.    . benzonatate (TESSALON) 100 MG capsule take 1 capsule by mouth twice a day if needed for cough 30 capsule 0  . Biotin 1000 MCG tablet Take 1,000 mcg by mouth daily.    . calcium-vitamin D (OSCAL WITH D) 500-200 MG-UNIT tablet Take 1 tablet by mouth.    . diphenhydrAMINE (BENADRYL) 25 mg capsule Take 25 mg by mouth at bedtime.     . famotidine (PEPCID) 20 MG tablet Take 1 tablet (20 mg total) by mouth 2 (two) times daily as needed for heartburn or indigestion. 60 tablet 0  . hyoscyamine (LEVSIN, ANASPAZ) 0.125 MG tablet Take 1 tablet (0.125 mg total) by mouth 2 (two) times daily as needed. 30 tablet 1  . levothyroxine (SYNTHROID, LEVOTHROID) 75 MCG tablet Take 1 tablet (75 mcg total) by mouth daily. 30 tablet 5  . loperamide (IMODIUM) 2 MG capsule Take by mouth as needed for diarrhea or loose stools.    . Magnesium 250 MG TABS Take by mouth daily.    . meclizine (ANTIVERT) 25 MG tablet take 1 tablet by mouth three times a day if needed 30 tablet 1  . nitroGLYCERIN (NITROSTAT) 0.4 MG SL  tablet Place 1 tablet (0.4 mg total) under the tongue every 5 (five) minutes as needed. 25 tablet 2  . pantoprazole (PROTONIX) 40 MG tablet Take 1 tablet (40 mg total) by mouth daily. 30 tablet 11  . pregabalin (LYRICA) 150 MG capsule Take 1 capsule (150 mg total) by mouth 2 (two) times daily. 60 capsule 6  . triamterene-hydrochlorothiazide (MAXZIDE-25) 37.5-25 MG tablet TAKE 1 TABLET BY MOUTH ONCE DAILY 30 tablet 11  . hydrOXYzine (ATARAX/VISTARIL) 25 MG tablet Take 1 tablet (25 mg total) by mouth 2 (two) times daily as needed for itching. 30 tablet 0   No facility-administered medications prior to visit.      Per HPI unless specifically indicated in ROS section below Review of Systems    Constitutional: Negative for activity change, appetite change, chills, fatigue, fever and unexpected weight change.  HENT: Negative for hearing loss.   Eyes: Negative for visual disturbance.  Respiratory: Negative for cough, chest tightness, shortness of breath and wheezing.   Cardiovascular: Positive for chest pain (intermittent, s/p cardiac evaluation). Negative for palpitations and leg swelling.  Gastrointestinal: Positive for diarrhea (manages with immodium). Negative for abdominal distention, abdominal pain, blood in stool, constipation, nausea and vomiting.  Genitourinary: Negative for difficulty urinating and hematuria.  Musculoskeletal: Negative for arthralgias, myalgias and neck pain.  Skin: Negative for rash.  Neurological: Positive for dizziness (treated with meclizine). Negative for seizures, syncope and headaches.  Hematological: Negative for adenopathy. Does not bruise/bleed easily.  Psychiatric/Behavioral: Negative for dysphoric mood. The patient is not nervous/anxious.        Objective:    BP 120/70 (BP Location: Left Arm, Patient Position: Sitting, Cuff Size: Normal)   Pulse 67   Temp 98.2 F (36.8 C) (Oral)   Ht 4' 10.5" (1.486 m)   Wt 141 lb (64 kg)   SpO2 97%   BMI 28.97 kg/m   Wt Readings from Last 3 Encounters:  11/16/17 141 lb (64 kg)  11/06/17 139 lb 8 oz (63.3 kg)  10/23/17 141 lb 12 oz (64.3 kg)    Physical Exam  Constitutional: She is oriented to person, place, and time. She appears well-developed and well-nourished. No distress.  HENT:  Head: Normocephalic and atraumatic.  Right Ear: Hearing, tympanic membrane, external ear and ear canal normal.  Left Ear: Hearing, tympanic membrane, external ear and ear canal normal.  Nose: Nose normal.  Mouth/Throat: Uvula is midline, oropharynx is clear and moist and mucous membranes are normal. No oropharyngeal exudate, posterior oropharyngeal edema or posterior oropharyngeal erythema.  Eyes: Pupils are equal,  round, and reactive to light. Conjunctivae and EOM are normal. No scleral icterus.  Neck: Normal range of motion. Neck supple. Carotid bruit is not present. No thyromegaly present.  Cardiovascular: Normal rate, regular rhythm, normal heart sounds and intact distal pulses.  No murmur heard. Pulses:      Radial pulses are 2+ on the right side, and 2+ on the left side.  Pulmonary/Chest: Effort normal and breath sounds normal. No respiratory distress. She has no wheezes. She has no rales.  Abdominal: Soft. Bowel sounds are normal. She exhibits no distension and no mass. There is no tenderness. There is no rebound and no guarding.  Musculoskeletal: Normal range of motion. She exhibits no edema.  Lymphadenopathy:    She has no cervical adenopathy.  Neurological: She is alert and oriented to person, place, and time.  CN grossly intact, station and gait intact  Skin: Skin is warm and dry. No rash noted.  Psychiatric: She has a normal mood and affect. Her behavior is normal. Judgment and thought content normal.  Nursing note and vitals reviewed.  Results for orders placed or performed in visit on 11/06/17  T4, free  Result Value Ref Range   Free T4 1.21 0.60 - 1.60 ng/dL  IBC panel  Result Value Ref Range   Iron 92 42 - 145 ug/dL   Transferrin 281.0 212.0 - 360.0 mg/dL   Saturation Ratios 23.4 20.0 - 50.0 %  TSH  Result Value Ref Range   TSH 0.39 0.35 - 4.50 uIU/mL  Hemoglobin A1c  Result Value Ref Range   Hgb A1c MFr Bld 6.1 4.6 - 6.5 %  Comprehensive metabolic panel  Result Value Ref Range   Sodium 140 135 - 145 mEq/L   Potassium 3.9 3.5 - 5.1 mEq/L   Chloride 101 96 - 112 mEq/L   CO2 31 19 - 32 mEq/L   Glucose, Bld 114 (H) 70 - 99 mg/dL   BUN 12 6 - 23 mg/dL   Creatinine, Ser 0.75 0.40 - 1.20 mg/dL   Total Bilirubin 0.6 0.2 - 1.2 mg/dL   Alkaline Phosphatase 43 39 - 117 U/L   AST 166 (H) 0 - 37 U/L   ALT 16 0 - 35 U/L   Total Protein 6.9 6.0 - 8.3 g/dL   Albumin 4.4 3.5 - 5.2  g/dL   Calcium 9.9 8.4 - 10.5 mg/dL   GFR 78.76 >60.00 mL/min  Lipid panel  Result Value Ref Range   Cholesterol 191 0 - 200 mg/dL   Triglycerides 129.0 0.0 - 149.0 mg/dL   HDL 63.60 >39.00 mg/dL   VLDL 25.8 0.0 - 40.0 mg/dL   LDL Cholesterol 101 (H) 0 - 99 mg/dL   Total CHOL/HDL Ratio 3    NonHDL 126.92       Assessment & Plan:   Problem List Items Addressed This Visit    Transaminitis    Persistently elevated over this past year. Recent abd US showed gallstones otherwise normal (06/2017). Denies alcohol use. Denies abdominal pain.  Consider repeat testing - GGT, ceruloplasmin, viral hepatitis panel. ?Macro-AST      Relevant Orders   Hepatic function panel   CBC with Differential/Platelet   Ceruloplasmin   Hepatitis panel, acute   CK   ANA   Anti-Smooth Muscle Antibody, IGG   Alpha-1-Antitrypsin   Prediabetes    Reviewed recent labs with patient.       Osteopenia    Latest dexa 2013 with T score -1.9. Pt endorses new shrinking size - will update DEXA at [facility].      Relevant Orders   DG Bone Density   Hypothyroidism    Chronic, stable. Continue levothyroxine. TFTs stable.       HLD (hyperlipidemia)    Chronic, stable off cholesterol medication.      Hereditary and idiopathic peripheral neuropathy    Continue lyrica.      Relevant Medications   hydrOXYzine (ATARAX/VISTARIL) 25 MG tablet   Health maintenance examination - Primary    Preventative protocols reviewed and updated unless pt declined. Discussed healthy diet and lifestyle.       Essential hypertension    Chronic, stable. Continue low dose amlodipine and maxzide.       Chronic pain syndrome (Chronic)    Has established with neurosurgery - they suggested she return to see PMR [name]).      Advanced care planning/counseling discussion    Advanced directives: scanned and in  chart 10/2015. HCPOA is daughter then sons. Does not want prolonged measures.           Meds ordered this encounter    Medications  . hydrOXYzine (ATARAX/VISTARIL) 25 MG tablet    Sig: Take 1 tablet (25 mg total) by mouth 2 (two) times daily as needed for itching.    Dispense:  30 tablet    Refill:  0   Orders Placed This Encounter  Procedures  . DG Bone Density    Standing Status:   Future    Standing Expiration Date:   01/17/2019    Order Specific Question:   Reason for Exam (SYMPTOM  OR DIAGNOSIS REQUIRED)    Answer:   osteopenia f/u    Order Specific Question:   Preferred imaging location?    Answer:   [facility]  . Hepatic function panel    Standing Status:   Future    Standing Expiration Date:   11/21/2018  . CBC with Differential/Platelet    Standing Status:   Future    Standing Expiration Date:   11/21/2018  . Ceruloplasmin    Standing Status:   Future    Standing Expiration Date:   11/21/2018  . Hepatitis panel, acute    Standing Status:   Future    Standing Expiration Date:   11/21/2018  . CK    Standing Status:   Future    Standing Expiration Date:   11/21/2018  . ANA    Standing Status:   Future    Standing Expiration Date:   11/22/2018  . Anti-Smooth Muscle Antibody, IGG    Standing Status:   Future    Standing Expiration Date:   11/22/2018  . Alpha-1-Antitrypsin    Standing Status:   Future    Standing Expiration Date:   11/22/2018    Follow up plan: Return in about 6 months (around 05/19/2018) for follow up visit.  Ria Bush, MD

## 2017-11-16 NOTE — Patient Instructions (Addendum)
Call to schedule mammogram at your convenience I will order bone density scan for [facility].  If interested, check with pharmacy about new 2 shot shingles series (shingrix).  One liver function remaining elevated - we may be in touch for further blood work. You are doing well today Return as needed or in 6 months for follow up visit.   Health Maintenance, Female Adopting a healthy lifestyle and getting preventive care can go a long way to promote health and wellness. Talk with your health care provider about what schedule of regular examinations is right for you. This is a good chance for you to check in with your provider about disease prevention and staying healthy. In between checkups, there are plenty of things you can do on your own. Experts have done a lot of research about which lifestyle changes and preventive measures are most likely to keep you healthy. Ask your health care provider for more information. Weight and diet Eat a healthy diet  Be sure to include plenty of vegetables, fruits, low-fat dairy products, and lean protein.  Do not eat a lot of foods high in solid fats, added sugars, or salt.  Get regular exercise. This is one of the most important things you can do for your health. ? Most adults should exercise for at least 150 minutes each week. The exercise should increase your heart rate and make you sweat (moderate-intensity exercise). ? Most adults should also do strengthening exercises at least twice a week. This is in addition to the moderate-intensity exercise.  Maintain a healthy weight  Body mass index (BMI) is a measurement that can be used to identify possible weight problems. It estimates body fat based on height and weight. Your health care provider can help determine your BMI and help you achieve or maintain a healthy weight.  For females 59 years of age and older: ? A BMI below 18.5 is considered underweight. ? A BMI of 18.5 to 24.9 is normal. ? A BMI of 25 to  29.9 is considered overweight. ? A BMI of 30 and above is considered obese.  Watch levels of cholesterol and blood lipids  You should start having your blood tested for lipids and cholesterol at 81 years of age, then have this test every 5 years.  You may need to have your cholesterol levels checked more often if: ? Your lipid or cholesterol levels are high. ? You are older than 81 years of age. ? You are at high risk for heart disease.  Cancer screening Lung Cancer  Lung cancer screening is recommended for adults 19-67 years old who are at high risk for lung cancer because of a history of smoking.  A yearly low-dose CT scan of the lungs is recommended for people who: ? Currently smoke. ? Have quit within the past 15 years. ? Have at least a 30-pack-year history of smoking. A pack year is smoking an average of one pack of cigarettes a day for 1 year.  Yearly screening should continue until it has been 15 years since you quit.  Yearly screening should stop if you develop a health problem that would prevent you from having lung cancer treatment.  Breast Cancer  Practice breast self-awareness. This means understanding how your breasts normally appear and feel.  It also means doing regular breast self-exams. Let your health care provider know about any changes, no matter how small.  If you are in your 20s or 30s, you should have a clinical breast exam (CBE)  by a health care provider every 1-3 years as part of a regular health exam.  If you are 40 or older, have a CBE every year. Also consider having a breast X-ray (mammogram) every year.  If you have a family history of breast cancer, talk to your health care provider about genetic screening.  If you are at high risk for breast cancer, talk to your health care provider about having an MRI and a mammogram every year.  Breast cancer gene (BRCA) assessment is recommended for women who have family members with BRCA-related cancers.  BRCA-related cancers include: ? Breast. ? Ovarian. ? Tubal. ? Peritoneal cancers.  Results of the assessment will determine the need for genetic counseling and BRCA1 and BRCA2 testing.  Cervical Cancer Your health care provider may recommend that you be screened regularly for cancer of the pelvic organs (ovaries, uterus, and vagina). This screening involves a pelvic examination, including checking for microscopic changes to the surface of your cervix (Pap test). You may be encouraged to have this screening done every 3 years, beginning at age 21.  For women ages 30-65, health care providers may recommend pelvic exams and Pap testing every 3 years, or they may recommend the Pap and pelvic exam, combined with testing for human papilloma virus (HPV), every 5 years. Some types of HPV increase your risk of cervical cancer. Testing for HPV may also be done on women of any age with unclear Pap test results.  Other health care providers may not recommend any screening for nonpregnant women who are considered low risk for pelvic cancer and who do not have symptoms. Ask your health care provider if a screening pelvic exam is right for you.  If you have had past treatment for cervical cancer or a condition that could lead to cancer, you need Pap tests and screening for cancer for at least 20 years after your treatment. If Pap tests have been discontinued, your risk factors (such as having a new sexual partner) need to be reassessed to determine if screening should resume. Some women have medical problems that increase the chance of getting cervical cancer. In these cases, your health care provider may recommend more frequent screening and Pap tests.  Colorectal Cancer  This type of cancer can be detected and often prevented.  Routine colorectal cancer screening usually begins at 81 years of age and continues through 81 years of age.  Your health care provider may recommend screening at an earlier age if  you have risk factors for colon cancer.  Your health care provider may also recommend using home test kits to check for hidden blood in the stool.  A small camera at the end of a tube can be used to examine your colon directly (sigmoidoscopy or colonoscopy). This is done to check for the earliest forms of colorectal cancer.  Routine screening usually begins at age 50.  Direct examination of the colon should be repeated every 5-10 years through 81 years of age. However, you may need to be screened more often if early forms of precancerous polyps or small growths are found.  Skin Cancer  Check your skin from head to toe regularly.  Tell your health care provider about any new moles or changes in moles, especially if there is a change in a mole's shape or color.  Also tell your health care provider if you have a mole that is larger than the size of a pencil eraser.  Always use sunscreen. Apply sunscreen liberally and   repeatedly throughout the day.  Protect yourself by wearing long sleeves, pants, a wide-brimmed hat, and sunglasses whenever you are outside.  Heart disease, diabetes, and high blood pressure  High blood pressure causes heart disease and increases the risk of stroke. High blood pressure is more likely to develop in: ? People who have blood pressure in the high end of the normal range (130-139/85-89 mm Hg). ? People who are overweight or obese. ? People who are African American.  If you are 18-39 years of age, have your blood pressure checked every 3-5 years. If you are 40 years of age or older, have your blood pressure checked every year. You should have your blood pressure measured twice-once when you are at a hospital or clinic, and once when you are not at a hospital or clinic. Record the average of the two measurements. To check your blood pressure when you are not at a hospital or clinic, you can use: ? An automated blood pressure machine at a pharmacy. ? A home blood  pressure monitor.  If you are between 55 years and 79 years old, ask your health care provider if you should take aspirin to prevent strokes.  Have regular diabetes screenings. This involves taking a blood sample to check your fasting blood sugar level. ? If you are at a normal weight and have a low risk for diabetes, have this test once every three years after 81 years of age. ? If you are overweight and have a high risk for diabetes, consider being tested at a younger age or more often. Preventing infection Hepatitis B  If you have a higher risk for hepatitis B, you should be screened for this virus. You are considered at high risk for hepatitis B if: ? You were born in a country where hepatitis B is common. Ask your health care provider which countries are considered high risk. ? Your parents were born in a high-risk country, and you have not been immunized against hepatitis B (hepatitis B vaccine). ? You have HIV or AIDS. ? You use needles to inject street drugs. ? You live with someone who has hepatitis B. ? You have had sex with someone who has hepatitis B. ? You get hemodialysis treatment. ? You take certain medicines for conditions, including cancer, organ transplantation, and autoimmune conditions.  Hepatitis C  Blood testing is recommended for: ? Everyone born from 1945 through 1965. ? Anyone with known risk factors for hepatitis C.  Sexually transmitted infections (STIs)  You should be screened for sexually transmitted infections (STIs) including gonorrhea and chlamydia if: ? You are sexually active and are younger than 81 years of age. ? You are older than 81 years of age and your health care provider tells you that you are at risk for this type of infection. ? Your sexual activity has changed since you were last screened and you are at an increased risk for chlamydia or gonorrhea. Ask your health care provider if you are at risk.  If you do not have HIV, but are at risk,  it may be recommended that you take a prescription medicine daily to prevent HIV infection. This is called pre-exposure prophylaxis (PrEP). You are considered at risk if: ? You are sexually active and do not regularly use condoms or know the HIV status of your partner(s). ? You take drugs by injection. ? You are sexually active with a partner who has HIV.  Talk with your health care provider about whether you   are at high risk of being infected with HIV. If you choose to begin PrEP, you should first be tested for HIV. You should then be tested every 3 months for as long as you are taking PrEP. Pregnancy  If you are premenopausal and you may become pregnant, ask your health care provider about preconception counseling.  If you may become pregnant, take 400 to 800 micrograms (mcg) of folic acid every day.  If you want to prevent pregnancy, talk to your health care provider about birth control (contraception). Osteoporosis and menopause  Osteoporosis is a disease in which the bones lose minerals and strength with aging. This can result in serious bone fractures. Your risk for osteoporosis can be identified using a bone density scan.  If you are 56 years of age or older, or if you are at risk for osteoporosis and fractures, ask your health care provider if you should be screened.  Ask your health care provider whether you should take a calcium or vitamin D supplement to lower your risk for osteoporosis.  Menopause may have certain physical symptoms and risks.  Hormone replacement therapy may reduce some of these symptoms and risks. Talk to your health care provider about whether hormone replacement therapy is right for you. Follow these instructions at home:  Schedule regular health, dental, and eye exams.  Stay current with your immunizations.  Do not use any tobacco products including cigarettes, chewing tobacco, or electronic cigarettes.  If you are pregnant, do not drink  alcohol.  If you are breastfeeding, limit how much and how often you drink alcohol.  Limit alcohol intake to no more than 1 drink per day for nonpregnant women. One drink equals 12 ounces of beer, 5 ounces of wine, or 1 ounces of hard liquor.  Do not use street drugs.  Do not share needles.  Ask your health care provider for help if you need support or information about quitting drugs.  Tell your health care provider if you often feel depressed.  Tell your health care provider if you have ever been abused or do not feel safe at home. This information is not intended to replace advice given to you by your health care provider. Make sure you discuss any questions you have with your health care provider. Document Released: 10/07/2010 Document Revised: 08/30/2015 Document Reviewed: 12/26/2014 Elsevier Interactive Patient Education  [employer].

## 2017-11-16 NOTE — Assessment & Plan Note (Signed)
Preventative protocols reviewed and updated unless pt declined. Discussed healthy diet and lifestyle.  

## 2017-11-19 NOTE — Assessment & Plan Note (Signed)
Latest dexa 2013 with T score -1.9. Pt endorses new shrinking size - will update DEXA at [facility].

## 2017-11-19 NOTE — Assessment & Plan Note (Signed)
Reviewed recent labs with patient.  

## 2017-11-20 NOTE — Assessment & Plan Note (Addendum)
Persistently elevated over this past year. Recent abd US showed gallstones otherwise normal (06/2017). Denies alcohol use. Denies abdominal pain.  Consider repeat testing - GGT, ceruloplasmin, viral hepatitis panel. ?Macro-AST

## 2017-11-20 NOTE — Assessment & Plan Note (Signed)
Chronic, stable off cholesterol medication.

## 2017-11-20 NOTE — Assessment & Plan Note (Signed)
Chronic, stable. Continue levothyroxine. TFTs stable.

## 2017-11-20 NOTE — Assessment & Plan Note (Signed)
Continue lyrica 

## 2017-11-20 NOTE — Assessment & Plan Note (Signed)
Has established with neurosurgery - they suggested she return to see PMR [name]).

## 2017-11-20 NOTE — Assessment & Plan Note (Signed)
Chronic, stable. Continue low dose amlodipine and maxzide.

## 2017-11-21 ENCOUNTER — Telehealth: Payer: Self-pay | Admitting: Family Medicine

## 2017-11-21 NOTE — Telephone Encounter (Signed)
plz call patient - I would like her to return this week (doesn't have to be fasting) for repeat labs to further evaluate elevated liver function.

## 2017-11-23 ENCOUNTER — Telehealth: Payer: Self-pay | Admitting: Family Medicine

## 2017-11-23 NOTE — Telephone Encounter (Signed)
Patient notified by telephone and verbalized understanding. Lab appointment scheduled.

## 2017-11-23 NOTE — Telephone Encounter (Signed)
Pt calling to check status on the prior auth being completed with [employer] for her to be able to get the cost of the hydroxyzine lowered. [place] phone #: [phone] option #5 Ref#: [id number]. If they don't hear from the office today they will deny the request and pt cannot try again until 60 days.

## 2017-11-23 NOTE — Telephone Encounter (Signed)
Spoke T[place]ka with [employer] in reference to hydroxyzine rx which is a non-formulary.  Did PA via phn call.  Says she will submit PA and fax decision.

## 2017-11-23 NOTE — Telephone Encounter (Signed)
Left message on vm per dpr informing pt we are waiting to hear back from [facility] with approval/denial of prior authorization for the hydroxyzine.

## 2017-11-23 NOTE — Telephone Encounter (Signed)
Copied from [place] [id number]. Topic: Quick Communication - See Telephone Encounter >> Nov 23, 2017  9:49 AM [name] A wrote: CRM for notification. See Telephone encounter for: 11/23/17. Darryl called for prior authorization for hydrOXYzine (ATARAX/VISTARIL) 25 MG tablet [[id number]]  the medication was rejected.  He has faxed over a prior auth however he has not heard anything.  He is able to accept a verbal auth on the phone with the diag and failed medications.  Please advise [phone] Option #5 Case # [id number]

## 2017-11-24 ENCOUNTER — Other Ambulatory Visit: Payer: Self-pay | Admitting: Family Medicine

## 2017-11-25 ENCOUNTER — Other Ambulatory Visit: Payer: Self-pay | Admitting: Family Medicine

## 2017-11-25 ENCOUNTER — Other Ambulatory Visit (INDEPENDENT_AMBULATORY_CARE_PROVIDER_SITE_OTHER): Payer: Medicare Other

## 2017-11-25 ENCOUNTER — Telehealth: Payer: Self-pay | Admitting: Family Medicine

## 2017-11-25 DIAGNOSIS — R740 Nonspecific elevation of levels of transaminase and lactic acid dehydrogenase [LDH]: Secondary | ICD-10-CM

## 2017-11-25 DIAGNOSIS — R7401 Elevation of levels of liver transaminase levels: Secondary | ICD-10-CM

## 2017-11-25 LAB — CBC WITH DIFFERENTIAL/PLATELET
BASOS PCT: 1 % (ref 0.0–3.0)
Basophils Absolute: 0 10*3/uL (ref 0.0–0.1)
EOS ABS: 0.1 10*3/uL (ref 0.0–0.7)
Eosinophils Relative: 2.1 % (ref 0.0–5.0)
HEMATOCRIT: 39 % (ref 36.0–46.0)
HEMOGLOBIN: 13.2 g/dL (ref 12.0–15.0)
LYMPHS PCT: 41.7 % (ref 12.0–46.0)
Lymphs Abs: 1.6 10*3/uL (ref 0.7–4.0)
MCHC: 33.8 g/dL (ref 30.0–36.0)
MCV: 88 fl (ref 78.0–100.0)
Monocytes Absolute: 0.3 10*3/uL (ref 0.1–1.0)
Monocytes Relative: 8.5 % (ref 3.0–12.0)
Neutro Abs: 1.8 10*3/uL (ref 1.4–7.7)
Neutrophils Relative %: 46.7 % (ref 43.0–77.0)
Platelets: 319 10*3/uL (ref 150.0–400.0)
RBC: 4.43 Mil/uL (ref 3.87–5.11)
RDW: 14.1 % (ref 11.5–15.5)
WBC: 3.8 10*3/uL — AB (ref 4.0–10.5)

## 2017-11-25 LAB — CK: Total CK: 178 U/L — ABNORMAL HIGH (ref 7–177)

## 2017-11-25 LAB — HEPATIC FUNCTION PANEL
ALT: 18 U/L (ref 0–35)
AST: 182 U/L — AB (ref 0–37)
Albumin: 4.3 g/dL (ref 3.5–5.2)
Alkaline Phosphatase: 44 U/L (ref 39–117)
BILIRUBIN DIRECT: 0.1 mg/dL (ref 0.0–0.3)
TOTAL PROTEIN: 7.2 g/dL (ref 6.0–8.3)
Total Bilirubin: 0.5 mg/dL (ref 0.2–1.2)

## 2017-11-25 NOTE — Telephone Encounter (Signed)
Noted.    Spoke with Vinnie Level of [employer] [address] informing her of PA approval.  Verbalizes understanding.

## 2017-11-25 NOTE — Telephone Encounter (Signed)
Noted that Prior Auth. On Hydroxyzine has been rec'd and pharmacy notified today by Medical Assistant in office.  Attempted to call pt.  Left voice message re: the above.  Advised to check with pharmacy. Enc. To call back if further questions.

## 2017-11-25 NOTE — Telephone Encounter (Signed)
Per Nicki Reaper with [employer], insurance approved hydroxyzine till 11/24/18, CB# [id number]

## 2017-11-25 NOTE — Telephone Encounter (Signed)
Copied from [place] [phone]. Topic: Quick Communication - Rx Refill/Question >> Nov 25, 2017  3:01 PM Cecelia Byars, NT wrote: Medication:  hydrOXYzine (ATARAX/VISTARIL) 25 MG tablet   Has the patient contacted their pharmacy? yes   (Agent: If no, request that the patient contact the pharmacy for the refill. (Agent: If yes, when and what did the pharmacy advise?  Preferred Pharmacy (with phone number or street name [facility] [place], [place] - [address] [phone] (Phone) [phone] (Fax)  The pharmacy called and said they need a new prescription to fill this  Agent: Please be advised that RX refills may take up to 3 business days. We ask that you follow-up with your pharmacy.

## 2017-11-28 ENCOUNTER — Encounter: Payer: Self-pay | Admitting: Family Medicine

## 2017-11-28 DIAGNOSIS — R768 Other specified abnormal immunological findings in serum: Secondary | ICD-10-CM | POA: Insufficient documentation

## 2017-11-28 LAB — ANTI-NUCLEAR AB-TITER (ANA TITER): ANA Titer 1: 1:320 {titer} — ABNORMAL HIGH

## 2017-11-28 LAB — HEPATITIS PANEL, ACUTE
Hep A IgM: NONREACTIVE
Hep B C IgM: NONREACTIVE
Hepatitis B Surface Ag: NONREACTIVE
Hepatitis C Ab: NONREACTIVE
SIGNAL TO CUT-OFF: 0.02 (ref <1.00)

## 2017-11-28 LAB — ALPHA-1-ANTITRYPSIN: A-1 Antitrypsin, Ser: 119 mg/dL (ref 83–199)

## 2017-11-28 LAB — ANTI-SMOOTH MUSCLE ANTIBODY, IGG: Actin (Smooth Muscle) Antibody (IGG): <20 U (ref <20)

## 2017-11-28 LAB — CERULOPLASMIN: CERULOPLASMIN: 31 mg/dL (ref 18–53)

## 2017-11-28 LAB — ANA: Anti Nuclear Antibody(ANA): POSITIVE — AB

## 2017-12-02 ENCOUNTER — Telehealth: Payer: Self-pay

## 2017-12-02 DIAGNOSIS — R768 Other specified abnormal immunological findings in serum: Secondary | ICD-10-CM

## 2017-12-02 DIAGNOSIS — R740 Nonspecific elevation of levels of transaminase and lactic acid dehydrogenase [LDH]: Principal | ICD-10-CM

## 2017-12-02 DIAGNOSIS — R7401 Elevation of levels of liver transaminase levels: Secondary | ICD-10-CM

## 2017-12-02 NOTE — Telephone Encounter (Signed)
Copied from [place] [phone]. Topic: Appointment Scheduling - Scheduling Inquiry for Clinic >> Dec 02, 2017 11:04 AM Rutherford Nail, NT wrote: Reason for CRM: Patient calling and would like to get further testing done from her previous visit. States that she would like to know what would be ordered and then someone give her a call and she can look at her schedule and see what works best for her. States that she is running back and forth between her appointments and the hospital. Please advise.

## 2017-12-03 DIAGNOSIS — M8588 Other specified disorders of bone density and structure, other site: Secondary | ICD-10-CM | POA: Diagnosis not present

## 2017-12-03 DIAGNOSIS — N958 Other specified menopausal and perimenopausal disorders: Secondary | ICD-10-CM | POA: Diagnosis not present

## 2017-12-04 NOTE — Telephone Encounter (Signed)
Will do further labwork - looking for autoimmune liver disease. Ordered.

## 2017-12-04 NOTE — Telephone Encounter (Signed)
Spoke with pt relaying Dr. Synthia Innocent message. Verbalizes understanding and expresses her thanks. States she will call back after lunch to scheduled lab visit.

## 2017-12-05 ENCOUNTER — Other Ambulatory Visit: Payer: Self-pay | Admitting: Family Medicine

## 2017-12-08 ENCOUNTER — Other Ambulatory Visit: Payer: Medicare Other

## 2017-12-08 ENCOUNTER — Other Ambulatory Visit (INDEPENDENT_AMBULATORY_CARE_PROVIDER_SITE_OTHER): Payer: Medicare Other

## 2017-12-08 DIAGNOSIS — H16223 Keratoconjunctivitis sicca, not specified as Sjogren's, bilateral: Secondary | ICD-10-CM | POA: Diagnosis not present

## 2017-12-08 DIAGNOSIS — R768 Other specified abnormal immunological findings in serum: Secondary | ICD-10-CM | POA: Diagnosis not present

## 2017-12-08 DIAGNOSIS — R740 Nonspecific elevation of levels of transaminase and lactic acid dehydrogenase [LDH]: Secondary | ICD-10-CM | POA: Diagnosis not present

## 2017-12-08 DIAGNOSIS — R7401 Elevation of levels of liver transaminase levels: Secondary | ICD-10-CM

## 2017-12-09 ENCOUNTER — Encounter: Payer: Self-pay | Admitting: Family Medicine

## 2017-12-09 ENCOUNTER — Telehealth: Payer: Self-pay

## 2017-12-09 NOTE — Telephone Encounter (Signed)
Spoke with pt relaying bone density results are stable and pt is to continue calcium+ vit D and walk as able to, per Dr. Darnell Level.  Pt verbalizes understanding.  Pt wants to make Dr. Darnell Level aware she saw eye doctor recently and was diagnosed with severe chronic dry eyes. Fyi to Dr. Darnell Level.

## 2017-12-10 LAB — SJOGREN'S SYNDROME ANTIBODS(SSA + SSB)
SSA (RO) (ENA) ANTIBODY, IGG: NEGATIVE AI
SSB (La) (ENA) Antibody, IgG: <1 AI

## 2017-12-10 LAB — RHEUMATOID FACTOR

## 2017-12-10 LAB — MITOCHONDRIAL ANTIBODIES: Mitochondrial M2 Ab, IgG: <=20 U

## 2017-12-10 LAB — CYCLIC CITRUL PEPTIDE ANTIBODY, IGG

## 2017-12-10 LAB — IGG: IgG (Immunoglobin G), Serum: 1102 mg/dL (ref 600–1540)

## 2017-12-12 ENCOUNTER — Encounter: Payer: Self-pay | Admitting: Family Medicine

## 2017-12-12 DIAGNOSIS — H04129 Dry eye syndrome of unspecified lacrimal gland: Secondary | ICD-10-CM | POA: Insufficient documentation

## 2017-12-14 ENCOUNTER — Other Ambulatory Visit: Payer: Self-pay | Admitting: Family Medicine

## 2017-12-14 DIAGNOSIS — R7401 Elevation of levels of liver transaminase levels: Secondary | ICD-10-CM

## 2017-12-14 DIAGNOSIS — R740 Nonspecific elevation of levels of transaminase and lactic acid dehydrogenase [LDH]: Principal | ICD-10-CM

## 2017-12-21 ENCOUNTER — Telehealth: Payer: Self-pay | Admitting: Family Medicine

## 2017-12-21 NOTE — Telephone Encounter (Signed)
Copied from [place] [phone]. Topic: Quick Communication - See Telephone Encounter >> Dec 21, 2017  9:56 AM Sheran Luz wrote: CRM for notification. See Telephone encounter for: 12/21/17.  Pt is requesting a physical copy of her last two labs, orders and results sent to  [address]  [place] [address]  Please advise.

## 2017-12-22 DIAGNOSIS — Z1231 Encounter for screening mammogram for malignant neoplasm of breast: Secondary | ICD-10-CM | POA: Diagnosis not present

## 2017-12-22 LAB — HM MAMMOGRAPHY

## 2017-12-22 NOTE — Telephone Encounter (Signed)
Mailed lab results from 9/06/06/17 and 11/25/17 to pt.

## 2017-12-24 ENCOUNTER — Encounter: Payer: Self-pay | Admitting: Family Medicine

## 2017-12-30 DIAGNOSIS — H04123 Dry eye syndrome of bilateral lacrimal glands: Secondary | ICD-10-CM | POA: Diagnosis not present

## 2018-01-01 ENCOUNTER — Telehealth: Payer: Self-pay | Admitting: Family Medicine

## 2018-01-01 NOTE — Telephone Encounter (Signed)
Copied from [place] [phone]. Topic: General - Other >> Jan 01, 2018  2:08 PM Lennox Solders wrote: Reason for CRM: pt is calling to let md know she had tear duct plugs in both eyes on 12-30-17. Dr Edison Pace at [facility] performed the procedure

## 2018-01-01 NOTE — Telephone Encounter (Signed)
Noted. Thanks.

## 2018-01-07 ENCOUNTER — Ambulatory Visit (INDEPENDENT_AMBULATORY_CARE_PROVIDER_SITE_OTHER): Payer: Medicare Other

## 2018-01-07 DIAGNOSIS — Z23 Encounter for immunization: Secondary | ICD-10-CM | POA: Diagnosis not present

## 2018-01-25 ENCOUNTER — Encounter: Payer: Self-pay | Admitting: Gastroenterology

## 2018-01-25 ENCOUNTER — Ambulatory Visit: Payer: Medicare Other | Admitting: Gastroenterology

## 2018-01-25 VITALS — BP 135/81 | HR 69 | Ht 58.5 in | Wt 140.4 lb

## 2018-01-25 DIAGNOSIS — R740 Nonspecific elevation of levels of transaminase and lactic acid dehydrogenase [LDH]: Secondary | ICD-10-CM

## 2018-01-25 DIAGNOSIS — R7401 Elevation of levels of liver transaminase levels: Secondary | ICD-10-CM

## 2018-01-25 NOTE — Progress Notes (Signed)
Jonathon Bellows MD, MRCP(U.K) [address]  [place]  [place],  [address]  Main: [phone]  Fax: [phone]   Gastroenterology Consultation  Referring Provider:     Ria Bush, MD Primary Care Physician:  Ria Bush, MD Primary Gastroenterologist:  Dr. Jonathon Bellows  Reason for Consultation:     Transaminitis        HPI:   Cheyenne Pierce is a 83 y.o. y/o female referred for consultation & management  by Dr. Ria Bush, MD.    She has been referred for transaminitis.  Looking back at her labs I do note that her AST has been predominantly elevated and has been gradually rising over the past 1 year from 113 -->118---> 166 total bilirubin ALT has been normal.  The meniscus were completely normal in 2016 when checked.  Further labs in 12/25/2017: AMA negative, IgG normal, globin 13.2 g platelet count of 319, ceruloplasmin is 31, hepatitis B surface antigen negative, hepatitis C antibody negative, CK elevated at 178, ANA positive, anti-smooth muscle antibody negative, iron studies normal, affect normal.  Right upper quadrant ultrasound in 06/24/2017 showed mobile multiple gallstones measuring up to 5 mm.  Her complaints are swelling of the face, no muscle pains, has arthritis aches, she has pains around her shoulders, has had surgery on her neck . Lot of pain in her lower back. Lots of pain in her hips. No weight loss. No meds for cholesterol . Does not exercise much . No fever. No new meds last one year.   She takes astragalus- for general health- on it for 5 years,  Past Medical History:  Diagnosis Date  . Allergy   . Angina pectoris ([place])   . Anxiety    takes Wellbutrin daily  . Cancer ([place])    basal cell right upper arm; dx and treated 04/2017  . Cataract    left eye,immature  . Chronic back pain    scoliosis/DDD  . DDD (degenerative disc disease), cervical    moderate to severe multilevel cervical spine DDD, most severe central stenosis C5/6,  significant R foraminal stenosis ([name])  . DDD (degenerative disc disease), cervical    throughout spine   . DDD (degenerative disc disease), lumbar    multilevel degenerative changes mostly L3/4 with mod spinal canal and mild bilat neural foraminal stenosis, moderate R neural froaminal narrowing L4/5 ([name])  . Dental bridge present   . Depression   . Diverticulosis   . GERD (gastroesophageal reflux disease)    takes Protonix daily  . Headache   . Heart murmur   . History of bronchitis    last time Spring 2016  . History of colon polyps    benign  . History of gastric ulcer   . History of hiatal hernia   . Hypertension    takes Amlodipine and Maxzide daily  . Hypothyroidism    takes SYnthroid daily  . IBS (irritable bowel syndrome)    diarrhea-takes Immodium as needed  . Insomnia    takes Benadryl nightly  . Joint pain   . Joint swelling   . Mass of vagina    referral to [facility] 03/2014  . Nocturia   . Peripheral neuropathy    in feet, ESI have helped [name])  . Peripheral neuropathy    takes Gabapentin daily  . Urinary frequency   . Urinary urgency   . Vertigo    takes Antivert as needed    Past Surgical History:  Procedure Laterality  Date  . ABDOMINAL HYSTERECTOMY  1968   left ovary remains  . ANTERIOR CERVICAL DECOMPRESSION/DISCECTOMY FUSION 4 LEVELS N/A 06/05/2015   Procedure: ANTERIOR CERVICAL DECOMPRESSION/DISCECTOMY FUSION CERVICAL THREE-FOUR,CERVICAL FOUR-FIVE,CERVICAL FIVE-SIX,CERVICAL SIX-SEVEN;  Surgeon: Earnie Larsson, MD;  Location: [facility];  Service: Neurosurgery;  Laterality: N/A;  right side approach  . APPENDECTOMY  1948  . bladder suspension with vaginal sling    . CARDIAC CATHETERIZATION  09/26/1998  . CATARACT EXTRACTION W/PHACO Left 10/15/2015   Procedure: CATARACT EXTRACTION PHACO AND INTRAOCULAR LENS PLACEMENT (IOC) LEFT EYE;  Surgeon: Ronnell Freshwater, MD;  Location: [facility];  Service: Ophthalmology;   Laterality: Left;  LEFT LEAVE PT LAST  . CATARACT EXTRACTION W/PHACO Right 11/26/2015   Procedure: CATARACT EXTRACTION PHACO AND INTRAOCULAR LENS PLACEMENT (IOC);  Surgeon: Ronnell Freshwater, MD;  Location: [facility];  Service: Ophthalmology;  Laterality: Right;  RIGHT  . COLONOSCOPY  07/2009   [facility]), normal per pt  . COLONOSCOPY  06/2014   int hem, rpt 5 yrs ([name])  . COLOSTOMY W/ RECTOCELE REPAIR  06/2004  . DEXA  2013   T -1.0  . epidural infections    . epidural steroid injections  multiple latest 05/2014, 07/2014   help periph neuropathy [name])  . ESOPHAGOGASTRODUODENOSCOPY    . EYE SURGERY Left 10/15/2015   cataract removal  . HERNIA REPAIR    . NM MYOVIEW LTD  2009   WNL per report  . OVARIAN CYST SURGERY  1963   cyst on ovaries  . TONSILLECTOMY    . US ECHOCARDIOGRAPHY  2009   mild aortic/mitral insuff, ER 38%, mild diastolic dysfunction  . VENTRAL HERNIA REPAIR  08/2008   abdominal wall, lysis of adhesions    Prior to Admission medications   Medication Sig Start Date End Date Taking? Authorizing Provider  acetaminophen (TYLENOL) 500 MG tablet Take 500 mg by mouth every 6 (six) hours as needed.   Yes [provider]  amLODipine (NORVASC) 2.5 MG tablet TAKE 1 TABLET(2.5 MG) BY MOUTH DAILY 12/09/17  Yes Ria Bush, MD  aspirin EC 81 MG tablet Take 81 mg by mouth as needed.   Yes [provider]  ASTRAGALUS PO Take 500 mg by mouth daily.   Yes [provider]  benzonatate (TESSALON) 100 MG capsule take 1 capsule by mouth twice a day if needed for cough 03/06/17  Yes Ria Bush, MD  Biotin 1000 MCG tablet Take 1,000 mcg by mouth daily.   Yes [provider]  calcium-vitamin D (OSCAL WITH D) 500-200 MG-UNIT tablet Take 1 tablet by mouth.   Yes [provider]  diphenhydrAMINE (BENADRYL) 25 mg capsule Take 25 mg by mouth at bedtime.    Yes [provider]  famotidine (PEPCID) 20 MG  tablet Take 1 tablet (20 mg total) by mouth 2 (two) times daily as needed for heartburn or indigestion. 10/23/17  Yes Ria Bush, MD  hyoscyamine (LEVSIN, ANASPAZ) 0.125 MG tablet Take 1 tablet (0.125 mg total) by mouth 2 (two) times daily as needed. 10/16/17  Yes Ria Bush, MD  levothyroxine (SYNTHROID, LEVOTHROID) 75 MCG tablet Take 1 tablet (75 mcg total) by mouth daily. 09/01/17  Yes Ria Bush, MD  loperamide (IMODIUM) 2 MG capsule Take by mouth as needed for diarrhea or loose stools.   Yes [provider]  Magnesium 250 MG TABS Take by mouth daily.   Yes [provider]  meclizine (ANTIVERT) 25 MG tablet take 1 tablet by  mouth three times a day if needed 10/17/15  Yes Ria Bush, MD  nitroGLYCERIN (NITROSTAT) 0.4 MG SL tablet Place 1 tablet (0.4 mg total) under the tongue every 5 (five) minutes as needed. 07/22/17  Yes Minna Merritts, MD  pantoprazole (PROTONIX) 40 MG tablet TAKE 1 TABLET BY MOUTH ONCE DAILY 11/24/17  Yes Ria Bush, MD  pregabalin (LYRICA) 150 MG capsule Take 1 capsule (150 mg total) by mouth 2 (two) times daily. 10/23/17 04/21/18 Yes Ria Bush, MD  triamterene-hydrochlorothiazide [facility]) 37.5-25 MG tablet TAKE 1 TABLET BY MOUTH ONCE DAILY 07/06/17  Yes Ria Bush, MD  hydrOXYzine (ATARAX/VISTARIL) 25 MG tablet Take 1 tablet (25 mg total) by mouth 2 (two) times daily as needed for itching. Patient not taking: Reported on 01/25/2018 11/16/17   Ria Bush, MD    Family History  Problem Relation Age of Onset  . Heart disease Mother   . Stroke Brother        2 strokes  . Hypertension Neg Hx   . Diabetes Neg Hx   . Cancer Neg Hx        breast or colon cancer, no history     Social History   Tobacco Use  . Smoking status: Former Smoker    Packs/day: 1.00    Years: 30.00    Pack years: 30.00  . Smokeless tobacco: Never Used  . Tobacco comment: quit smoking in 1980  Substance Use Topics  .  Alcohol use: No    Alcohol/week: 0.0 standard drinks  . Drug use: No    Allergies as of 01/25/2018 - Review Complete 01/25/2018  Allergen Reaction Noted  . Codeine Nausea Only 04/25/2015  . Penicillins Diarrhea 04/25/2015  . Prednisone Swelling 05/15/2006  . Zoloft [sertraline hcl] Rash 01/07/2012  . Amoxicillin Diarrhea 05/15/2006  . Codeine phosphate Nausea Only 05/15/2006  . Gabapentin Swelling 10/23/2017  . Lactose intolerance (gi) Diarrhea 10/10/2015  . Metronidazole Diarrhea 08/26/2007  . Perindopril erbumine  05/15/2006  . Potassium-containing compounds Other (See Comments) 11/04/2012  . Prozac [fluoxetine hcl] Other (See Comments) 04/28/2013  . Tramadol hcl Diarrhea 05/15/2006  . Oxcarbazepine Rash 05/15/2006    Review of Systems:    All systems reviewed and negative except where noted in HPI.   Physical Exam:  BP 135/81   Pulse 69   Ht 4' 10.5" (1.486 m)   Wt 140 lb 6.4 oz (63.7 kg)   BMI 28.84 kg/m  No LMP recorded. Patient has had a hysterectomy. Psych:  Alert and cooperative. Normal mood and affect. General:   Alert,  Well-developed, well-nourished, pleasant and cooperative in NAD Head:  Normocephalic and atraumatic. Eyes:  Sclera clear, no icterus.   Conjunctiva pink. Ears:  Normal auditory acuity. Nose:  No deformity, discharge, or lesions. Mouth:  No deformity or lesions,oropharynx pink & moist. Neck:  Supple; no masses or thyromegaly. Lungs:  Respirations even and unlabored.  Clear throughout to auscultation.   No wheezes, crackles, or rhonchi. No acute distress. Heart:  Regular rate and rhythm; no murmurs, clicks, rubs, or gallops. Abdomen:  Normal bowel sounds.  No bruits.  Soft, non-tender and non-distended without masses, hepatosplenomegaly or hernias noted.  No guarding or rebound tenderness.    Msk:  Symmetrical without gross deformities. Good, equal movement & strength bilaterally. Pulses:  Normal pulses noted. Extremities:  No clubbing or edema.   No cyanosis. Neurologic:  Alert and oriented x3;  grossly normal neurologically. Skin:  Intact without significant lesions or rashes. No jaundice. Lymph  Nodes:  No significant cervical adenopathy. Psych:  Alert and cooperative. Normal mood and affect.  Imaging Studies: No results found.  Assessment and Plan:   RODNESHA ELIE is a 83 y.o. y/o female has been referred for  transaminitis predominantly an elevated AST which is been ongoing for over a year.  I do note that multiple labs have been performed and the only abnormality seen is a positive ANA and an elevated CK.  It is very likely that the AST elevation is secondary to the CK elevation.  Muscle  can release AST.  She is hepatitis B/C and been negative.  Autoimmune work-up was performed so far is negative.  I will order a few more labs to complete the work-up which are missing and in addition will order, GGT.  If the, GGT is negative and the CK is elevated then it would indicate that the AST is completely related to a muscle issue.  Differentials  to consider at her age would be myositis from medications versus autoimmune conditions like polymyositis or dermatomyositis.  She takes astragulus for general health and my brief literature search suggests it can "stimulate the immune system", I have asked her to stop it to check if in case it is contributing to a myositis. It ca also cause diarrhea and hence would help her diarrhea which she has had for a while.    Plan 1.  Check alpha-1 antitrypsin, CK, GGT, celiac serology, HIV, L KM antibody,  Follow up in 6 weeks   Dr Jonathon Bellows MD,MRCP(U.K)

## 2018-01-26 LAB — HIV ANTIBODY (ROUTINE TESTING W REFLEX): HIV Screen 4th Generation wRfx: NONREACTIVE

## 2018-01-26 LAB — CELIAC DISEASE AB SCREEN W/RFX
ANTIGLIADIN ABS, IGA: 3 U (ref 0–19)
IGA/IMMUNOGLOBULIN A, SERUM: 73 mg/dL (ref 64–422)

## 2018-01-26 LAB — CK: CK TOTAL: 213 U/L — AB (ref 24–173)

## 2018-01-26 LAB — ANTI-MICROSOMAL ANTIBODY LIVER / KIDNEY: LKM1 Ab: 1 Units (ref 0.0–20.0)

## 2018-01-26 LAB — GAMMA GT: GGT: 24 IU/L (ref 0–60)

## 2018-01-26 LAB — ALPHA-1-ANTITRYPSIN: A-1 Antitrypsin: 109 mg/dL (ref 90–200)

## 2018-01-29 DIAGNOSIS — H04123 Dry eye syndrome of bilateral lacrimal glands: Secondary | ICD-10-CM | POA: Diagnosis not present

## 2018-02-03 DIAGNOSIS — H04123 Dry eye syndrome of bilateral lacrimal glands: Secondary | ICD-10-CM | POA: Diagnosis not present

## 2018-02-18 DIAGNOSIS — H04123 Dry eye syndrome of bilateral lacrimal glands: Secondary | ICD-10-CM | POA: Diagnosis not present

## 2018-02-22 ENCOUNTER — Other Ambulatory Visit: Payer: Self-pay | Admitting: Family Medicine

## 2018-02-27 ENCOUNTER — Other Ambulatory Visit: Payer: Self-pay | Admitting: Family Medicine

## 2018-03-01 ENCOUNTER — Telehealth: Payer: Self-pay

## 2018-03-01 DIAGNOSIS — G609 Hereditary and idiopathic neuropathy, unspecified: Secondary | ICD-10-CM

## 2018-03-01 DIAGNOSIS — R438 Other disturbances of smell and taste: Secondary | ICD-10-CM

## 2018-03-01 NOTE — Telephone Encounter (Signed)
It looks like Dr Vicente Males wanted to f/u with patient in December but thought elevated liver test was actually coming muscle, and could be related to astralagus supplement she was taking - has she stopped this?  Liver itself was looking reassuringly normal.

## 2018-03-01 NOTE — Telephone Encounter (Signed)
Spoke with asking about astralagus supplement.  Pt states she has stopped this per Dr. Georgeann Oppenheim instructions.  Pt states her daughter is concerned about a metal taste pt is experiencing.  Says she has well water and there have been some trees cut down in her area.  So her daughter is wanting pt to have heavy metal blood work done.

## 2018-03-01 NOTE — Telephone Encounter (Signed)
Pt said she has seen Dr Danise Mina and Dr Vicente Males and no one has been able to determine why pt has abnormal liver function testing . Pt has not heard from lab results from Dr Vicente Males in 01/2018. I advised pt to contact Dr Georgeann Oppenheim office about lab results. pts daughter advised pt that she should request Dr Danise Mina to perform a heavy metal blood test since pt drinks well water and has a bitter taste in her mouth.pt request cb.

## 2018-03-02 NOTE — Telephone Encounter (Addendum)
Doubt related to heavy metal exposure, but ok to check this - heavy metal panel ordered.

## 2018-03-02 NOTE — Telephone Encounter (Signed)
Spoke with pt relaying Dr. Synthia Innocent message.  Pt scheduled for lab visit tomorrow at 12:00 PM.

## 2018-03-02 NOTE — Addendum Note (Signed)
Addended by: Ria Bush on: 03/02/2018 07:47 AM   Modules accepted: Orders

## 2018-03-03 ENCOUNTER — Other Ambulatory Visit (INDEPENDENT_AMBULATORY_CARE_PROVIDER_SITE_OTHER): Payer: Medicare Other

## 2018-03-03 DIAGNOSIS — G609 Hereditary and idiopathic neuropathy, unspecified: Secondary | ICD-10-CM | POA: Diagnosis not present

## 2018-03-03 DIAGNOSIS — R438 Other disturbances of smell and taste: Secondary | ICD-10-CM | POA: Diagnosis not present

## 2018-03-05 LAB — HEAVY METALS PANEL, BLOOD
Lead: 2 ug/dL (ref <5)
Mercury, B: <5 mcg/L (ref 0–10)

## 2018-03-15 ENCOUNTER — Ambulatory Visit: Payer: Medicare Other | Admitting: Gastroenterology

## 2018-03-15 ENCOUNTER — Encounter: Payer: Self-pay | Admitting: Gastroenterology

## 2018-03-15 VITALS — BP 152/75 | HR 78 | Ht 58.5 in | Wt 138.0 lb

## 2018-03-15 DIAGNOSIS — R740 Nonspecific elevation of levels of transaminase and lactic acid dehydrogenase [LDH]: Secondary | ICD-10-CM

## 2018-03-15 DIAGNOSIS — R7401 Elevation of levels of liver transaminase levels: Secondary | ICD-10-CM

## 2018-03-15 NOTE — Progress Notes (Signed)
Jonathon Bellows MD, MRCP(U.K) [address]  [place]  [place], [place] [address]  Main: [phone]  Fax: [phone]   Primary Care Physician: Ria Bush, MD  Primary Gastroenterologist:  Dr. Jonathon Bellows   Chief Complaint  Patient presents with  . Follow-up    Transaminitis    HPI: Cheyenne Pierce is a 83 y.o. female   Summary of history : She was initially referred and seen on 01/25/2018 for transaminitis.  He had been predominantly elevated had been rising over the past 1 year from 113211 8-1 66.  Total bilirubin and ALT have been normal during this time.  Labs were completely normal in 2016. Further labs in 12/25/2017: AMA negative, IgG normal, globin 13.2 g platelet count of 319, ceruloplasmin is 31, hepatitis B surface antigen negative, hepatitis C antibody negative, CK elevated at 178, ANA positive, anti-smooth muscle antibody negative, iron studies normal- normal.  Right upper quadrant ultrasound in 06/24/2017 showed mobile multiple gallstones measuring up to 5 mm.  Her complaints are swelling of the face, no muscle pains, has arthritis aches, she has pains around her shoulders, has had surgery on her neck . Lot of pain in her lower back. Lots of pain in her hips. No weight loss. No meds for cholesterol . Does not exercise much . No fever. No new meds last one year.   She takes astragalus- for general health- on it for 5 years  Interval history   01/25/2018 to 03/15/2018  01/25/2018: L KM antibody, alpha-1 antitrypsin-normal, CK elevated at 213, GGT normal at 24, celiac serology normal.  HIV nonreactive.   Doing well no new issues     Current Outpatient Medications  Medication Sig Dispense Refill  . acetaminophen (TYLENOL) 500 MG tablet Take 500 mg by mouth every 6 (six) hours as needed.    Marland Kitchen amLODipine (NORVASC) 2.5 MG tablet TAKE 1 TABLET(2.5 MG) BY MOUTH DAILY 90 tablet 3  . aspirin EC 81 MG tablet Take 81 mg by mouth as needed.    . ASTRAGALUS PO  Take 500 mg by mouth daily.    . benzonatate (TESSALON) 100 MG capsule take 1 capsule by mouth twice a day if needed for cough 30 capsule 0  . Biotin 1000 MCG tablet Take 1,000 mcg by mouth daily.    . calcium-vitamin D (OSCAL WITH D) 500-200 MG-UNIT tablet Take 1 tablet by mouth.    . diphenhydrAMINE (BENADRYL) 25 mg capsule Take 25 mg by mouth at bedtime.     . famotidine (PEPCID) 20 MG tablet TAKE 1 TABLET BY MOUTH TWICE A DAY AS NEEDED FOR HEARTBURN OR INDIGESTION 60 tablet 3  . hyoscyamine (LEVSIN, ANASPAZ) 0.125 MG tablet TAKE 1 TABLET(0.125 MG) BY MOUTH TWICE DAILY AS NEEDED 30 tablet 3  . levothyroxine (SYNTHROID, LEVOTHROID) 75 MCG tablet TAKE 1 TABLET(75 MCG) BY MOUTH DAILY 30 tablet 3  . loperamide (IMODIUM) 2 MG capsule Take by mouth as needed for diarrhea or loose stools.    . Magnesium 250 MG TABS Take by mouth daily.    . meclizine (ANTIVERT) 25 MG tablet take 1 tablet by mouth three times a day if needed 30 tablet 1  . nitroGLYCERIN (NITROSTAT) 0.4 MG SL tablet Place 1 tablet (0.4 mg total) under the tongue every 5 (five) minutes as needed. 25 tablet 2  . pantoprazole (PROTONIX) 40 MG tablet TAKE 1 TABLET BY MOUTH ONCE DAILY 30 tablet 11  . pregabalin (LYRICA) 150 MG capsule Take 1 capsule (150  mg total) by mouth 2 (two) times daily. 60 capsule 6  . triamterene-hydrochlorothiazide (MAXZIDE-25) 37.5-25 MG tablet TAKE 1 TABLET BY MOUTH ONCE DAILY 30 tablet 11  . hydrOXYzine (ATARAX/VISTARIL) 25 MG tablet Take 1 tablet (25 mg total) by mouth 2 (two) times daily as needed for itching. (Patient not taking: Reported on 01/25/2018) 30 tablet 0   No current facility-administered medications for this visit.     Allergies as of 03/15/2018 - Review Complete 03/15/2018  Allergen Reaction Noted  . Codeine Nausea Only 04/25/2015  . Penicillins Diarrhea 04/25/2015  . Prednisone Swelling 05/15/2006  . Zoloft [sertraline hcl] Rash 01/07/2012  . Amoxicillin Diarrhea 05/15/2006  . Codeine  phosphate Nausea Only 05/15/2006  . Gabapentin Swelling 10/23/2017  . Lactose intolerance (gi) Diarrhea 10/10/2015  . Metronidazole Diarrhea 08/26/2007  . Perindopril erbumine  05/15/2006  . Potassium-containing compounds Other (See Comments) 11/04/2012  . Prozac [fluoxetine hcl] Other (See Comments) 04/28/2013  . Tramadol hcl Diarrhea 05/15/2006  . Oxcarbazepine Rash 05/15/2006    ROS:  General: Negative for anorexia, weight loss, fever, chills, fatigue, weakness. ENT: Negative for hoarseness, difficulty swallowing , nasal congestion. CV: Negative for chest pain, angina, palpitations, dyspnea on exertion, peripheral edema.  Respiratory: Negative for dyspnea at rest, dyspnea on exertion, cough, sputum, wheezing.  GI: See history of present illness. GU:  Negative for dysuria, hematuria, urinary incontinence, urinary frequency, nocturnal urination.  Endo: Negative for unusual weight change.    Physical Examination:   BP (!) 152/75   Pulse 78   Ht 4' 10.5" (1.486 m)   Wt 138 lb (62.6 kg)   BMI 28.35 kg/m   General: Well-nourished, well-developed in no acute distress.  Eyes: No icterus. Conjunctivae pink. Mouth: Oropharyngeal mucosa moist and pink , no lesions erythema or exudate. Lungs: Clear to auscultation bilaterally. Non-labored. Heart: Regular rate and rhythm, no murmurs rubs or gallops.  Abdomen: Bowel sounds are normal, nontender, nondistended, no hepatosplenomegaly or masses, no abdominal bruits or hernia , no rebound or guarding.   Extremities: No lower extremity edema. No clubbing or deformities. Neuro: Alert and oriented x 3.  Grossly intact. Skin: Warm and dry, no jaundice.   Psych: Alert and cooperative, normal mood and affect.   Imaging Studies: No results found.  Assessment and Plan:   Cheyenne Pierce is a 83 y.o. y/o female for  transaminitis predominantly an elevated AST which is been ongoing for over a year.  Noted to have positive ANA and an elevated  CK.  It is very likely that the AST elevation is secondary to the CK elevation.  Muscle  can release AST.  She is hepatitis B/C and been negative.  Autoimmune work-up far is negative.Since  GGT is negative and the CK is elevated then it would indicate that the AST is completely related to a muscle issue.  Differentials  to consider at her age would be myositis from medications versus autoimmune conditionlike polymyositis or dermatomyositis.  She takes astragulus for general health which has been stopped since last visit. Will recheck labs today and if CK still elevated will need to consult rheumatology.    Plan 1.  Check GGT, CRP, CK, hepatic functions today    Dr Jonathon Bellows  MD,MRCP [facility]) Follow up PRN

## 2018-03-16 LAB — CK: Total CK: 235 U/L — ABNORMAL HIGH (ref 24–173)

## 2018-03-16 LAB — C-REACTIVE PROTEIN: CRP: 2 mg/L (ref 0–10)

## 2018-03-16 LAB — HEPATIC FUNCTION PANEL
ALT: 14 IU/L (ref 0–32)
AST: 186 IU/L — AB (ref 0–40)
Albumin: 4.3 g/dL (ref 3.5–4.7)
Alkaline Phosphatase: 52 IU/L (ref 39–117)
BILIRUBIN TOTAL: 0.3 mg/dL (ref 0.0–1.2)
Bilirubin, Direct: 0.08 mg/dL (ref 0.00–0.40)
Total Protein: 6.5 g/dL (ref 6.0–8.5)

## 2018-03-16 LAB — GAMMA GT: GGT: 22 IU/L (ref 0–60)

## 2018-03-19 ENCOUNTER — Telehealth: Payer: Self-pay | Admitting: Family Medicine

## 2018-03-19 NOTE — Telephone Encounter (Signed)
Pt called office to let [name] know she does not want to be referred to a rheumatologist. Pt is also requesting a pelvic exam to be done due to some pain in her stomach and pelvic area. PT stated she wants a woman to perform the exam. [name] was willing to see her. I scheduled her for 12/16 @ 12pm with [name].

## 2018-03-19 NOTE — Telephone Encounter (Signed)
Noted.  Fyi to Dr. G.  

## 2018-03-22 ENCOUNTER — Encounter: Payer: Self-pay | Admitting: Family Medicine

## 2018-03-22 ENCOUNTER — Ambulatory Visit (INDEPENDENT_AMBULATORY_CARE_PROVIDER_SITE_OTHER): Payer: Medicare Other | Admitting: Family Medicine

## 2018-03-22 VITALS — BP 126/76 | HR 78 | Temp 97.7°F | Ht 58.5 in | Wt 137.0 lb

## 2018-03-22 DIAGNOSIS — N8110 Cystocele, unspecified: Secondary | ICD-10-CM

## 2018-03-22 DIAGNOSIS — R109 Unspecified abdominal pain: Secondary | ICD-10-CM

## 2018-03-22 NOTE — Patient Instructions (Signed)
Good to see you today  I discussed your symptoms and physical findings with Dr. Darnell Level and he thinks it would be a good idea to follow up with your gynecologist.

## 2018-03-22 NOTE — Progress Notes (Signed)
Subjective:    Patient ID: Cheyenne Pierce, female    DOB: [birth year], 83 y.o.   MRN: [id number]  HPI This is an 83 yo female, patient of Dr. Danise Mina, who presents today for pelvic exam. She is requesting pelvic exam due to stomach and pelvic pain. She has history of complete hysterectomy with left ovary removal.  She has constant pain across mid abdomen, feels like "a period." Sleeps well, pain as day goes on. She is very active taking care of her 75 yo partner who has cancer. No dysuria, no change in bowels (has IBS and alternates between constipation and diarrhea), no blood or mucus in bowel movements. Feels bloated by middle of the day. Has tried acetaminophen with little relief. Reports tenderness on abdominal exam for many years. No urinary incontinence. Had abdominal US 06/23/17- showed multiple mobile gallstones, otherwise negative. She has had multiple abdominal surgeries and has known adhesions.  Recent workup at GI for elevated AST. Positive ANA.  She declined rheumatology referral.    Past Medical History:  Diagnosis Date  . Allergy   . Angina pectoris ([place])   . Anxiety    takes Wellbutrin daily  . Cancer ([place])    basal cell right upper arm; dx and treated 04/2017  . Cataract    left eye,immature  . Chronic back pain    scoliosis/DDD  . DDD (degenerative disc disease), cervical    moderate to severe multilevel cervical spine DDD, most severe central stenosis C5/6, significant R foraminal stenosis ([name])  . DDD (degenerative disc disease), cervical    throughout spine   . DDD (degenerative disc disease), lumbar    multilevel degenerative changes mostly L3/4 with mod spinal canal and mild bilat neural foraminal stenosis, moderate R neural froaminal narrowing L4/5 ([name])  . Dental bridge present   . Depression   . Diverticulosis   . GERD (gastroesophageal reflux disease)    takes Protonix daily  . Headache   . Heart murmur   . History of bronchitis    last time  Spring 2016  . History of colon polyps    benign  . History of gastric ulcer   . History of hiatal hernia   . Hypertension    takes Amlodipine and Maxzide daily  . Hypothyroidism    takes SYnthroid daily  . IBS (irritable bowel syndrome)    diarrhea-takes Immodium as needed  . Insomnia    takes Benadryl nightly  . Joint pain   . Joint swelling   . Mass of vagina    referral to [facility] 03/2014  . Nocturia   . Peripheral neuropathy    in feet, ESI have helped [name])  . Peripheral neuropathy    takes Gabapentin daily  . Urinary frequency   . Urinary urgency   . Vertigo    takes Antivert as needed   Past Surgical History:  Procedure Laterality Date  . ABDOMINAL HYSTERECTOMY  1968   left ovary remains  . ANTERIOR CERVICAL DECOMPRESSION/DISCECTOMY FUSION 4 LEVELS N/A 06/05/2015   Procedure: ANTERIOR CERVICAL DECOMPRESSION/DISCECTOMY FUSION CERVICAL THREE-FOUR,CERVICAL FOUR-FIVE,CERVICAL FIVE-SIX,CERVICAL SIX-SEVEN;  Surgeon: Earnie Larsson, MD;  Location: [place] [facility];  Service: Neurosurgery;  Laterality: N/A;  right side approach  . APPENDECTOMY  1948  . bladder suspension with vaginal sling    . CARDIAC CATHETERIZATION  09/26/1998  . CATARACT EXTRACTION W/PHACO Left 10/15/2015   Procedure: CATARACT EXTRACTION PHACO AND INTRAOCULAR LENS PLACEMENT (IOC) LEFT EYE;  Surgeon: Ronnell Freshwater, MD;  Location: [facility];  Service: Ophthalmology;  Laterality: Left;  LEFT LEAVE PT LAST  . CATARACT EXTRACTION W/PHACO Right 11/26/2015   Procedure: CATARACT EXTRACTION PHACO AND INTRAOCULAR LENS PLACEMENT (IOC);  Surgeon: Ronnell Freshwater, MD;  Location: [employer];  Service: Ophthalmology;  Laterality: Right;  RIGHT  . COLONOSCOPY  07/2009   [facility]), normal per pt  . COLONOSCOPY  06/2014   int hem, rpt 5 yrs ([name])  . COLOSTOMY W/ RECTOCELE REPAIR  06/2004  . DEXA  2013   T -1.0  . epidural infections    . epidural steroid injections   multiple latest 05/2014, 07/2014   help periph neuropathy [name])  . ESOPHAGOGASTRODUODENOSCOPY    . EYE SURGERY Left 10/15/2015   cataract removal  . HERNIA REPAIR    . NM MYOVIEW LTD  2009   WNL per report  . OVARIAN CYST SURGERY  1963   cyst on ovaries  . TONSILLECTOMY    . US ECHOCARDIOGRAPHY  2009   mild aortic/mitral insuff, ER 38%, mild diastolic dysfunction  . VENTRAL HERNIA REPAIR  08/2008   abdominal wall, lysis of adhesions   Family History  Problem Relation Age of Onset  . Heart disease Mother   . Stroke Brother        2 strokes  . Hypertension Neg Hx   . Diabetes Neg Hx   . Cancer Neg Hx        breast or colon cancer, no history   Social History   Tobacco Use  . Smoking status: Former Smoker    Packs/day: 1.00    Years: 30.00    Pack years: 30.00  . Smokeless tobacco: Never Used  . Tobacco comment: quit smoking in 1980  Substance Use Topics  . Alcohol use: No    Alcohol/week: 0.0 standard drinks  . Drug use: No      Review of Systems Per HPI    Objective:   Physical Exam Vitals signs reviewed. Exam conducted with a chaperone present.  Constitutional:      Appearance: Normal appearance.  HENT:     Head: Normocephalic and atraumatic.  Cardiovascular:     Rate and Rhythm: Normal rate.  Pulmonary:     Effort: Pulmonary effort is normal.  Abdominal:     General: There is no distension.     Palpations: Abdomen is soft. There is no mass.     Tenderness: There is abdominal tenderness (generalized mid abdomen, left > right). There is no guarding or rebound.     Hernia: No hernia is present.  Genitourinary:    Exam position: Supine.     Pubic Area: No rash.      Labia:        Right: No rash, tenderness or lesion.        Left: No rash, tenderness or lesion.      Uterus: Absent.      Comments: Pale, smooth vaginal walls. ? Cystocele and vaginal wall collapse. Tender to exam.  Skin:    General: Skin is warm and dry.  Neurological:     Mental  Status: She is alert and oriented to person, place, and time.  Psychiatric:        Mood and Affect: Mood normal.        Behavior: Behavior normal.        Thought Content: Thought content normal.        Judgment: Judgment normal.     BP 126/76 (BP  Location: Right Arm, Patient Position: Sitting, Cuff Size: Normal)   Pulse 78   Temp 97.7 F (36.5 C) (Oral)   Ht 4' 10.5" (1.486 m)   Wt 137 lb (62.1 kg)   SpO2 97%   BMI 28.15 kg/m  Wt Readings from Last 3 Encounters:  03/22/18 137 lb (62.1 kg)  03/15/18 138 lb (62.6 kg)  01/25/18 140 lb 6.4 oz (63.7 kg)       Assessment & Plan:  Reviewed with Dr. Darnell Level.   1. Abdominal pain, unspecified abdominal location - this is a chronic concern for patient, unclear etiology but she has had multiple abdominal surgeries and likely has adhesions - continue acetaminophen, can try heat  2. Vaginal wall prolapse - discussed gyn evaluation and she is agreeable. She will call and schedule appointment. Encouraged her to contact office if any difficulty getting appointment - RTC if any worsening of symptoms   [name], FNP-BC  [facility]  03/22/2018 12:45 PM

## 2018-04-22 ENCOUNTER — Telehealth: Payer: Self-pay | Admitting: *Deleted

## 2018-04-22 NOTE — Telephone Encounter (Signed)
Spoke to [place] with [facility] who states they are attempting to complete a tier exception for pt to receive lyrica at a cheaper cost. They are needing a Dx code and medications tried and failed in order to have it approved. They sent over a fax on Monday, and it can be faxed back with the correct information or they may be called directly. pls advise

## 2018-04-22 NOTE — Telephone Encounter (Signed)
Spoke with Almyra Free of [place] asking her if she could fax the info again.  Says she will fax it in the next few mins.

## 2018-04-23 NOTE — Telephone Encounter (Signed)
Noted  

## 2018-04-23 NOTE — Telephone Encounter (Signed)
Form filled and in Lisa's box.  

## 2018-04-23 NOTE — Telephone Encounter (Signed)
Received form.  Placed in Dr. G's box.  °

## 2018-04-23 NOTE — Telephone Encounter (Signed)
Faxed form to [facility], ATTN:  Part D Coverage Determination at [phone].  Phn #:  [id number].

## 2018-04-23 NOTE — Telephone Encounter (Signed)
Thanks

## 2018-04-23 NOTE — Telephone Encounter (Signed)
Spoke to [place] with [employer] pt D, who states that pts urgent tier exception has been approved and valid through 04/24/2019. They will contact pt directly to make them aware.

## 2018-05-14 DIAGNOSIS — H04123 Dry eye syndrome of bilateral lacrimal glands: Secondary | ICD-10-CM | POA: Diagnosis not present

## 2018-05-19 ENCOUNTER — Ambulatory Visit: Payer: Medicare Other | Admitting: Family Medicine

## 2018-05-26 DIAGNOSIS — Z961 Presence of intraocular lens: Secondary | ICD-10-CM | POA: Diagnosis not present

## 2018-05-26 DIAGNOSIS — H11822 Conjunctivochalasis, left eye: Secondary | ICD-10-CM | POA: Diagnosis not present

## 2018-05-26 DIAGNOSIS — H02883 Meibomian gland dysfunction of right eye, unspecified eyelid: Secondary | ICD-10-CM | POA: Diagnosis not present

## 2018-05-26 DIAGNOSIS — H02886 Meibomian gland dysfunction of left eye, unspecified eyelid: Secondary | ICD-10-CM | POA: Diagnosis not present

## 2018-06-16 ENCOUNTER — Other Ambulatory Visit: Payer: Self-pay | Admitting: Family Medicine

## 2018-06-16 DIAGNOSIS — M797 Fibromyalgia: Secondary | ICD-10-CM

## 2018-06-16 NOTE — Telephone Encounter (Signed)
Name of Medication: Lyrica Name of Pharmacy: [facility] Last Fill or Written Date and Quantity: 10/23/17, #60 Last Office Visit and Type: 03/22/18, acute Next Office Visit and Type: 11/18/18, CPE Pt 2 Last Controlled Substance Agreement Date: 10/10/16 Last UDS: 01/29/17

## 2018-06-18 NOTE — Telephone Encounter (Signed)
Eprescribed.

## 2018-06-28 ENCOUNTER — Other Ambulatory Visit: Payer: Self-pay | Admitting: Family Medicine

## 2018-07-27 ENCOUNTER — Other Ambulatory Visit: Payer: Self-pay | Admitting: Family Medicine

## 2018-10-22 ENCOUNTER — Other Ambulatory Visit: Payer: Self-pay | Admitting: Family Medicine

## 2018-10-22 DIAGNOSIS — M797 Fibromyalgia: Secondary | ICD-10-CM

## 2018-10-22 NOTE — Telephone Encounter (Signed)
Name of Medication: Lyrica Name of Pharmacy: [facility] Last Fill or Written Date and Quantity: 09/20/18, #60 Last Office Visit and Type: 03/22/18, acute Next Office Visit and Type: 11/18/18, CPE Pt 2 Last Controlled Substance Agreement Date: none Last UDS: none

## 2018-11-02 ENCOUNTER — Telehealth: Payer: Self-pay | Admitting: Family Medicine

## 2018-11-02 NOTE — Telephone Encounter (Signed)
Patient cancelled her wellness visit and cpx In August.  Patient's boyfriend is in his last days and she's very upset and there's a lot of turmoil going on at home.  Patient said she'll call back to reschedule appointment.

## 2018-11-02 NOTE — Telephone Encounter (Signed)
Noted.  Fyi to Dr. G.  

## 2018-11-08 ENCOUNTER — Ambulatory Visit: Payer: Medicare Other

## 2018-11-18 ENCOUNTER — Encounter: Payer: Medicare Other | Admitting: Family Medicine

## 2018-11-18 ENCOUNTER — Ambulatory Visit: Payer: Medicare Other

## 2018-11-24 DIAGNOSIS — H04123 Dry eye syndrome of bilateral lacrimal glands: Secondary | ICD-10-CM | POA: Diagnosis not present

## 2018-11-25 ENCOUNTER — Other Ambulatory Visit: Payer: Self-pay | Admitting: Family Medicine

## 2018-11-25 DIAGNOSIS — M797 Fibromyalgia: Secondary | ICD-10-CM

## 2018-11-26 NOTE — Telephone Encounter (Signed)
Eprescribed.

## 2018-11-26 NOTE — Telephone Encounter (Signed)
LOV 03/22/2018, future appointment on 01/24/2019. Last filled on 10/22/2018 for #60

## 2018-12-06 ENCOUNTER — Other Ambulatory Visit: Payer: Self-pay | Admitting: Family Medicine

## 2018-12-16 ENCOUNTER — Ambulatory Visit (INDEPENDENT_AMBULATORY_CARE_PROVIDER_SITE_OTHER): Payer: Medicare Other

## 2018-12-16 DIAGNOSIS — Z23 Encounter for immunization: Secondary | ICD-10-CM

## 2018-12-27 ENCOUNTER — Other Ambulatory Visit: Payer: Self-pay | Admitting: Family Medicine

## 2018-12-29 DIAGNOSIS — Z1239 Encounter for other screening for malignant neoplasm of breast: Secondary | ICD-10-CM | POA: Diagnosis not present

## 2018-12-29 DIAGNOSIS — Z1231 Encounter for screening mammogram for malignant neoplasm of breast: Secondary | ICD-10-CM | POA: Diagnosis not present

## 2018-12-29 LAB — HM MAMMOGRAPHY

## 2019-01-17 ENCOUNTER — Other Ambulatory Visit: Payer: Self-pay | Admitting: Family Medicine

## 2019-01-17 DIAGNOSIS — R7303 Prediabetes: Secondary | ICD-10-CM

## 2019-01-17 DIAGNOSIS — I10 Essential (primary) hypertension: Secondary | ICD-10-CM

## 2019-01-17 DIAGNOSIS — E785 Hyperlipidemia, unspecified: Secondary | ICD-10-CM

## 2019-01-17 DIAGNOSIS — R768 Other specified abnormal immunological findings in serum: Secondary | ICD-10-CM

## 2019-01-17 DIAGNOSIS — E039 Hypothyroidism, unspecified: Secondary | ICD-10-CM

## 2019-01-18 ENCOUNTER — Other Ambulatory Visit (INDEPENDENT_AMBULATORY_CARE_PROVIDER_SITE_OTHER): Payer: Medicare Other

## 2019-01-18 DIAGNOSIS — I10 Essential (primary) hypertension: Secondary | ICD-10-CM

## 2019-01-18 DIAGNOSIS — E039 Hypothyroidism, unspecified: Secondary | ICD-10-CM | POA: Diagnosis not present

## 2019-01-18 DIAGNOSIS — R768 Other specified abnormal immunological findings in serum: Secondary | ICD-10-CM

## 2019-01-18 DIAGNOSIS — R7689 Other specified abnormal immunological findings in serum: Secondary | ICD-10-CM

## 2019-01-18 DIAGNOSIS — E785 Hyperlipidemia, unspecified: Secondary | ICD-10-CM | POA: Diagnosis not present

## 2019-01-18 DIAGNOSIS — R7303 Prediabetes: Secondary | ICD-10-CM | POA: Diagnosis not present

## 2019-01-18 LAB — CBC WITH DIFFERENTIAL/PLATELET
Basophils Absolute: 0.1 10*3/uL (ref 0.0–0.1)
Basophils Relative: 1.6 % (ref 0.0–3.0)
Eosinophils Absolute: 0.1 10*3/uL (ref 0.0–0.7)
Eosinophils Relative: 1.6 % (ref 0.0–5.0)
HCT: 39.8 % (ref 36.0–46.0)
Hemoglobin: 13.6 g/dL (ref 12.0–15.0)
Lymphocytes Relative: 41.4 % (ref 12.0–46.0)
Lymphs Abs: 1.5 10*3/uL (ref 0.7–4.0)
MCHC: 34.1 g/dL (ref 30.0–36.0)
MCV: 89.3 fl (ref 78.0–100.0)
Monocytes Absolute: 0.3 10*3/uL (ref 0.1–1.0)
Monocytes Relative: 8.5 % (ref 3.0–12.0)
Neutro Abs: 1.7 10*3/uL (ref 1.4–7.7)
Neutrophils Relative %: 46.9 % (ref 43.0–77.0)
Platelets: 302 10*3/uL (ref 150.0–400.0)
RBC: 4.46 Mil/uL (ref 3.87–5.11)
RDW: 14.5 % (ref 11.5–15.5)
WBC: 3.6 10*3/uL — ABNORMAL LOW (ref 4.0–10.5)

## 2019-01-18 LAB — LIPID PANEL
Cholesterol: 198 mg/dL (ref 0–200)
HDL: 66.5 mg/dL (ref >39.00)
LDL Cholesterol: 106 mg/dL — ABNORMAL HIGH (ref 0–99)
NonHDL: 131.59
Total CHOL/HDL Ratio: 3
Triglycerides: 129 mg/dL (ref 0.0–149.0)
VLDL: 25.8 mg/dL (ref 0.0–40.0)

## 2019-01-18 LAB — TSH: TSH: 0.39 u[IU]/mL (ref 0.35–4.50)

## 2019-01-18 LAB — COMPREHENSIVE METABOLIC PANEL
ALT: 18 U/L (ref 0–35)
AST: 180 U/L — ABNORMAL HIGH (ref 0–37)
Albumin: 4.4 g/dL (ref 3.5–5.2)
Alkaline Phosphatase: 44 U/L (ref 39–117)
BUN: 12 mg/dL (ref 6–23)
CO2: 31 mEq/L (ref 19–32)
Calcium: 10 mg/dL (ref 8.4–10.5)
Chloride: 100 mEq/L (ref 96–112)
Creatinine, Ser: 0.69 mg/dL (ref 0.40–1.20)
GFR: 81.35 mL/min (ref >60.00)
Glucose, Bld: 107 mg/dL — ABNORMAL HIGH (ref 70–99)
Potassium: 3.8 mEq/L (ref 3.5–5.1)
Sodium: 138 mEq/L (ref 135–145)
Total Bilirubin: 0.6 mg/dL (ref 0.2–1.2)
Total Protein: 7.1 g/dL (ref 6.0–8.3)

## 2019-01-18 LAB — CK: Total CK: 226 U/L — ABNORMAL HIGH (ref 7–177)

## 2019-01-18 LAB — MICROALBUMIN / CREATININE URINE RATIO
Creatinine,U: 66.4 mg/dL
Microalb Creat Ratio: 1.1 mg/g (ref 0.0–30.0)
Microalb, Ur: <0.7 mg/dL (ref 0.0–1.9)

## 2019-01-18 LAB — HEMOGLOBIN A1C: Hgb A1c MFr Bld: 6.3 % (ref 4.6–6.5)

## 2019-01-24 ENCOUNTER — Encounter: Payer: Self-pay | Admitting: Family Medicine

## 2019-01-24 ENCOUNTER — Other Ambulatory Visit: Payer: Self-pay

## 2019-01-24 ENCOUNTER — Ambulatory Visit (INDEPENDENT_AMBULATORY_CARE_PROVIDER_SITE_OTHER): Payer: Medicare Other | Admitting: Family Medicine

## 2019-01-24 VITALS — BP 122/76 | HR 71 | Temp 97.9°F | Ht 58.5 in | Wt 135.1 lb

## 2019-01-24 DIAGNOSIS — Z0000 Encounter for general adult medical examination without abnormal findings: Secondary | ICD-10-CM

## 2019-01-24 DIAGNOSIS — E785 Hyperlipidemia, unspecified: Secondary | ICD-10-CM

## 2019-01-24 DIAGNOSIS — R413 Other amnesia: Secondary | ICD-10-CM

## 2019-01-24 DIAGNOSIS — G8929 Other chronic pain: Secondary | ICD-10-CM

## 2019-01-24 DIAGNOSIS — F331 Major depressive disorder, recurrent, moderate: Secondary | ICD-10-CM

## 2019-01-24 DIAGNOSIS — M159 Polyosteoarthritis, unspecified: Secondary | ICD-10-CM

## 2019-01-24 DIAGNOSIS — R61 Generalized hyperhidrosis: Secondary | ICD-10-CM

## 2019-01-24 DIAGNOSIS — K582 Mixed irritable bowel syndrome: Secondary | ICD-10-CM

## 2019-01-24 DIAGNOSIS — M150 Primary generalized (osteo)arthritis: Secondary | ICD-10-CM

## 2019-01-24 DIAGNOSIS — M542 Cervicalgia: Secondary | ICD-10-CM

## 2019-01-24 DIAGNOSIS — M8580 Other specified disorders of bone density and structure, unspecified site: Secondary | ICD-10-CM

## 2019-01-24 DIAGNOSIS — M5416 Radiculopathy, lumbar region: Secondary | ICD-10-CM

## 2019-01-24 DIAGNOSIS — F411 Generalized anxiety disorder: Secondary | ICD-10-CM

## 2019-01-24 DIAGNOSIS — G894 Chronic pain syndrome: Secondary | ICD-10-CM

## 2019-01-24 DIAGNOSIS — R7303 Prediabetes: Secondary | ICD-10-CM

## 2019-01-24 DIAGNOSIS — E039 Hypothyroidism, unspecified: Secondary | ICD-10-CM

## 2019-01-24 DIAGNOSIS — M629 Disorder of muscle, unspecified: Secondary | ICD-10-CM

## 2019-01-24 DIAGNOSIS — Z638 Other specified problems related to primary support group: Secondary | ICD-10-CM | POA: Insufficient documentation

## 2019-01-24 DIAGNOSIS — I10 Essential (primary) hypertension: Secondary | ICD-10-CM

## 2019-01-24 DIAGNOSIS — G609 Hereditary and idiopathic neuropathy, unspecified: Secondary | ICD-10-CM

## 2019-01-24 DIAGNOSIS — M797 Fibromyalgia: Secondary | ICD-10-CM

## 2019-01-24 MED ORDER — AMLODIPINE BESYLATE 2.5 MG PO TABS: 2.5000 mg | ORAL_TABLET | Freq: Every day | ORAL | 3 refills | Status: AC

## 2019-01-24 MED ORDER — DIPHENOXYLATE-ATROPINE 2.5-0.025 MG PO TABS: 1.0000 | ORAL_TABLET | Freq: Every day | ORAL | 0 refills | Status: AC | PRN

## 2019-01-24 MED ORDER — DIAZEPAM 5 MG PO TABS: 2.5000 mg | ORAL_TABLET | Freq: Two times a day (BID) | ORAL | 0 refills | Status: AC | PRN

## 2019-01-24 MED ORDER — PREGABALIN 150 MG PO CAPS: ORAL_CAPSULE | ORAL | 11 refills | Status: DC

## 2019-01-24 MED ORDER — TRIAMTERENE-HCTZ 37.5-25 MG PO TABS: 1.0000 | ORAL_TABLET | Freq: Every day | ORAL | 3 refills | Status: DC

## 2019-01-24 MED ORDER — DULOXETINE HCL 20 MG PO CPEP: 20.0000 mg | ORAL_CAPSULE | Freq: Every day | ORAL | 3 refills | Status: AC

## 2019-01-24 MED ORDER — LEVOTHYROXINE SODIUM 75 MCG PO TABS: ORAL_TABLET | ORAL | 3 refills | Status: AC

## 2019-01-24 MED ORDER — PANTOPRAZOLE SODIUM 40 MG PO TBEC: 40.0000 mg | DELAYED_RELEASE_TABLET | Freq: Every day | ORAL | 3 refills | Status: AC

## 2019-01-24 MED ORDER — HYOSCYAMINE SULFATE 0.125 MG PO TABS: ORAL_TABLET | ORAL | 3 refills | Status: AC

## 2019-01-24 NOTE — Patient Instructions (Addendum)
Valium refilled. Consider cymbalta for mood/pain - 20mg  dose sent to pharmacy.  Consider fall prevention/balance training program - let me know if interested.  If interested, check with pharmacy about new 2 shot shingles series (shingrix).  Try lomotil for diarrhea, use sparingly.  Return in 6 months for follow up visit.   Health Maintenance After Age 82 After age 73, you are at a higher risk for certain long-term diseases and infections as well as injuries from falls. Falls are a major cause of broken bones and head injuries in people who are older than age 40. Getting regular preventive care can help to keep you healthy and well. Preventive care includes getting regular testing and making lifestyle changes as recommended by your health care provider. Talk with your health care provider about:  Which screenings and tests you should have. A screening is a test that checks for a disease when you have no symptoms.  A diet and exercise plan that is right for you. What should I know about screenings and tests to prevent falls? Screening and testing are the best ways to find a health problem early. Early diagnosis and treatment give you the best chance of managing medical conditions that are common after age 2. Certain conditions and lifestyle choices may make you more likely to have a fall. Your health care provider may recommend:  Regular vision checks. Poor vision and conditions such as cataracts can make you more likely to have a fall. If you wear glasses, make sure to get your prescription updated if your vision changes.  Medicine review. Work with your health care provider to regularly review all of the medicines you are taking, including over-the-counter medicines. Ask your health care provider about any side effects that may make you more likely to have a fall. Tell your health care provider if any medicines that you take make you feel dizzy or sleepy.  Osteoporosis screening. Osteoporosis is  a condition that causes the bones to get weaker. This can make the bones weak and cause them to break more easily.  Blood pressure screening. Blood pressure changes and medicines to control blood pressure can make you feel dizzy.  Strength and balance checks. Your health care provider may recommend certain tests to check your strength and balance while standing, walking, or changing positions.  Foot health exam. Foot pain and numbness, as well as not wearing proper footwear, can make you more likely to have a fall.  Depression screening. You may be more likely to have a fall if you have a fear of falling, feel emotionally low, or feel unable to do activities that you used to do.  Alcohol use screening. Using too much alcohol can affect your balance and may make you more likely to have a fall. What actions can I take to lower my risk of falls? General instructions  Talk with your health care provider about your risks for falling. Tell your health care provider if: ? You fall. Be sure to tell your health care provider about all falls, even ones that seem minor. ? You feel dizzy, sleepy, or off-balance.  Take over-the-counter and prescription medicines only as told by your health care provider. These include any supplements.  Eat a healthy diet and maintain a healthy weight. A healthy diet includes low-fat dairy products, low-fat (lean) meats, and fiber from whole grains, beans, and lots of fruits and vegetables. Home safety  Remove any tripping hazards, such as rugs, cords, and clutter.  Install safety equipment  such as grab bars in bathrooms and safety rails on stairs.  Keep rooms and walkways well-lit. Activity   Follow a regular exercise program to stay fit. This will help you maintain your balance. Ask your health care provider what types of exercise are appropriate for you.  If you need a cane or walker, use it as recommended by your health care provider.  Wear supportive shoes  that have nonskid soles. Lifestyle  Do not drink alcohol if your health care provider tells you not to drink.  If you drink alcohol, limit how much you have: ? 0-1 drink a day for women. ? 0-2 drinks a day for men.  Be aware of how much alcohol is in your drink. In the [place] one drink equals one typical bottle of beer (12 oz), one-half glass of wine (5 oz), or one shot of hard liquor (1 oz).  Do not use any products that contain nicotine or tobacco, such as cigarettes and e-cigarettes. If you need help quitting, ask your health care provider. Summary  Having a healthy lifestyle and getting preventive care can help to protect your health and wellness after age 48.  Screening and testing are the best way to find a health problem early and help you avoid having a fall. Early diagnosis and treatment give you the best chance for managing medical conditions that are more common for people who are older than age 47.  Falls are a major cause of broken bones and head injuries in people who are older than age 4. Take precautions to prevent a fall at home.  Work with your health care provider to learn what changes you can make to improve your health and wellness and to prevent falls. This information is not intended to replace advice given to you by your health care provider. Make sure you discuss any questions you have with your health care provider. Document Released: 02/04/2017 Document Revised: 07/15/2018 Document Reviewed: 02/04/2017 Elsevier Patient Education  2020 [employer].

## 2019-01-24 NOTE — Assessment & Plan Note (Signed)
Saw GI who didn't think isolated AST elevation was liver related but rather muscle related. CPK mildly elevated as well. Pt has declined rheumatology referral.

## 2019-01-24 NOTE — Assessment & Plan Note (Signed)
Preventative protocols reviewed and updated unless pt declined. Discussed healthy diet and lifestyle.  

## 2019-01-24 NOTE — Progress Notes (Signed)
This visit was conducted in person.  BP 122/76 (BP Location: Left Arm, Patient Position: Sitting, Cuff Size: Normal)    Pulse 71    Temp 97.9 F (36.6 C) (Temporal)    Ht 4' 10.5" (1.486 m)    Wt 135 lb 2 oz (61.3 kg)    SpO2 97%    BMI 27.76 kg/m    CC: AMW Subjective:    Patient ID: Cheyenne Pierce, female    DOB: September 18, 1934, 84 y.o.   MRN: [id number]  HPI: AAIZA KALMAR is a 84 y.o. female presenting on 01/24/2019 for Medicare Wellness (Requests rx [tiny white pill] for diarrhea.  Provided copy [to keep] of recent mammogram. ) and Night Sweats (C/o night sweats. )   Did not see health advisor this year.   Hearing Screening   125Hz  250Hz  500Hz  1000Hz  2000Hz  3000Hz  4000Hz  6000Hz  8000Hz   Right ear:   20 25 20  25     Left ear:   20 20 20   40    Vision Screening Comments: Last eye exam, 11/2018    Office Visit from 01/24/2019 in [facility]  PHQ-2 Total Score  4      Fall Risk  01/24/2019 11/06/2017 04/28/2017 01/29/2017 10/28/2016  Falls in the past year? 1 No No No No  Number falls in past yr: 1 - - - -  Injury with Fall? 0 - - - -  Comment - - - - -  Risk for fall due to : - - - - -  Risk for fall due to: Comment - - - - -  Follow up - - - - -  Comment - - - - -  Falls - tripped at home. Regularly uses cane. Because of fall she carries life alert system in her purse (daughter got it for her). Discussed possible balance training or fall prevention PT program.   Partner of 20 yrs Mr Corinna Capra is not doing well. Esophageal cancer recurrence. She cares for him. Hospice also involved. Overwhelmed with this, not doing well. Denies SI/HI. Has taken valium 5mg  1/2 tab PRN (had leftover), as well as benadryl and melatonin for sleep.   Has previously declined rheumatology referral.   Preventative: COLONOSCOPY Date: 06/2014 int hem, rpt 5 yrs [facility])  Well woman with OBGYN - 2016 [facility] ([place]). Breast exam with OBGYN. Denies pelvic pain or bleeding.    Mammogram -12/2018 WNL. at [facility]. Dexa - 11/2011 osteopenia T-1.9 hip. Notes shrinking.  DEXA 2019 - osteopenia T -2.2.  Flu yearly Pneumovax 2006. prevnar 2015 Td 2007 zostavax4/2013 shingrix - discussed  Advanced directives: scanned and in chart7/2017. HCPOA is daughter then sons. Does not want prolonged measures.  Seat belt use discussed  Sunscreen use discussed,no changing moles. Saw derm Dr Evorn Gong.  Ex smoker quit 1980 Alcohol - none  Dentist - Q68mo  Eye exam - yearly  Bowel - no constipation. Chronic diarrhea managed with imodium. Asks about lomotil.  Bladder - no incontinence   Daily caffeine use 6 cups every day  Lives with husband  Grown children nearby  Activity: no regular exercise - limited by back pain Diet: good water, fruits daily, some vegetables, avoid fried foods     Relevant past medical, surgical, family and social history reviewed and updated as indicated. Interim medical history since our last visit reviewed. Allergies and medications reviewed and updated. Outpatient Medications Prior to Visit  Medication Sig Dispense Refill   acetaminophen (TYLENOL) 500 MG  tablet Take 500 mg by mouth every 6 (six) hours as needed.     aspirin EC 81 MG tablet Take 81 mg by mouth as needed.     benzonatate (TESSALON) 100 MG capsule take 1 capsule by mouth twice a day if needed for cough 30 capsule 0   Biotin 1000 MCG tablet Take 1,000 mcg by mouth daily.     calcium-vitamin D (OSCAL WITH D) 500-200 MG-UNIT tablet Take 1 tablet by mouth.     diphenhydrAMINE (BENADRYL) 25 mg capsule Take 25 mg by mouth at bedtime.      famotidine (PEPCID) 20 MG tablet TAKE 1 TABLET BY MOUTH TWICE A DAY AS NEEDED FOR HEARTBURN OR INDIGESTION 60 tablet 3   hydrOXYzine (ATARAX/VISTARIL) 25 MG tablet Take 1 tablet (25 mg total) by mouth 2 (two) times daily as needed for itching. 30 tablet 0   loperamide (IMODIUM) 2 MG capsule Take by mouth as needed for diarrhea or loose  stools.     Magnesium 250 MG TABS Take by mouth daily.     meclizine (ANTIVERT) 25 MG tablet take 1 tablet by mouth three times a day if needed 30 tablet 1   nitroGLYCERIN (NITROSTAT) 0.4 MG SL tablet Place 1 tablet (0.4 mg total) under the tongue every 5 (five) minutes as needed. 25 tablet 2   amLODipine (NORVASC) 2.5 MG tablet TAKE 1 TABLET BY MOUTH EVERY DAY 90 tablet 0   hyoscyamine (LEVSIN, ANASPAZ) 0.125 MG tablet TAKE 1 TABLET(0.125 MG) BY MOUTH TWICE DAILY AS NEEDED 30 tablet 3   levothyroxine (SYNTHROID) 75 MCG tablet TAKE 1 TABLET(75 MCG) BY MOUTH DAILY 30 tablet 0   pantoprazole (PROTONIX) 40 MG tablet TAKE 1 TABLET BY MOUTH ONCE DAILY 90 tablet 0   pregabalin (LYRICA) 150 MG capsule TAKE 1 CAPSULE(150 MG) BY MOUTH TWICE DAILY 60 capsule 3   triamterene-hydrochlorothiazide (MAXZIDE-25) 37.5-25 MG tablet TAKE 1 TABLET BY MOUTH EVERY DAY 90 tablet 0   ASTRAGALUS PO Take 500 mg by mouth daily.     No facility-administered medications prior to visit.      Per HPI unless specifically indicated in ROS section below Review of Systems  Constitutional: Negative for activity change, appetite change, chills, fatigue, fever and unexpected weight change.  HENT: Negative for hearing loss.   Eyes: Negative for visual disturbance.  Respiratory: Positive for wheezing. Negative for cough, chest tightness and shortness of breath.   Cardiovascular: Positive for chest pain (attributed to anxiety). Negative for palpitations and leg swelling.  Gastrointestinal: Negative for abdominal distention, abdominal pain, blood in stool, constipation, diarrhea, nausea and vomiting.  Genitourinary: Negative for difficulty urinating and hematuria.  Musculoskeletal: Negative for arthralgias, myalgias and neck pain.  Skin: Negative for rash.  Neurological: Negative for dizziness, seizures, syncope and headaches.  Hematological: Negative for adenopathy. Does not bruise/bleed easily.    Psychiatric/Behavioral: Positive for dysphoric mood. The patient is nervous/anxious.        Caregiver stress   Objective:    BP 122/76 (BP Location: Left Arm, Patient Position: Sitting, Cuff Size: Normal)    Pulse 71    Temp 97.9 F (36.6 C) (Temporal)    Ht 4' 10.5" (1.486 m)    Wt 135 lb 2 oz (61.3 kg)    SpO2 97%    BMI 27.76 kg/m   Wt Readings from Last 3 Encounters:  01/24/19 135 lb 2 oz (61.3 kg)  03/22/18 137 lb (62.1 kg)  03/15/18 138 lb (62.6 kg)  Physical Exam Vitals signs and nursing note reviewed.  Constitutional:      General: She is not in acute distress.    Appearance: Normal appearance. She is well-developed. She is not ill-appearing.  HENT:     Head: Normocephalic and atraumatic.     Right Ear: Hearing, tympanic membrane, ear canal and external ear normal.     Left Ear: Hearing, tympanic membrane, ear canal and external ear normal.     Nose: Nose normal.     Mouth/Throat:     Mouth: Mucous membranes are moist.     Pharynx: Oropharynx is clear. Uvula midline. No posterior oropharyngeal erythema.  Eyes:     General: No scleral icterus.    Conjunctiva/sclera: Conjunctivae normal.     Pupils: Pupils are equal, round, and reactive to light.  Neck:     Musculoskeletal: Normal range of motion and neck supple.     Vascular: No carotid bruit.  Cardiovascular:     Rate and Rhythm: Normal rate and regular rhythm.     Pulses: Normal pulses.          Radial pulses are 2+ on the right side and 2+ on the left side.     Heart sounds: Murmur (2/6 systolic) present.  Pulmonary:     Effort: Pulmonary effort is normal. No respiratory distress.     Breath sounds: Normal breath sounds. No wheezing, rhonchi or rales.     Comments: Mild bibasilar crackles Abdominal:     General: Abdomen is flat. Bowel sounds are normal. There is no distension.     Palpations: Abdomen is soft. There is no mass.     Tenderness: There is no abdominal tenderness. There is no guarding or rebound.      Hernia: No hernia is present.  Musculoskeletal: Normal range of motion.     Right lower leg: No edema.     Left lower leg: No edema.  Lymphadenopathy:     Cervical: No cervical adenopathy.  Skin:    General: Skin is warm and dry.     Findings: No rash.  Neurological:     General: No focal deficit present.     Mental Status: She is alert and oriented to person, place, and time.     Comments:  CN grossly intact, station and gait intact Recall 3/3 Calculation 5/5 [id number]  Psychiatric:        Mood and Affect: Mood normal.        Behavior: Behavior normal.        Thought Content: Thought content normal.        Judgment: Judgment normal.       Results for orders placed or performed in visit on 01/18/19  CBC with Differential/Platelet  Result Value Ref Range   WBC 3.6 (L) 4.0 - 10.5 K/uL   RBC 4.46 3.87 - 5.11 Mil/uL   Hemoglobin 13.6 12.0 - 15.0 g/dL   HCT 39.8 36.0 - 46.0 %   MCV 89.3 78.0 - 100.0 fl   MCHC 34.1 30.0 - 36.0 g/dL   RDW 14.5 11.5 - 15.5 %   Platelets 302.0 150.0 - 400.0 K/uL   Neutrophils Relative % 46.9 43.0 - 77.0 %   Lymphocytes Relative 41.4 12.0 - 46.0 %   Monocytes Relative 8.5 3.0 - 12.0 %   Eosinophils Relative 1.6 0.0 - 5.0 %   Basophils Relative 1.6 0.0 - 3.0 %   Neutro Abs 1.7 1.4 - 7.7 K/uL   Lymphs Abs 1.5 0.7 -  4.0 K/uL   Monocytes Absolute 0.3 0.1 - 1.0 K/uL   Eosinophils Absolute 0.1 0.0 - 0.7 K/uL   Basophils Absolute 0.1 0.0 - 0.1 K/uL  CK  Result Value Ref Range   Total CK 226 (H) 7 - 177 U/L  Microalbumin / creatinine urine ratio  Result Value Ref Range   Microalb, Ur <0.7 0.0 - 1.9 mg/dL   Creatinine,U 66.4 mg/dL   Microalb Creat Ratio 1.1 0.0 - 30.0 mg/g  Hemoglobin A1c  Result Value Ref Range   Hgb A1c MFr Bld 6.3 4.6 - 6.5 %  TSH  Result Value Ref Range   TSH 0.39 0.35 - 4.50 uIU/mL  Comprehensive metabolic panel  Result Value Ref Range   Sodium 138 135 - 145 mEq/L   Potassium 3.8 3.5 - 5.1 mEq/L   Chloride 100 96  - 112 mEq/L   CO2 31 19 - 32 mEq/L   Glucose, Bld 107 (H) 70 - 99 mg/dL   BUN 12 6 - 23 mg/dL   Creatinine, Ser 0.69 0.40 - 1.20 mg/dL   Total Bilirubin 0.6 0.2 - 1.2 mg/dL   Alkaline Phosphatase 44 39 - 117 U/L   AST 180 (H) 0 - 37 U/L   ALT 18 0 - 35 U/L   Total Protein 7.1 6.0 - 8.3 g/dL   Albumin 4.4 3.5 - 5.2 g/dL   Calcium 10.0 8.4 - 10.5 mg/dL   GFR 81.35 >60.00 mL/min  Lipid panel  Result Value Ref Range   Cholesterol 198 0 - 200 mg/dL   Triglycerides 129.0 0.0 - 149.0 mg/dL   HDL 66.50 >39.00 mg/dL   VLDL 25.8 0.0 - 40.0 mg/dL   LDL Cholesterol 106 (H) 0 - 99 mg/dL   Total CHOL/HDL Ratio 3    NonHDL 131.59    Depression screen [facility] 2/9 01/24/2019 11/06/2017 04/28/2017 01/29/2017 10/28/2016  Decreased Interest 2 0 0 0 0  Down, Depressed, Hopeless 2 0 0 0 1  PHQ - 2 Score 4 0 0 0 1  Altered sleeping 2 0 - - -  Tired, decreased energy 3 0 - - -  Change in appetite 1 0 - - -  Feeling bad or failure about yourself  1 0 - - -  Trouble concentrating 1 0 - - -  Moving slowly or fidgety/restless 1 0 - - -  Suicidal thoughts 1 0 - - -  PHQ-9 Score 14 0 - - -  Difficult doing work/chores - Not difficult at all - - -  Some recent data might be hidden    GAD 7 : Generalized Anxiety Score 01/24/2019  Nervous, Anxious, on Edge 3  Control/stop worrying 3  Worry too much - different things 3  Trouble relaxing 2  Restless 1  Easily annoyed or irritable 3  Afraid - awful might happen 3  Total GAD 7 Score 18   Assessment & Plan:   Problem List Items Addressed This Visit    Primary osteoarthritis involving multiple joints (Chronic)   Prediabetes    Encouraged limiting added sugars in diet.       Osteopenia    Reviewed recent DEXA.       Night sweats    Ongoing ?due to high lyrica dose.       Muscle disorder    Saw GI who didn't think isolated AST elevation was liver related but rather muscle related. CPK mildly elevated as well. Pt has declined rheumatology referral.  Memory deficit    None appreciated today.       Medicare annual wellness visit, subsequent - Primary    I have personally reviewed the Medicare Annual Wellness questionnaire and have noted 1. The patient's medical and social history 2. Their use of alcohol, tobacco or illicit drugs 3. Their current medications and supplements 4. The patient's functional ability including ADL's, fall risks, home safety risks and hearing or visual impairment. Cognitive function has been assessed and addressed as indicated.  5. Diet and physical activity 6. Evidence for depression or mood disorders The patients weight, height, BMI have been recorded in the chart. I have made referrals, counseling and provided education to the patient based on review of the above and I have provided the pt with a written personalized care plan for preventive services. Provider list updated.. See scanned questionairre as needed for further documentation. Reviewed preventative protocols and updated unless pt declined.       MDD (major depressive disorder), recurrent episode, moderate ([place])    See above. Start cymbalta.       Relevant Medications   diazepam (VALIUM) 5 MG tablet   DULoxetine (CYMBALTA) 20 MG capsule   Irritable bowel syndrome    Refill hyoscyamine PRN rare use. Pt states diarrhea predominant trouble not responsive to imodium, requests lomotil - will fill small amt to use sparingly. Discussed need to avoid mixing this with lomotil.       Relevant Medications   pantoprazole (PROTONIX) 40 MG tablet   hyoscyamine (LEVSIN) 0.125 MG tablet   diphenoxylate-atropine (LOMOTIL) 2.5-0.025 MG tablet   Hypothyroidism    Chronic, stable. Continue current dose.       Relevant Medications   levothyroxine (SYNTHROID) 75 MCG tablet   HLD (hyperlipidemia)    Chronic, stable off medication.  The ASCVD Risk score Mikey Bussing DC Jr., et al., 2013) failed to calculate for the following reasons:   The 2013 ASCVD risk  score is only valid for ages 53 to 35       Relevant Medications   triamterene-hydrochlorothiazide (MAXZIDE-25) 37.5-25 MG tablet   amLODipine (NORVASC) 2.5 MG tablet   Hereditary and idiopathic peripheral neuropathy    Continue lyrica.       Relevant Medications   pregabalin (LYRICA) 150 MG capsule   diazepam (VALIUM) 5 MG tablet   DULoxetine (CYMBALTA) 20 MG capsule   Health maintenance examination    Preventative protocols reviewed and updated unless pt declined. Discussed healthy diet and lifestyle.       GAD (generalized anxiety disorder)    Start cymbalta as per above. Filled valium 5mg  1/2 tab PRN severe stress/anxiety - pt knows to take sparingly.       Relevant Medications   diazepam (VALIUM) 5 MG tablet   DULoxetine (CYMBALTA) 20 MG capsule   Fibromyalgia (Chronic)    Continues lyrica with benefit. Add cymbalta 20mg  daily.      Relevant Medications   pregabalin (LYRICA) 150 MG capsule   DULoxetine (CYMBALTA) 20 MG capsule   Essential hypertension    Chronic, stable on current regimen - continue.       Relevant Medications   triamterene-hydrochlorothiazide (MAXZIDE-25) 37.5-25 MG tablet   amLODipine (NORVASC) 2.5 MG tablet   Chronic pain syndrome (Chronic)   Relevant Medications   pregabalin (LYRICA) 150 MG capsule   DULoxetine (CYMBALTA) 20 MG capsule   Chronic neck pain (Location of Primary Source of Pain) (Bilateral) (R>L) (Chronic)   Relevant Medications   pregabalin (LYRICA) 150 MG  capsule   DULoxetine (CYMBALTA) 20 MG capsule   Chronic Lumbar Radiculopathy (Left) (Chronic)   Relevant Medications   pregabalin (LYRICA) 150 MG capsule   diazepam (VALIUM) 5 MG tablet   DULoxetine (CYMBALTA) 20 MG capsule   Caregiver role strain    Discussed caregiver burnout and caring for herself. Discussed medication to help. Discussed valium use - benefits from PRN use so will Rx to use sparingly. Discussed risks of benzos. She also desires to try cymbalta for mood  as well as chronic back pain.           Meds ordered this encounter  Medications   triamterene-hydrochlorothiazide (MAXZIDE-25) 37.5-25 MG tablet    Sig: Take 1 tablet by mouth daily.    Dispense:  90 tablet    Refill:  3   amLODipine (NORVASC) 2.5 MG tablet    Sig: Take 1 tablet (2.5 mg total) by mouth daily.    Dispense:  90 tablet    Refill:  3   levothyroxine (SYNTHROID) 75 MCG tablet    Sig: TAKE 1 TABLET(75 MCG) BY MOUTH DAILY    Dispense:  90 tablet    Refill:  3   pantoprazole (PROTONIX) 40 MG tablet    Sig: Take 1 tablet (40 mg total) by mouth daily.    Dispense:  90 tablet    Refill:  3   pregabalin (LYRICA) 150 MG capsule    Sig: TAKE 1 CAPSULE(150 MG) BY MOUTH TWICE DAILY    Dispense:  60 capsule    Refill:  11   hyoscyamine (LEVSIN) 0.125 MG tablet    Sig: TAKE 1 TABLET(0.125 MG) BY MOUTH TWICE DAILY AS NEEDED    Dispense:  30 tablet    Refill:  3   diazepam (VALIUM) 5 MG tablet    Sig: Take 0.5-1 tablets (2.5-5 mg total) by mouth every 12 (twelve) hours as needed for anxiety.    Dispense:  30 tablet    Refill:  0   DULoxetine (CYMBALTA) 20 MG capsule    Sig: Take 1 capsule (20 mg total) by mouth daily.    Dispense:  30 capsule    Refill:  3   diphenoxylate-atropine (LOMOTIL) 2.5-0.025 MG tablet    Sig: Take 1 tablet by mouth daily as needed for diarrhea or loose stools (severe diarrhea).    Dispense:  30 tablet    Refill:  0   No orders of the defined types were placed in this encounter.   Patient instructions: Valium refilled. Consider cymbalta for mood/pain - 20mg  dose sent to pharmacy.  Consider fall prevention/balance training program - let me know if interested.  If interested, check with pharmacy about new 2 shot shingles series (shingrix).  Try lomotil for diarrhea, use sparingly.  Return in 6 months for follow up visit.   Follow up plan: Return in about 6 months (around 07/25/2019) for follow up visit.  Ria Bush, MD

## 2019-01-24 NOTE — Assessment & Plan Note (Signed)
Chronic, stable off medication. The ASCVD Risk score (Goff DC Jr., et al., 2013) failed to calculate for the following reasons:   The 2013 ASCVD risk score is only valid for ages 40 to 79  

## 2019-01-24 NOTE — Assessment & Plan Note (Signed)
Chronic, stable on current regimen - continue. 

## 2019-01-24 NOTE — Assessment & Plan Note (Signed)
Continue lyrica 

## 2019-01-24 NOTE — Assessment & Plan Note (Signed)
None appreciated today.

## 2019-01-24 NOTE — Assessment & Plan Note (Signed)
Chronic, stable. Continue current dose.

## 2019-01-24 NOTE — Assessment & Plan Note (Signed)
Encouraged limiting added sugars in diet.  

## 2019-01-24 NOTE — Assessment & Plan Note (Signed)
See above. Start cymbalta.

## 2019-01-24 NOTE — Assessment & Plan Note (Signed)

## 2019-01-24 NOTE — Assessment & Plan Note (Signed)
Ongoing ?due to high lyrica dose.

## 2019-01-24 NOTE — Assessment & Plan Note (Addendum)
Refill hyoscyamine PRN rare use. Pt states diarrhea predominant trouble not responsive to imodium, requests lomotil - will fill small amt to use sparingly. Discussed need to avoid mixing this with lomotil.

## 2019-01-24 NOTE — Assessment & Plan Note (Signed)
Reviewed recent DEXA.

## 2019-01-24 NOTE — Assessment & Plan Note (Signed)
Start cymbalta as per above. Filled valium 5mg  1/2 tab PRN severe stress/anxiety - pt knows to take sparingly.

## 2019-01-24 NOTE — Assessment & Plan Note (Addendum)
Discussed caregiver burnout and caring for herself. Discussed medication to help. Discussed valium use - benefits from PRN use so will Rx to use sparingly. Discussed risks of benzos. She also desires to try cymbalta for mood as well as chronic back pain.

## 2019-01-24 NOTE — Assessment & Plan Note (Signed)
Continues lyrica with benefit. Add cymbalta 20mg  daily.

## 2019-01-25 ENCOUNTER — Encounter: Payer: Self-pay | Admitting: Family Medicine

## 2019-01-26 ENCOUNTER — Telehealth: Payer: Self-pay

## 2019-01-26 NOTE — Telephone Encounter (Signed)
Submitted PAs for Diazepam and Lomotil through covermymeds. Awaiting response from insurance

## 2019-01-26 NOTE — Telephone Encounter (Signed)
Cheyenne Pierce with [employer] left a voicemail stating that they received a request to cover a non-formulary medication . Cheyenne Pierce stated that they need some additional clinical nformation regarding the PA submitted 1. Does patient have diarrhea? 2. Has patient tried imodium and failed that?  Call back at [phone]

## 2019-01-28 NOTE — Telephone Encounter (Signed)
Noted  

## 2019-01-28 NOTE — Telephone Encounter (Signed)
[employer] medicare rep no name left did leave v/m that lomotil was approved from 01/26/19 - 01/26/20. Pt is aware per v/m.FYI to [place] CMA.

## 2019-01-28 NOTE — Telephone Encounter (Signed)
Spoke with [employer].  Answered all questions for PA.  Says they will fax determination.

## 2019-02-02 NOTE — Telephone Encounter (Addendum)
Received faxed diazepam PA approval valid through 01/26/2020.

## 2019-05-09 ENCOUNTER — Other Ambulatory Visit: Payer: Self-pay | Admitting: Family Medicine

## 2019-05-09 DIAGNOSIS — M797 Fibromyalgia: Secondary | ICD-10-CM

## 2019-05-09 NOTE — Telephone Encounter (Signed)
[place]

## 2019-05-09 NOTE — Telephone Encounter (Signed)
Pharmacy requests refill on:   LAST REFILL: 01/24/19 LAST OV:01/24/19 NEXT OV:07/25/19 PHARMACY:[facility] [address] [place]

## 2019-05-09 NOTE — Telephone Encounter (Signed)
Patient stated that she recently moved to [place]. She called the local [facility] to move her prescriptions and they advised her to call our office about the PREGABALIN. They stated that a script from the doctor would need to be sent to them for the medication   [place]-

## 2019-05-11 MED ORDER — PREGABALIN 150 MG PO CAPS: ORAL_CAPSULE | ORAL | 11 refills | Status: DC

## 2019-05-11 NOTE — Telephone Encounter (Signed)
ERx 

## 2019-06-28 DIAGNOSIS — I10 Essential (primary) hypertension: Secondary | ICD-10-CM | POA: Diagnosis not present

## 2019-06-28 DIAGNOSIS — E039 Hypothyroidism, unspecified: Secondary | ICD-10-CM | POA: Diagnosis not present

## 2019-06-28 DIAGNOSIS — R748 Abnormal levels of other serum enzymes: Secondary | ICD-10-CM | POA: Diagnosis not present

## 2019-06-28 DIAGNOSIS — R1032 Left lower quadrant pain: Secondary | ICD-10-CM | POA: Diagnosis not present

## 2019-06-28 DIAGNOSIS — R1012 Left upper quadrant pain: Secondary | ICD-10-CM | POA: Diagnosis not present

## 2019-06-29 ENCOUNTER — Telehealth: Payer: Self-pay | Admitting: Family Medicine

## 2019-06-29 NOTE — Telephone Encounter (Signed)
Patient called today to cancel appointment on 4/19 She stated you were aware that she had to move closer to her daughter due to partner passing away. She stated that she has several things going on that you are aware of. And wanted to speak with you when she could.

## 2019-06-30 NOTE — Telephone Encounter (Signed)
Called patient back, left message.

## 2019-07-01 NOTE — Telephone Encounter (Addendum)
Spoke with patient.  She got her second Moderna shot Wednesday.  She has moved closer to [place] to live near her daughter. [place] which she [place]. Doing well.  She has had a tough time since partner's passing (Mr Samara Snide).  She is struggling living by herself but appreciates having family nearby.  She wanted me to pass message along to Dr Damita Dunnings that she appreciated his great care of Mr Corinna Capra.

## 2019-07-03 NOTE — Telephone Encounter (Signed)
Thank you for the note.  She is one of the kindest people I have ever met and I was always glad to see her.  I wish her only the best.  It was a privilege to care for Mr. Cheyenne Pierce.  He was a wonderful gentleman.

## 2019-07-12 DIAGNOSIS — R1012 Left upper quadrant pain: Secondary | ICD-10-CM | POA: Diagnosis not present

## 2019-07-19 DIAGNOSIS — R109 Unspecified abdominal pain: Secondary | ICD-10-CM | POA: Diagnosis not present

## 2019-07-19 DIAGNOSIS — R232 Flushing: Secondary | ICD-10-CM | POA: Diagnosis not present

## 2019-07-25 ENCOUNTER — Ambulatory Visit: Payer: Medicare Other | Admitting: Family Medicine

## 2019-08-02 DIAGNOSIS — R1032 Left lower quadrant pain: Secondary | ICD-10-CM | POA: Diagnosis not present

## 2019-08-02 DIAGNOSIS — K582 Mixed irritable bowel syndrome: Secondary | ICD-10-CM | POA: Diagnosis not present

## 2019-08-09 DIAGNOSIS — Z760 Encounter for issue of repeat prescription: Secondary | ICD-10-CM | POA: Diagnosis not present

## 2019-08-09 DIAGNOSIS — G4700 Insomnia, unspecified: Secondary | ICD-10-CM | POA: Diagnosis not present

## 2019-08-09 DIAGNOSIS — N951 Menopausal and female climacteric states: Secondary | ICD-10-CM | POA: Diagnosis not present

## 2019-08-22 ENCOUNTER — Other Ambulatory Visit: Payer: Self-pay | Admitting: Family Medicine

## 2019-08-22 DIAGNOSIS — M797 Fibromyalgia: Secondary | ICD-10-CM

## 2019-08-22 NOTE — Telephone Encounter (Signed)
Plz address in Dr. Synthia Innocent absence.  Name of Medication: [place] Name of Pharmacy: [facility] [place] or Written Date and Quantity: 07/25/19, #60 Last Office Visit and Type: 01/24/19, AWV Next Office Visit and Type: none Last Controlled Substance Agreement Date: none Last UDS: 01/29/17

## 2019-08-23 DIAGNOSIS — R1084 Generalized abdominal pain: Secondary | ICD-10-CM | POA: Diagnosis not present

## 2019-09-22 DIAGNOSIS — K582 Mixed irritable bowel syndrome: Secondary | ICD-10-CM | POA: Diagnosis not present

## 2019-09-22 DIAGNOSIS — R1032 Left lower quadrant pain: Secondary | ICD-10-CM | POA: Diagnosis not present

## 2019-10-19 DIAGNOSIS — E7800 Pure hypercholesterolemia, unspecified: Secondary | ICD-10-CM | POA: Diagnosis not present

## 2019-10-19 DIAGNOSIS — I10 Essential (primary) hypertension: Secondary | ICD-10-CM | POA: Diagnosis not present

## 2019-10-19 DIAGNOSIS — E538 Deficiency of other specified B group vitamins: Secondary | ICD-10-CM | POA: Diagnosis not present

## 2019-10-19 DIAGNOSIS — R5383 Other fatigue: Secondary | ICD-10-CM | POA: Diagnosis not present

## 2019-10-25 DIAGNOSIS — H04129 Dry eye syndrome of unspecified lacrimal gland: Secondary | ICD-10-CM | POA: Diagnosis not present

## 2019-10-25 DIAGNOSIS — Z961 Presence of intraocular lens: Secondary | ICD-10-CM | POA: Diagnosis not present

## 2019-10-25 DIAGNOSIS — H18593 Other hereditary corneal dystrophies, bilateral: Secondary | ICD-10-CM | POA: Diagnosis not present

## 2019-11-09 DIAGNOSIS — R42 Dizziness and giddiness: Secondary | ICD-10-CM | POA: Diagnosis not present

## 2019-11-09 DIAGNOSIS — W19XXXA Unspecified fall, initial encounter: Secondary | ICD-10-CM | POA: Diagnosis not present

## 2019-11-11 DIAGNOSIS — R079 Chest pain, unspecified: Secondary | ICD-10-CM | POA: Diagnosis not present

## 2019-11-11 DIAGNOSIS — R55 Syncope and collapse: Secondary | ICD-10-CM | POA: Diagnosis not present

## 2019-11-25 DIAGNOSIS — R079 Chest pain, unspecified: Secondary | ICD-10-CM | POA: Diagnosis not present

## 2020-01-10 ENCOUNTER — Other Ambulatory Visit: Payer: Self-pay | Admitting: Family Medicine

## 2020-02-28 ENCOUNTER — Other Ambulatory Visit: Payer: Self-pay | Admitting: Family Medicine

## 2020-02-28 NOTE — Telephone Encounter (Signed)
E-scribed refill.  Plz schedule wellness, cpe and lab visits.  

## 2020-02-29 ENCOUNTER — Encounter: Payer: Self-pay | Admitting: Family Medicine

## 2020-02-29 ENCOUNTER — Telehealth: Payer: Self-pay | Admitting: Family Medicine

## 2020-02-29 NOTE — Telephone Encounter (Signed)
Needs MWV, CPE, and labs Called and left vm for the patient to call us back to schedule. EM

## 2020-05-23 ENCOUNTER — Other Ambulatory Visit: Payer: Self-pay | Admitting: Family Medicine

## 2020-08-29 ENCOUNTER — Other Ambulatory Visit: Payer: Self-pay | Admitting: Family Medicine

## 2021-06-18 ENCOUNTER — Telehealth: Payer: Self-pay

## 2021-06-18 NOTE — Telephone Encounter (Signed)
[place] [facility] - Client ?Nonclinical Telephone Record  ?[facility]? ?Client Cheyenne Pierce - Client ?Client Site [place] ?Contact Type Call ?Who Is Calling Patient / Member / Family / Caregiver ?Caller Name Cheyenne Pierce ?Caller Phone Number [phone] ?Call Type Message Only Information Provided ?Reason for Call Returning a Call from the Office ?Initial Comment Caller states she is looking for Dr. Dorna Bloom. She tried to call him back. ?Disp. Time Disposition Final User ?06/17/2021 7:38:11 PM General Information Provided Yes [name] ?Call Closed By: [name] ?Transaction Date/Time: 06/17/2021 7:36:34 PM (ET ?

## 2021-06-18 NOTE — Telephone Encounter (Signed)
Pt said she was returning call from Dr Danise Mina. Pt said she just wanted to say hello and she appreciates the call and also appreciates him as a friend. Pt request cb from Dr Danise Mina when he has time. Pt still living in [place].sending note to Dr Danise Mina. ?
